# Patient Record
Sex: Female | Born: 1950
Health system: Southern US, Community
[De-identification: ages and names within clinical notes are randomized; demographics above are authoritative.]

## PROBLEM LIST (undated history)

## (undated) DIAGNOSIS — D649 Anemia, unspecified: Secondary | ICD-10-CM

## (undated) DIAGNOSIS — Z923 Personal history of irradiation: Secondary | ICD-10-CM

## (undated) DIAGNOSIS — G4730 Sleep apnea, unspecified: Secondary | ICD-10-CM

## (undated) DIAGNOSIS — R0602 Shortness of breath: Secondary | ICD-10-CM

## (undated) DIAGNOSIS — K449 Diaphragmatic hernia without obstruction or gangrene: Secondary | ICD-10-CM

## (undated) DIAGNOSIS — K219 Gastro-esophageal reflux disease without esophagitis: Secondary | ICD-10-CM

## (undated) DIAGNOSIS — J45909 Unspecified asthma, uncomplicated: Secondary | ICD-10-CM

## (undated) DIAGNOSIS — C50919 Malignant neoplasm of unspecified site of unspecified female breast: Secondary | ICD-10-CM

## (undated) DIAGNOSIS — M1990 Unspecified osteoarthritis, unspecified site: Secondary | ICD-10-CM

## (undated) DIAGNOSIS — C801 Malignant (primary) neoplasm, unspecified: Secondary | ICD-10-CM

## (undated) DIAGNOSIS — Z5189 Encounter for other specified aftercare: Secondary | ICD-10-CM

## (undated) DIAGNOSIS — E7800 Pure hypercholesterolemia, unspecified: Secondary | ICD-10-CM

## (undated) DIAGNOSIS — J3489 Other specified disorders of nose and nasal sinuses: Secondary | ICD-10-CM

## (undated) DIAGNOSIS — E559 Vitamin D deficiency, unspecified: Secondary | ICD-10-CM

## (undated) DIAGNOSIS — N189 Chronic kidney disease, unspecified: Secondary | ICD-10-CM

## (undated) DIAGNOSIS — Z79811 Long term (current) use of aromatase inhibitors: Secondary | ICD-10-CM

## (undated) DIAGNOSIS — D509 Iron deficiency anemia, unspecified: Secondary | ICD-10-CM

## (undated) DIAGNOSIS — R9431 Abnormal electrocardiogram [ECG] [EKG]: Secondary | ICD-10-CM

## (undated) DIAGNOSIS — M7989 Other specified soft tissue disorders: Secondary | ICD-10-CM

## (undated) DIAGNOSIS — R001 Bradycardia, unspecified: Secondary | ICD-10-CM

## (undated) DIAGNOSIS — M2550 Pain in unspecified joint: Secondary | ICD-10-CM

## (undated) DIAGNOSIS — I10 Essential (primary) hypertension: Secondary | ICD-10-CM

## (undated) DIAGNOSIS — M549 Dorsalgia, unspecified: Secondary | ICD-10-CM

## (undated) DIAGNOSIS — R609 Edema, unspecified: Secondary | ICD-10-CM

## (undated) DIAGNOSIS — R5383 Other fatigue: Secondary | ICD-10-CM

## (undated) DIAGNOSIS — R0600 Dyspnea, unspecified: Secondary | ICD-10-CM

## (undated) HISTORY — PX: BREAST LUMPECTOMY: SHX2

## (undated) HISTORY — DX: Morbid (severe) obesity due to excess calories: E66.01

## (undated) HISTORY — DX: Shortness of breath: R06.02

## (undated) HISTORY — DX: Dorsalgia, unspecified: M54.9

## (undated) HISTORY — DX: Iron deficiency anemia, unspecified: D50.9

## (undated) HISTORY — DX: Diaphragmatic hernia without obstruction or gangrene: K44.9

## (undated) HISTORY — DX: Sleep apnea, unspecified: G47.30

## (undated) HISTORY — DX: Malignant neoplasm of unspecified site of unspecified female breast: C50.919

## (undated) HISTORY — DX: Other specified soft tissue disorders: M79.89

## (undated) HISTORY — DX: Abnormal electrocardiogram (ECG) (EKG): R94.31

## (undated) HISTORY — DX: Other fatigue: R53.83

## (undated) HISTORY — DX: Unspecified asthma, uncomplicated: J45.909

## (undated) HISTORY — DX: Bradycardia, unspecified: R00.1

## (undated) HISTORY — DX: Pure hypercholesterolemia, unspecified: E78.00

## (undated) HISTORY — DX: Vitamin D deficiency, unspecified: E55.9

## (undated) HISTORY — DX: Anemia, unspecified: D64.9

## (undated) HISTORY — DX: Encounter for other specified aftercare: Z51.89

## (undated) HISTORY — DX: Dyspnea, unspecified: R06.00

## (undated) HISTORY — DX: Pain in unspecified joint: M25.50

## (undated) HISTORY — PX: BREAST EXCISIONAL BIOPSY: SUR124

## (undated) HISTORY — DX: Gastro-esophageal reflux disease without esophagitis: K21.9

## (undated) HISTORY — DX: Edema, unspecified: R60.9

## (undated) HISTORY — DX: Essential (primary) hypertension: I10

---

## 1997-04-28 HISTORY — PX: BREAST LUMPECTOMY: SHX2

## 2000-01-21 ENCOUNTER — Other Ambulatory Visit: Admission: RE | Admit: 2000-01-21 | Discharge: 2000-01-21 | Payer: Self-pay | Admitting: Family Medicine

## 2009-05-24 ENCOUNTER — Encounter: Admission: RE | Admit: 2009-05-24 | Discharge: 2009-05-24 | Payer: Self-pay | Admitting: Internal Medicine

## 2011-04-08 ENCOUNTER — Emergency Department (HOSPITAL_COMMUNITY): Payer: BC Managed Care – PPO

## 2011-04-08 ENCOUNTER — Inpatient Hospital Stay (HOSPITAL_COMMUNITY)
Admission: EM | Admit: 2011-04-08 | Discharge: 2011-04-11 | DRG: 247 | Disposition: A | Discharge status: Home / Self Care | Payer: BC Managed Care – PPO | Attending: Internal Medicine | Admitting: Internal Medicine

## 2011-04-08 DIAGNOSIS — I517 Cardiomegaly: Secondary | ICD-10-CM

## 2011-04-08 DIAGNOSIS — K449 Diaphragmatic hernia without obstruction or gangrene: Secondary | ICD-10-CM | POA: Diagnosis present

## 2011-04-08 DIAGNOSIS — R079 Chest pain, unspecified: Secondary | ICD-10-CM

## 2011-04-08 DIAGNOSIS — I498 Other specified cardiac arrhythmias: Secondary | ICD-10-CM | POA: Diagnosis present

## 2011-04-08 DIAGNOSIS — E119 Type 2 diabetes mellitus without complications: Secondary | ICD-10-CM | POA: Diagnosis present

## 2011-04-08 DIAGNOSIS — K219 Gastro-esophageal reflux disease without esophagitis: Secondary | ICD-10-CM | POA: Diagnosis present

## 2011-04-08 DIAGNOSIS — M25519 Pain in unspecified shoulder: Principal | ICD-10-CM | POA: Diagnosis present

## 2011-04-08 DIAGNOSIS — I10 Essential (primary) hypertension: Secondary | ICD-10-CM | POA: Diagnosis present

## 2011-04-08 DIAGNOSIS — D509 Iron deficiency anemia, unspecified: Secondary | ICD-10-CM | POA: Diagnosis present

## 2011-04-08 LAB — CBC
MCH: 17.3 pg — ABNORMAL LOW (ref 26.0–34.0)
MCHC: 28.1 g/dL — ABNORMAL LOW (ref 30.0–36.0)
Platelets: 357 10*3/uL (ref 150–400)
RDW: 20.4 % — ABNORMAL HIGH (ref 11.5–15.5)

## 2011-04-08 LAB — CARDIAC PANEL(CRET KIN+CKTOT+MB+TROPI)
CK, MB: 2.3 ng/mL (ref 0.3–4.0)
Total CK: 126 U/L (ref 7–177)
Troponin I: <0.3 ng/mL (ref <0.30)

## 2011-04-08 LAB — DIFFERENTIAL
Basophils Absolute: 0 10*3/uL (ref 0.0–0.1)
Lymphs Abs: 0.9 10*3/uL (ref 0.7–4.0)
Monocytes Absolute: 0.5 10*3/uL (ref 0.1–1.0)
Neutrophils Relative %: 85 % — ABNORMAL HIGH (ref 43–77)

## 2011-04-08 LAB — COMPREHENSIVE METABOLIC PANEL
ALT: 14 U/L (ref 0–35)
Alkaline Phosphatase: 63 U/L (ref 39–117)
BUN: 19 mg/dL (ref 6–23)
CO2: 28 mEq/L (ref 19–32)
Calcium: 9 mg/dL (ref 8.4–10.5)
GFR calc Af Amer: >60 mL/min (ref >60)
GFR calc non Af Amer: 51 mL/min — ABNORMAL LOW (ref >60)
Glucose, Bld: 142 mg/dL — ABNORMAL HIGH (ref 70–99)
Sodium: 138 mEq/L (ref 135–145)
Total Protein: 7.2 g/dL (ref 6.0–8.3)

## 2011-04-08 LAB — HEMOGLOBIN: Hemoglobin: 7.2 g/dL — ABNORMAL LOW (ref 12.0–15.0)

## 2011-04-08 LAB — PROTIME-INR: Prothrombin Time: 14.1 seconds (ref 11.6–15.2)

## 2011-04-08 LAB — CK TOTAL AND CKMB (NOT AT ARMC)
CK, MB: 2.3 ng/mL (ref 0.3–4.0)
Total CK: 132 U/L (ref 7–177)

## 2011-04-08 LAB — OCCULT BLOOD, POC DEVICE: Fecal Occult Bld: NEGATIVE

## 2011-04-09 LAB — BASIC METABOLIC PANEL
BUN: 15 mg/dL (ref 6–23)
Chloride: 102 mEq/L (ref 96–112)
GFR calc Af Amer: >60 mL/min (ref >60)
Potassium: 3.8 mEq/L (ref 3.5–5.1)

## 2011-04-09 LAB — CARDIAC PANEL(CRET KIN+CKTOT+MB+TROPI)
Relative Index: 1.6 (ref 0.0–2.5)
Troponin I: <0.3 ng/mL (ref <0.30)

## 2011-04-09 LAB — IRON AND TIBC
Saturation Ratios: 3 % — ABNORMAL LOW (ref 20–55)
TIBC: 401 ug/dL (ref 250–470)
UIBC: 390 ug/dL

## 2011-04-09 LAB — TSH: TSH: 1.06 u[IU]/mL (ref 0.350–4.500)

## 2011-04-09 LAB — LIPID PANEL
Cholesterol: 172 mg/dL (ref 0–200)
Total CHOL/HDL Ratio: 3.4 RATIO
Triglycerides: 76 mg/dL (ref <150)
VLDL: 15 mg/dL (ref 0–40)

## 2011-04-09 LAB — CBC
Hemoglobin: 9.6 g/dL — ABNORMAL LOW (ref 12.0–15.0)
MCH: 19.7 pg — ABNORMAL LOW (ref 26.0–34.0)
MCHC: 30.7 g/dL (ref 30.0–36.0)

## 2011-04-09 LAB — GLUCOSE, CAPILLARY: Glucose-Capillary: 152 mg/dL — ABNORMAL HIGH (ref 70–99)

## 2011-04-09 LAB — HEMOGLOBIN A1C: Mean Plasma Glucose: 157 mg/dL — ABNORMAL HIGH (ref <117)

## 2011-04-10 LAB — CROSSMATCH: Unit division: 0

## 2011-04-10 LAB — HEMOGLOBINOPATHY EVALUATION
Hgb A2 Quant: 2 % — ABNORMAL LOW (ref 2.2–3.2)
Hgb F Quant: 0 % (ref 0.0–2.0)
Hgb S Quant: 0 % (ref 0.0–0.0)

## 2011-04-10 LAB — CBC
Hemoglobin: 9.2 g/dL — ABNORMAL LOW (ref 12.0–15.0)
MCH: 19.6 pg — ABNORMAL LOW (ref 26.0–34.0)
Platelets: 304 10*3/uL (ref 150–400)
RBC: 4.69 MIL/uL (ref 3.87–5.11)
WBC: 6.8 10*3/uL (ref 4.0–10.5)

## 2011-04-10 LAB — GLUCOSE, CAPILLARY
Glucose-Capillary: 99 mg/dL (ref 70–99)
Glucose-Capillary: 103 mg/dL — ABNORMAL HIGH (ref 70–99)

## 2011-04-11 LAB — CBC
HCT: 31.7 % — ABNORMAL LOW (ref 36.0–46.0)
Hemoglobin: 9.4 g/dL — ABNORMAL LOW (ref 12.0–15.0)
MCV: 64.3 fL — ABNORMAL LOW (ref 78.0–100.0)
RBC: 4.93 MIL/uL (ref 3.87–5.11)
RDW: 23.8 % — ABNORMAL HIGH (ref 11.5–15.5)
WBC: 6.7 10*3/uL (ref 4.0–10.5)

## 2011-04-11 LAB — GLUCOSE, CAPILLARY: Glucose-Capillary: 95 mg/dL (ref 70–99)

## 2011-04-11 LAB — COMPREHENSIVE METABOLIC PANEL
Albumin: 2.9 g/dL — ABNORMAL LOW (ref 3.5–5.2)
Alkaline Phosphatase: 61 U/L (ref 39–117)
BUN: 12 mg/dL (ref 6–23)
CO2: 27 mEq/L (ref 19–32)
Chloride: 102 mEq/L (ref 96–112)
Creatinine, Ser: 1.09 mg/dL (ref 0.50–1.10)
GFR calc Af Amer: >60 mL/min (ref >60)
GFR calc non Af Amer: 51 mL/min — ABNORMAL LOW (ref >60)
Glucose, Bld: 95 mg/dL (ref 70–99)
Potassium: 3.6 mEq/L (ref 3.5–5.1)
Total Bilirubin: 0.3 mg/dL (ref 0.3–1.2)

## 2011-04-11 NOTE — Discharge Summary (Signed)
NAMESYDNE, KRAHL NO.:  [id number]  MEDICAL RECORD NO.:  [id number]  LOCATION:  1440                         FACILITY:  [facility]  PHYSICIAN:  Andreas Blower, MD       DATE OF BIRTH:  May 30, 1951  DATE OF ADMISSION:  04/08/2011 DATE OF DISCHARGE:                              DISCHARGE SUMMARY   PRIMARY CARE PHYSICIAN:  Robyn N. Allyne Gee, M.D.  CARDIOLOGIST:  Arturo Morton. Riley Kill, MD, [facility]  GASTROENTEROLOGIST:  Llana Aliment. Randa Evens, M.D.  DISCHARGE DIAGNOSES: 1. Left shoulder pain, likely musculoskeletal. 2. Chest pain. 3. Bradycardia. 4. Iron-deficiency anemia. 5. Large hiatal hernia. 6. Type 2 diabetes. 7. Hypertension. 8. Morbid obesity. 9. Gastroesophageal reflux disease.  DISCHARGE MEDICATIONS: 1. Aspirin 81 mg p.o. daily 2. Iron complex 150 mg p.o. twice daily. 3. Pioglitazone/metformin 15/850 mg 1 tablet p.o. twice daily. 4. Amlodipine/benazepril 10/20 p.o. daily. 5. Hydrochlorothiazide 25 mg p.o. daily. 6. Mucinex 1 tablet p.o. q.4h. as needed. 7. Lansoprazole 50 mg p.o. daily. 8. Zyrtec 10 mg p.o. daily as needed.  BRIEF ADMITTING HISTORY AND PHYSICAL:  Ms. Melamed is a 60 year old African-American female with history of diabetes, hypertension, who presented with complaints of left shoulder pain and was found to be profoundly anemic.  It was thought that the patient's left shoulder pain was probable anginal equivalent.  RADIOLOGY AND IMAGING: 1. The patient had chest x-ray two-view which showed enlarged cardiac     silhouette.  Perihilar and bibasilar heterogenous opacities,     probably atelectasis.  Possible hiatal hernia. 2. The patient had a 2-D echocardiogram which showed left ventricle     cavity size was normal, wall thickness was normal, systolic     function was normal with ejection fraction of 55% to 60%.  Wall     motion was normal.  There were no regional wall motion     abnormalities.  Doppler parameters were consistent with  abnormal     left ventricular relaxation (grade 1 diastolic dysfunction).     Aortic valve showed no stenosis.  Mitral valve showed calcified     annulus.  No significant regurgitation.  Left atrium was mildly     dilated.  LABORATORY DATA:  CBC shows a white count of white count of 6.7, hemoglobin 9.4, hematocrit 31.7, platelet count 286,000.  Electrolytes normal with a BUN of 12, creatinine 1.09.  Liver function tests normal. Albumin is 2.9.  Troponins negative x3.  Hemoglobin A1c 7.1, LDL 106. Serum iron 11, ferritin 6.  Fecal occult was negative.  HOSPITAL COURSE BY PROBLEM: 1. Left shoulder pain, likely musculoskeletal, resolved during the     course of hospital stay. 2. Chest pain, thought to be anginal equivalent from the left shoulder     pain, resolved during the course of hospital stay.  The patient was     ruled out for acute coronary syndrome.  [facility]     evaluated the patient during the course of hospital stay.     Recommended that the patient could follow up with Dr. Riley Kill with     [facility] as an outpatient for consideration of possible     stress test.  3. Bradycardia during the course of the night, it was noted that the     patient had a sinus bradycardia while sleeping, likely driven by     sleep apnea.  No further therapy or workup recommended at this     time by cardiology.  The patient may benefit from outpatient     sleep study.  Will help try to arrange for that as outpatient.     If not, will defer to her primary care physician to help arrange     for the sleep study. 4. Morbid obesity.  Diet and exercise as an outpatient.  Will get     outpatient sleep study done. 5. Iron-deficiency anemia.  Will start the patient on supplemental     iron.  The patient's iron-deficiency anemia is likely due to hiatal     hernia, which is causing poor absorption of iron.  Dr. Randa Evens from     GI recommended giving her IV iron.  The patient was given  Feraheme     150 mg IV x1 on April 10, 2011.  The patient was instructed to     follow up with Dr. Randa Evens for another round of IV iron to be done     as an outpatient.  Given the severe iron-deficiency anemia, the     patient received 2 units of PRBC.  She also had EGD and colonoscopy     which showed hiatal hernia and colonoscopy was normal. 6. Type 2 diabetes.  The patient will resume home medications at     discharge and will be on a diabetic diet. 7. GERD.  We will continue the patient on PPI schedule.  DISPOSITION AND FOLLOWUP:  The patient to follow up with Dr. Riley Kill, Cardiology, in 1 week for consideration of possible outpatient stress test.  The patient to follow with Dr. Randa Evens in 1 to 2 weeks for further management of anemia and to receive IV iron.  Time spent on discharge, talking to the patient, coordinating care was 25 minutes.   Andreas Blower, MD   SR/MEDQ  D:  04/11/2011  T:  04/11/2011  Job:  [id number]  Electronically Signed by Wardell Heath Akyla Vavrek  on 04/11/2011 03:40:26 PM

## 2011-04-15 ENCOUNTER — Telehealth: Payer: Self-pay | Admitting: Cardiology

## 2011-04-15 NOTE — Telephone Encounter (Signed)
Patient states she was seen in the ER last week, Dr. Riley Kill suggested then for pt to have a stress test. Patient would like to know when Md wants for her to have it done.

## 2011-04-15 NOTE — Telephone Encounter (Signed)
Pt seen in er last week, said dr Riley Kill suggested a stress test, wants to know when he wants it?

## 2011-04-15 NOTE — Telephone Encounter (Signed)
Dr Riley Kill and [name]--ms steveson is new pt dr Riley Kill saw in consult in hosp--d/c summary states f/u in 1 week with dr Riley Kill and probable out patient stress test --could not find spot for o.v. until 8/6--is this soon enough?--pt cannot come on tues or wed--if problem please let me know--thanks nt

## 2011-04-16 NOTE — Telephone Encounter (Signed)
Spoke with patient, she is feeling fine. She has an appointment to see Dr. Riley Kill 05/04/11. Advised her to contact our office if she has any symptoms before then.

## 2011-04-22 ENCOUNTER — Ambulatory Visit: Payer: BC Managed Care – PPO | Admitting: Cardiology

## 2011-04-25 NOTE — H&P (Signed)
NAME:  [name], LOS NO.:  [id number]  MEDICAL RECORD NO.:  [id number]  LOCATION:  WLED                         FACILITY:  [facility]  PHYSICIAN:  Houston Siren, MD           DATE OF BIRTH:  Jul 01, 1951  DATE OF ADMISSION:  04/08/2011 DATE OF DISCHARGE:                             HISTORY & PHYSICAL   PRIMARY CARE PHYSICIAN:  Merlene Laughter. Renae Gloss, MD  ADVANCE DIRECTIVES:  Full code.  REASON FOR ADMISSION:  Left shoulder pain, possibly angina equivalent, and profound anemia.  HISTORY OF PRESENT ILLNESS:  This is a 60 year old female with history of diabetes, hypertension, but otherwise healthy,  presents to the emergency room with 2 days of left shoulder pain, nausea, and diaphoresis.  She stated movement of her left shoulder did not aggravate or improve her pain.  She specifically denies any substernal chest pain, fever or chills.  There has been no reported trauma to her shoulder and this has not been a chronic problem.  Evaluation in the emergency room showed EKG without any acute ST-T changes and negative cardiac markers. She was incidentally found to have microcytic anemia with hemoglobin of 7.3 g/dL.  Her chest x-ray showed cardiomegaly.  It should be noted that she is menopausal, had no nosebleed or hemorrhoidal bleed.  She has no bright red blood per rectum.  She never had a colonoscopy.  She further has normal white count and normal platelet count.  She did admit to having some dark stool but not to the melanotic degree.  She denied being on aspirin or Pepto-Bismol.  Her creatinine is normal as well at 1.09.  She was seen in consultation with Cardiology who deferred to Medicine admission because of her anemia.  She also has spontaneous resolution of her left shoulder pain at this time.  She has no abdominal pain or epigastric pain.  PAST MEDICAL HISTORY:  Hypertension, diabetes, obesity.  ALLERGIES:  Unclear.  CURRENT MEDICATIONS:  Actos and  Norvasc.  FAMILY HISTORY:  Significant for father with MI at age 76.  Negative for any family history of colon cancer.  SOCIAL HISTORY:  She works in [employer].  She is married and has a child.  PHYSICAL EXAMINATION:  VITAL SIGNS:  Blood pressure 128/75, heart rate of 90, respiratory rate 22, temperature 98.0. GENERAL:  She is alert and oriented and is in no apparent distress. HEENT AND NECK:  Sclerae are nonicteric.  Her subconjunctiva is pale, consistent with true anemia.  Pupils equal, round, reactive.  Neck is supple.  Throat is clear.  Tongue is midline.  Uvula elevates with phonation.  She has no carotid bruit. HEART:  S1, S2 regular.  I did not hear any murmur, rub or gallop. LUNGS:  Clear.  No evidence of consolidation or heart failure. ABDOMEN:  Soft, obese, nondistended, nontender.  No right upper quadrant pain.  Bowel sounds present.  Difficult to evaluate organomegaly. EXTREMITIES:  No edema.  Good distal pulses bilaterally.  No calf tenderness.  No peripheral stigmata of liver disease.  NEUROLOGICAL and PSYCHIATRIC:  Unremarkable.  OBJECTIVE FINDINGS:  EKG showed normal sinus rhythm with left  axis deviation, no acute ST-T changes.  Chest x-ray showed enlarged cardiac silhouette but otherwise negative. Creatinine of 1.09.  Blood glucose of 142.  Troponin less than 0.3.  INR of 1.07.  White count of 9.0, hemoglobin of 7.3 g/dL, MCV of 62, and platelet count of 357,000.  Occult blood negative.  IMPRESSION AND PLAN: 1. This is a 60 year old female presenting with left shoulder pain     that could represent ischemia or anginal equivalent in the setting     of profound microcytic anemia with hemoglobin of 7.3.  She will     need blood transfusion, and I will give her 2 units of packed red     blood cells over 4 hours each.  We need to completely rule out with     serial CPKs and troponins.  Will get an echo of her heart. 2. For her anemia, she likely  will need a full GI workup.  In the     interim, will get reticulocyte count along with iron, TIBC,     ferritin, and I also would like to get a hemoglobin electrophoresis     to rule out microcytic anemia of hereditary causes. 3. She is stable and full code.  Will be admitted to [facility]     1.  Cardiology has seen her and will follow along as well. 4. For her diabetes, we will continue her medications and will check     her blood glucose a.c. and h.s.     Houston Siren, MD     PL/MEDQ  D:  04/08/2011  T:  04/08/2011  Job:  [id number]  Electronically Signed by Houston Siren  on 04/25/2011 02:58:52 AM

## 2011-04-30 NOTE — Op Note (Signed)
  [name], [name]                 ACCOUNT NO.:  [id number]  MEDICAL RECORD NO.:  [id number]  LOCATION:  1440                         FACILITY:  [facility]  PHYSICIAN:  Taira Knabe L. Malon Kindle., M.D.DATE OF BIRTH:  03-18-1951  DATE OF PROCEDURE:  04/10/2011 DATE OF DISCHARGE:                              OPERATIVE REPORT   PROCEDURE:  Colonoscopy.  MEDICATIONS:  She received total of fentanyl 125 mcg and Versed 7 mg for both procedures.  INDICATION:  Profound iron-deficiency anemia.  DESCRIPTION OF PROCEDURE:  Procedure explained to the patient, and consent obtained.  After the endoscopy was performed, the patient was turned around, and the Pentax adult colonoscope was inserted and advanced.  The prep was excellent, we were able to reach the cecum and entered the terminal ileum for distance of approximately 5 cm and this was normal.  The scope was withdrawn; and the cecum, ascending, transverse, descending, and sigmoid colon were seen well.  Minimal diverticular disease.  No polyps, cancers, or any other significant lesions.  The scope was retroflexed and was normal.  Scope was withdrawn.  The patient tolerated the procedure well.  There were no immediate problems.  ASSESSMENT:  Essentially normal colonoscopy, suspect the patient is iron deficient because of a large hiatal hernia.  PLAN:  Will recommend iron replacement therapy and routine colon screening followup.          ______________________________ Llana Aliment. Malon Kindle., M.D.     Waldron Session  D:  04/10/2011  T:  04/10/2011  Job:  [id number]  cc:   Merlene Laughter. Renae Gloss, M.D. Fax: [phone]  Electronically Signed by Carman Ching M.D. on 04/30/2011 02:25:31 PM

## 2011-04-30 NOTE — Consult Note (Signed)
[name], [name]                 ACCOUNT NO.:  [id number]  MEDICAL RECORD NO.:  [id number]  LOCATION:  1440                         FACILITY:  [facility]  PHYSICIAN:  Ayaat Jansma L. Malon Kindle., M.D.DATE OF BIRTH:  [birth year]  DATE OF CONSULTATION:  04/18/2011 DATE OF DISCHARGE:                                CONSULTATION   Gastroenterology Consultation  REASON FOR CONSULTATION:  Profound anemia.  PRIMARY CARE DOCTOR:  Merlene Laughter. Renae Gloss, M.D.  PRIMARY GASTROENTEROLOGIST:  None.  Patient is unassigned.  HISTORY OF PRESENT ILLNESS:  [age]-year-old, who came in with some vague shoulder pain that could have been angina that resolved fairly quickly.  EKG and cardiac enzymes were normal.  Cardiology did not feel this was a cardiac event.  The patient was found on her initial workup to be profoundly anemic with a hemoglobin of 7.3 and MCV of 61.  Her other labs were pretty much unremarkable and her stools were occult blood negative.  She had a low retic count and iron was profoundly low at 11, 3% saturation.  Her ferritin was low at 6.  Patient has never had either endoscopy or colonoscopy.  She denies any known rectal bleeding. She has no family history of colon cancer, but does have a brother who had ulcers.  She has had some recent heartburn for which she took Prevacid intermittently, this has been going on for 6 months or so ago and when it bothers, she takes Prevacid over-the-counter, which resolves her symptoms.  She has had 2 units of blood and her hemoglobin has come up to 9.6.  MEDICATIONS ON ADMISSION: 1. Norvasc. 2. Actos.  Patient adamantly denies any NSAIDs.  ALLERGIES:  None known.  PAST MEDICAL HISTORY: 1. Obesity. 2. Hypertension. 3. Type 2 diabetes.  PAST SURGICAL HISTORY:  None.  FAMILY HISTORY:  Negative for colon cancer.  Brother had ulcers.  Her father had an MI at age 33.  REVIEW OF SYSTEMS:  She is postmenopausal and never had heavy periods. She  has had no periods now for 2-3 years.  SOCIAL HISTORY:  She is married, works at an [employer] and lives here in the area with her husband.  They have one child.  PHYSICAL EXAMINATION:  VITAL SIGNS:  Temperature 98.1, blood pressure 162/84. GENERAL:  An obese African American female, in no acute distress. EYES:  Sclerae nonicteric. NECK:  Supple.  No lymphadenopathy. LUNGS:  Clear. HEART:  Regular rate and rhythm.  No murmurs, rubs or gallops. ABDOMEN:  Obese and nontender.  ASSESSMENT: 1. Profound iron-deficiency of unclear cause:  Her low iron level and     ferritin indicate that this is a chronic process.  She is not     currently having any active bleeding.  Her stools are negative, but     her labs suggest this has been going on for some time.  I think she     will need both an upper and lower endoscopy. 2. Elevated blood pressure. 3. Type 2 diabetes. 4. Increased cholesterol.  PLAN:  We will go ahead with EGD and colonoscopy tomorrow.  We have gone over this with the patient and her husband including  the possibility of ulcers, colon polyps, cancers, etc.,  Procedure was discussed and questions answered.          ______________________________ Llana Aliment Malon Kindle., M.D.     Waldron Session  D:  04/09/2011  T:  04/09/2011  Job:  [id number]  cc:   Merlene Laughter. Renae Gloss, M.D. Fax: [phone]  Llana Aliment. Malon Kindle., M.D. Fax: [phone]  Electronically Signed by Carman Ching M.D. on 04/30/2011 02:25:14 PM

## 2011-04-30 NOTE — Op Note (Signed)
  NAMEJAZZMAN, LOUGHMILLER                 ACCOUNT NO.:  [id number]  MEDICAL RECORD NO.:  [id number]  LOCATION:  1440                         FACILITY:  [facility]  PHYSICIAN:  Fordyce Lepak L. Malon Kindle., M.D.DATE OF BIRTH:  03-17-1951  DATE OF PROCEDURE:  04/10/2011 DATE OF DISCHARGE:                              OPERATIVE REPORT   PROCEDURE:  Esophagogastroduodenoscopy.  MEDICATIONS:  Cetacaine spray, fentanyl 62.5 mcg, and Versed 5 mg IV.  INDICATIONS:  This 60 year old who presented with profound anemia and a very low serum iron, but heme-negative stools.  Her iron saturation was 3%, and her hemoglobin on admission was 7.3.  DESCRIPTION OF PROCEDURE:  Procedure had been explained to the patient, and consent obtained.  On the left lateral decubitus position, the Pentax upper scope was inserted and advanced.  The esophagus was entered, there was no esophagitis or active GI bleeding.  Z-line was seen at approximately 33 cm, and the diaphragm was seen at 40 cm.  The patient does have a 7-cm hiatal hernia.  The scope was passed in the stomach, and the stomach carefully examined in forward and retroflexed view.  There were no polyps, ulcers, signs of ongoing GI bleeding, and this was completely normal.  Pyloric channel was seen and passed, and this was completely normal as well.  The scope was withdrawn, the patient tolerated the procedure well.  ASSESSMENT: 1. Large hiatal hernia, measuring approximately 7 cm in length with     free reflux.  This is probably the source of the patient's iron     loss, we will need to check out the colon.  PLAN:  Would go ahead and proceed with colonoscopy at this time as planned and would treat for reflux.  She will likely need IV iron replacement.          ______________________________ Llana Aliment. Malon Kindle., M.D.     Waldron Session  D:  04/10/2011  T:  04/10/2011  Job:  [id number]  cc:   Merlene Laughter. Renae Gloss, M.D. Fax: [phone]  Electronically Signed by Carman Ching M.D. on 04/30/2011 02:25:22 PM

## 2011-05-01 ENCOUNTER — Encounter: Payer: Self-pay | Admitting: Cardiology

## 2011-05-01 ENCOUNTER — Encounter (HOSPITAL_COMMUNITY): Payer: BC Managed Care – PPO | Attending: Gastroenterology

## 2011-05-01 DIAGNOSIS — D509 Iron deficiency anemia, unspecified: Secondary | ICD-10-CM | POA: Insufficient documentation

## 2011-05-04 ENCOUNTER — Ambulatory Visit: Payer: BC Managed Care – PPO | Admitting: Cardiology

## 2011-05-14 ENCOUNTER — Ambulatory Visit (INDEPENDENT_AMBULATORY_CARE_PROVIDER_SITE_OTHER): Payer: BC Managed Care – PPO | Admitting: Cardiology

## 2011-05-14 VITALS — BP 128/84 | Ht 62.0 in | Wt 237.0 lb

## 2011-05-14 DIAGNOSIS — R0789 Other chest pain: Secondary | ICD-10-CM

## 2011-05-14 DIAGNOSIS — R079 Chest pain, unspecified: Secondary | ICD-10-CM

## 2011-05-14 DIAGNOSIS — Z136 Encounter for screening for cardiovascular disorders: Secondary | ICD-10-CM

## 2011-05-14 DIAGNOSIS — I10 Essential (primary) hypertension: Secondary | ICD-10-CM

## 2011-05-14 DIAGNOSIS — D509 Iron deficiency anemia, unspecified: Secondary | ICD-10-CM

## 2011-05-14 LAB — BASIC METABOLIC PANEL
CO2: 29 mEq/L (ref 19–32)
Calcium: 9.6 mg/dL (ref 8.4–10.5)
Creatinine, Ser: 1.2 mg/dL (ref 0.4–1.2)
GFR: 56.82 mL/min — ABNORMAL LOW (ref >60.00)
Sodium: 138 mEq/L (ref 135–145)

## 2011-05-14 NOTE — Patient Instructions (Signed)
Your physician has requested that you have an exercise tolerance test. For further information please visit [email or web address] Please also follow instruction sheet, as given with Dr. Riley Kill or Tereso Newcomer.

## 2011-05-14 NOTE — Progress Notes (Signed)
HPI:  Doing well.  No more shoulder discomfort.   She had some questionable chest discomfort, non now.  When she came in, she had some steady shoulder pain, and this was relieved by SL NTG.  She had nausea.  She has had none since.  Major finding was that of iron deficiency anemia.  Now much better.   Has not seen per primary MD yet, but has seen Dr. Randa Evens, and had another iron infusion, and apparently a CBC.  She has multiple cardiac risk factors, and she and I discussed relationship of BMI to her overall outcome.    Current Outpatient Prescriptions  Medication Sig Dispense Refill  . aspirin 81 MG tablet Take 81 mg by mouth daily.        . cetirizine (ZYRTEC) 10 MG chewable tablet Chew 10 mg by mouth daily.        . iron polysaccharides (NIFEREX) 150 MG capsule Take 150 mg by mouth 2 (two) times daily.        . lansoprazole (PREVACID) 30 MG capsule Take 30 mg by mouth daily.        . pioglitazone-metformin (ACTOPLUS MET) 15-850 MG per tablet Take 1 tablet by mouth 2 (two) times daily with a meal.          No Known Allergies  Past Medical History  Diagnosis Date  . Chest pain   . Bradycardia   . Iron deficiency anemia   . Hiatal hernia     large  . Shoulder pain, left   . Hypertension   . Diabetes mellitus     type 2  . Morbid obesity   . GERD (gastroesophageal reflux disease)     No past surgical history on file.  Family History  Problem Relation Age of Onset  . Heart attack Father     History   Social History  . Marital Status: Married    Spouse Name: N/A    Number of Children: 1  . Years of Education: N/A   Occupational History  .  [employer]   Social History Main Topics  . Smoking status: Not on file  . Smokeless tobacco: Not on file  . Alcohol Use: Not on file  . Drug Use: Not on file  . Sexually Active: Not on file   Other Topics Concern  . Not on file   Social History Narrative  . No narrative on file    ROS: Please see the HPI.   All other systems reviewed and negative.  PHYSICAL EXAM:  BP 128/84  Ht 5\' 2"  (1.575 m)  Wt 237 lb (107.502 kg)  BMI 43.35 kg/m2  General: Well developed, well nourished, in no acute distress. Head:  Normocephalic and atraumatic. Neck: no JVD Lungs: Clear to auscultation and percussion. Heart: Normal S1 and S2.  No murmur, rubs or gallops.  Pulses: Pulses normal in all 4 extremities. Extremities: No clubbing or cyanosis. No edema. Neurologic: Alert and oriented x 3.  EKG:  NSRS.  Left axis deviation.  Delay in R wave progression.    (Had echo in the hospital.   ASSESSMENT AND PLAN:

## 2011-05-21 ENCOUNTER — Telehealth: Payer: Self-pay | Admitting: Cardiology

## 2011-05-21 NOTE — Telephone Encounter (Signed)
Test results

## 2011-05-21 NOTE — Telephone Encounter (Signed)
Left a message informing patient that her blood work was normal.

## 2011-05-22 DIAGNOSIS — R079 Chest pain, unspecified: Secondary | ICD-10-CM | POA: Insufficient documentation

## 2011-05-22 DIAGNOSIS — D509 Iron deficiency anemia, unspecified: Secondary | ICD-10-CM | POA: Insufficient documentation

## 2011-05-22 DIAGNOSIS — I10 Essential (primary) hypertension: Secondary | ICD-10-CM | POA: Insufficient documentation

## 2011-05-22 DIAGNOSIS — I152 Hypertension secondary to endocrine disorders: Secondary | ICD-10-CM | POA: Insufficient documentation

## 2011-05-22 NOTE — Assessment & Plan Note (Addendum)
Recently admitted to the hospital with profound iron deficiency, and hemoglobin under 8.  Underwent work up by Dr. Randa Evens, with large hiatal hernia.  Is scheduled for Dr. Randa Evens, and will need follow up with Dr.Shelton.

## 2011-05-22 NOTE — Assessment & Plan Note (Signed)
Had some atypical symptoms in the setting of profound iron deficiency anemia.  Will go ahead with treadmill as she has multiple risk factors for CAD.

## 2011-05-22 NOTE — Assessment & Plan Note (Signed)
On triple drug therapy, so need to recheck BMET.

## 2011-06-18 ENCOUNTER — Encounter: Payer: BC Managed Care – PPO | Admitting: Physician Assistant

## 2011-12-24 ENCOUNTER — Other Ambulatory Visit: Payer: Self-pay | Admitting: Internal Medicine

## 2011-12-24 DIAGNOSIS — Z1231 Encounter for screening mammogram for malignant neoplasm of breast: Secondary | ICD-10-CM

## 2012-01-14 ENCOUNTER — Ambulatory Visit: Payer: BC Managed Care – PPO

## 2012-01-21 ENCOUNTER — Ambulatory Visit
Admission: RE | Admit: 2012-01-21 | Discharge: 2012-01-21 | Disposition: A | Discharge status: Home / Self Care | Payer: BC Managed Care – PPO | Source: Ambulatory Visit | Attending: Internal Medicine | Admitting: Internal Medicine

## 2012-01-21 DIAGNOSIS — Z1231 Encounter for screening mammogram for malignant neoplasm of breast: Secondary | ICD-10-CM

## 2012-01-26 ENCOUNTER — Other Ambulatory Visit: Payer: Self-pay | Admitting: Internal Medicine

## 2012-01-26 DIAGNOSIS — R928 Other abnormal and inconclusive findings on diagnostic imaging of breast: Secondary | ICD-10-CM

## 2012-02-01 HISTORY — PX: OTHER SURGICAL HISTORY: SHX169

## 2012-02-04 ENCOUNTER — Ambulatory Visit
Admission: RE | Admit: 2012-02-04 | Discharge: 2012-02-04 | Disposition: A | Discharge status: Home / Self Care | Payer: BC Managed Care – PPO | Source: Ambulatory Visit | Attending: Internal Medicine | Admitting: Internal Medicine

## 2012-02-04 ENCOUNTER — Other Ambulatory Visit: Payer: Self-pay | Admitting: Internal Medicine

## 2012-02-04 DIAGNOSIS — R928 Other abnormal and inconclusive findings on diagnostic imaging of breast: Secondary | ICD-10-CM

## 2012-02-11 ENCOUNTER — Ambulatory Visit
Admission: RE | Admit: 2012-02-11 | Discharge: 2012-02-11 | Disposition: A | Discharge status: Home / Self Care | Payer: BC Managed Care – PPO | Source: Ambulatory Visit | Attending: Internal Medicine | Admitting: Internal Medicine

## 2012-02-11 ENCOUNTER — Other Ambulatory Visit: Payer: Self-pay | Admitting: Internal Medicine

## 2012-02-11 DIAGNOSIS — R928 Other abnormal and inconclusive findings on diagnostic imaging of breast: Secondary | ICD-10-CM

## 2012-02-12 ENCOUNTER — Other Ambulatory Visit: Payer: Self-pay | Admitting: Internal Medicine

## 2012-02-12 DIAGNOSIS — C50911 Malignant neoplasm of unspecified site of right female breast: Secondary | ICD-10-CM

## 2012-02-19 ENCOUNTER — Ambulatory Visit
Admission: RE | Admit: 2012-02-19 | Discharge: 2012-02-19 | Disposition: A | Discharge status: Home / Self Care | Payer: BC Managed Care – PPO | Source: Ambulatory Visit | Attending: Internal Medicine | Admitting: Internal Medicine

## 2012-02-19 DIAGNOSIS — C50911 Malignant neoplasm of unspecified site of right female breast: Secondary | ICD-10-CM

## 2012-02-19 MED ORDER — GADOBENATE DIMEGLUMINE 529 MG/ML IV SOLN
20.0000 mL | Freq: Once | INTRAVENOUS | Status: AC | PRN
  Administered 2012-02-19: 20 mL via INTRAVENOUS

## 2012-02-24 ENCOUNTER — Encounter (INDEPENDENT_AMBULATORY_CARE_PROVIDER_SITE_OTHER): Payer: Self-pay | Admitting: Surgery

## 2012-02-24 ENCOUNTER — Encounter (INDEPENDENT_AMBULATORY_CARE_PROVIDER_SITE_OTHER): Payer: Self-pay | Admitting: General Surgery

## 2012-02-24 ENCOUNTER — Ambulatory Visit (INDEPENDENT_AMBULATORY_CARE_PROVIDER_SITE_OTHER): Payer: BC Managed Care – PPO | Admitting: Surgery

## 2012-02-24 VITALS — BP 152/96 | HR 100 | Temp 97.6°F | Resp 14 | Ht 62.5 in | Wt 233.6 lb

## 2012-02-24 DIAGNOSIS — D0511 Intraductal carcinoma in situ of right breast: Secondary | ICD-10-CM | POA: Insufficient documentation

## 2012-02-24 DIAGNOSIS — D0590 Unspecified type of carcinoma in situ of unspecified breast: Secondary | ICD-10-CM

## 2012-02-24 NOTE — Patient Instructions (Signed)
Once we have received cardiac clearance from Dr. Rosalyn Charters office, we will call to schedule surgery.

## 2012-02-24 NOTE — Progress Notes (Signed)
Patient ID: [name], female   DOB: 11/10/1950, 61 y.o.   MRN: [id number]  Chief Complaint  Patient presents with  . New Evaluation    New Br. Ca Pt. Rt Br DCIS    HPI [name] is a 61 y.o. female.  Referred by Dr Robyn Sanders HPI 61 yo female presents after recent routine screening mammogram. She was noted to have an abnormality in the right upper outer quadrant of her breast. She underwent further workup with repeat mammogram and ultrasound. She underwent core biopsy which showed DCIS. The prognostic profile is pending. MRI showed a 1.4 cm lesion with no sign of multicentric or contralateral disease. Lymph nodes appear to be negative. She presents now for surgical evaluation. She is alone today.   She was seen by Dr. Thomas Stucky at [facility] last year for some left shoulder pain. She has not had a stress test.Past Medical History  Diagnosis Date  . Chest pain   . Bradycardia   . Iron deficiency anemia   . Hiatal hernia     large  . Shoulder pain, left   . Hypertension   . Diabetes mellitus     type 2  . Morbid obesity   . GERD (gastroesophageal reflux disease)   . Blood transfusion     Past Surgical History  Procedure Date  . Breast lumpectomy 04/1997    Family History  Problem Relation Age of Onset  . Heart attack Father   . Heart disease Father     heart attack  . Cancer Sister     breast    Social History History  Substance Use Topics  . Smoking status: Never Smoker   . Smokeless tobacco: Never Used  . Alcohol Use: No    No Known Allergies  Current Outpatient Prescriptions  Medication Sig Dispense Refill  . cetirizine (ZYRTEC) 10 MG chewable tablet Chew 10 mg by mouth daily.        . iron polysaccharides (NIFEREX) 150 MG capsule Take 150 mg by mouth 2 (two) times daily.        . lansoprazole (PREVACID) 30 MG capsule Take 30 mg by mouth daily.        . Olmesartan-Amlodipine-HCTZ (TRIBENZOR) 40-10-25 MG TABS Take 1 tablet by mouth  daily.        . pioglitazone-metformin (ACTOPLUS MET) 15-850 MG per tablet Take 1 tablet by mouth 2 (two) times daily with a meal.        . aspirin 81 MG tablet Take 81 mg by mouth daily.          Review of Systems Review of Systems  Constitutional: Negative for fever, chills and unexpected weight change.  HENT: Positive for congestion. Negative for hearing loss, sore throat, trouble swallowing and voice change.   Eyes: Negative for visual disturbance.  Respiratory: Negative for cough and wheezing.   Cardiovascular: Positive for leg swelling. Negative for chest pain and palpitations.  Gastrointestinal: Negative for nausea, vomiting, abdominal pain, diarrhea, constipation, blood in stool, abdominal distention and anal bleeding.  Genitourinary: Negative for hematuria, vaginal bleeding and difficulty urinating.  Musculoskeletal: Negative for arthralgias.  Skin: Negative for rash and wound.  Neurological: Negative for seizures, syncope and headaches.  Hematological: Negative for adenopathy. Does not bruise/bleed easily.  Psychiatric/Behavioral: Negative for confusion.    Blood pressure 152/96, pulse 100, temperature 97.6 F (36.4 C), temperature source Temporal, resp. rate 14, height 5' 2.5" (1.588 m), weight 233 lb 9.6 oz (105.96 kg).    Physical Exam Physical Exam WDWN in NAD HEENT - [place]/AT; EOMI, sclera anicteric Lungs - CTA B; normal respiratory effort CV - RRR Abd - soft, non-tender; no masses Breasts - symmetric; no nipple retraction or discharge; no axillary lymphadenopathy on either side.  RUOQ - 10:00 about 3 cm outside the nipple - 1 cm palpable mass; non-tender Data Reviewed Mammogram/ U/S/ MRI  Assessment    Right breast DCIS - upper outer quadrant Prognostic profile pending    Plan    Right lumpectomy - needle-localized; right axillary sentinel lymph node biopsy in case this shows some invasive cancer.  The surgical procedure has been discussed with the patient.   Potential risks, benefits, alternative treatments, and expected outcomes have been explained.  All of the patient's questions at this time have been answered.  The likelihood of reaching the patient's treatment goal is good.  The patient understand the proposed surgical procedure and wishes to proceed.        Valerye Kobus K. 02/24/2012, 11:54 AM    

## 2012-02-26 ENCOUNTER — Encounter: Payer: Self-pay | Admitting: Physician Assistant

## 2012-02-26 ENCOUNTER — Ambulatory Visit (INDEPENDENT_AMBULATORY_CARE_PROVIDER_SITE_OTHER): Payer: BC Managed Care – PPO | Admitting: Physician Assistant

## 2012-02-26 VITALS — BP 132/80 | HR 99 | Ht 62.0 in | Wt 234.0 lb

## 2012-02-26 DIAGNOSIS — I10 Essential (primary) hypertension: Secondary | ICD-10-CM

## 2012-02-26 DIAGNOSIS — Z01810 Encounter for preprocedural cardiovascular examination: Secondary | ICD-10-CM

## 2012-02-26 NOTE — Patient Instructions (Signed)
Your physician has requested that you have an exercise tolerance test. For further information please visit [email or web address] Please also follow instruction sheet, as given. DX SURGERY CLEARANCE THIS NEEDS TO BE DONE NEXT WEEK PER SCOTT WEAVER, PAC THIS CAN BE DONE WITH EITHER ONE OF DR. Riley Kill, SCOTT WEAVER, PAC, CHRIS BERG, NP

## 2012-02-26 NOTE — Progress Notes (Signed)
[address] [place], [place]  [address] Phone: [phone] Fax:  [phone]  Date:  02/26/2012   Name:  [name]   DOB:  [birth year]   MRN:  [id number]  PCP:  Gwynneth Aliment, MD, MD  Primary Cardiologist:  Dr.  Shawnie Pons  Primary Electrophysiologist:  None    History of Present Illness: [name] is a 61 y.o. female who returns for surgical clearance.  She has a history of hypertension, diabetes, GERD, profound iron deficiency anemia (large HH noted on EGD 7/12).  She was seen by Dr. Riley Kill in consultation 03/2011 her chest and shoulder pain.  Echocardiogram 03/2011: EF 55-60%, grade 1 diastolic dysfunction, mild MAC, mild LAE.  She saw Dr. Riley Kill in followup 04/2011.  Exercise treadmill test was arranged at that time.  This was never performed.  She has a right breast DCIS and needs lumpectomy.  The patient denies chest pain, shortness of breath, syncope, orthopnea, PND or significant pedal edema.  She can achieve 4 METs without chest pain.  No further shoulder pain.    Wt Readings from Last 3 Encounters:  02/26/12 234 lb (106.142 kg)  02/24/12 233 lb 9.6 oz (105.96 kg)  05/14/11 237 lb (107.502 kg)     Potassium  Date/Time Value Range Status  05/14/2011 11:02 AM 3.6  3.5-5.1 (mEq/L) Final     Creatinine, Ser  Date/Time Value Range Status  05/14/2011 11:02 AM 1.2  0.4-1.2 (mg/dL) Final     ALT  Date/Time Value Range Status  04/11/2011  5:50 AM 13  0-35 (U/L) Final     TSH  Date/Time Value Range Status  04/08/2011 11:06 PM 1.060  0.350-4.500 (uIU/mL) Final     Hemoglobin  Date/Time Value Range Status  04/11/2011  5:50 AM 9.4* 12.0-15.0 (g/dL) Final    Past Medical History  Diagnosis Date  . Chest pain     Echocardiogram 03/2011: EF 55-60%, grade 1 diastolic dysfunction, mild MAC, mild LAE  . Bradycardia   . Iron deficiency anemia   . Hiatal hernia     large  . Hypertension   . Diabetes mellitus     type 2  . Morbid  obesity   . GERD (gastroesophageal reflux disease)   . Blood transfusion     Current Outpatient Prescriptions  Medication Sig Dispense Refill  . aspirin 81 MG tablet Take 81 mg by mouth daily.        . cetirizine (ZYRTEC) 10 MG chewable tablet Chew 10 mg by mouth daily.        . iron polysaccharides (NIFEREX) 150 MG capsule Take 150 mg by mouth 2 (two) times daily.        . lansoprazole (PREVACID) 30 MG capsule Take 30 mg by mouth daily.        . Olmesartan-Amlodipine-HCTZ (TRIBENZOR) 40-10-25 MG TABS Take 1 tablet by mouth daily.        . pioglitazone-metformin (ACTOPLUS MET) 15-850 MG per tablet Take 1 tablet by mouth 2 (two) times daily with a meal.          Allergies: No Known Allergies  History  Substance Use Topics  . Smoking status: Never Smoker   . Smokeless tobacco: Never Used  . Alcohol Use: No     Family History  Problem Relation Age of Onset  . Coronary artery disease Father     heart attack  . Cancer Sister     breast    ROS:  Please  see the history of present illness.    All other systems reviewed and negative.   PHYSICAL EXAM: VS:  BP 132/80  Pulse 99  Ht 5\' 2"  (1.575 m)  Wt 234 lb (106.142 kg)  BMI 42.80 kg/m2 Well nourished, well developed, in no acute distress HEENT: normal Neck: no JVD Vascular: no carotid bruits Cardiac:  normal S1, S2; RRR; no murmur Lungs:  clear to auscultation bilaterally, no wheezing, rhonchi or rales Abd: soft, nontender, no hepatomegaly Ext: no edema Skin: warm and dry Neuro:  CNs 2-12 intact, no focal abnormalities noted  EKG:  Sinus rhythm, heart rate 99, left axis deviation, no ischemic changes, no change from prior tracing   ASSESSMENT AND PLAN:  1.  Right Breast DCIS She has several risk factors for CAD.  Most notably, she has diabetes and a family history of premature CAD.  She was admitted to the hospital less than 12 months ago with chest discomfort and arm discomfort.  She was to have a follow up stress  test but this was never performed.  She is not having any unstable symptoms.  However, I recommend that we proceed with a plain exercise treadmill test for risk stratification.  This will be done as soon as possible.  As long as this is low risk, she can proceed with her breast surgery at acceptable risk.  If it is abnormal, she will need further evaluation.  2.  Hypertension Controlled.  Continue current therapy.   3.  Diabetes Mellitus Managed by primary care. Consider statin Rx - will defer to PCP.   Luna Glasgow, PA-C  8:43 AM 02/26/2012

## 2012-03-01 ENCOUNTER — Ambulatory Visit (INDEPENDENT_AMBULATORY_CARE_PROVIDER_SITE_OTHER): Payer: BC Managed Care – PPO | Admitting: Physician Assistant

## 2012-03-01 DIAGNOSIS — Z01810 Encounter for preprocedural cardiovascular examination: Secondary | ICD-10-CM

## 2012-03-01 DIAGNOSIS — R0602 Shortness of breath: Secondary | ICD-10-CM

## 2012-03-01 DIAGNOSIS — I10 Essential (primary) hypertension: Secondary | ICD-10-CM

## 2012-03-01 NOTE — Progress Notes (Signed)
Exercise Treadmill Test  Pre-Exercise Testing Evaluation Rhythm: sinus tachycardia  Rate: 107   PR:  .16 QRS:  .08  QT:  .35 QTc: .46            Test  Exercise Tolerance Test Ordering MD: Shawnie Pons, MD  Interpreting MD: Tereso Newcomer, [facility]  Unique Test No: 1  Treadmill:  1  Indication for ETT: exertional dyspnea  Contraindication to ETT: No   Stress Modality: exercise - treadmill  Cardiac Imaging Performed: non   Protocol: standard Bruce - maximal  Max BP:  172/68  Max MPHR (bpm):  157 85% MPR (bpm):  133  MPHR obtained (bpm):  162 % MPHR obtained:  103%  Reached 85% MPHR (min:sec):  2:55 Total Exercise Time (min-sec):  3:05  Workload in METS:  4.6 Borg Scale: 17  Reason ETT Terminated:  patient's desire to stop    ST Segment Analysis At Rest: normal ST segments - no evidence of significant ST depression With Exercise: no evidence of significant ST depression  Other Information Arrhythmia:  No Angina during ETT:  absent (0) Quality of ETT:  diagnostic  ETT Interpretation:  normal - no evidence of ischemia by ST analysis  Comments: Poor exercise tolerance. No chest pain. Normal BP response to exercise. No ST-T changes to suggest ischemia.   Recommendations: Low risk stress test. Ok to proceed with surgery at acceptable cardiovascular risk. Tereso Newcomer, PA-C  4:17 PM 03/01/2012

## 2012-03-07 ENCOUNTER — Other Ambulatory Visit (INDEPENDENT_AMBULATORY_CARE_PROVIDER_SITE_OTHER): Payer: Self-pay | Admitting: Surgery

## 2012-03-07 ENCOUNTER — Encounter: Payer: BC Managed Care – PPO | Admitting: Nurse Practitioner

## 2012-03-07 DIAGNOSIS — D0511 Intraductal carcinoma in situ of right breast: Secondary | ICD-10-CM

## 2012-03-07 DIAGNOSIS — C50911 Malignant neoplasm of unspecified site of right female breast: Secondary | ICD-10-CM

## 2012-03-10 ENCOUNTER — Encounter (HOSPITAL_COMMUNITY): Payer: Self-pay | Admitting: Pharmacy Technician

## 2012-03-11 ENCOUNTER — Other Ambulatory Visit (INDEPENDENT_AMBULATORY_CARE_PROVIDER_SITE_OTHER): Payer: Self-pay | Admitting: Surgery

## 2012-03-11 ENCOUNTER — Telehealth (INDEPENDENT_AMBULATORY_CARE_PROVIDER_SITE_OTHER): Payer: Self-pay | Admitting: General Surgery

## 2012-03-11 ENCOUNTER — Ambulatory Visit (HOSPITAL_COMMUNITY)
Admission: RE | Admit: 2012-03-11 | Discharge: 2012-03-11 | Disposition: A | Discharge status: Home / Self Care | Payer: BC Managed Care – PPO | Source: Ambulatory Visit | Attending: Anesthesiology | Admitting: Anesthesiology

## 2012-03-11 ENCOUNTER — Encounter (HOSPITAL_COMMUNITY)
Admission: RE | Admit: 2012-03-11 | Discharge: 2012-03-11 | Disposition: A | Discharge status: Home / Self Care | Payer: BC Managed Care – PPO | Source: Ambulatory Visit | Attending: Surgery | Admitting: Surgery

## 2012-03-11 ENCOUNTER — Telehealth (INDEPENDENT_AMBULATORY_CARE_PROVIDER_SITE_OTHER): Payer: Self-pay

## 2012-03-11 ENCOUNTER — Encounter (HOSPITAL_COMMUNITY): Payer: Self-pay

## 2012-03-11 DIAGNOSIS — I10 Essential (primary) hypertension: Secondary | ICD-10-CM | POA: Insufficient documentation

## 2012-03-11 DIAGNOSIS — E119 Type 2 diabetes mellitus without complications: Secondary | ICD-10-CM | POA: Insufficient documentation

## 2012-03-11 DIAGNOSIS — Z01812 Encounter for preprocedural laboratory examination: Secondary | ICD-10-CM | POA: Insufficient documentation

## 2012-03-11 DIAGNOSIS — Z01818 Encounter for other preprocedural examination: Secondary | ICD-10-CM | POA: Insufficient documentation

## 2012-03-11 HISTORY — DX: Shortness of breath: R06.02

## 2012-03-11 HISTORY — DX: Other specified disorders of nose and nasal sinuses: J34.89

## 2012-03-11 HISTORY — DX: Malignant (primary) neoplasm, unspecified: C80.1

## 2012-03-11 LAB — BASIC METABOLIC PANEL
BUN: 25 mg/dL — ABNORMAL HIGH (ref 6–23)
CO2: 27 mEq/L (ref 19–32)
Calcium: 9.6 mg/dL (ref 8.4–10.5)
Creatinine, Ser: 1.32 mg/dL — ABNORMAL HIGH (ref 0.50–1.10)
GFR calc non Af Amer: 42 mL/min — ABNORMAL LOW (ref >90)
Glucose, Bld: 94 mg/dL (ref 70–99)
Sodium: 142 mEq/L (ref 135–145)

## 2012-03-11 LAB — CBC
MCH: 16.3 pg — ABNORMAL LOW (ref 26.0–34.0)
MCHC: 27.4 g/dL — ABNORMAL LOW (ref 30.0–36.0)
MCV: 59.6 fL — ABNORMAL LOW (ref 78.0–100.0)
Platelets: 352 10*3/uL (ref 150–400)
RBC: 4.23 MIL/uL (ref 3.87–5.11)
RDW: 20.8 % — ABNORMAL HIGH (ref 11.5–15.5)

## 2012-03-11 LAB — SURGICAL PCR SCREEN: MRSA, PCR: POSITIVE — AB

## 2012-03-11 MED ORDER — VANCOMYCIN HCL 1000 MG IV SOLR: 1000.0000 mg | Freq: Once | INTRAVENOUS | Status: DC

## 2012-03-11 NOTE — Telephone Encounter (Signed)
Called pt today and needed to let her know that she will be needed to get a transfusion prior to surgery she is aware that she will be going to [facility] on Saturday June 15 at 9 am and on Monday June 17 at 8:15 am for 8:30 appt and she will go through admitting at [facility] on both days. I spoke to [place] [facility] and got all of this set up for the pt, Geraldine Contras was very helpful and Dewayne Hatch from [employer] is aware of the pt going to [facility] on Saturday and Monday and anesthis is aware of this. Dr Corliss Skains has placed all of the order in under Direct Admit and pt will have surgery on Wednesday June 19th. Pt is also aware if she has any question that she can call back and ask for Pattricia Boss and [place] Dec

## 2012-03-11 NOTE — Telephone Encounter (Signed)
Ann called to report a critical Hemoglobin of 6.9.  Her surgery is 6/19.  I paged Dr Corliss Skains.  The pt seems to have chronic anemia.  Dr Corliss Skains is ok operating on the pt but told me to ask  the preadmitting nurses to speak to Anesthesia about if they think she needs a transfusion before surgery.  She would need to be admitted the night before.  I called back and spoke to [place].  She will speak to [facility] with Anesthesia and get back to [place].

## 2012-03-11 NOTE — Pre-Procedure Instructions (Signed)
20 [name]  03/11/2012   Your procedure is scheduled on:  03/16/12  Report to [facility] at 9:15 AM.  Call this number if you have problems the morning of surgery: [phone]   Remember: Discontinue Aspirin, Coumadin, Plavix, Effient and herbal medications.   Do not eat food or drink liquids after midnight.     Take these medicines the morning of surgery with A SIP OF WATER: Zyrtec/Cetirizine, Prevacid, Prilosec/Omeprazole   Do not wear jewelry, make-up or nail polish.  Do not wear lotions, powders, or perfumes. You may wear deodorant.  Do not shave 48 hours prior to surgery. Men may shave face and neck.  Do not bring valuables to the hospital.  Contacts, dentures or bridgework may not be worn into surgery.  Leave suitcase in the car. After surgery it may be brought to your room.  For patients admitted to the hospital, checkout time is 11:00 AM the day of discharge.   Patients discharged the day of surgery will not be allowed to drive home.    Special Instructions: CHG Shower Use Special Wash: 1/2 bottle night before surgery and 1/2 bottle morning of surgery.   Please read over the following fact sheets that you were given: Pain Booklet, Coughing and Deep Breathing, MRSA Information and Surgical Site Infection Prevention

## 2012-03-11 NOTE — Progress Notes (Addendum)
Received call from lab about panic hgb 6.9. Called Dr. Corliss Skains office and gave result to Dossie Der at triage desk.   Will put chart out for Shonna Chock PA to review.  Sarah at Dr. Fatima Sanger office called back at 10:30 and states MD okay to continue with surgery but wanted me to check with anesthesia to see if she needs preop transfusion. Called Shonna Chock PA to check with her-she is going to follow up with me. Revonda Standard spoke with Dr. Gypsy Balsam and he would like T & S and ISTAT DOS to follow up on Hgb.   Per Pattricia Boss at Dr. Corliss Skains office patient is scheduled for T & C 6/15 and transfusion at [facility] on 6/18.

## 2012-03-11 NOTE — Progress Notes (Signed)
Quick Note:  This patient may proceed with surgery. I ordered vancomycin in addition to her ancef.  ______

## 2012-03-11 NOTE — Progress Notes (Signed)
Quick Note:  This patient may proceed with surgery. I put orders in the system for her to be admitted on Tuesday, 6/18 for transfusion prior to surgery on 6/19. Please call the patient as well as admitting to make sure this happens.   ______

## 2012-03-14 ENCOUNTER — Encounter (HOSPITAL_COMMUNITY): Payer: Self-pay

## 2012-03-14 ENCOUNTER — Encounter (HOSPITAL_COMMUNITY)
Admission: RE | Admit: 2012-03-14 | Discharge: 2012-03-14 | Disposition: A | Discharge status: Home / Self Care | Payer: BC Managed Care – PPO | Source: Ambulatory Visit | Attending: Surgery | Admitting: Surgery

## 2012-03-14 DIAGNOSIS — IMO0001 Reserved for inherently not codable concepts without codable children: Secondary | ICD-10-CM

## 2012-03-14 DIAGNOSIS — Z5189 Encounter for other specified aftercare: Secondary | ICD-10-CM

## 2012-03-14 DIAGNOSIS — D649 Anemia, unspecified: Secondary | ICD-10-CM | POA: Insufficient documentation

## 2012-03-14 HISTORY — DX: Reserved for inherently not codable concepts without codable children: IMO0001

## 2012-03-14 HISTORY — DX: Encounter for other specified aftercare: Z51.89

## 2012-03-14 LAB — CBC
MCV: 62.3 fL — ABNORMAL LOW (ref 78.0–100.0)
Platelets: 359 10*3/uL (ref 150–400)
RBC: 4.59 MIL/uL (ref 3.87–5.11)
RDW: 22.5 % — ABNORMAL HIGH (ref 11.5–15.5)
WBC: 7.4 10*3/uL (ref 4.0–10.5)

## 2012-03-14 MED ORDER — SODIUM CHLORIDE 0.9 % IV SOLN
Freq: Once | INTRAVENOUS | Status: AC
  Administered 2012-03-14: 09:00:00 via INTRAVENOUS

## 2012-03-14 NOTE — Progress Notes (Signed)
Quick Note:  This patient may proceed with surgery ______ 

## 2012-03-14 NOTE — Consult Note (Signed)
Anesthesia Chart Review:  Patient is a 61 year old female scheduled for right needle localized lumpectomy, right SN biopsy on 03/16/12 for breast CA.  History includes iron deficiency anemia requiring transfusion, morbid obesity with BMI 41, DM2, GERD, chest pain with low risk exercise stress test on 03/01/12, large hiatal hernia, chronic DOE, non-smoker.  She was evaluated by Gastroenterologist Dr. Carman Ching in July 2012 for profound anemia and had an EGD and colonoscopy.  She had an essentially normal colonoscopy and finding of a large hiatal hernia (7 cm in length) on EGD on 04/10/11  Dr. Randa Evens suspected that her iron deficiency was due to her large hiatal hernia and recommended iron replacement therapy.  She remains on Niferex.  She was evaluated by Cardiologist Dr. Riley Kill in August 2012 for chest discomfort.  He recommended an exercise stress test which she never had.  She was re-evaluated by Tereso Newcomer, PA-C on 02/26/12 for pre-operative evaluation.  He rescheduled the ETT for 03/01/12 and it ultimately was a low risk stress test.  She was cleared for this procedure from a Cardiac standpoint.  Echo on 04/08/12 showed normal LV systolic function, EF 55-60%, grade 1 diastolic dysfunction, mildly calcified mitral annulus, LA mildly dilated.     She had no active cardiopulmonary disease on 03/11/12.  03/11/12 Labs noted.  H/H 6.9/25.2.  This was already called to Dr. Fatima Sanger office.  He requested she get a pre-operative transfusion.  Ms. Gillean was suppose to go to [facility] on 03/12/12 and 03/14/12 for her transfusions.  We get an ISTAT (to reassess her Hgb) and T&S on the day of surgery.   Shonna Chock, PA-C

## 2012-03-14 NOTE — Discharge Instructions (Signed)
Blood Transfusion Information  WHAT IS A BLOOD TRANSFUSION?  A transfusion is the replacement of blood or some of its parts. Blood is made up of multiple cells which provide different functions.   Red blood cells carry oxygen and are used for blood loss replacement.   White blood cells fight against infection.   Platelets control bleeding.   Plasma helps clot blood.   Other blood products are available for specialized needs, such as hemophilia or other clotting disorders.  BEFORE THE TRANSFUSION   Who gives blood for transfusions?    You may be able to donate blood to be used at a later date on yourself (autologous donation).   Relatives can be asked to donate blood. This is generally not any safer than if you have received blood from a stranger. The same precautions are taken to ensure safety when a relative's blood is donated.   Healthy volunteers who are fully evaluated to make sure their blood is safe. This is blood bank blood.  Transfusion therapy is the safest it has ever been in the practice of medicine. Before blood is taken from a donor, a complete history is taken to make sure that person has no history of diseases nor engages in risky social behavior (examples are intravenous drug use or sexual activity with multiple partners). The donor's travel history is screened to minimize risk of transmitting infections, such as malaria. The donated blood is tested for signs of infectious diseases, such as HIV and hepatitis. The blood is then tested to be sure it is compatible with you in order to minimize the chance of a transfusion reaction. If you or a relative donates blood, this is often done in anticipation of surgery and is not appropriate for emergency situations. It takes many days to process the donated blood.  RISKS AND COMPLICATIONS  Although transfusion therapy is very safe and saves many lives, the main dangers of transfusion include:    Getting an infectious disease.   Developing a  transfusion reaction. This is an allergic reaction to something in the blood you were given. Every precaution is taken to prevent this.  The decision to have a blood transfusion has been considered carefully by your caregiver before blood is given. Blood is not given unless the benefits outweigh the risks.  AFTER THE TRANSFUSION   Right after receiving a blood transfusion, you will usually feel much better and more energetic. This is especially true if your red blood cells have gotten low (anemic). The transfusion raises the level of the red blood cells which carry oxygen, and this usually causes an energy increase.   The nurse administering the transfusion will monitor you carefully for complications.  HOME CARE INSTRUCTIONS   No special instructions are needed after a transfusion. You may find your energy is better. Speak with your caregiver about any limitations on activity for underlying diseases you may have.  SEEK MEDICAL CARE IF:    Your condition is not improving after your transfusion.   You develop redness or irritation at the intravenous (IV) site.  SEEK IMMEDIATE MEDICAL CARE IF:   Any of the following symptoms occur over the next 12 hours:   Shaking chills.   You have a temperature by mouth above 102 F (38.9 C), not controlled by medicine.   Chest, back, or muscle pain.   People around you feel you are not acting correctly or are confused.   Shortness of breath or difficulty breathing.   Dizziness and fainting.     You get a rash or develop hives.   You have a decrease in urine output.   Your urine turns a dark color or changes to pink, red, or brown.  Any of the following symptoms occur over the next 10 days:   You have a temperature by mouth above 102 F (38.9 C), not controlled by medicine.   Shortness of breath.   Weakness after normal activity.   The white part of the eye turns yellow (jaundice).   You have a decrease in the amount of urine or are urinating less often.   Your  urine turns a dark color or changes to pink, red, or brown.  Document Released: 09/11/2000 Document Revised: 09/03/2011 Document Reviewed: 04/30/2008  ExitCare Patient Information 2012 [employer].

## 2012-03-15 LAB — TYPE AND SCREEN
ABO/RH(D): A POS
Antibody Screen: NEGATIVE
Unit division: 0
Unit division: 0

## 2012-03-15 MED ORDER — CEFAZOLIN SODIUM-DEXTROSE 2-3 GM-% IV SOLR
2.0000 g | INTRAVENOUS | Status: AC
  Administered 2012-03-16: 2 g via INTRAVENOUS
  Filled 2012-03-15: qty 50

## 2012-03-16 ENCOUNTER — Encounter (HOSPITAL_COMMUNITY)
Admission: RE | Disposition: A | Discharge status: Home / Self Care | Payer: Self-pay | Source: Ambulatory Visit | Attending: Surgery

## 2012-03-16 ENCOUNTER — Ambulatory Visit
Admission: RE | Admit: 2012-03-16 | Discharge: 2012-03-16 | Disposition: A | Discharge status: Home / Self Care | Payer: BC Managed Care – PPO | Source: Ambulatory Visit | Attending: Surgery | Admitting: Surgery

## 2012-03-16 ENCOUNTER — Encounter (HOSPITAL_COMMUNITY): Payer: Self-pay | Admitting: Vascular Surgery

## 2012-03-16 ENCOUNTER — Ambulatory Visit (HOSPITAL_COMMUNITY): Payer: BC Managed Care – PPO | Admitting: Vascular Surgery

## 2012-03-16 ENCOUNTER — Encounter (HOSPITAL_COMMUNITY): Payer: Self-pay | Admitting: Surgery

## 2012-03-16 ENCOUNTER — Ambulatory Visit (HOSPITAL_COMMUNITY)
Admission: RE | Admit: 2012-03-16 | Discharge: 2012-03-16 | Disposition: A | Discharge status: Home / Self Care | Payer: BC Managed Care – PPO | Source: Ambulatory Visit | Attending: Surgery | Admitting: Surgery

## 2012-03-16 DIAGNOSIS — C50919 Malignant neoplasm of unspecified site of unspecified female breast: Secondary | ICD-10-CM

## 2012-03-16 DIAGNOSIS — D0590 Unspecified type of carcinoma in situ of unspecified breast: Secondary | ICD-10-CM

## 2012-03-16 DIAGNOSIS — D0510 Intraductal carcinoma in situ of unspecified breast: Secondary | ICD-10-CM

## 2012-03-16 DIAGNOSIS — D249 Benign neoplasm of unspecified breast: Secondary | ICD-10-CM | POA: Insufficient documentation

## 2012-03-16 DIAGNOSIS — I10 Essential (primary) hypertension: Secondary | ICD-10-CM | POA: Insufficient documentation

## 2012-03-16 DIAGNOSIS — K449 Diaphragmatic hernia without obstruction or gangrene: Secondary | ICD-10-CM | POA: Insufficient documentation

## 2012-03-16 DIAGNOSIS — D0511 Intraductal carcinoma in situ of right breast: Secondary | ICD-10-CM

## 2012-03-16 DIAGNOSIS — K219 Gastro-esophageal reflux disease without esophagitis: Secondary | ICD-10-CM | POA: Insufficient documentation

## 2012-03-16 DIAGNOSIS — C50911 Malignant neoplasm of unspecified site of right female breast: Secondary | ICD-10-CM

## 2012-03-16 DIAGNOSIS — R0602 Shortness of breath: Secondary | ICD-10-CM | POA: Insufficient documentation

## 2012-03-16 DIAGNOSIS — E119 Type 2 diabetes mellitus without complications: Secondary | ICD-10-CM | POA: Insufficient documentation

## 2012-03-16 HISTORY — DX: Malignant neoplasm of unspecified site of unspecified female breast: C50.919

## 2012-03-16 HISTORY — PX: OTHER SURGICAL HISTORY: SHX169

## 2012-03-16 LAB — TYPE AND SCREEN: ABO/RH(D): A POS

## 2012-03-16 LAB — POCT I-STAT 4, (NA,K, GLUC, HGB,HCT)
HCT: 33 % — ABNORMAL LOW (ref 36.0–46.0)
Sodium: 143 mEq/L (ref 135–145)

## 2012-03-16 LAB — ABO/RH: ABO/RH(D): A POS

## 2012-03-16 SURGERY — BREAST LUMPECTOMY WITH NEEDLE LOCALIZATION AND AXILLARY SENTINEL LYMPH NODE BX
Anesthesia: General | Site: Breast | Laterality: Right | Wound class: Clean

## 2012-03-16 MED ORDER — SODIUM CHLORIDE 0.9 % IV SOLN
10.0000 mg | INTRAVENOUS | Status: DC | PRN
  Administered 2012-03-16: 50 ug/min via INTRAVENOUS

## 2012-03-16 MED ORDER — GLYCOPYRROLATE 0.2 MG/ML IJ SOLN
INTRAMUSCULAR | Status: DC | PRN
  Administered 2012-03-16: .6 mg via INTRAVENOUS

## 2012-03-16 MED ORDER — ONDANSETRON HCL 4 MG/2ML IJ SOLN
INTRAMUSCULAR | Status: DC | PRN
  Administered 2012-03-16 (×2): 4 mg via INTRAVENOUS

## 2012-03-16 MED ORDER — NEOSTIGMINE METHYLSULFATE 1 MG/ML IJ SOLN
INTRAMUSCULAR | Status: DC | PRN
  Administered 2012-03-16: 3 mg via INTRAVENOUS

## 2012-03-16 MED ORDER — HYDROMORPHONE HCL PF 1 MG/ML IJ SOLN
0.2500 mg | INTRAMUSCULAR | Status: DC | PRN
  Administered 2012-03-16 (×2): 0.5 mg via INTRAVENOUS

## 2012-03-16 MED ORDER — LIDOCAINE HCL (CARDIAC) 20 MG/ML IV SOLN
INTRAVENOUS | Status: DC | PRN
  Administered 2012-03-16: 80 mg via INTRAVENOUS

## 2012-03-16 MED ORDER — TECHNETIUM TC 99M SULFUR COLLOID FILTERED
1.0000 | Freq: Once | INTRAVENOUS | Status: AC | PRN
  Administered 2012-03-16: 1 via INTRADERMAL

## 2012-03-16 MED ORDER — ONDANSETRON HCL 4 MG/2ML IJ SOLN: 4.0000 mg | Freq: Once | INTRAMUSCULAR | Status: DC | PRN

## 2012-03-16 MED ORDER — FENTANYL CITRATE 0.05 MG/ML IJ SOLN
INTRAMUSCULAR | Status: DC | PRN
  Administered 2012-03-16: 150 ug via INTRAVENOUS

## 2012-03-16 MED ORDER — OXYCODONE-ACETAMINOPHEN 5-325 MG PO TABS: 1.0000 | ORAL_TABLET | ORAL | Status: DC | PRN

## 2012-03-16 MED ORDER — PHENYLEPHRINE HCL 10 MG/ML IJ SOLN
INTRAMUSCULAR | Status: DC | PRN
  Administered 2012-03-16 (×3): 80 ug via INTRAVENOUS
  Administered 2012-03-16 (×2): 120 ug via INTRAVENOUS

## 2012-03-16 MED ORDER — BUPIVACAINE-EPINEPHRINE PF 0.25-1:200000 % IJ SOLN
INTRAMUSCULAR | Status: AC
  Filled 2012-03-16: qty 30

## 2012-03-16 MED ORDER — CEFAZOLIN SODIUM-DEXTROSE 2-3 GM-% IV SOLR: 2.0000 g | Freq: Once | INTRAVENOUS | Status: DC

## 2012-03-16 MED ORDER — POTASSIUM CHLORIDE IN NACL 20-0.9 MEQ/L-% IV SOLN
INTRAVENOUS | Status: DC
  Filled 2012-03-16 (×2): qty 1000

## 2012-03-16 MED ORDER — PROPOFOL 10 MG/ML IV BOLUS
INTRAVENOUS | Status: DC | PRN
  Administered 2012-03-16: 200 mg via INTRAVENOUS

## 2012-03-16 MED ORDER — SODIUM CHLORIDE 0.9 % IJ SOLN
INTRAMUSCULAR | Status: AC
  Filled 2012-03-16: qty 3

## 2012-03-16 MED ORDER — HYDROMORPHONE HCL PF 1 MG/ML IJ SOLN
INTRAMUSCULAR | Status: AC
  Filled 2012-03-16: qty 1

## 2012-03-16 MED ORDER — BUPIVACAINE-EPINEPHRINE 0.25% -1:200000 IJ SOLN
INTRAMUSCULAR | Status: DC | PRN
  Administered 2012-03-16: 20 mL

## 2012-03-16 MED ORDER — FENTANYL CITRATE 0.05 MG/ML IJ SOLN
50.0000 ug | INTRAMUSCULAR | Status: DC | PRN
  Administered 2012-03-16: 100 ug via INTRAVENOUS

## 2012-03-16 MED ORDER — 0.9 % SODIUM CHLORIDE (POUR BTL) OPTIME
TOPICAL | Status: DC | PRN
  Administered 2012-03-16: 1000 mL

## 2012-03-16 MED ORDER — MORPHINE SULFATE 2 MG/ML IJ SOLN: 2.0000 mg | INTRAMUSCULAR | Status: DC | PRN

## 2012-03-16 MED ORDER — LACTATED RINGERS IV SOLN
INTRAVENOUS | Status: DC
  Administered 2012-03-16: 11:00:00 via INTRAVENOUS

## 2012-03-16 MED ORDER — OXYCODONE-ACETAMINOPHEN 5-325 MG PO TABS: 1.0000 | ORAL_TABLET | ORAL | Status: AC | PRN

## 2012-03-16 MED ORDER — SODIUM CHLORIDE 0.9 % IJ SOLN
INTRAMUSCULAR | Status: DC | PRN
  Administered 2012-03-16: 13:00:00 via INTRAMUSCULAR

## 2012-03-16 MED ORDER — ROCURONIUM BROMIDE 100 MG/10ML IV SOLN
INTRAVENOUS | Status: DC | PRN
  Administered 2012-03-16: 35 mg via INTRAVENOUS

## 2012-03-16 MED ORDER — FENTANYL CITRATE 0.05 MG/ML IJ SOLN
INTRAMUSCULAR | Status: AC
  Filled 2012-03-16: qty 2

## 2012-03-16 MED ORDER — ONDANSETRON HCL 4 MG/2ML IJ SOLN: 4.0000 mg | Freq: Four times a day (QID) | INTRAMUSCULAR | Status: DC | PRN

## 2012-03-16 MED ORDER — LACTATED RINGERS IV SOLN
INTRAVENOUS | Status: DC | PRN
  Administered 2012-03-16 (×2): via INTRAVENOUS

## 2012-03-16 MED ORDER — ONDANSETRON HCL 4 MG/2ML IJ SOLN: 4.0000 mg | INTRAMUSCULAR | Status: DC | PRN

## 2012-03-16 MED ORDER — METHYLENE BLUE 1 % INJ SOLN
INTRAMUSCULAR | Status: AC
  Filled 2012-03-16: qty 10

## 2012-03-16 SURGICAL SUPPLY — 50 items
APL SKNCLS STERI-STRIP NONHPOA (GAUZE/BANDAGES/DRESSINGS) ×2
APPLIER CLIP 9.375 MED OPEN (MISCELLANEOUS)
APR CLP MED 9.3 20 MLT OPN (MISCELLANEOUS)
BENZOIN TINCTURE PRP APPL 2/3 (GAUZE/BANDAGES/DRESSINGS) ×3 IMPLANT
BINDER BREAST LRG (GAUZE/BANDAGES/DRESSINGS) IMPLANT
BINDER BREAST XLRG (GAUZE/BANDAGES/DRESSINGS) IMPLANT
BLADE SURG ROTATE 9660 (MISCELLANEOUS) IMPLANT
CHLORAPREP W/TINT 26ML (MISCELLANEOUS) ×2 IMPLANT
CLIP APPLIE 9.375 MED OPEN (MISCELLANEOUS) IMPLANT
CLIP TI WIDE RED SMALL 24 (CLIP) IMPLANT
CLOTH BEACON ORANGE TIMEOUT ST (SAFETY) ×2 IMPLANT
CONT SPEC 4OZ CLIKSEAL STRL BL (MISCELLANEOUS) ×2 IMPLANT
COVER PROBE W GEL 5X96 (DRAPES) ×2 IMPLANT
COVER SURGICAL LIGHT HANDLE (MISCELLANEOUS) ×2 IMPLANT
DECANTER SPIKE VIAL GLASS SM (MISCELLANEOUS) IMPLANT
DEVICE DUBIN SPECIMEN MAMMOGRA (MISCELLANEOUS) ×1 IMPLANT
DRAPE LAPAROTOMY TRNSV 102X78 (DRAPE) ×2 IMPLANT
DRAPE UTILITY 15X26 W/TAPE STR (DRAPE) ×4 IMPLANT
DRSG TEGADERM 4X4.75 (GAUZE/BANDAGES/DRESSINGS) ×2 IMPLANT
ELECT REM PT RETURN 9FT ADLT (ELECTROSURGICAL) ×2
ELECTRODE REM PT RTRN 9FT ADLT (ELECTROSURGICAL) ×1 IMPLANT
GAUZE SPONGE 4X4 16PLY XRAY LF (GAUZE/BANDAGES/DRESSINGS) ×2 IMPLANT
GLOVE BIO SURGEON STRL SZ7 (GLOVE) ×2 IMPLANT
GLOVE BIOGEL PI IND STRL 7.0 (GLOVE) ×2 IMPLANT
GLOVE BIOGEL PI IND STRL 7.5 (GLOVE) ×1 IMPLANT
GLOVE BIOGEL PI INDICATOR 7.0 (GLOVE) ×2
GLOVE BIOGEL PI INDICATOR 7.5 (GLOVE) ×1
GLOVE ECLIPSE 6.5 STRL STRAW (GLOVE) ×1 IMPLANT
GLOVE EXAM NITRILE MD LF STRL (GLOVE) ×2 IMPLANT
GOWN STRL NON-REIN LRG LVL3 (GOWN DISPOSABLE) ×6 IMPLANT
KIT BASIN OR (CUSTOM PROCEDURE TRAY) ×2 IMPLANT
KIT MARKER MARGIN INK (KITS) ×1 IMPLANT
KIT ROOM TURNOVER OR (KITS) ×2 IMPLANT
NDL HYPO 25GX1X1/2 BEV (NEEDLE) ×1 IMPLANT
NEEDLE 18GX1X1/2 (RX/OR ONLY) (NEEDLE) ×2 IMPLANT
NEEDLE HYPO 25GX1X1/2 BEV (NEEDLE) ×4 IMPLANT
NS IRRIG 1000ML POUR BTL (IV SOLUTION) ×2 IMPLANT
PACK GENERAL/GYN (CUSTOM PROCEDURE TRAY) ×2 IMPLANT
PAD ARMBOARD 7.5X6 YLW CONV (MISCELLANEOUS) ×3 IMPLANT
SPECIMEN JAR MEDIUM (MISCELLANEOUS) IMPLANT
SPONGE GAUZE 4X4 12PLY (GAUZE/BANDAGES/DRESSINGS) ×2 IMPLANT
STAPLER VISISTAT 35W (STAPLE) IMPLANT
STRIP CLOSURE SKIN 1/2X4 (GAUZE/BANDAGES/DRESSINGS) ×2 IMPLANT
SUT MNCRL AB 4-0 PS2 18 (SUTURE) ×2 IMPLANT
SUT VIC AB 3-0 SH 27 (SUTURE) ×2
SUT VIC AB 3-0 SH 27X BRD (SUTURE) ×1 IMPLANT
SYR CONTROL 10ML LL (SYRINGE) ×3 IMPLANT
TOWEL OR 17X24 6PK STRL BLUE (TOWEL DISPOSABLE) ×2 IMPLANT
TOWEL OR 17X26 10 PK STRL BLUE (TOWEL DISPOSABLE) ×2 IMPLANT
WATER STERILE IRR 1000ML POUR (IV SOLUTION) IMPLANT

## 2012-03-16 NOTE — Preoperative (Signed)
Beta Blockers   Reason not to administer Beta Blockers:Not Applicable 

## 2012-03-16 NOTE — Op Note (Signed)
Preop diagnosis: Right breast DCIS Postop diagnosis: Same Procedure performed: #1 blue dye injection #2 right axillary sentinel lymph node biopsy #3 right needle localized lumpectomy Surgeon:Kierra Jezewski K.  Anesthesia: GETT Indications:This is a 61 year old female who presents after a recent routine screening mammogram. She was found to have an abnormality in the right upper outer quadrant of her breast. Further workup showed a 1.4 cm lesion which revealed DCIS on core biopsy.The tumor is ER positive and PR positive. She presents now for needle localized lumpectomy. Due to the presence of the mass in her upper-outer quadrant the decision was made to obtain a sentinel lymph node biopsy at the same time in the event that this turns out to be invasive cancer.  Description of procedure: The patient was brought to the operating room and placed in a supine position on the operating room table. A wire had previously been placed by radiology in a lateral to medial direction. She had also been injected by the radiology technologist from nuclear medicine with technetium sulfur colloid. After an adequate level of general anesthesia was obtained her right breast axilla and upper arm were prepped with ChloraPrep and draped in sterile fashion. A timeout was taken to ensure the proper patient and proper procedure. We injected intradermally around her nipple with methylene blue solution. This area was massaged for several minutes. We interrogated the right axilla with the neoprobe device. An area of activity was detected and we selected a site for our sentinel lymph node biopsy incision. We infiltrated this area with .25% Marcaine with epinephrine. A transverse incision was made. Dissection was carried down in the subcutaneous tissues with cautery. We placed retractors to provide exposure. We did not visualize any blue dye but we were able to follow radioactivity down to a fairly active sentinel lymph node. We dissected  this free. Ex vivo activity was over 1900. There was no background activity over 20. This was sent for pathologic examination. We irrigated the axillary wound and packed with a sponge. We then injected local anesthetic around the wire. We raised an elliptical incision around the wire and dissected out a cylinder of tissue surrounding the wire. The lumpectomy specimen was oriented with a paint kit. A specimen mammogram showed that the wire and clip were within the specimen. This was also sent for pathologic examination. Both wounds were again irrigated and inspected for hemostasis. Both wounds were then closed with deep layers of 3-0 Vicryl and subcuticular layers of 4-0 Monocryl. Steri-Strips and clean dressings were applied. Patient was extubated and brought to the recovery room in stable condition. All sponge, initially, and needle counts are correct.  [name]. Andrea Skains, MD, [facility]  03/16/2012 1:53 PM

## 2012-03-16 NOTE — Discharge Instructions (Signed)
[facility] °Office Phone Number [phone] ° °BREAST BIOPSY/ PARTIAL MASTECTOMY: POST OP INSTRUCTIONS ° °Always review your discharge instruction sheet given to you by the facility where your surgery was performed. ° °IF YOU HAVE DISABILITY OR FAMILY LEAVE FORMS, YOU MUST BRING THEM TO THE OFFICE FOR PROCESSING.  DO NOT GIVE THEM TO YOUR DOCTOR. ° °1. A prescription for pain medication may be given to you upon discharge.  Take your pain medication as prescribed, if needed.  If narcotic pain medicine is not needed, then you may take acetaminophen (Tylenol) or ibuprofen (Advil) as needed. °2. Take your usually prescribed medications unless otherwise directed °3. If you need a refill on your pain medication, please contact your pharmacy.  They will contact our office to request authorization.  Prescriptions will not be filled after 5pm or on week-ends. °4. You should eat very light the first 24 hours after surgery, such as soup, crackers, pudding, etc.  Resume your normal diet the day after surgery. °5. Most patients will experience some swelling and bruising in the breast.  Ice packs and a good support bra will help.  Swelling and bruising can take several days to resolve.  °6. It is common to experience some constipation if taking pain medication after surgery.  Increasing fluid intake and taking a stool softener will usually help or prevent this problem from occurring.  A mild laxative (Milk of Magnesia or Miralax) should be taken according to package directions if there are no bowel movements after 48 hours. °7. Unless discharge instructions indicate otherwise, you may remove your bandages 48 hours after surgery, and you may shower at that time.  You will have steri-strips (small skin tapes) in place directly over the incision.  These strips should be left on the skin for 7-10 days.   Any sutures or staples will be removed at the office during your follow-up visit. °8. ACTIVITIES:  You may resume  regular daily activities (gradually increasing) beginning the next day.  Wearing a good support bra or sports bra minimizes pain and swelling.  You may have sexual intercourse when it is comfortable. °a. You may drive when you no longer are taking prescription pain medication, you can comfortably wear a seatbelt, and you can safely maneuver your car and apply brakes. °b. RETURN TO WORK:  1-2 weeks °9. You should see your doctor in the office for a follow-up appointment approximately two weeks after your surgery.  Your doctor’s nurse will typically make your follow-up appointment when she calls you with your pathology report.  Expect your pathology report 2-3 business days after your surgery.  You may call to check if you do not hear from us after three days. °10. OTHER INSTRUCTIONS: _______________________________________________________________________________________________ _____________________________________________________________________________________________________________________________________ °_____________________________________________________________________________________________________________________________________ °_____________________________________________________________________________________________________________________________________ ° °WHEN TO CALL YOUR DOCTOR: °1. Fever over 101.0 °2. Nausea and/or vomiting. °3. Extreme swelling or bruising. °4. Continued bleeding from incision. °5. Increased pain, redness, or drainage from the incision. ° °The clinic staff is available to answer your questions during regular business hours.  Please don’t hesitate to call and ask to speak to one of the nurses for clinical concerns.  If you have a medical emergency, go to the nearest emergency room or call 911.  A surgeon from [facility] is always on call at the hospital. ° °For further questions, please visit [email or web address]  ° ° °

## 2012-03-16 NOTE — H&P (View-Only) (Signed)
Patient ID: [name], female   DOB: 13-Sep-1951, 61 y.o.   MRN: [id number]  Chief Complaint  Patient presents with  . New Evaluation    New Br. Ca Pt. Rt Br DCIS    HPI [name] is a 61 y.o. female.  Referred by Dr Dorothyann Peng HPI 61 yo female presents after recent routine screening mammogram. She was noted to have an abnormality in the right upper outer quadrant of her breast. She underwent further workup with repeat mammogram and ultrasound. She underwent core biopsy which showed DCIS. The prognostic profile is pending. MRI showed a 1.4 cm lesion with no sign of multicentric or contralateral disease. Lymph nodes appear to be negative. She presents now for surgical evaluation. She is alone today.   She was seen by Dr. Eustaquio Maize at [facility] cardiology last year for some left shoulder pain. She has not had a stress test.Past Medical History  Diagnosis Date  . Chest pain   . Bradycardia   . Iron deficiency anemia   . Hiatal hernia     large  . Shoulder pain, left   . Hypertension   . Diabetes mellitus     type 2  . Morbid obesity   . GERD (gastroesophageal reflux disease)   . Blood transfusion     Past Surgical History  Procedure Date  . Breast lumpectomy 04/1997    Family History  Problem Relation Age of Onset  . Heart attack Father   . Heart disease Father     heart attack  . Cancer Sister     breast    Social History History  Substance Use Topics  . Smoking status: Never Smoker   . Smokeless tobacco: Never Used  . Alcohol Use: No    No Known Allergies  Current Outpatient Prescriptions  Medication Sig Dispense Refill  . cetirizine (ZYRTEC) 10 MG chewable tablet Chew 10 mg by mouth daily.        . iron polysaccharides (NIFEREX) 150 MG capsule Take 150 mg by mouth 2 (two) times daily.        . lansoprazole (PREVACID) 30 MG capsule Take 30 mg by mouth daily.        . Olmesartan-Amlodipine-HCTZ (TRIBENZOR) 40-10-25 MG TABS Take 1 tablet by mouth  daily.        . pioglitazone-metformin (ACTOPLUS MET) 15-850 MG per tablet Take 1 tablet by mouth 2 (two) times daily with a meal.        . aspirin 81 MG tablet Take 81 mg by mouth daily.          Review of Systems Review of Systems  Constitutional: Negative for fever, chills and unexpected weight change.  HENT: Positive for congestion. Negative for hearing loss, sore throat, trouble swallowing and voice change.   Eyes: Negative for visual disturbance.  Respiratory: Negative for cough and wheezing.   Cardiovascular: Positive for leg swelling. Negative for chest pain and palpitations.  Gastrointestinal: Negative for nausea, vomiting, abdominal pain, diarrhea, constipation, blood in stool, abdominal distention and anal bleeding.  Genitourinary: Negative for hematuria, vaginal bleeding and difficulty urinating.  Musculoskeletal: Negative for arthralgias.  Skin: Negative for rash and wound.  Neurological: Negative for seizures, syncope and headaches.  Hematological: Negative for adenopathy. Does not bruise/bleed easily.  Psychiatric/Behavioral: Negative for confusion.    Blood pressure 152/96, pulse 100, temperature 97.6 F (36.4 C), temperature source Temporal, resp. rate 14, height 5' 2.5" (1.588 m), weight 233 lb 9.6 oz (105.96 kg).  Physical Exam Physical Exam WDWN in NAD HEENT - /AT; EOMI, sclera anicteric Lungs - CTA B; normal respiratory effort CV - RRR Abd - soft, non-tender; no masses Breasts - symmetric; no nipple retraction or discharge; no axillary lymphadenopathy on either side.  RUOQ - 10:00 about 3 cm outside the nipple - 1 cm palpable mass; non-tender Data Reviewed Mammogram/ U/S/ MRI  Assessment    Right breast DCIS - upper outer quadrant Prognostic profile pending    Plan    Right lumpectomy - needle-localized; right axillary sentinel lymph node biopsy in case this shows some invasive cancer.  The surgical procedure has been discussed with the patient.   Potential risks, benefits, alternative treatments, and expected outcomes have been explained.  All of the patient's questions at this time have been answered.  The likelihood of reaching the patient's treatment goal is good.  The patient understand the proposed surgical procedure and wishes to proceed.        Jalesa Thien K. 02/24/2012, 11:54 AM

## 2012-03-16 NOTE — Anesthesia Preprocedure Evaluation (Addendum)
Anesthesia Evaluation  Patient identified by MRN, date of birth, ID band Patient awake    Reviewed: Allergy & Precautions, H&P , NPO status , Patient's Chart, lab work & pertinent test results  Airway Mallampati: II TM Distance: >3 FB Neck ROM: full    Dental  (+) Dental Advisory Given   Pulmonary shortness of breath and with exertion,          Cardiovascular hypertension, Pt. on medications Rhythm:regular Rate:Normal     Neuro/Psych    GI/Hepatic hiatal hernia, GERD-  Medicated and Controlled,  Endo/Other  Diabetes mellitus-, Type 2, Oral Hypoglycemic AgentsMorbid obesity  Renal/GU      Musculoskeletal   Abdominal (+) + obese,   Peds  Hematology   Anesthesia Other Findings Chronic Anemia  Reproductive/Obstetrics                         Anesthesia Physical Anesthesia Plan  ASA: III  Anesthesia Plan: General   Post-op Pain Management:    Induction: Intravenous  Airway Management Planned: Oral ETT and LMA  Additional Equipment:   Intra-op Plan:   Post-operative Plan: Extubation in OR  Informed Consent: I have reviewed the patients History and Physical, chart, labs and discussed the procedure including the risks, benefits and alternatives for the proposed anesthesia with the patient or authorized representative who has indicated his/her understanding and acceptance.     Plan Discussed with: CRNA, Anesthesiologist and Surgeon  Anesthesia Plan Comments:         Anesthesia Quick Evaluation

## 2012-03-16 NOTE — Anesthesia Procedure Notes (Signed)
Procedure Name: Intubation Date/Time: 03/16/2012 12:10 PM Performed by: Rogelia Boga Pre-anesthesia Checklist: Patient identified, Emergency Drugs available, Suction available, Patient being monitored and Timeout performed Patient Re-evaluated:Patient Re-evaluated prior to inductionOxygen Delivery Method: Circle system utilized Preoxygenation: Pre-oxygenation with 100% oxygen Intubation Type: IV induction Ventilation: Mask ventilation without difficulty and Oral airway inserted - appropriate to patient size Laryngoscope Size: Mac and 4 Grade View: Grade II Tube type: Oral Tube size: 7.5 mm Number of attempts: 1 Airway Equipment and Method: Stylet Placement Confirmation: ETT inserted through vocal cords under direct vision,  positive ETCO2 and breath sounds checked- equal and bilateral Secured at: 22 cm Tube secured with: Tape Dental Injury: Teeth and Oropharynx as per pre-operative assessment

## 2012-03-16 NOTE — Anesthesia Postprocedure Evaluation (Signed)
  Anesthesia Post-op Note  Patient: [name]  Procedure(s) Performed: Procedure(s) (LRB): BREAST LUMPECTOMY WITH NEEDLE LOCALIZATION AND AXILLARY SENTINEL LYMPH NODE BX (Right)  Patient Location: PACU  Anesthesia Type: General  Level of Consciousness: awake, alert , oriented and patient cooperative  Airway and Oxygen Therapy: Patient Spontanous Breathing and Patient connected to nasal cannula oxygen  Post-op Pain: mild  Post-op Assessment: Post-op Vital signs reviewed, Patient's Cardiovascular Status Stable, Respiratory Function Stable, Patent Airway, No signs of Nausea or vomiting and Pain level controlled  Post-op Vital Signs: stable  Complications: No apparent anesthesia complications

## 2012-03-16 NOTE — Interval H&P Note (Signed)
History and Physical Interval Note:  03/16/2012 11:18 AM  [name]  has presented today for surgery, with the diagnosis of right breast dcis   The various methods of treatment have been discussed with the patient and family. After consideration of risks, benefits and other options for treatment, the patient has consented to  Procedure(s) (LRB): BREAST LUMPECTOMY WITH NEEDLE LOCALIZATION AND AXILLARY SENTINEL LYMPH NODE BX (Right) as a surgical intervention .  The patient's history has been reviewed, patient examined, no change in status, stable for surgery.  I have reviewed the patients' chart and labs.  Questions were answered to the patient's satisfaction.     Keyshon Stein K.

## 2012-03-16 NOTE — Transfer of Care (Signed)
Immediate Anesthesia Transfer of Care Note  Patient: [name]  Procedure(s) Performed: Procedure(s) (LRB): BREAST LUMPECTOMY WITH NEEDLE LOCALIZATION AND AXILLARY SENTINEL LYMPH NODE BX (Right)  Patient Location: PACU  Anesthesia Type: General  Level of Consciousness: awake, alert  and oriented  Airway & Oxygen Therapy: Patient Spontanous Breathing and Patient connected to nasal cannula oxygen  Post-op Assessment: Report given to PACU RN, Post -op Vital signs reviewed and stable and Patient moving all extremities X 4  Post vital signs: Reviewed and stable  Complications: No apparent anesthesia complications

## 2012-03-21 ENCOUNTER — Other Ambulatory Visit (INDEPENDENT_AMBULATORY_CARE_PROVIDER_SITE_OTHER): Payer: Self-pay | Admitting: Surgery

## 2012-03-21 DIAGNOSIS — C50911 Malignant neoplasm of unspecified site of right female breast: Secondary | ICD-10-CM

## 2012-03-21 NOTE — Progress Notes (Signed)
Inferior and posterior margins close.  Will reexcise both of these margins.  Discussed with patient.  The surgical procedure has been discussed with the patient.  Potential risks, benefits, alternative treatments, and expected outcomes have been explained.  All of the patient's questions at this time have been answered.  The likelihood of reaching the patient's treatment goal is good.  The patient understand the proposed surgical procedure and wishes to proceed. Wilmon Arms. Corliss Skains, MD, [facility]  03/21/2012 3:48 PM

## 2012-03-24 ENCOUNTER — Telehealth: Payer: Self-pay | Admitting: *Deleted

## 2012-03-24 NOTE — Telephone Encounter (Signed)
Confirmed 03/29/12 appt w/ pt.  Mailed before appt letter & packet to pt.  Took paperwork to Med Rec for chart.

## 2012-03-25 ENCOUNTER — Encounter: Payer: Self-pay | Admitting: *Deleted

## 2012-03-25 NOTE — Progress Notes (Signed)
Emailed [name] to make her aware of Med Onc appt.

## 2012-03-28 ENCOUNTER — Other Ambulatory Visit: Payer: Self-pay | Admitting: Oncology

## 2012-03-29 ENCOUNTER — Other Ambulatory Visit (HOSPITAL_BASED_OUTPATIENT_CLINIC_OR_DEPARTMENT_OTHER): Payer: BC Managed Care – PPO | Admitting: Lab

## 2012-03-29 ENCOUNTER — Other Ambulatory Visit: Payer: Self-pay | Admitting: Oncology

## 2012-03-29 ENCOUNTER — Ambulatory Visit (HOSPITAL_BASED_OUTPATIENT_CLINIC_OR_DEPARTMENT_OTHER): Payer: BC Managed Care – PPO | Admitting: Oncology

## 2012-03-29 ENCOUNTER — Ambulatory Visit: Payer: BC Managed Care – PPO

## 2012-03-29 VITALS — BP 131/87 | HR 102 | Temp 98.7°F | Ht 62.5 in | Wt 237.7 lb

## 2012-03-29 DIAGNOSIS — D0590 Unspecified type of carcinoma in situ of unspecified breast: Secondary | ICD-10-CM

## 2012-03-29 DIAGNOSIS — D0510 Intraductal carcinoma in situ of unspecified breast: Secondary | ICD-10-CM

## 2012-03-29 LAB — CBC WITH DIFFERENTIAL/PLATELET
Basophils Absolute: 0 10*3/uL (ref 0.0–0.1)
EOS%: 3.4 % (ref 0.0–7.0)
HCT: 27.4 % — ABNORMAL LOW (ref 34.8–46.6)
HGB: 8.2 g/dL — ABNORMAL LOW (ref 11.6–15.9)
LYMPH%: 26.3 % (ref 14.0–49.7)
MCH: 18.3 pg — ABNORMAL LOW (ref 25.1–34.0)
MCV: 61.2 fL — ABNORMAL LOW (ref 79.5–101.0)
MONO%: 8 % (ref 0.0–14.0)
NEUT%: 61.7 % (ref 38.4–76.8)
Platelets: 322 10*3/uL (ref 145–400)
lymph#: 1.8 10*3/uL (ref 0.9–3.3)

## 2012-03-29 LAB — COMPREHENSIVE METABOLIC PANEL
Albumin: 3.2 g/dL — ABNORMAL LOW (ref 3.5–5.2)
Alkaline Phosphatase: 69 U/L (ref 39–117)
BUN: 21 mg/dL (ref 6–23)
Creatinine, Ser: 1.2 mg/dL — ABNORMAL HIGH (ref 0.50–1.10)
Glucose, Bld: 100 mg/dL — ABNORMAL HIGH (ref 70–99)
Potassium: 3.8 mEq/L (ref 3.5–5.3)

## 2012-03-29 NOTE — Progress Notes (Signed)
ID: Paulina Fusi   DOB: [birth year]  MR#: [id number]  WUJ#:[id number]  HISTORY OF PRESENT ILLNESS: Ms. Sawchuk had routine screening mammography at the breast center 01/21/2012. This showed a possible mass in the right breast. Additional views were obtained may 06/17/2012 and these confirmed an irregular mass in the upper outer right breast, which was palpable. Ultrasound showed a solid mass measuring 1.2 cm with a dilated duct extending medially from the mass. The right axilla was unremarkable by ultrasound.  Biopsy of the right breast mass on 02/11/2012 showed ([id number]) ductal carcinoma in situ, involving a papilloma. Estrogen receptor was 100% positive. Progesterone receptor was 92% positive. Breast MRI was obtained 02/19/2012. This confirmed an oval nonenhancing mass with lobulated margins in the upper outer quadrant of the right breast measuring 1.4 cm, with no additional suspicious masses and no axillary or internal mammary adenopathy.  Accordingly on 03/16/2012 the patient underwent right lumpectomy with sentinel lymph node sampling. This showed ([id number]) ductal carcinoma in situ, high-grade, measuring 1.8 cm, with negative but close margins, the closest being 1 mm. The sentinel lymph node was negative. The patient tolerated the surgery well. Her subsequent history is as detailed below.  INTERVAL HISTORY: Since the lumpectomy, the patient has met with her surgeon, Dr.Tsuei, and further resection is planned for margin clearance.  REVIEW OF SYSTEMS: Ms. Grubb was having no symptoms specifically leading to her mammogram, which was routine. In particular, although her mass was palpable once found, she had not palpated. She does complain of mild fatigue. She has pain in the left hip, which is intermittent. She has sinus problems frequently. Occasionally her ankles swell, and she can be short of breath when walking up stairs. She is issues relating to diabetes and anemia, but otherwise a detailed  review of systems today was noncontributory.  PAST MEDICAL HISTORY: Past Medical History  Diagnosis Date  . Chest pain     Echocardiogram 03/2011: EF 55-60%, grade 1 diastolic dysfunction, mild MAC, mild LAE  . Bradycardia   . Iron deficiency anemia   . Hiatal hernia     large  . Hypertension   . Diabetes mellitus     type 2  . Morbid obesity   . GERD (gastroesophageal reflux disease)   . Blood transfusion   . Shortness of breath     with exertion  . Cancer     right breast ductal carcinoma in situ  . Sinus drainage     PAST SURGICAL HISTORY: Past Surgical History  Procedure Date  . Breast lumpectomy 04/1997   she had a left lumpectomy in 1998, benign.  FAMILY HISTORY Family History  Problem Relation Age of Onset  . Coronary artery disease Father     heart attack  . Cancer Sister     breast   the patient's father died from heart disease at the age of 41. The patient's mother is alive, as of July of 20141, being 63 years old. The patient has 2 brothers and 4 sisters. One sister was diagnosed with lymphoma at age 28, and died from that disease at age 101. Her daughter, the patient's niece, had pancreatic cancer diagnosed at age 69. Another niece was diagnosed with breast cancer at age 52, and a second sister of the patient was diagnosed with breast cancer at age 17, and survives.  GYNECOLOGIC HISTORY: Menarche age 61, last menstrual period age 71. The patient did not take hormone replacement. She is GX P1, first live birth age 61.  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 Her husband [name] is a retired used Community education officer and currently drives a school bus. Son [name] lives in [place] and is an Producer, television/film/video. The patient has 2 biological grandchildren and 6 step grandchildren. She attends [employer] locally   ADVANCED DIRECTIVES: not in place  HEALTH MAINTENANCE: History  Substance Use  Topics  . Smoking status: Never Smoker   . Smokeless tobacco: Never Used  . Alcohol Use: No     Colonoscopy: July 2012/[facility]  PAP:2012/ [facility]  Bone density:July 2012  Lipid panel:  No Known Allergies  Current Outpatient Prescriptions  Medication Sig Dispense Refill  . cetirizine (ZYRTEC) 10 MG chewable tablet Chew 10 mg by mouth daily as needed. For allergies      . iron polysaccharides (NIFEREX) 150 MG capsule Take 150 mg by mouth 2 (two) times daily.        . lansoprazole (PREVACID) 30 MG capsule Take 30 mg by mouth as needed.       . Olmesartan-Amlodipine-HCTZ (TRIBENZOR) 40-10-25 MG TABS Take 1 tablet by mouth daily.        [name] omeprazole (PRILOSEC) 20 MG capsule Take 20 mg by mouth daily as needed. For reflux      . pioglitazone-metformin (ACTOPLUS MET) 15-850 MG per tablet Take 1 tablet by mouth 2 (two) times daily with a meal.         Current Facility-Administered Medications  Medication Dose Route Frequency Provider Last Rate Last Dose  . vancomycin (VANCOCIN) 1,000 mg in sodium chloride 0.9 % 500 mL IVPB  1,000 mg Intravenous Once Wilmon Arms. Tsuei, MD        OBJECTIVE: Middle-aged African American woman in no acute distress Filed Vitals:   03/29/12 1633  BP: 131/87  Pulse: 102  Temp: 98.7 F (37.1 C)     Body mass index is 42.78 kg/(m^2).    ECOG FS:0  Sclerae unicteric Oropharynx clear No cervical or supraclavicular adenopathy Lungs no rales or rhonchi Heart regular rate and rhythm Abd benign MSK no focal spinal tenderness, no peripheral edema Neuro: nonfocal Breasts: The right breast is status post recent lumpectomy. The incision is healing well, with no dehiscence, swelling, erythema, or unusual pain. There are no skin or nipple changes of concern. The axilla is unremarkable. The left breast is status post remote lumpectomy. It is otherwise unremarkable.  LAB RESULTS: Lab Results  Component Value Date   WBC 6.8 03/29/2012   NEUTROABS 4.2 03/29/2012   HGB  8.2* 03/29/2012   HCT 27.4* 03/29/2012   MCV 61.2* 03/29/2012   PLT 322 03/29/2012      Chemistry      Component Value Date/Time   NA 143 03/16/2012 1054   K 3.7 03/16/2012 1054   CL 105 03/11/2012 0931   CO2 27 03/11/2012 0931   BUN 25* 03/11/2012 0931   CREATININE 1.32* 03/11/2012 0931      Component Value Date/Time   CALCIUM 9.6 03/11/2012 0931   ALKPHOS 61 04/11/2011 0550   AST 18 04/11/2011 0550   ALT 13 04/11/2011 0550   BILITOT 0.3 04/11/2011 0550       No results found for this basename: LABCA2    No components found with this basename: LABCA125    No results found for this basename: INR:1;PROTIME:1 in the last 168 hours  Urinalysis No results found for this basename: colorurine, appearanceur, labspec, phurine, glucoseu, hgbur, bilirubinur,  ketonesur, proteinur, urobilinogen, nitrite, leukocytesur    STUDIES: Dg Chest 2 View  03/11/2012  *RADIOLOGY REPORT*  Clinical Data: Abnormal prior chest radiograph.  Lumpectomy with right sentinel node biopsy.  Preoperative examination.  CHEST - 2 VIEW  Comparison: 04/08/2011.  Findings: Elevation of the right hemidiaphragm persists. Eventration of the left hemidiaphragm.  Cardiopericardial silhouette is within normal limits on today's exam.  There is no airspace disease.  No effusion.  High-riding right humeral head compatible with chronic rotator cuff tear.  No plain film evidence of adenopathy.  IMPRESSION: No active cardiopulmonary disease.  Original Report Authenticated By: Andreas Newport, M.D.   Nm Sentinel Node Inj-no Rpt (breast)  03/16/2012  CLINICAL DATA: Right upper outer quadrant DCIS   Sulfur colloid was injected intradermally by the nuclear medicine  technologist for breast cancer sentinel node localization.     Mm Breast Surgical Specimen  03/16/2012  *RADIOLOGY REPORT*  Clinical Data:  Right-sided breast cancer  NEEDLE LOCALIZATION WITH MAMMOGRAPHIC GUIDANCE AND SPECIMEN RADIOGRAPH  Comparison:  Previous exams  Patient presents  for needle localization prior to right lumpectomy. I met with the patient and we discussed the procedure of needle localization including benefits and alternatives. We discussed the high likelihood of a successful procedure. We discussed the risks of the procedure, including infection, bleeding, tissue injury, and further surgery. Informed, written consent was given.  Using mammographic guidance, sterile technique, 2% lidocaine and a 5 cm modified Kopans needle, the mass and clip are localized using a a lateral approach.  The films are marked for Dr. Corliss Skains.  Specimen radiograph was performed at [facility], and confirms the mass, wire and clip are present in the tissue sample.  The specimen is marked for pathology.  IMPRESSION: Needle localization right breast.  No apparent complications.  Original Report Authenticated By: Hiram Gash, M.D.   Mm Breast Wire Localization Right  03/16/2012  *RADIOLOGY REPORT*  Clinical Data:  Right-sided breast cancer  NEEDLE LOCALIZATION WITH MAMMOGRAPHIC GUIDANCE AND SPECIMEN RADIOGRAPH  Comparison:  Previous exams  Patient presents for needle localization prior to right lumpectomy. I met with the patient and we discussed the procedure of needle localization including benefits and alternatives. We discussed the high likelihood of a successful procedure. We discussed the risks of the procedure, including infection, bleeding, tissue injury, and further surgery. Informed, written consent was given.  Using mammographic guidance, sterile technique, 2% lidocaine and a 5 cm modified Kopans needle, the mass and clip are localized using a a lateral approach.  The films are marked for Dr. Corliss Skains.  Specimen radiograph was performed at [facility], and confirms the mass, wire and clip are present in the tissue sample.  The specimen is marked for pathology.  IMPRESSION: Needle localization right breast.  No apparent complications.  Original Report Authenticated By: Hiram Gash,  M.D.    ASSESSMENT: 61 y.o. [place] woman s/p Right lumpectomy and sentinel lymph node sampling 03/16/2012 for a 1.8 cm ductal carcinoma in situ, high-grade, strongly estrogen and progesterone receptor positive.  PLAN: We spent the better part of her of 90 minute visit discussing the biology of her tumor, and she understands that noninvasive breast cancer in itself is not a life-threatening. Accordingly we are very comfortable with her keeping her breast, which is indeed our recommendation. This does mean that she accepts some risk of this cancer recurring in the same breast. She can decrease that risk in 3 ways: By obtaining wider margins; by receiving post lumpectomy radiation; and  by taking antiestrogen meds.  She has a ready met with Dr. Corliss Skains and is planning to undergo of further surgery to obtain better margins. She will then meet with Dr. Basilio Cairo to discuss of radiation. The patient is a good candidate for the B-43 study and we discussed that today in a preliminary way. She will see me again in early October, by which time she should be done with radiation, and we should be able to make a definitive decision regarding tamoxifen. She knows to call for any problems that may develop before the next visit   MAGRINAT,GUSTAV C    03/29/2012

## 2012-03-30 ENCOUNTER — Telehealth: Payer: Self-pay | Admitting: *Deleted

## 2012-03-30 ENCOUNTER — Encounter: Payer: Self-pay | Admitting: *Deleted

## 2012-03-30 NOTE — Progress Notes (Signed)
Mailed after appt letter to pt. 

## 2012-03-30 NOTE — Telephone Encounter (Signed)
Gave patient appointment for 04-05-2012 at 9:30pm

## 2012-04-01 ENCOUNTER — Encounter: Payer: Self-pay | Admitting: Oncology

## 2012-04-01 NOTE — Progress Notes (Signed)
Patient came in as a new Patient on 03/30/12,and she has one insurance,I did give the patient a medicaid application she said sure she would take one and fill it out,and she also said she would return it to Department of social service here in [place] on [address].I told her once she find out who her case worker will be to call and let me know so that I can follow up with her application.

## 2012-04-04 ENCOUNTER — Telehealth: Payer: Self-pay | Admitting: Oncology

## 2012-04-04 NOTE — Telephone Encounter (Signed)
S/w the pt and he is aware of her oct appt

## 2012-04-05 ENCOUNTER — Ambulatory Visit: Payer: BC Managed Care – PPO | Admitting: Oncology

## 2012-04-06 ENCOUNTER — Encounter: Payer: Self-pay | Admitting: *Deleted

## 2012-04-06 ENCOUNTER — Telehealth (INDEPENDENT_AMBULATORY_CARE_PROVIDER_SITE_OTHER): Payer: Self-pay | Admitting: General Surgery

## 2012-04-06 NOTE — Progress Notes (Signed)
Pt had to have blood preop last breast surgery 6/13-was [facility] -then surgery  done at short stay hgb then was 6.9 preop Dr Luanna Cole did cbc 03/29/12-hgb 8.2-too low to be done here per dr Jacolyn Reedy dr tsuei's office to tell them, and what would they like to do?

## 2012-04-06 NOTE — Telephone Encounter (Signed)
error 

## 2012-04-07 ENCOUNTER — Encounter: Payer: Self-pay | Admitting: *Deleted

## 2012-04-07 ENCOUNTER — Encounter: Payer: Self-pay | Admitting: Radiation Oncology

## 2012-04-07 NOTE — Progress Notes (Signed)
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  Her husband [name] is a retired used Community education officer and currently drives a school bus. Son [name] lives in [place] and is an Producer, television/film/video. The patient has 2 biological grandchildren and 6 step grandchildren. She attends [employer] locally

## 2012-04-08 ENCOUNTER — Encounter (HOSPITAL_COMMUNITY)
Admission: RE | Admit: 2012-04-08 | Discharge: 2012-04-08 | Disposition: A | Discharge status: Home / Self Care | Payer: BC Managed Care – PPO | Source: Ambulatory Visit | Attending: Surgery | Admitting: Surgery

## 2012-04-08 ENCOUNTER — Encounter (HOSPITAL_COMMUNITY): Payer: Self-pay

## 2012-04-08 ENCOUNTER — Ambulatory Visit: Payer: BC Managed Care – PPO

## 2012-04-08 ENCOUNTER — Encounter (HOSPITAL_COMMUNITY): Payer: Self-pay | Admitting: Pharmacy Technician

## 2012-04-08 ENCOUNTER — Ambulatory Visit
Admission: RE | Admit: 2012-04-08 | Payer: BC Managed Care – PPO | Source: Ambulatory Visit | Admitting: Radiation Oncology

## 2012-04-08 DIAGNOSIS — K219 Gastro-esophageal reflux disease without esophagitis: Secondary | ICD-10-CM | POA: Insufficient documentation

## 2012-04-08 DIAGNOSIS — Z170 Estrogen receptor positive status [ER+]: Secondary | ICD-10-CM | POA: Insufficient documentation

## 2012-04-08 DIAGNOSIS — J3489 Other specified disorders of nose and nasal sinuses: Secondary | ICD-10-CM | POA: Insufficient documentation

## 2012-04-08 DIAGNOSIS — I10 Essential (primary) hypertension: Secondary | ICD-10-CM | POA: Insufficient documentation

## 2012-04-08 DIAGNOSIS — D0590 Unspecified type of carcinoma in situ of unspecified breast: Secondary | ICD-10-CM | POA: Insufficient documentation

## 2012-04-08 DIAGNOSIS — E119 Type 2 diabetes mellitus without complications: Secondary | ICD-10-CM | POA: Insufficient documentation

## 2012-04-08 DIAGNOSIS — Z79899 Other long term (current) drug therapy: Secondary | ICD-10-CM | POA: Insufficient documentation

## 2012-04-08 DIAGNOSIS — Z510 Encounter for antineoplastic radiation therapy: Secondary | ICD-10-CM | POA: Insufficient documentation

## 2012-04-08 LAB — CBC
MCV: 62.1 fL — ABNORMAL LOW (ref 78.0–100.0)
Platelets: 323 10*3/uL (ref 150–400)
RBC: 4.27 MIL/uL (ref 3.87–5.11)
WBC: 6.4 10*3/uL (ref 4.0–10.5)

## 2012-04-08 LAB — BASIC METABOLIC PANEL
CO2: 27 mEq/L (ref 19–32)
Calcium: 9.3 mg/dL (ref 8.4–10.5)
Sodium: 139 mEq/L (ref 135–145)

## 2012-04-08 LAB — SURGICAL PCR SCREEN: Staphylococcus aureus: NEGATIVE

## 2012-04-08 NOTE — Pre-Procedure Instructions (Signed)
20 [name]  04/08/2012   Your procedure is scheduled on:  July 15  Report to [facility] at 11:30 AM.  Call this number if you have problems the morning of surgery: [phone]   Remember:   Do not eat food:After Midnight.  May have clear liquids:until Midnight .  Clear liquids include soda, tea, black coffee, apple or grape juice, broth.  Take these medicines the morning of surgery with A SIP OF WATER: Prevacid,    Do not wear jewelry, make-up or nail polish.  Do not wear lotions, powders, or perfumes. You may wear deodorant.  Do not shave 48 hours prior to surgery. Men may shave face and neck.  Do not bring valuables to the hospital.  Contacts, dentures or bridgework may not be worn into surgery.  Leave suitcase in the car. After surgery it may be brought to your room.  For patients admitted to the hospital, checkout time is 11:00 AM the day of discharge.   Patients discharged the day of surgery will not be allowed to drive home.  Name and phone number of your driver: Alica Shellhammer [phone]  Special Instructions: CHG Shower Use Special Wash: 1/2 bottle night before surgery and 1/2 bottle morning of surgery.   Please read over the following fact sheets that you were given: Pain Booklet, Coughing and Deep Breathing, MRSA Information and Surgical Site Infection Prevention

## 2012-04-08 NOTE — Progress Notes (Signed)
Notified Dr. Noreene Larsson of CBC.

## 2012-04-10 MED ORDER — CEFAZOLIN SODIUM-DEXTROSE 2-3 GM-% IV SOLR
2.0000 g | INTRAVENOUS | Status: AC
  Administered 2012-04-11: 2 g via INTRAVENOUS
  Filled 2012-04-10: qty 50

## 2012-04-11 ENCOUNTER — Ambulatory Visit (HOSPITAL_COMMUNITY): Payer: BC Managed Care – PPO | Admitting: Anesthesiology

## 2012-04-11 ENCOUNTER — Ambulatory Visit (HOSPITAL_BASED_OUTPATIENT_CLINIC_OR_DEPARTMENT_OTHER): Admission: RE | Admit: 2012-04-11 | Payer: BC Managed Care – PPO | Source: Ambulatory Visit | Admitting: Surgery

## 2012-04-11 ENCOUNTER — Encounter (HOSPITAL_COMMUNITY)
Admission: RE | Disposition: A | Discharge status: Home / Self Care | Payer: Self-pay | Source: Ambulatory Visit | Attending: Surgery

## 2012-04-11 ENCOUNTER — Encounter (HOSPITAL_COMMUNITY): Payer: Self-pay | Admitting: Anesthesiology

## 2012-04-11 ENCOUNTER — Ambulatory Visit (HOSPITAL_COMMUNITY)
Admission: RE | Admit: 2012-04-11 | Discharge: 2012-04-11 | Disposition: A | Discharge status: Home / Self Care | Payer: BC Managed Care – PPO | Source: Ambulatory Visit | Attending: Surgery | Admitting: Surgery

## 2012-04-11 ENCOUNTER — Encounter (HOSPITAL_BASED_OUTPATIENT_CLINIC_OR_DEPARTMENT_OTHER): Admission: RE | Payer: Self-pay | Source: Ambulatory Visit

## 2012-04-11 ENCOUNTER — Encounter (HOSPITAL_COMMUNITY): Payer: Self-pay | Admitting: *Deleted

## 2012-04-11 DIAGNOSIS — D0510 Intraductal carcinoma in situ of unspecified breast: Secondary | ICD-10-CM

## 2012-04-11 DIAGNOSIS — Z01812 Encounter for preprocedural laboratory examination: Secondary | ICD-10-CM | POA: Insufficient documentation

## 2012-04-11 DIAGNOSIS — K219 Gastro-esophageal reflux disease without esophagitis: Secondary | ICD-10-CM | POA: Insufficient documentation

## 2012-04-11 DIAGNOSIS — R0602 Shortness of breath: Secondary | ICD-10-CM | POA: Insufficient documentation

## 2012-04-11 DIAGNOSIS — D0590 Unspecified type of carcinoma in situ of unspecified breast: Secondary | ICD-10-CM | POA: Insufficient documentation

## 2012-04-11 DIAGNOSIS — K449 Diaphragmatic hernia without obstruction or gangrene: Secondary | ICD-10-CM | POA: Insufficient documentation

## 2012-04-11 DIAGNOSIS — E119 Type 2 diabetes mellitus without complications: Secondary | ICD-10-CM | POA: Insufficient documentation

## 2012-04-11 DIAGNOSIS — C50919 Malignant neoplasm of unspecified site of unspecified female breast: Secondary | ICD-10-CM

## 2012-04-11 DIAGNOSIS — I10 Essential (primary) hypertension: Secondary | ICD-10-CM | POA: Insufficient documentation

## 2012-04-11 SURGERY — EXCISION, LESION, BREAST
Anesthesia: General | Laterality: Right

## 2012-04-11 MED ORDER — ONDANSETRON HCL 4 MG/2ML IJ SOLN
INTRAMUSCULAR | Status: DC | PRN
  Administered 2012-04-11: 4 mg via INTRAVENOUS

## 2012-04-11 MED ORDER — BUPIVACAINE-EPINEPHRINE PF 0.25-1:200000 % IJ SOLN
INTRAMUSCULAR | Status: AC
  Filled 2012-04-11: qty 30

## 2012-04-11 MED ORDER — OXYCODONE-ACETAMINOPHEN 5-325 MG PO TABS: 1.0000 | ORAL_TABLET | ORAL | Status: DC | PRN

## 2012-04-11 MED ORDER — HYDROMORPHONE HCL PF 1 MG/ML IJ SOLN: 0.2500 mg | INTRAMUSCULAR | Status: DC | PRN

## 2012-04-11 MED ORDER — LIDOCAINE HCL (CARDIAC) 20 MG/ML IV SOLN
INTRAVENOUS | Status: DC | PRN
  Administered 2012-04-11: 60 mg via INTRAVENOUS

## 2012-04-11 MED ORDER — METOCLOPRAMIDE HCL 5 MG/ML IJ SOLN
INTRAMUSCULAR | Status: DC | PRN
  Administered 2012-04-11: 10 mg via INTRAVENOUS

## 2012-04-11 MED ORDER — PROPOFOL 10 MG/ML IV EMUL
INTRAVENOUS | Status: DC | PRN
  Administered 2012-04-11: 50 mg via INTRAVENOUS
  Administered 2012-04-11: 150 mg via INTRAVENOUS

## 2012-04-11 MED ORDER — LACTATED RINGERS IV SOLN
INTRAVENOUS | Status: DC | PRN
  Administered 2012-04-11: 14:00:00 via INTRAVENOUS

## 2012-04-11 MED ORDER — CHLORHEXIDINE GLUCONATE 4 % EX LIQD: 1.0000 "application " | Freq: Once | CUTANEOUS | Status: DC

## 2012-04-11 MED ORDER — FENTANYL CITRATE 0.05 MG/ML IJ SOLN
INTRAMUSCULAR | Status: DC | PRN
  Administered 2012-04-11: 100 ug via INTRAVENOUS
  Administered 2012-04-11 (×3): 50 ug via INTRAVENOUS

## 2012-04-11 MED ORDER — DEXAMETHASONE SODIUM PHOSPHATE 4 MG/ML IJ SOLN
INTRAMUSCULAR | Status: DC | PRN
  Administered 2012-04-11: 4 mg via INTRAVENOUS

## 2012-04-11 MED ORDER — MIDAZOLAM HCL 5 MG/5ML IJ SOLN: INTRAMUSCULAR | Status: DC | PRN

## 2012-04-11 MED ORDER — DROPERIDOL 2.5 MG/ML IJ SOLN: 0.6250 mg | INTRAMUSCULAR | Status: DC | PRN

## 2012-04-11 MED ORDER — LACTATED RINGERS IV SOLN
INTRAVENOUS | Status: DC
  Administered 2012-04-11: 13:00:00 via INTRAVENOUS

## 2012-04-11 MED ORDER — MORPHINE SULFATE 2 MG/ML IJ SOLN: 2.0000 mg | INTRAMUSCULAR | Status: DC | PRN

## 2012-04-11 MED ORDER — SODIUM CHLORIDE 0.9 % IR SOLN
Status: DC | PRN
  Administered 2012-04-11: 1

## 2012-04-11 MED ORDER — BUPIVACAINE-EPINEPHRINE 0.25% -1:200000 IJ SOLN
INTRAMUSCULAR | Status: DC | PRN
  Administered 2012-04-11: 10 mL

## 2012-04-11 MED ORDER — ONDANSETRON HCL 4 MG/2ML IJ SOLN: 4.0000 mg | INTRAMUSCULAR | Status: DC | PRN

## 2012-04-11 SURGICAL SUPPLY — 51 items
APL SKNCLS STERI-STRIP NONHPOA (GAUZE/BANDAGES/DRESSINGS)
BENZOIN TINCTURE PRP APPL 2/3 (GAUZE/BANDAGES/DRESSINGS) ×1 IMPLANT
BINDER BREAST LRG (GAUZE/BANDAGES/DRESSINGS) IMPLANT
BINDER BREAST XLRG (GAUZE/BANDAGES/DRESSINGS) IMPLANT
BLADE SURG 15 STRL LF DISP TIS (BLADE) ×1 IMPLANT
BLADE SURG 15 STRL SS (BLADE) ×2
CHLORAPREP W/TINT 26ML (MISCELLANEOUS) ×2 IMPLANT
CLOTH BEACON ORANGE TIMEOUT ST (SAFETY) ×2 IMPLANT
CONT SPEC 4OZ CLIKSEAL STRL BL (MISCELLANEOUS) IMPLANT
CONT SPECI 4OZ STER CLIK (MISCELLANEOUS) ×2 IMPLANT
COVER SURGICAL LIGHT HANDLE (MISCELLANEOUS) ×2 IMPLANT
DECANTER SPIKE VIAL GLASS SM (MISCELLANEOUS) ×1 IMPLANT
DEVICE DUBIN SPECIMEN MAMMOGRA (MISCELLANEOUS) IMPLANT
DRAPE PED LAPAROTOMY (DRAPES) ×2 IMPLANT
DRAPE UTILITY 15X26 W/TAPE STR (DRAPE) ×4 IMPLANT
DRSG OPSITE 4X5.5 SM (GAUZE/BANDAGES/DRESSINGS) ×1 IMPLANT
DRSG OPSITE 6X11 MED (GAUZE/BANDAGES/DRESSINGS) ×2 IMPLANT
ELECT CAUTERY BLADE 6.4 (BLADE) ×2 IMPLANT
ELECT REM PT RETURN 9FT ADLT (ELECTROSURGICAL) ×2
ELECTRODE REM PT RTRN 9FT ADLT (ELECTROSURGICAL) ×1 IMPLANT
GAUZE SPONGE 2X2 8PLY STRL LF (GAUZE/BANDAGES/DRESSINGS) IMPLANT
GAUZE SPONGE 4X4 16PLY XRAY LF (GAUZE/BANDAGES/DRESSINGS) ×1 IMPLANT
GLOVE BIO SURGEON STRL SZ7 (GLOVE) ×2 IMPLANT
GLOVE BIOGEL PI IND STRL 7.5 (GLOVE) ×1 IMPLANT
GLOVE BIOGEL PI INDICATOR 7.5 (GLOVE) ×2
GLOVE SURG SS PI 7.0 STRL IVOR (GLOVE) ×1 IMPLANT
GOWN STRL NON-REIN LRG LVL3 (GOWN DISPOSABLE) ×4 IMPLANT
KIT BASIN OR (CUSTOM PROCEDURE TRAY) ×2 IMPLANT
KIT MARKER MARGIN INK (KITS) ×2 IMPLANT
KIT ROOM TURNOVER OR (KITS) ×2 IMPLANT
NDL HYPO 25GX1X1/2 BEV (NEEDLE) ×1 IMPLANT
NEEDLE HYPO 25GX1X1/2 BEV (NEEDLE) ×2 IMPLANT
NS IRRIG 1000ML POUR BTL (IV SOLUTION) ×2 IMPLANT
PACK SURGICAL SETUP 50X90 (CUSTOM PROCEDURE TRAY) ×2 IMPLANT
PAD ARMBOARD 7.5X6 YLW CONV (MISCELLANEOUS) ×2 IMPLANT
PENCIL BUTTON HOLSTER BLD 10FT (ELECTRODE) ×2 IMPLANT
SPONGE GAUZE 2X2 STER 10/PKG (GAUZE/BANDAGES/DRESSINGS) ×1
SPONGE GAUZE 4X4 12PLY (GAUZE/BANDAGES/DRESSINGS) ×1 IMPLANT
STRIP CLOSURE SKIN 1/2X4 (GAUZE/BANDAGES/DRESSINGS) ×1 IMPLANT
STRIP CLOSURE SKIN 1/4X4 (GAUZE/BANDAGES/DRESSINGS) ×1 IMPLANT
SUT MNCRL AB 4-0 PS2 18 (SUTURE) ×2 IMPLANT
SUT SILK 2 0 SH (SUTURE) IMPLANT
SUT VIC AB 3-0 SH 27 (SUTURE) ×2
SUT VIC AB 3-0 SH 27XBRD (SUTURE) ×1 IMPLANT
SYR BULB 3OZ (MISCELLANEOUS) ×2 IMPLANT
SYR CONTROL 10ML LL (SYRINGE) ×2 IMPLANT
TOWEL OR 17X24 6PK STRL BLUE (TOWEL DISPOSABLE) ×2 IMPLANT
TOWEL OR 17X26 10 PK STRL BLUE (TOWEL DISPOSABLE) ×2 IMPLANT
TUBE CONNECTING 12X1/4 (SUCTIONS) IMPLANT
WATER STERILE IRR 1000ML POUR (IV SOLUTION) IMPLANT
YANKAUER SUCT BULB TIP NO VENT (SUCTIONS) IMPLANT

## 2012-04-11 NOTE — Op Note (Signed)
Preop diagnosis: DCIS right breast Postop diagnosis: Same Procedure performed: Reexcision of inferior and posterior margins right breast Surgeon:Daiquan Resnik K. Indications: This is a 61 year old female who recently underwent a right needle localized lumpectomy and right sentinel lymph node biopsy for DCIS. The lymph node was negative. The posterior and inferior margins were close so the decision was made to proceed with reexcision of the margins. No invasive cancer was found.  Description of procedure: The patient was brought to the operating room and placed in a supine position on the operating room table. After an adequate level of general anesthesia was obtained the patient's right breast was prepped with chlor prep and draped in sterile fashion. A timeout was taken to ensure the proper patient and proper procedure. We infiltrated the area around the previous incision with quarter percent Marcaine with epinephrine. We open her previous incision. Dissection was carried down into the biopsy cavity. We aspirated the seroma. I then proceeded to take an additional centimeter of inferior margin. This was oriented with a paint kit. We then took another centimeter of posterior margin. This was also oriented with the paint kit. The specimens were sent for pathologic examination. The wound was irrigated and inspected for hemostasis. The wound was closed with a deep layer of 3-0 Vicryl and a subcuticular layer of 4-0 Monocryl. Steri-Strips and clean dressings were applied. The patient was then extubated and brought to the recovery room in stable condition. All sponge, initially, and needle counts are correct.  [name]. Andrea Skains, MD, [facility]  04/11/2012 4:52 PM

## 2012-04-11 NOTE — Anesthesia Postprocedure Evaluation (Signed)
Anesthesia Post Note  Patient: [name]  Procedure(s) Performed: Procedure(s) (LRB): RE-EXCISION OF BREAST LUMPECTOMY (Right)  Anesthesia type: General  Patient location: PACU  Post pain: Pain level controlled  Post assessment: Patient's Cardiovascular Status Stable  Last Vitals:  Filed Vitals:   04/11/12 1701  BP: 112/70  Pulse: 105  Temp: 36.4 C  Resp: 15    Post vital signs: Reviewed and stable  Level of consciousness: alert  Complications: No apparent anesthesia complications

## 2012-04-11 NOTE — Anesthesia Preprocedure Evaluation (Addendum)
Anesthesia Evaluation  Patient identified by MRN, date of birth, ID band Patient awake    Reviewed: Allergy & Precautions, H&P , NPO status , Patient's Chart, lab work & pertinent test results  History of Anesthesia Complications Negative for: history of anesthetic complications  Airway Mallampati: II TM Distance: >3 FB Neck ROM: Full    Dental  (+) Missing and Dental Advisory Given   Pulmonary neg pulmonary ROS, shortness of breath and with exertion,    Pulmonary exam normal       Cardiovascular hypertension, Pt. on medications Rhythm:Regular     Neuro/Psych negative neurological ROS     GI/Hepatic Neg liver ROS, hiatal hernia, GERD-  Medicated and Controlled,  Endo/Other  Type 2, Oral Hypoglycemic Agents  Renal/GU negative Renal ROS     Musculoskeletal   Abdominal   Peds  Hematology   Anesthesia Other Findings   Reproductive/Obstetrics                          Anesthesia Physical Anesthesia Plan  ASA: III  Anesthesia Plan: General   Post-op Pain Management:    Induction:   Airway Management Planned: LMA  Additional Equipment:   Intra-op Plan:   Post-operative Plan: Extubation in OR  Informed Consent: I have reviewed the patients History and Physical, chart, labs and discussed the procedure including the risks, benefits and alternatives for the proposed anesthesia with the patient or authorized representative who has indicated his/her understanding and acceptance.   Dental advisory given  Plan Discussed with: CRNA, Anesthesiologist and Surgeon  Anesthesia Plan Comments:         Anesthesia Quick Evaluation

## 2012-04-11 NOTE — Preoperative (Signed)
Beta Blockers   Reason not to administer Beta Blockers:Not Applicable 

## 2012-04-11 NOTE — Transfer of Care (Signed)
Immediate Anesthesia Transfer of Care Note  Patient: [name]  Procedure(s) Performed: Procedure(s) (LRB): RE-EXCISION OF BREAST LUMPECTOMY (Right)  Patient Location: PACU  Anesthesia Type: General  Level of Consciousness: awake  Airway & Oxygen Therapy: Patient Spontanous Breathing and Patient connected to nasal cannula oxygen  Post-op Assessment: Report given to PACU RN and Post -op Vital signs reviewed and stable  Post vital signs: stable  Complications: No apparent anesthesia complications

## 2012-04-11 NOTE — H&P (Signed)
Patient ID: [name], female   DOB: 23-Feb-1951, 61 y.o.   MRN: [id number]    Chief Complaint   Patient presents with   .  New Evaluation       New Br. Ca Pt. Rt Br DCIS        HPI [name] is a 61 y.o. female.  Referred by Dr Dorothyann Peng HPI 61 yo female presents after recent routine screening mammogram. She was noted to have an abnormality in the right upper outer quadrant of her breast. She underwent further workup with repeat mammogram and ultrasound. She underwent core biopsy which showed DCIS. The prognostic profile is pending. MRI showed a 1.4 cm lesion with no sign of multicentric or contralateral disease. Lymph nodes appear to be negative. She presents now for surgical evaluation. She is alone today.      Past Medical History   Diagnosis  Date   .  Chest pain     .  Bradycardia     .  Iron deficiency anemia     .  Hiatal hernia         large   .  Shoulder pain, left     .  Hypertension     .  Diabetes mellitus         type 2   .  Morbid obesity     .  GERD (gastroesophageal reflux disease)     .  Blood transfusion           Past Surgical History   Procedure  Date   .  Breast lumpectomy  04/1997         Family History   Problem  Relation  Age of Onset   .  Heart attack  Father     .  Heart disease  Father         heart attack   .  Cancer  Sister         breast        Social History History   Substance Use Topics   .  Smoking status:  Never Smoker    .  Smokeless tobacco:  Never Used   .  Alcohol Use:  No        No Known Allergies    Current Outpatient Prescriptions   Medication  Sig  Dispense  Refill   .  cetirizine (ZYRTEC) 10 MG chewable tablet  Chew 10 mg by mouth daily.           .  iron polysaccharides (NIFEREX) 150 MG capsule  Take 150 mg by mouth 2 (two) times daily.           .  lansoprazole (PREVACID) 30 MG capsule  Take 30 mg by mouth daily.           .  Olmesartan-Amlodipine-HCTZ (TRIBENZOR) 40-10-25 MG TABS   Take 1 tablet by mouth daily.           .  pioglitazone-metformin (ACTOPLUS MET) 15-850 MG per tablet  Take 1 tablet by mouth 2 (two) times daily with a meal.           .  aspirin 81 MG tablet  Take 81 mg by mouth daily.                Review of Systems Review of Systems  Constitutional: Negative for fever, chills and unexpected weight change.  HENT: Positive for congestion. Negative for  hearing loss, sore throat, trouble swallowing and voice change.   Eyes: Negative for visual disturbance.  Respiratory: Negative for cough and wheezing.   Cardiovascular: Positive for leg swelling. Negative for chest pain and palpitations.  Gastrointestinal: Negative for nausea, vomiting, abdominal pain, diarrhea, constipation, blood in stool, abdominal distention and anal bleeding.  Genitourinary: Negative for hematuria, vaginal bleeding and difficulty urinating.  Musculoskeletal: Negative for arthralgias.  Skin: Negative for rash and wound.  Neurological: Negative for seizures, syncope and headaches.  Hematological: Negative for adenopathy. Does not bruise/bleed easily.  Psychiatric/Behavioral: Negative for confusion.      Blood pressure 152/96, pulse 100, temperature 97.6 F (36.4 C), temperature source Temporal, resp. rate 14, height 5' 2.5" (1.588 m), weight 233 lb 9.6 oz (105.96 kg).   Physical Exam Physical Exam WDWN in NAD HEENT - [place]/AT; EOMI, sclera anicteric Lungs - CTA B; normal respiratory effort CV - RRR Abd - soft, non-tender; no masses Breasts - symmetric; no nipple retraction or discharge; no axillary lymphadenopathy on either side.  RUOQ - healing incision with no sign of infection Mammogram/ U/S/ MRI   Assessment    Right breast DCIS - upper outer quadrant      Plan  Reexcision for wider margins - right breast.  The surgical procedure has been discussed with the patient.  Potential risks, benefits, alternative treatments, and expected outcomes have been explained.   All of the patient's questions at this time have been answered.  The likelihood of reaching the patient's treatment goal is good.  The patient understand the proposed surgical procedure and wishes to proceed.        [name]. Andrea Skains, MD, [facility]  04/11/2012 12:58 PM

## 2012-04-11 NOTE — Interval H&P Note (Signed)
History and Physical Interval Note:  04/11/2012 12:59 PM  [name]  has presented today for surgery, with the diagnosis of right breast DCIS  The various methods of treatment have been discussed with the patient and family. After consideration of risks, benefits and other options for treatment, the patient has consented to  Procedure(s) (LRB): RE-EXCISION OF BREAST LUMPECTOMY (Right) as a surgical intervention .  The patient's history has been reviewed, patient examined, no change in status, stable for surgery.  I have reviewed the patients' chart and labs.  Questions were answered to the patient's satisfaction.     [name] K.

## 2012-04-13 LAB — GLUCOSE, CAPILLARY: Glucose-Capillary: 103 mg/dL — ABNORMAL HIGH (ref 70–99)

## 2012-04-14 ENCOUNTER — Telehealth: Payer: Self-pay | Admitting: *Deleted

## 2012-04-14 NOTE — Telephone Encounter (Signed)
04/14/12. Patient returned phone call today as requested. Discussed her possible participation in the B-43 clinical trial for women with DCIS. Presented information about the purpose, treatment plan and consent process.  She has agreed to read the protocol consents and think about participating. Copies of the tissue submission and treatment consent mailed to her home address today.

## 2012-05-04 ENCOUNTER — Ambulatory Visit
Admission: RE | Admit: 2012-05-04 | Discharge: 2012-05-04 | Disposition: A | Discharge status: Home / Self Care | Payer: BC Managed Care – PPO | Source: Ambulatory Visit | Attending: Radiation Oncology | Admitting: Radiation Oncology

## 2012-05-04 ENCOUNTER — Encounter: Payer: Self-pay | Admitting: Radiation Oncology

## 2012-05-04 ENCOUNTER — Ambulatory Visit: Payer: BC Managed Care – PPO

## 2012-05-04 ENCOUNTER — Ambulatory Visit: Payer: BC Managed Care – PPO | Admitting: Radiation Oncology

## 2012-05-04 VITALS — BP 113/67 | HR 105 | Temp 97.8°F | Resp 20 | Wt 238.2 lb

## 2012-05-04 DIAGNOSIS — D0510 Intraductal carcinoma in situ of unspecified breast: Secondary | ICD-10-CM

## 2012-05-04 DIAGNOSIS — C50419 Malignant neoplasm of upper-outer quadrant of unspecified female breast: Secondary | ICD-10-CM | POA: Insufficient documentation

## 2012-05-04 NOTE — Progress Notes (Signed)
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 female who is status post lumpectomy with reexcision for close margins of DCIS. The lumpectomy of her right breast DCIS was performed on 03/16/2012. The pathology is notable for that described above. The amount of DCIS measured 1.8 cm. She had close margins /reexcision was performed on 04/11/2012 with no residual malignant cells found.  The patient self palpated a lump in her right breast and was found to have an abnormality in the breast her a mammogram dated 01/21/2012.  The mammogram demonstrated possible masses in her right breast. Additional evaluation was performed with spot compression views on 02/04/2012 which demonstrated a somewhat irregular mass with indistinct medial posterior margins and a few internal faint calcifications. Ultrasound demonstrated a 1.2 cm mass at the 10:00 position. Biopsy revealed DCIS. MRI of the breast on 02/19/2012 demonstrated enhancement in the upper-outer quadrant of the right breast consistent with DCIS measuring 1.3x1.4 x  0.8 cm. It was negative for adenopathy or findings in the left breast.   Today, she is in her usual state of health. She is recovering well from surgery. She has met with medical oncology. They have discussed tamoxifen, the patient will consider more seriously when she meets with medical oncology again in October. She is not interested in any clinical trials.  PREVIOUS RADIATION THERAPY: No  PAST MEDICAL HISTORY:  has a past medical history of Bradycardia; Iron deficiency anemia; Hiatal hernia; Diabetes mellitus;  Morbid obesity; GERD (gastroesophageal reflux disease); Shortness of breath; Sinus drainage; Breast cancer (03/16/12); Cancer; Hypertension; and Blood transfusion (03/14/12).    PAST SURGICAL HISTORY: Past Surgical History  Procedure Date  . Breast lumpectomy 04/1997  . Biospy right breast 02/01/12    Right Breast Needle Core Biopsy: ductal Carcinoma In situ with Papillary Features, ER/PR Positive  . Lumpectomy right breast 03/16/12    Ductal carcinoma In situ: clear margins: 0/1 Node Negative    FAMILY HISTORY: family history includes Cancer in her sister; Coronary artery disease in her father; and Lymphoma in her sister.  SOCIAL HISTORY:  reports that she has never smoked. She has never used smokeless tobacco. She reports that she does not drink alcohol or use illicit drugs.  ALLERGIES: Review of patient's allergies indicates no known allergies.  MEDICATIONS:  Current Outpatient Prescriptions  Medication Sig Dispense Refill  . cetirizine (ZYRTEC) 10 MG tablet Take 10 mg by mouth daily as needed. For allergies.      [name] iron polysaccharides (NIFEREX) 150 MG capsule Take 150 mg by mouth 2 (two) times daily.        . lansoprazole (PREVACID) 30 MG capsule Take 30 mg by mouth as needed.       . naproxen sodium (ANAPROX) 220 MG tablet Take 220 mg by mouth as needed.      . Olmesartan-Amlodipine-HCTZ (TRIBENZOR) 40-10-25 MG TABS Take 1 tablet by mouth daily.       . pioglitazone-metformin (ACTOPLUS MET) 15-850 MG per tablet Take 1 tablet by mouth 2 (two) times daily with a meal.       . omeprazole (PRILOSEC) 20 MG capsule Take 20 mg by mouth daily  as needed. Alternates with prevacid depending upon cost For reflux        REVIEW OF SYSTEMS: review of systems is notable for that described above   PHYSICAL EXAM:  weight is 238 lb 3.2 oz (108.047 kg). Her oral temperature is 97.8 F (36.6 C). Her blood pressure is 113/67 and her pulse is 105. Her respiration is 20.   General: Alert and oriented, in  no acute distress HEENT: Head is normocephalic. Pupils are equally round and reactive to light. Extraocular movements are intact. Oropharynx is clear. Neck: Neck is supple, no palpable cervical or supraclavicular lymphadenopathy. Heart: Regular in rate and rhythm a soft murmur loudest in the aortic region but radiating through the precordium  Chest: Clear to auscultation bilaterally, with no rhonchi, wheezes, or rales. Abdomen: Soft, nontender, nondistended, with no rigidity or guarding. Extremities: She has lower extremity edema in her ankles  Lymphatics: No concerning lymphadenopathy. Skin: No concerning lesions. Musculoskeletal: symmetric strength and muscle tone throughout. Neurologic: Cranial nerves II through XII are grossly intact. No obvious focalities. Speech is fluent. Coordination is intact. Psychiatric: Judgment and insight are intact. Affect is appropriate. Breasts: The patient has a well-healed lumpectomy scar at the 9:00 position of the right breast. No palpable lesions of concern in either breast. No palpable axillary adenopathy.    LABORATORY DATA:  Lab Results  Component Value Date   WBC 6.4 04/08/2012   HGB 7.7* 04/08/2012   HCT 26.5* 04/08/2012   MCV 62.1* 04/08/2012   PLT 323 04/08/2012   Lab Results  Component Value Date   NA 139 04/08/2012   K 4.1 04/08/2012   CL 104 04/08/2012   CO2 27 04/08/2012   Lab Results  Component Value Date   ALT 16 03/29/2012   AST 17 03/29/2012   ALKPHOS 69 03/29/2012   BILITOT 0.2* 03/29/2012     RADIOGRAPHY:  as above    IMPRESSION/PLAN: Is a delightful 61 year old woman with right breast DCIS.  I think she would be an excellent candidate for adjuvant radiation to decrease her chance of a local recurrence.  It was a pleasure meeting the patient today. We discussed the risks, benefits, and side effects of radiotherapy. We discussed that radiation would take approximately 6 weeks to complete. We spoke about acute effects including skin  irritation and fatigue as well as much less common late effects including lung  irritation. We spoke about the latest technology that is used to minimize the risk of late effects for breast cancer patients undergoing radiotherapy. No guarantees of treatment were given. The patient is enthusiastic about proceeding with treatment. I look forward to participating in the patient's care.  I have contacted radiology to make sure postoperative mammogram is not necessary before treatment starts. I anticipate treatment planning on August 9 and starting treatment approximately a week thereafter.   I spent 60 minutes minutes face to face with the patient and more than 50% of that time was spent in counseling and/or coordination of care.    -----------------------------------  Lonie Peak, MD

## 2012-05-04 NOTE — Addendum Note (Signed)
Encounter addended by: Lonie Peak, MD on: 05/04/2012  8:49 PM<BR>     Documentation filed: Inpatient Notes, Notes Section

## 2012-05-04 NOTE — Progress Notes (Signed)
Please see the Nurse Progress Note in the MD Initial Consult Encounter for this patient. 

## 2012-05-04 NOTE — Progress Notes (Signed)
New consult: Right Breast Cancer   Re-excision of inferior and posterior margins right breast 04/11/12= benign no malignancy Right lumpectomy  03/16/2012 = high grade Ductal Carcinoma in Situ involving  Ductal Papilloma,ER/PR=positive    Married, 1 child, works at [employer], alert,oriented x3, steady gait, no c/o pain      Allergies:NKDA

## 2012-05-06 ENCOUNTER — Encounter (INDEPENDENT_AMBULATORY_CARE_PROVIDER_SITE_OTHER): Payer: BC Managed Care – PPO | Admitting: Surgery

## 2012-05-06 ENCOUNTER — Ambulatory Visit: Admission: RE | Admit: 2012-05-06 | Payer: BC Managed Care – PPO | Source: Ambulatory Visit

## 2012-05-06 ENCOUNTER — Ambulatory Visit
Admission: RE | Admit: 2012-05-06 | Discharge: 2012-05-06 | Disposition: A | Discharge status: Home / Self Care | Payer: BC Managed Care – PPO | Source: Ambulatory Visit | Attending: Radiation Oncology | Admitting: Radiation Oncology

## 2012-05-06 DIAGNOSIS — C50419 Malignant neoplasm of upper-outer quadrant of unspecified female breast: Secondary | ICD-10-CM

## 2012-05-06 NOTE — Progress Notes (Signed)
SIMULATION / TREATMENT PLANNING NOTE:   Diagnosis: Right breast cancer  The patient was taken to the CT simulator and laid in the supine position, arms over her head, and her head in an Accuform device. Adhesive wiring around the borders of her right breast tissue and over her lumpectomy scar. High resolution CT axial imaging was obtained of the patient's right breast and chest.  An isocenter was placed in the anterior right lung.   I plan to treat the patient's right breast with opposed tangents, using MLCs for custom blocks, to a dose of 50Gy/25 fractions. This will be followed by a boost of 10 gray in 5 fractions the lumpectomy cavity.

## 2012-05-13 ENCOUNTER — Ambulatory Visit
Admission: RE | Admit: 2012-05-13 | Discharge: 2012-05-13 | Disposition: A | Discharge status: Home / Self Care | Payer: BC Managed Care – PPO | Source: Ambulatory Visit | Attending: Radiation Oncology | Admitting: Radiation Oncology

## 2012-05-13 DIAGNOSIS — C50419 Malignant neoplasm of upper-outer quadrant of unspecified female breast: Secondary | ICD-10-CM

## 2012-05-13 NOTE — Progress Notes (Signed)
  Radiation Oncology         [phone]) [phone] ________________________________  Name: [name] MRN: [id number]  Date: 05/13/2012  DOB: [birth year]  Simulation Verification Note  Status: Outpatient  NARRATIVE: The patient was brought to the treatment unit and placed in the planned treatment position. The clinical setup was verified. Then port films were obtained and uploaded to the radiation oncology medical record software.  The treatment beams were carefully compared against the planned radiation fields. The position location and shape of the radiation fields was reviewed. They targeted volume of tissue appears to be appropriately covered by the radiation beams. Organs at risk appear to be excluded as planned.  Based on my personal review, I approved the simulation verification. The patient's treatment will proceed as planned.  -----------------------------------  Billie Lade, PhD, MD

## 2012-05-16 ENCOUNTER — Ambulatory Visit
Admission: RE | Admit: 2012-05-16 | Discharge: 2012-05-16 | Disposition: A | Discharge status: Home / Self Care | Payer: BC Managed Care – PPO | Source: Ambulatory Visit | Attending: Radiation Oncology | Admitting: Radiation Oncology

## 2012-05-16 ENCOUNTER — Ambulatory Visit
Admission: RE | Admit: 2012-05-16 | Payer: BC Managed Care – PPO | Source: Ambulatory Visit | Admitting: Radiation Oncology

## 2012-05-16 DIAGNOSIS — C50919 Malignant neoplasm of unspecified site of unspecified female breast: Secondary | ICD-10-CM

## 2012-05-16 NOTE — Progress Notes (Signed)
Education today regarding side-effect management for radiation treatment to the right breast.  Reviewed how radiation works to treat her breast cancer, potential side-effects to skin of her right breast with management strategies, management of treatment related fatigue and management and reporting of pain. Marked specific pages in the Radiation Therapy and You Booklet. Used Teach back method of education.  Given radiaplex to use as her lotion for the treatment side and given Alra, non-metallic deodorant.  Informed that Dr. Basilio Cairo assesses her patient every Monday and if this changes she will be notified in advance.

## 2012-05-17 ENCOUNTER — Ambulatory Visit
Admission: RE | Admit: 2012-05-17 | Discharge: 2012-05-17 | Disposition: A | Discharge status: Home / Self Care | Payer: BC Managed Care – PPO | Source: Ambulatory Visit | Attending: Radiation Oncology | Admitting: Radiation Oncology

## 2012-05-17 ENCOUNTER — Encounter: Payer: Self-pay | Admitting: Radiation Oncology

## 2012-05-17 VITALS — BP 134/82 | HR 93 | Temp 97.9°F | Wt 240.0 lb

## 2012-05-17 DIAGNOSIS — C50419 Malignant neoplasm of upper-outer quadrant of unspecified female breast: Secondary | ICD-10-CM

## 2012-05-17 NOTE — Progress Notes (Signed)
Weekly Management Note:  Site:R Breast Current Dose:  400  cGy Projected Dose: 6000  cGy  Narrative: The patient is seen today for routine under treatment assessment. CBCT/MVCT images/port films were reviewed. The chart was reviewed.   She is without complaints today. She has Radioplex gel to use when necessary.  Physical Examination:  Filed Vitals:   05/17/12 1743  BP: 134/82  Pulse: 93  Temp: 97.9 F (36.6 C)  .  Weight: 240 lb (108.863 kg). No significant skin changes.  Impression: Tolerating radiation therapy well.  Plan: Continue radiation therapy as planned.

## 2012-05-17 NOTE — Progress Notes (Signed)
2/25 fractions to right breast.  No voiced complaints.

## 2012-05-18 ENCOUNTER — Ambulatory Visit
Admission: RE | Admit: 2012-05-18 | Discharge: 2012-05-18 | Disposition: A | Discharge status: Home / Self Care | Payer: BC Managed Care – PPO | Source: Ambulatory Visit | Attending: Radiation Oncology | Admitting: Radiation Oncology

## 2012-05-19 ENCOUNTER — Ambulatory Visit
Admission: RE | Admit: 2012-05-19 | Discharge: 2012-05-19 | Disposition: A | Discharge status: Home / Self Care | Payer: BC Managed Care – PPO | Source: Ambulatory Visit | Attending: Radiation Oncology | Admitting: Radiation Oncology

## 2012-05-19 ENCOUNTER — Ambulatory Visit (INDEPENDENT_AMBULATORY_CARE_PROVIDER_SITE_OTHER): Payer: BC Managed Care – PPO | Admitting: Surgery

## 2012-05-19 ENCOUNTER — Encounter (INDEPENDENT_AMBULATORY_CARE_PROVIDER_SITE_OTHER): Payer: Self-pay | Admitting: Surgery

## 2012-05-19 VITALS — BP 140/70 | HR 100 | Temp 98.2°F | Resp 16 | Ht 62.5 in | Wt 242.6 lb

## 2012-05-19 DIAGNOSIS — D0590 Unspecified type of carcinoma in situ of unspecified breast: Secondary | ICD-10-CM

## 2012-05-19 DIAGNOSIS — D0510 Intraductal carcinoma in situ of unspecified breast: Secondary | ICD-10-CM

## 2012-05-19 NOTE — Progress Notes (Signed)
Status post right lumpectomy followed by reexcision of close margins. The patient has started her radiation therapy which is going well. Her incision is well healed with no sign of infection. No hematoma or seroma. She does have some early thickening of the skin from the radiation changes. She has full range of motion of her arm and shoulder. No significant tenderness around the incision.  DCIS - right breast  Plan:  Follow-up 6 months  Wilmon Arms. Corliss Skains, MD, [facility]  05/19/2012 5:13 PM

## 2012-05-20 ENCOUNTER — Ambulatory Visit
Admission: RE | Admit: 2012-05-20 | Discharge: 2012-05-20 | Disposition: A | Discharge status: Home / Self Care | Payer: BC Managed Care – PPO | Source: Ambulatory Visit | Attending: Radiation Oncology | Admitting: Radiation Oncology

## 2012-05-23 ENCOUNTER — Ambulatory Visit
Admission: RE | Admit: 2012-05-23 | Discharge: 2012-05-23 | Disposition: A | Discharge status: Home / Self Care | Payer: BC Managed Care – PPO | Source: Ambulatory Visit | Attending: Radiation Oncology | Admitting: Radiation Oncology

## 2012-05-24 ENCOUNTER — Ambulatory Visit
Admission: RE | Admit: 2012-05-24 | Discharge: 2012-05-24 | Disposition: A | Discharge status: Home / Self Care | Payer: BC Managed Care – PPO | Source: Ambulatory Visit | Attending: Radiation Oncology | Admitting: Radiation Oncology

## 2012-05-25 ENCOUNTER — Ambulatory Visit
Admission: RE | Admit: 2012-05-25 | Discharge: 2012-05-25 | Disposition: A | Discharge status: Home / Self Care | Payer: BC Managed Care – PPO | Source: Ambulatory Visit | Attending: Radiation Oncology | Admitting: Radiation Oncology

## 2012-05-25 VITALS — BP 144/74 | HR 94 | Temp 98.5°F | Wt 239.2 lb

## 2012-05-25 DIAGNOSIS — C50419 Malignant neoplasm of upper-outer quadrant of unspecified female breast: Secondary | ICD-10-CM

## 2012-05-25 NOTE — Progress Notes (Signed)
   Weekly Management Note Current Dose:   16Gy  Projected Dose: 60 Gy   Narrative:  The patient presents for routine under treatment assessment.  CBCT/MVCT images/Port film x-rays were reviewed.  The chart was checked. Doing well. Recently started vitamin D for deficiency.  Physical Findings:  weight is 239 lb 3.2 oz (108.5 kg). Her temperature is 98.5 F (36.9 C). Her blood pressure is 144/74 and her pulse is 94.  Skin appears mildly hyperpigmented over the right breast  Impression:  The patient is tolerating radiotherapy.  Plan:  Continue radiotherapy as planned.  ________________________________   Lonie Peak, M.D.

## 2012-05-25 NOTE — Progress Notes (Signed)
Patient here for weekly under treat visit for right breast radiation.Has completed 8 of 30 treatments.Has mild hyperpigmentation.No breaks in skin.Moderate fatigue.Has started vitamin d.To continue application of radiaplex.

## 2012-05-26 ENCOUNTER — Ambulatory Visit
Admission: RE | Admit: 2012-05-26 | Discharge: 2012-05-26 | Disposition: A | Discharge status: Home / Self Care | Payer: BC Managed Care – PPO | Source: Ambulatory Visit | Attending: Radiation Oncology | Admitting: Radiation Oncology

## 2012-05-27 ENCOUNTER — Ambulatory Visit
Admission: RE | Admit: 2012-05-27 | Discharge: 2012-05-27 | Disposition: A | Discharge status: Home / Self Care | Payer: BC Managed Care – PPO | Source: Ambulatory Visit | Attending: Radiation Oncology | Admitting: Radiation Oncology

## 2012-05-31 ENCOUNTER — Ambulatory Visit
Admission: RE | Admit: 2012-05-31 | Discharge: 2012-05-31 | Disposition: A | Discharge status: Home / Self Care | Payer: BC Managed Care – PPO | Source: Ambulatory Visit | Attending: Radiation Oncology | Admitting: Radiation Oncology

## 2012-06-01 ENCOUNTER — Ambulatory Visit
Admission: RE | Admit: 2012-06-01 | Discharge: 2012-06-01 | Disposition: A | Discharge status: Home / Self Care | Payer: BC Managed Care – PPO | Source: Ambulatory Visit | Attending: Radiation Oncology | Admitting: Radiation Oncology

## 2012-06-01 ENCOUNTER — Encounter: Payer: Self-pay | Admitting: Radiation Oncology

## 2012-06-01 VITALS — BP 135/74 | HR 95 | Temp 98.0°F | Wt 238.5 lb

## 2012-06-01 DIAGNOSIS — C50419 Malignant neoplasm of upper-outer quadrant of unspecified female breast: Secondary | ICD-10-CM

## 2012-06-01 NOTE — Progress Notes (Signed)
   Weekly Management Note Current Dose:  24 Gy  Projected Dose: 60 Gy   Narrative:  The patient presents for routine under treatment assessment.  CBCT/MVCT images/Port film x-rays were reviewed.  The chart was checked. She is well, a little tired. Working full time. Using radiaplex. Shooting pains occur intermittently in the breast  Physical Findings:  weight is 238 lb 8 oz (108.183 kg). Her temperature is 98 F (36.7 C). Her blood pressure is 135/74 and her pulse is 95.  right breast is hyperpigmented. Skin is intact.  Impression:  The patient is tolerating radiotherapy.  Plan:  Continue radiotherapy as planned.  ________________________________   Lonie Peak, M.D.

## 2012-06-01 NOTE — Progress Notes (Signed)
12 fractions to right breast.  Hyperpigmentation noted.  Skin intact.  C/o intermittent shooting pains around the areola region.  Notes more fatigue since she works full-time.

## 2012-06-02 ENCOUNTER — Ambulatory Visit
Admission: RE | Admit: 2012-06-02 | Discharge: 2012-06-02 | Disposition: A | Discharge status: Home / Self Care | Payer: BC Managed Care – PPO | Source: Ambulatory Visit | Attending: Radiation Oncology | Admitting: Radiation Oncology

## 2012-06-03 ENCOUNTER — Ambulatory Visit
Admission: RE | Admit: 2012-06-03 | Discharge: 2012-06-03 | Disposition: A | Discharge status: Home / Self Care | Payer: BC Managed Care – PPO | Source: Ambulatory Visit | Attending: Radiation Oncology | Admitting: Radiation Oncology

## 2012-06-06 ENCOUNTER — Ambulatory Visit: Payer: BC Managed Care – PPO

## 2012-06-07 ENCOUNTER — Ambulatory Visit
Admission: RE | Admit: 2012-06-07 | Discharge: 2012-06-07 | Disposition: A | Discharge status: Home / Self Care | Payer: BC Managed Care – PPO | Source: Ambulatory Visit | Attending: Radiation Oncology | Admitting: Radiation Oncology

## 2012-06-07 NOTE — Progress Notes (Signed)
patient here weekly rad txs right breast, 15/30 completed,alert,oriented x3, hyperpigmentation on right breast,skin intact, tingling occasionally around nipple area stated No c/o pain, using radiaplex gel bid 9:33 AM

## 2012-06-08 ENCOUNTER — Ambulatory Visit
Admission: RE | Admit: 2012-06-08 | Discharge: 2012-06-08 | Disposition: A | Discharge status: Home / Self Care | Payer: BC Managed Care – PPO | Source: Ambulatory Visit | Attending: Radiation Oncology | Admitting: Radiation Oncology

## 2012-06-09 ENCOUNTER — Ambulatory Visit
Admission: RE | Admit: 2012-06-09 | Discharge: 2012-06-09 | Disposition: A | Discharge status: Home / Self Care | Payer: BC Managed Care – PPO | Source: Ambulatory Visit | Attending: Radiation Oncology | Admitting: Radiation Oncology

## 2012-06-10 ENCOUNTER — Ambulatory Visit
Admission: RE | Admit: 2012-06-10 | Discharge: 2012-06-10 | Disposition: A | Discharge status: Home / Self Care | Payer: BC Managed Care – PPO | Source: Ambulatory Visit | Attending: Radiation Oncology | Admitting: Radiation Oncology

## 2012-06-13 ENCOUNTER — Encounter: Payer: Self-pay | Admitting: Radiation Oncology

## 2012-06-13 ENCOUNTER — Ambulatory Visit
Admission: RE | Admit: 2012-06-13 | Discharge: 2012-06-13 | Disposition: A | Discharge status: Home / Self Care | Payer: BC Managed Care – PPO | Source: Ambulatory Visit | Attending: Radiation Oncology | Admitting: Radiation Oncology

## 2012-06-13 VITALS — BP 138/82 | HR 91 | Temp 97.9°F | Resp 18 | Wt 238.9 lb

## 2012-06-13 DIAGNOSIS — C50419 Malignant neoplasm of upper-outer quadrant of unspecified female breast: Secondary | ICD-10-CM

## 2012-06-13 MED ORDER — RADIAPLEXRX EX GEL
Freq: Once | CUTANEOUS | Status: AC
  Administered 2012-06-13: 09:00:00 via TOPICAL

## 2012-06-13 NOTE — Progress Notes (Signed)
   Weekly Management Note - R breast Current Dose:  38 Gy  Projected Dose: 60 Gy   Narrative:  The patient presents for routine under treatment assessment.  CBCT/MVCT images/Port film x-rays were reviewed.  The chart was checked. She is doing well. She reports some soreness over her right nipple.  Physical Findings:  weight is 238 lb 14.4 oz (108.364 kg). Her oral temperature is 97.9 F (36.6 C). Her blood pressure is 138/82 and her pulse is 91. Her respiration is 18.  right breast shows early hyperpigmentation. No desquamation.  Impression:  The patient is tolerating radiotherapy.  Plan:  Continue radiotherapy as planned.  ________________________________   Lonie Peak, M.D.

## 2012-06-13 NOTE — Progress Notes (Signed)
Patient presents to the clinic today unaccompanied for PUT with Dr. Basilio Cairo. Patient alert and oriented to person, place, and time. No distress noted. Steady gait noted. Pleasant affect noted. Patient denies pain at this time. However, patient reports swelling and tingling of the right nipple. Hyperpigmentation of the right/treated breast noted without desquamation. Patient reports using Radiaplex as directed. Patient denies headache, dizziness, nausea, or vomiting. Reported all findings to Dr. Basilio Cairo.

## 2012-06-14 ENCOUNTER — Ambulatory Visit: Payer: BC Managed Care – PPO

## 2012-06-15 ENCOUNTER — Encounter: Payer: Self-pay | Admitting: Radiation Oncology

## 2012-06-15 ENCOUNTER — Ambulatory Visit
Admission: RE | Admit: 2012-06-15 | Discharge: 2012-06-15 | Disposition: A | Discharge status: Home / Self Care | Payer: BC Managed Care – PPO | Source: Ambulatory Visit | Attending: Radiation Oncology | Admitting: Radiation Oncology

## 2012-06-16 ENCOUNTER — Ambulatory Visit
Admission: RE | Admit: 2012-06-16 | Discharge: 2012-06-16 | Disposition: A | Discharge status: Home / Self Care | Payer: BC Managed Care – PPO | Source: Ambulatory Visit | Attending: Radiation Oncology | Admitting: Radiation Oncology

## 2012-06-17 ENCOUNTER — Ambulatory Visit
Admission: RE | Admit: 2012-06-17 | Discharge: 2012-06-17 | Disposition: A | Discharge status: Home / Self Care | Payer: BC Managed Care – PPO | Source: Ambulatory Visit | Attending: Radiation Oncology | Admitting: Radiation Oncology

## 2012-06-20 ENCOUNTER — Ambulatory Visit
Admission: RE | Admit: 2012-06-20 | Discharge: 2012-06-20 | Disposition: A | Discharge status: Home / Self Care | Payer: BC Managed Care – PPO | Source: Ambulatory Visit | Attending: Radiation Oncology | Admitting: Radiation Oncology

## 2012-06-21 ENCOUNTER — Ambulatory Visit
Admission: RE | Admit: 2012-06-21 | Discharge: 2012-06-21 | Disposition: A | Discharge status: Home / Self Care | Payer: BC Managed Care – PPO | Source: Ambulatory Visit | Attending: Radiation Oncology | Admitting: Radiation Oncology

## 2012-06-22 ENCOUNTER — Ambulatory Visit
Admission: RE | Admit: 2012-06-22 | Discharge: 2012-06-22 | Disposition: A | Discharge status: Home / Self Care | Payer: BC Managed Care – PPO | Source: Ambulatory Visit | Attending: Radiation Oncology | Admitting: Radiation Oncology

## 2012-06-22 ENCOUNTER — Encounter: Payer: Self-pay | Admitting: Radiation Oncology

## 2012-06-22 VITALS — BP 131/68 | HR 101 | Resp 18 | Wt 237.6 lb

## 2012-06-22 DIAGNOSIS — C50419 Malignant neoplasm of upper-outer quadrant of unspecified female breast: Secondary | ICD-10-CM

## 2012-06-22 NOTE — Progress Notes (Signed)
   Weekly Management Note Current Dose:  50Gy  Projected Dose:  60 Gy   Narrative:  The patient presents for routine under treatment assessment.  CBCT/MVCT images/Port film x-rays were reviewed.  The chart was checked. She is doing relatively well. Has some congestion which seems consistent with a virus or cold. She reports hyperpigmentation over her breast. Using radiaplex.  Physical Findings:  weight is 237 lb 9.6 oz (107.775 kg). Her blood pressure is 131/68 and her pulse is 101. Her respiration is 18.  the patient's right breast is diffusely hyperpigmented. skin is intact.  Impression:  The patient is tolerating radiotherapy.  Plan:  Continue radiotherapy as planned. Boost starts tomorrow.  ________________________________   Lonie Peak, M.D.

## 2012-06-22 NOTE — Progress Notes (Signed)
Patient presents to the clinic today unaccompanied for PUT with Dr. Basilio Cairo. Patient is alert and oriented to person, place, and time. No distress noted. Steady gait noted. Pleasant affect noted. Patient denies pain at this time. Patient reports that her incision under her right axilla related to breast surgery is sore. Patient reports hyperpigmentation without desquamation of right/treated breast. Patient reports that she continues to use Radiaplex gel as directed. Patient reports that her sinuses are burning and drainage. Patient has no other complaints. Reported all findings to Dr. Basilio Cairo.

## 2012-06-23 ENCOUNTER — Ambulatory Visit
Admission: RE | Admit: 2012-06-23 | Discharge: 2012-06-23 | Disposition: A | Discharge status: Home / Self Care | Payer: BC Managed Care – PPO | Source: Ambulatory Visit | Attending: Radiation Oncology | Admitting: Radiation Oncology

## 2012-06-23 NOTE — Progress Notes (Signed)
VERIFICATION SIMULATION NOTE  The patient was laid in the correct position on the treatment table for simulation verification. Portal imaging was obtained and I verified the fields and MLCs for her breast boost treatment to be accurate. The patient tolerated the procedure well.  -----------------------------------------------------  Lonie Peak, MD

## 2012-06-24 ENCOUNTER — Ambulatory Visit
Admission: RE | Admit: 2012-06-24 | Discharge: 2012-06-24 | Disposition: A | Discharge status: Home / Self Care | Payer: BC Managed Care – PPO | Source: Ambulatory Visit | Attending: Radiation Oncology | Admitting: Radiation Oncology

## 2012-06-27 ENCOUNTER — Ambulatory Visit
Admission: RE | Admit: 2012-06-27 | Discharge: 2012-06-27 | Disposition: A | Discharge status: Home / Self Care | Payer: BC Managed Care – PPO | Source: Ambulatory Visit | Attending: Radiation Oncology | Admitting: Radiation Oncology

## 2012-06-27 ENCOUNTER — Encounter: Payer: Self-pay | Admitting: Radiation Oncology

## 2012-06-27 VITALS — BP 120/76 | HR 109 | Temp 98.5°F | Wt 230.0 lb

## 2012-06-27 DIAGNOSIS — C50419 Malignant neoplasm of upper-outer quadrant of unspecified female breast: Secondary | ICD-10-CM

## 2012-06-27 NOTE — Progress Notes (Signed)
Desquamation in the upper lateral side of her right breast and there is a small area in the right inframmary fold.  [name] denies any pain at the moment.   She will complete in 2 days.  Seen by Dr. Basilio Cairo.

## 2012-06-27 NOTE — Progress Notes (Signed)
   Weekly Management Note Current Dose:  56 Gy  Projected Dose:   60 Gy   Narrative:  The patient presents for routine under treatment assessment.  CBCT/MVCT images/Port film x-rays were reviewed.  The chart was checked. Doing well, does have diffuse hyperpigmentation and some small areas of moist desquamation in the right axilla and right inframammary fold   Physical Findings:  weight is 230 lb (104.327 kg). Her temperature is 98.5 F (36.9 C). Her blood pressure is 120/76 and her pulse is 109.  diffuse hyperpigmentation and some small areas of moist desquamation in the right axilla and right inframammary fold   Impression:  The patient is tolerating radiotherapy.  Plan:  Continue radiotherapy as planned. Instructed to use nonadherent pads at the areas of moist desquamation with Neosporin. Will use radiaplex elsewhere. 1 month f/u card given.  ________________________________   Lonie Peak, M.D.

## 2012-06-28 ENCOUNTER — Ambulatory Visit
Admission: RE | Admit: 2012-06-28 | Discharge: 2012-06-28 | Disposition: A | Discharge status: Home / Self Care | Payer: BC Managed Care – PPO | Source: Ambulatory Visit | Attending: Radiation Oncology | Admitting: Radiation Oncology

## 2012-06-28 ENCOUNTER — Ambulatory Visit: Payer: BC Managed Care – PPO

## 2012-06-29 ENCOUNTER — Encounter: Payer: Self-pay | Admitting: Radiation Oncology

## 2012-06-29 ENCOUNTER — Ambulatory Visit: Payer: BC Managed Care – PPO

## 2012-06-29 ENCOUNTER — Ambulatory Visit
Admission: RE | Admit: 2012-06-29 | Discharge: 2012-06-29 | Disposition: A | Discharge status: Home / Self Care | Payer: BC Managed Care – PPO | Source: Ambulatory Visit | Attending: Radiation Oncology | Admitting: Radiation Oncology

## 2012-06-30 ENCOUNTER — Ambulatory Visit: Payer: BC Managed Care – PPO

## 2012-07-01 NOTE — Progress Notes (Deleted)
°  Radiation Oncology         [phone]) [phone] ________________________________  Name: [name] MRN: [id number]  Date: 06/29/2012  DOB: 06-Mar-1951  End of Treatment Note  Diagnosis:   Right upper outer quadrant, DCIS High-grade, ER/PR positive, no necrosis   Indication for treatment: Curative     Radiation treatment dates:  05/16/2012-06/29/2012  Site/dose:   #1 right breast/ 50 gray in 25 fractions #2 right breast boost /10 gray in 5 fractions  Beams/energy:   #1 opposed tangents/6 MV photons #2 3 field / 6 MV photons  Narrative: The patient tolerated radiation treatment relatively well.  She developed diffuse hyperpigmentation with small areas of moist desquamation. She is instructed to use nonadherent at the areas of moist desquamation with Neosporin. She was instructed to continue radiaplex elsewhere.  Plan: The patient has completed radiation treatment. The patient will return to radiation oncology clinic for routine followup in one month. I advised them to call or return sooner if they have any questions or concerns related to their recovery or treatment.  -----------------------------------  Lonie Peak, MD

## 2012-07-05 ENCOUNTER — Ambulatory Visit: Payer: BC Managed Care – PPO | Admitting: Oncology

## 2012-07-07 ENCOUNTER — Ambulatory Visit (HOSPITAL_BASED_OUTPATIENT_CLINIC_OR_DEPARTMENT_OTHER): Payer: BC Managed Care – PPO | Admitting: Oncology

## 2012-07-07 ENCOUNTER — Telehealth: Payer: Self-pay | Admitting: *Deleted

## 2012-07-07 VITALS — BP 125/72 | HR 118 | Temp 98.6°F | Resp 20 | Ht 62.5 in | Wt 237.3 lb

## 2012-07-07 DIAGNOSIS — D0590 Unspecified type of carcinoma in situ of unspecified breast: Secondary | ICD-10-CM

## 2012-07-07 DIAGNOSIS — E119 Type 2 diabetes mellitus without complications: Secondary | ICD-10-CM

## 2012-07-07 DIAGNOSIS — D0510 Intraductal carcinoma in situ of unspecified breast: Secondary | ICD-10-CM

## 2012-07-07 DIAGNOSIS — Z170 Estrogen receptor positive status [ER+]: Secondary | ICD-10-CM

## 2012-07-07 MED ORDER — ANASTROZOLE 1 MG PO TABS: 1.0000 mg | ORAL_TABLET | Freq: Every day | ORAL | Status: DC

## 2012-07-07 NOTE — Progress Notes (Signed)
ID: [name]   DOB: 03/20/1951  MR#: [id number]  CSN#:[id number]  PCP: Gwynneth Aliment, MD GYN: SUManus Rudd MD OTHER MD: Lonie Peak  HISTORY OF PRESENT ILLNESS: [name] had routine screening mammography at the breast center 01/21/2012. This showed a possible mass in the right breast. Additional views were obtained may 06/17/2012 and these confirmed an irregular mass in the upper outer right breast, which was palpable. Ultrasound showed a solid mass measuring 1.2 cm with a dilated duct extending medially from the mass. The right axilla was unremarkable by ultrasound.  Biopsy of the right breast mass on 02/11/2012 showed ([id number]) ductal carcinoma in situ, involving a papilloma. Estrogen receptor was 100% positive. Progesterone receptor was 92% positive. Breast MRI was obtained 02/19/2012. This confirmed an oval nonenhancing mass with lobulated margins in the upper outer quadrant of the right breast measuring 1.4 cm, with no additional suspicious masses and no axillary or internal mammary adenopathy.  Accordingly on 03/16/2012 the patient underwent right lumpectomy with sentinel lymph node sampling. This showed ([id number]) ductal carcinoma in situ, high-grade, measuring 1.8 cm, with negative but close margins, the closest being 1 mm. The sentinel lymph node was negative. The patient tolerated the surgery well. Her subsequent history is as detailed below.  INTERVAL HISTORY: [name] returns today for followup of her breast cancer. The interval history is generally unremarkable. She completed radiation treatments earlier this month.  REVIEW OF SYSTEMS: She did well with radiation, with some dry desquamation only. She continued to work right through the treatments. She complains of some fatigue, some knee pain, sinus problems, shortness of breath when walking up stairs, and of course concerns regarding her diabetes. Otherwise a detailed review of systems is noncontributory.  PAST  MEDICAL HISTORY: Past Medical History  Diagnosis Date  . Bradycardia   . Iron deficiency anemia   . Hiatal hernia     large  . Diabetes mellitus     type 2  . Morbid obesity   . GERD (gastroesophageal reflux disease)   . Shortness of breath     with exertion  . Sinus drainage   . Breast cancer 03/16/12    right lumpectomy=high grade ductal ca in situ,ER/PR=positive  . Cancer     right breast ductal carcinoma in situ  . Hypertension     Echocardiogram 03/2011: EF 55-60%, grade 1 diastolic dysfunction, mild MAC, mild LAE  . Blood transfusion 03/14/12    PAST SURGICAL HISTORY: Past Surgical History  Procedure Date  . Breast lumpectomy 04/1997  . Biospy right breast 02/01/12    Right Breast Needle Core Biopsy: ductal Carcinoma In situ with Papillary Features, ER/PR Positive  . Lumpectomy right breast 03/16/12    Ductal carcinoma In situ: clear margins: 0/1 Node Negative   she had a left lumpectomy in 1998, benign.  FAMILY HISTORY Family History  Problem Relation Age of Onset  . Coronary artery disease Father     heart attack  . Cancer Sister     breast  . Lymphoma Sister     Deceased at age 90   the patient's father died from heart disease at the age of 83. The patient's mother is alive, as of July of 20135, being 63 years old. The patient has 2 brothers and 4 sisters. One sister was diagnosed with lymphoma at age 81, and died from that disease at age 48. Her daughter, the patient's niece, had pancreatic cancer diagnosed at age 43. Another niece was diagnosed with  breast cancer at age 39, and a second sister of the patient was diagnosed with breast cancer at age 60, and survives. Another sister has been diagnosed with renal cell carcinoma, terminal stage.  GYNECOLOGIC HISTORY: Menarche age 81, last menstrual period age 32. The patient did not take hormone replacement. She is GX P1, first live birth age 36.  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 Her husband [name] is a retired used Community education officer and currently drives a school bus. Son [name] lives in [place] and is an Producer, television/film/video. The patient has 2 biological grandchildren and 6 step grandchildren. She attends [employer] locally   ADVANCED DIRECTIVES: not in place  HEALTH MAINTENANCE: History  Substance Use Topics  . Smoking status: Never Smoker   . Smokeless tobacco: Never Used  . Alcohol Use: No     Colonoscopy: July 2012/[facility]  PAP:2012/ [facility]  Bone density:July 2012  Lipid panel:  No Known Allergies  Current Outpatient Prescriptions  Medication Sig Dispense Refill  . cetirizine (ZYRTEC) 10 MG tablet Take 10 mg by mouth daily as needed. For allergies.      . hyaluronate sodium (RADIAPLEXRX) GEL Apply 1 application topically 2 (two) times daily. Apply to right breast twice daily after treatment and at bedtime.      . iron polysaccharides (NIFEREX) 150 MG capsule Take 150 mg by mouth 2 (two) times daily.        . naproxen sodium (ANAPROX) 220 MG tablet Take 220 mg by mouth as needed.      . non-metallic deodorant Thornton Papas) MISC Apply 1 application topically daily.      . Olmesartan-Amlodipine-HCTZ (TRIBENZOR) 40-10-25 MG TABS Take 1 tablet by mouth daily.       [name] omeprazole (PRILOSEC) 20 MG capsule Take 20 mg by mouth daily as needed. Alternates with prevacid depending upon cost For reflux      . pioglitazone-metformin (ACTOPLUS MET) 15-850 MG per tablet Take 1 tablet by mouth 2 (two) times daily with a meal.         OBJECTIVE: Middle-aged [place] American woman who appears well Filed Vitals:   07/07/12 0945  BP: 125/72  Pulse: 118  Temp: 98.6 F (37 C)  Resp: 20     Body mass index is 42.71 kg/(m^2).    ECOG FS:0  Sclerae unicteric Oropharynx clear No cervical or supraclavicular adenopathy Lungs no rales or rhonchi Heart regular rate and rhythm Abd benign MSK no focal spinal tenderness, no peripheral  edema Neuro: nonfocal Breasts: The right breast is status post lumpectomy and a radiation. There is significant hyper pigmentation. There is some dry desquamation in the axilla and inframammary fold. The right axilla is otherwise clear. The left breast is unremarkable.  LAB RESULTS: Lab Results  Component Value Date   WBC 6.4 04/08/2012   NEUTROABS 4.2 03/29/2012   HGB 7.7* 04/08/2012   HCT 26.5* 04/08/2012   MCV 62.1* 04/08/2012   PLT 323 04/08/2012      Chemistry      Component Value Date/Time   NA 139 04/08/2012 1529   K 4.1 04/08/2012 1529   CL 104 04/08/2012 1529   CO2 27 04/08/2012 1529   BUN 19 04/08/2012 1529   CREATININE 1.02 04/08/2012 1529      Component Value Date/Time   CALCIUM 9.3 04/08/2012 1529   ALKPHOS 69 03/29/2012 1618   AST 17 03/29/2012 1618   ALT 16 03/29/2012 1618  BILITOT 0.2* 03/29/2012 1618       No results found for this basename: LABCA2    No components found with this basename: [id number]    No results found for this basename: INR:1;PROTIME:1 in the last 168 hours  Urinalysis No results found for this basename: colorurine,  appearanceur,  labspec,  phurine,  glucoseu,  hgbur,  bilirubinur,  ketonesur,  proteinur,  urobilinogen,  nitrite,  leukocytesur    STUDIES: Dg Chest 2 View  03/11/2012  *RADIOLOGY REPORT*  Clinical Data: Abnormal prior chest radiograph.  Lumpectomy with right sentinel node biopsy.  Preoperative examination.  CHEST - 2 VIEW  Comparison: 04/08/2011.  Findings: Elevation of the right hemidiaphragm persists. Eventration of the left hemidiaphragm.  Cardiopericardial silhouette is within normal limits on today's exam.  There is no airspace disease.  No effusion.  High-riding right humeral head compatible with chronic rotator cuff tear.  No plain film evidence of adenopathy.  IMPRESSION: No active cardiopulmonary disease.  Original Report Authenticated By: Andreas Newport, M.D.   Nm Sentinel Node Inj-no Rpt (breast)  03/16/2012  CLINICAL DATA:  Right upper outer quadrant DCIS   Sulfur colloid was injected intradermally by the nuclear medicine  technologist for breast cancer sentinel node localization.     Mm Breast Surgical Specimen  03/16/2012  *RADIOLOGY REPORT*  Clinical Data:  Right-sided breast cancer  NEEDLE LOCALIZATION WITH MAMMOGRAPHIC GUIDANCE AND SPECIMEN RADIOGRAPH  Comparison:  Previous exams  Patient presents for needle localization prior to right lumpectomy. I met with the patient and we discussed the procedure of needle localization including benefits and alternatives. We discussed the high likelihood of a successful procedure. We discussed the risks of the procedure, including infection, bleeding, tissue injury, and further surgery. Informed, written consent was given.  Using mammographic guidance, sterile technique, 2% lidocaine and a 5 cm modified Kopans needle, the mass and clip are localized using a a lateral approach.  The films are marked for Dr. Corliss Skains.  Specimen radiograph was performed [facility], and confirms the mass, wire and clip are present in the tissue sample.  The specimen is marked for pathology.  IMPRESSION: Needle localization right breast.  No apparent complications.  Original Report Authenticated By: Hiram Gash, M.D.   Mm Breast Wire Localization Right  03/16/2012  *RADIOLOGY REPORT*  Clinical Data:  Right-sided breast cancer  NEEDLE LOCALIZATION WITH MAMMOGRAPHIC GUIDANCE AND SPECIMEN RADIOGRAPH  Comparison:  Previous exams  Patient presents for needle localization prior to right lumpectomy. I met with the patient and we discussed the procedure of needle localization including benefits and alternatives. We discussed the high likelihood of a successful procedure. We discussed the risks of the procedure, including infection, bleeding, tissue injury, and further surgery. Informed, written consent was given.  Using mammographic guidance, sterile technique, 2% lidocaine and a 5 cm modified Kopans needle,  the mass and clip are localized using a a lateral approach.  The films are marked for Dr. Corliss Skains.  Specimen radiograph was performed at [facility], and confirms the mass, wire and clip are present in the tissue sample.  The specimen is marked for pathology.  IMPRESSION: Needle localization right breast.  No apparent complications.  Original Report Authenticated By: Hiram Gash, M.D.    ASSESSMENT: 61 y.o. [place] woman s/p Right lumpectomy and sentinel lymph node sampling 03/16/2012 for a 1.8 cm ductal carcinoma in situ, high-grade, strongly estrogen and progesterone receptor positive.  (1) additional resection for improved margins July 2013  (2) completed adjuvant  irradiation 06/29/2012  (3) started anastrozole October 2013  PLAN: Today we discussed the possible side effects, complications, toxicities as well as the benefits of antiestrogen. She understands her risk of local recurrence is very low to begin with, so they will be only a marginal benefit in further lowering that risk. On the other hand she has approximately a 1% per year risk of developing another breast cancer, and that risk would be cut in half by taking antiestrogen for 5 years. She is interested in this strategy, and after much discussion we decided we would try anastrozole. I went ahead and wrote her the prescription and she will start that medication now. She will see [place] again in 3 months. If she is tolerating that well, we will see her on a once a year basis for the next 5 years. She knows to call for any problems that may develop before the next visit.  MAGRINAT,GUSTAV C    07/07/2012

## 2012-07-07 NOTE — Telephone Encounter (Signed)
GAVE PATIENT APPOINTMENT FOR 10-06-2012 STARTING AT 9:15AM

## 2012-07-11 NOTE — Progress Notes (Signed)
Radiation Oncology [phone]) [phone]  ________________________________  Name: [name] MRN: [id number]  Date: 06/29/2012 DOB: [birth year]   End of Treatment Note   Diagnosis: Right upper outer quadrant, DCIS High-grade, ER/PR positive, no necrosis   Indication for treatment: Curative   Radiation treatment dates: 05/16/2012-06/29/2012   Site/dose:  #1 right breast/ 50 gray in 25 fractions  #2 right breast boost /10 gray in 5 fractions   Beams/energy:  #1 opposed tangents/6 MV photons  #2 3 field / 6 MV photons   Narrative: The patient tolerated radiation treatment relatively well. She developed diffuse hyperpigmentation with small areas of moist desquamation. She is instructed to use nonadherent at the areas of moist desquamation with Neosporin. She was instructed to continue radiaplex elsewhere.   Plan: The patient has completed radiation treatment. The patient will return to radiation oncology clinic for routine followup in one month. I advised them to call or return sooner if they have any questions or concerns related to their recovery or treatment.   -----------------------------------  Lonie Peak, MD

## 2012-07-11 NOTE — Progress Notes (Signed)
Photon Boost Complex Nutritional therapist Note  Diagnosis: Breast DCIS  The patient's CT images from her CT simulation were reviewed to plan her boost treatment to her right breast  lumpectomy cavity.  The boost to the lumpectomy cavity will be delivered with 3 photon fields using MLCs for custom blocks with 6 MV photon energy. 10 Gy in 5 fractions will be delivered.

## 2012-07-21 ENCOUNTER — Ambulatory Visit: Payer: BC Managed Care – PPO | Admitting: Oncology

## 2012-07-29 ENCOUNTER — Ambulatory Visit: Payer: BC Managed Care – PPO | Admitting: Radiation Oncology

## 2012-08-05 ENCOUNTER — Ambulatory Visit
Admission: RE | Admit: 2012-08-05 | Discharge: 2012-08-05 | Disposition: A | Discharge status: Home / Self Care | Payer: BC Managed Care – PPO | Source: Ambulatory Visit | Attending: Radiation Oncology | Admitting: Radiation Oncology

## 2012-08-05 ENCOUNTER — Encounter: Payer: Self-pay | Admitting: Radiation Oncology

## 2012-08-05 VITALS — BP 143/60 | HR 110 | Temp 98.3°F | Wt 244.9 lb

## 2012-08-05 DIAGNOSIS — C50419 Malignant neoplasm of upper-outer quadrant of unspecified female breast: Secondary | ICD-10-CM

## 2012-08-05 HISTORY — DX: Long term (current) use of aromatase inhibitors: Z79.811

## 2012-08-05 HISTORY — DX: Personal history of irradiation: Z92.3

## 2012-08-05 NOTE — Progress Notes (Signed)
Radiation Oncology         [phone] ________________________________  Name: [name] MRN: [id number]  Date: 08/05/2012  DOB: [birth year]  Follow-Up Visit Note  CC: Gwynneth Aliment, MD  Dorothyann Peng, MD  Diagnosis:  Right upper outer quadrant DCIS, high-grade, ER/PR positive  Interval Since Last Radiation:  She completed 60 gray in 30 fractions on 06-29-12  Narrative:  The patient returns today for routine follow-up.      She is doing relatively well overall but she does have fatigue and shortness of breath. Oxygen saturation is 100% after ambulating today in the clinic. Looking back, her hemoglobin was 6.9 in June. She reports that she received blood transfusions during the summer for this. Most recent hemoglobin in July was 7.7.  A year earlier, she also had anemia and this was worked up with a colonoscopy which was unremarkable. Dr. Randa Evens of gastroenterology felt that it may be caused by a hiatal hernia. She is on oral iron supplements.        No further followup has been conducted for her anemia.  She is taking Arimidex. Her breast is still healing. She is still applying radiaplex. The pigmentation is different than before radiotherapy.        ALLERGIES:   has no known allergies.  Meds: Current Outpatient Prescriptions  Medication Sig Dispense Refill  . anastrozole (ARIMIDEX) 1 MG tablet Take 1 tablet (1 mg total) by mouth daily.  90 tablet  12  . benzonatate (TESSALON) 100 MG capsule       . calcium-vitamin D (OSCAL WITH D) 500-200 MG-UNIT per tablet Take 1 tablet by mouth 2 (two) times daily.      . cetirizine (ZYRTEC) 10 MG tablet Take 10 mg by mouth daily as needed. For allergies.      . hyaluronate sodium (RADIAPLEXRX) GEL Apply 1 application topically 2 (two) times daily. Apply to right breast twice daily after treatment and at bedtime.      . iron polysaccharides (NIFEREX) 150 MG capsule Take 150 mg by mouth 2 (two) times daily.        [name] JANUMET XR 50-500 MG  TB24       . Olmesartan-Amlodipine-HCTZ (TRIBENZOR) 40-10-25 MG TABS Take 1 tablet by mouth daily.       [name] omeprazole (PRILOSEC) 20 MG capsule Take 20 mg by mouth daily as needed. Alternates with prevacid depending upon cost For reflux        Physical Findings: The patient is in no acute distress. Patient is alert and oriented.  weight is 244 lb 14.4 oz (111.086 kg). Her temperature is 98.3 F (36.8 C). Her blood pressure is 143/60 and her pulse is 110. [name]  No significant changes. Right breast demonstrates residual patchy hyperpigmentation. There is a little bit of dryness in the axilla.  Lab Findings: Lab Results  Component Value Date   WBC 6.4 04/08/2012   HGB 7.7* 04/08/2012   HCT 26.5* 04/08/2012   MCV 62.1* 04/08/2012   PLT 323 04/08/2012      Radiographic Findings: No results found.  Impression:  The patient is recovering from the effects of radiation.   Plan:  I will contact Dr. Darnelle Catalan to see if he can follow-up on the patients' time if anemia - it appears this may need further workup. She has not had lab work since July.  Of note, per documentation, colonoscopy by Dr. Randa Evens in July 2012 was negative. He felt her anemia may be due to  a hiatal hernia. However, I'm curious if further workup is warranted. I will see if Dr. Darnelle Catalan or one of his colleagues in.in medical oncology/hematology  would find it helpful for the patient to be evaluated.  Otherwise, she will continue Arimidex. I recommended vitamin E capsules to be punctured and rubbed over her breast at least once a day for further healing.  I encouraged her to continue with yearly mammography and followup with medical oncology. I will see her back on an as-needed basis. I have encouraged her to call if she has any issues or concerns in the future. I wished her the very best. _____________________________________   Lonie Peak, MD

## 2012-08-05 NOTE — Progress Notes (Signed)
FU with Dr. Basilio Cairo for assessment status post radiation therapy of the right breast.  Denies any pain but c/o fatigue and SOB.  Revealed that she was received blood for anemia, Hgb 6.9 on 03/10/12 and last Hgb. 7.7 in July.  Taking Neferex presently.  O2 sat 100% after ambulating to the exam room.   Does not like the way her nipple area looks since it is dark and her hair follicles are lighter in this area whic gives it a spotty apperrance

## 2012-08-08 ENCOUNTER — Other Ambulatory Visit: Payer: Self-pay | Admitting: Physician Assistant

## 2012-08-08 ENCOUNTER — Telehealth: Payer: Self-pay | Admitting: *Deleted

## 2012-08-08 DIAGNOSIS — D509 Iron deficiency anemia, unspecified: Secondary | ICD-10-CM

## 2012-08-08 DIAGNOSIS — C50419 Malignant neoplasm of upper-outer quadrant of unspecified female breast: Secondary | ICD-10-CM

## 2012-08-08 NOTE — Telephone Encounter (Signed)
Left voice message to inform the patient of the new date and time of the nov.2013 appointments

## 2012-08-11 ENCOUNTER — Other Ambulatory Visit: Payer: Self-pay | Admitting: *Deleted

## 2012-08-11 ENCOUNTER — Other Ambulatory Visit (HOSPITAL_BASED_OUTPATIENT_CLINIC_OR_DEPARTMENT_OTHER): Payer: BC Managed Care – PPO | Admitting: Lab

## 2012-08-11 DIAGNOSIS — D509 Iron deficiency anemia, unspecified: Secondary | ICD-10-CM

## 2012-08-11 DIAGNOSIS — C50419 Malignant neoplasm of upper-outer quadrant of unspecified female breast: Secondary | ICD-10-CM

## 2012-08-11 LAB — CBC & DIFF AND RETIC
Basophils Absolute: 0 10*3/uL (ref 0.0–0.1)
EOS%: 4.4 % (ref 0.0–7.0)
Eosinophils Absolute: 0.2 10*3/uL (ref 0.0–0.5)
HGB: 5.4 g/dL — CL (ref 11.6–15.9)
LYMPH%: 20.2 % (ref 14.0–49.7)
MCH: 15.1 pg — ABNORMAL LOW (ref 25.1–34.0)
MCV: 57.3 fL — ABNORMAL LOW (ref 79.5–101.0)
MONO%: 8.1 % (ref 0.0–14.0)
NEUT#: 3.6 10*3/uL (ref 1.5–6.5)
NEUT%: 66.7 % (ref 38.4–76.8)
Platelets: 327 10*3/uL (ref 145–400)
RDW: 22.1 % — ABNORMAL HIGH (ref 11.2–14.5)

## 2012-08-11 LAB — COMPREHENSIVE METABOLIC PANEL (CC13)
AST: 12 U/L (ref 5–34)
Albumin: 3.3 g/dL — ABNORMAL LOW (ref 3.5–5.0)
BUN: 31 mg/dL — ABNORMAL HIGH (ref 7.0–26.0)
CO2: 28 mEq/L (ref 22–29)
Calcium: 9.7 mg/dL (ref 8.4–10.4)
Chloride: 107 mEq/L (ref 98–107)
Creatinine: 1.6 mg/dL — ABNORMAL HIGH (ref 0.6–1.1)
Glucose: 112 mg/dl — ABNORMAL HIGH (ref 70–99)
Potassium: 4.1 mEq/L (ref 3.5–5.1)

## 2012-08-12 ENCOUNTER — Other Ambulatory Visit: Payer: Self-pay | Admitting: Oncology

## 2012-08-12 ENCOUNTER — Telehealth: Payer: Self-pay | Admitting: *Deleted

## 2012-08-12 ENCOUNTER — Ambulatory Visit (HOSPITAL_BASED_OUTPATIENT_CLINIC_OR_DEPARTMENT_OTHER): Payer: BC Managed Care – PPO

## 2012-08-12 VITALS — BP 110/60 | HR 91 | Temp 98.2°F

## 2012-08-12 DIAGNOSIS — D509 Iron deficiency anemia, unspecified: Secondary | ICD-10-CM

## 2012-08-12 DIAGNOSIS — D649 Anemia, unspecified: Secondary | ICD-10-CM

## 2012-08-12 DIAGNOSIS — C50919 Malignant neoplasm of unspecified site of unspecified female breast: Secondary | ICD-10-CM

## 2012-08-12 DIAGNOSIS — C50419 Malignant neoplasm of upper-outer quadrant of unspecified female breast: Secondary | ICD-10-CM

## 2012-08-12 DIAGNOSIS — D0510 Intraductal carcinoma in situ of unspecified breast: Secondary | ICD-10-CM

## 2012-08-12 LAB — FOLATE RBC: RBC Folate: 1147 ng/mL (ref >=366)

## 2012-08-12 MED ORDER — SODIUM CHLORIDE 0.9 % IV SOLN
1020.0000 mg | Freq: Once | INTRAVENOUS | Status: AC
  Administered 2012-08-12: 1020 mg via INTRAVENOUS
  Filled 2012-08-12: qty 34

## 2012-08-12 MED ORDER — SODIUM CHLORIDE 0.9 % IV SOLN
Freq: Once | INTRAVENOUS | Status: AC
  Administered 2012-08-12: 17:00:00 via INTRAVENOUS

## 2012-08-12 NOTE — Patient Instructions (Signed)
Ferumoxytol injection What is this medicine? FERUMOXYTOL is an iron complex. Iron is used to make healthy red blood cells, which carry oxygen and nutrients throughout the body. This medicine is used to treat iron deficiency anemia in people with chronic kidney disease. This medicine may be used for other purposes; ask your health care provider or pharmacist if you have questions. What should I tell my health care provider before I take this medicine? They need to know if you have any of these conditions: -anemia not caused by low iron levels -high levels of iron in the blood -magnetic resonance imaging (MRI) test scheduled -an unusual or allergic reaction to iron, other medicines, foods, dyes, or preservatives -pregnant or trying to get pregnant -breast-feeding How should I use this medicine? This medicine is for infusion into a vein. It is given by a health care professional in a hospital or clinic setting. Talk to your pediatrician regarding the use of this medicine in children. Special care may be needed. Overdosage: If you think you've taken too much of this medicine contact a poison control center or emergency room at once. Overdosage: If you think you have taken too much of this medicine contact a poison control center or emergency room at once. NOTE: This medicine is only for you. Do not share this medicine with others. What if I miss a dose? It is important not to miss your dose. Call your doctor or health care professional if you are unable to keep an appointment. What may interact with this medicine? This medicine may interact with the following medications: -other iron products This list may not describe all possible interactions. Give your health care provider a list of all the medicines, herbs, non-prescription drugs, or dietary supplements you use. Also tell them if you smoke, drink alcohol, or use illegal drugs. Some items may interact with your medicine. What should I watch  for while using this medicine? Visit your doctor or healthcare professional regularly. Tell your doctor or healthcare professional if your symptoms do not start to get better or if they get worse. You may need blood work done while you are taking this medicine. You may need to follow a special diet. Talk to your doctor. Foods that contain iron include: whole grains/cereals, dried fruits, beans, or peas, leafy green vegetables, and organ meats (liver, kidney). What side effects may I notice from receiving this medicine? Side effects that you should report to your doctor or health care professional as soon as possible: -allergic reactions like skin rash, itching or hives, swelling of the face, lips, or tongue -breathing problems -changes in blood pressure -feeling faint or lightheaded, falls -fever or chills -flushing, sweating, or hot feelings -swelling of the ankles or feet Side effects that usually do not require medical attention (Report these to your doctor or health care professional if they continue or are bothersome.): -diarrhea -headache -nausea, vomiting -stomach pain This list may not describe all possible side effects. Call your doctor for medical advice about side effects. You may report side effects to FDA at 1-800-FDA-1088. Where should I keep my medicine? This drug is given in a hospital or clinic and will not be stored at home. NOTE: This sheet is a summary. It may not cover all possible information. If you have questions about this medicine, talk to your doctor, pharmacist, or health care provider.  2012, [employer]vier/[employer] Standard. (06/06/2008 9:48:25 PM) 

## 2012-08-12 NOTE — Telephone Encounter (Signed)
Add on lab only on 08-29-2012 at 10:00am

## 2012-08-18 ENCOUNTER — Other Ambulatory Visit (HOSPITAL_BASED_OUTPATIENT_CLINIC_OR_DEPARTMENT_OTHER): Payer: BC Managed Care – PPO

## 2012-08-18 ENCOUNTER — Ambulatory Visit (HOSPITAL_BASED_OUTPATIENT_CLINIC_OR_DEPARTMENT_OTHER): Payer: BC Managed Care – PPO | Admitting: Physician Assistant

## 2012-08-18 ENCOUNTER — Telehealth: Payer: Self-pay | Admitting: *Deleted

## 2012-08-18 ENCOUNTER — Encounter: Payer: Self-pay | Admitting: Physician Assistant

## 2012-08-18 VITALS — BP 124/66 | HR 104 | Temp 97.9°F | Resp 20 | Ht 62.5 in | Wt 239.0 lb

## 2012-08-18 DIAGNOSIS — D509 Iron deficiency anemia, unspecified: Secondary | ICD-10-CM

## 2012-08-18 DIAGNOSIS — C50419 Malignant neoplasm of upper-outer quadrant of unspecified female breast: Secondary | ICD-10-CM

## 2012-08-18 DIAGNOSIS — Z170 Estrogen receptor positive status [ER+]: Secondary | ICD-10-CM

## 2012-08-18 DIAGNOSIS — C50919 Malignant neoplasm of unspecified site of unspecified female breast: Secondary | ICD-10-CM

## 2012-08-18 DIAGNOSIS — D0590 Unspecified type of carcinoma in situ of unspecified breast: Secondary | ICD-10-CM

## 2012-08-18 LAB — CBC WITH DIFFERENTIAL/PLATELET
Basophils Absolute: 0 10*3/uL (ref 0.0–0.1)
EOS%: 3 % (ref 0.0–7.0)
HCT: 21.9 % — ABNORMAL LOW (ref 34.8–46.6)
HGB: 6.6 g/dL — CL (ref 11.6–15.9)
MCH: 18.6 pg — ABNORMAL LOW (ref 25.1–34.0)
MONO#: 0.5 10*3/uL (ref 0.1–0.9)
NEUT#: 4.6 10*3/uL (ref 1.5–6.5)
RDW: 23.4 % — ABNORMAL HIGH (ref 11.2–14.5)
WBC: 6.5 10*3/uL (ref 3.9–10.3)
lymph#: 1.1 10*3/uL (ref 0.9–3.3)

## 2012-08-18 NOTE — Patient Instructions (Signed)
Continue anastrazole, 1 mg daily  Return 12/2 for repeat labs.  Return in January for labs, one week before seeing Zollie Scale for follow up.

## 2012-08-18 NOTE — Telephone Encounter (Signed)
Return to lab today; labs only on 09/29/12

## 2012-08-18 NOTE — Progress Notes (Signed)
ID: [name]   DOB: 07-14-1951  MR#: [id number]  WUJ#:[id number]  PCP: Gwynneth Aliment, MD GYN: SUManus Rudd MD OTHER MD: Lonie Peak  HISTORY OF PRESENT ILLNESS: [name] had routine screening mammography at the breast center 01/21/2012. This showed a possible mass in the right breast. Additional views were obtained may 06/17/2012 and these confirmed an irregular mass in the upper outer right breast, which was palpable. Ultrasound showed a solid mass measuring 1.2 cm with a dilated duct extending medially from the mass. The right axilla was unremarkable by ultrasound.  Biopsy of the right breast mass on 02/11/2012 showed ([id number]) ductal carcinoma in situ, involving a papilloma. Estrogen receptor was 100% positive. Progesterone receptor was 92% positive. Breast MRI was obtained 02/19/2012. This confirmed an oval nonenhancing mass with lobulated margins in the upper outer quadrant of the right breast measuring 1.4 cm, with no additional suspicious masses and no axillary or internal mammary adenopathy.  Accordingly on 03/16/2012 the patient underwent right lumpectomy with sentinel lymph node sampling. This showed ([id number]) ductal carcinoma in situ, high-grade, measuring 1.8 cm, with negative but close margins, the closest being 1 mm. The sentinel lymph node was negative. The patient tolerated the surgery well. Her subsequent history is as detailed below.  INTERVAL HISTORY: [name] returns today for followup of her breast cancer.  She completed her radiation therapy in October, after which she started on anastrozole at 1 mg daily. She's tolerating the anastrozole well, with no hot flashes, no increased joint pain, and no vaginal dryness.  Interval history is remarkable for [name] having had labs last week revealing a hemoglobin of 5.4. She was found to be extremely iron deficient with a ferritin of 2, and subsequently underwent IV iron infusion on 08/12/2012. Today's labs are pending. Of  note she denies any abnormal bleeding, and is status post screening colonoscopy and endoscopy one year ago per her report.  REVIEW OF SYSTEMS: [name] has had no recent illnesses and denies fevers, chills, or night sweats. She's had no rashes or skin changes. Her energy level is actually pretty good, although she has some mild shortness of breath with exertion. She continues to work full-time at the [employer]. She's had no cough, phlegm production, chest pain, or palpitations. She denies any abnormal headaches. She has some chronic knee pain and some pain in the left hip she attributes to arthritis. She's had no peripheral swelling.  A detailed review of systems is otherwise noncontributory.  PAST MEDICAL HISTORY: Past Medical History  Diagnosis Date  . Bradycardia   . Iron deficiency anemia   . Hiatal hernia     large  . Diabetes mellitus     type 2  . Morbid obesity   . GERD (gastroesophageal reflux disease)   . Shortness of breath     with exertion  . Sinus drainage   . Breast cancer 03/16/12    right lumpectomy=high grade ductal ca in situ,ER/PR=positive  . Cancer     right breast ductal carcinoma in situ  . Hypertension     Echocardiogram 03/2011: EF 55-60%, grade 1 diastolic dysfunction, mild MAC, mild LAE  . Blood transfusion 03/14/12  . S/P radiation therapy 05/16/12 - 06/29/12    Right Breast: 50 Gray/ 25 Fractions with Boost: 10 Gray/ 5 Fractions  . Use of anastrozole (Arimidex) Started 10/13    PAST SURGICAL HISTORY: Past Surgical History  Procedure Date  . Breast lumpectomy 04/1997  . Biospy right breast 02/01/12  Right Breast Needle Core Biopsy: ductal Carcinoma In situ with Papillary Features, ER/PR Positive  . Lumpectomy right breast 03/16/12    Ductal carcinoma In situ: clear margins: 0/1 Node Negative   she had a left lumpectomy in 1998, benign.  FAMILY HISTORY Family History  Problem Relation Age of Onset  . Coronary artery disease Father     heart attack  .  Cancer Sister     breast  . Lymphoma Sister     Deceased at age 75   the patient's father died from heart disease at the age of 26. The patient's mother is alive, as of July of 20138, being 63 years old. The patient has 2 brothers and 4 sisters. One sister was diagnosed with lymphoma at age 11, and died from that disease at age 11. Her daughter, the patient's niece, had pancreatic cancer diagnosed at age 63. Another niece was diagnosed with breast cancer at age 61, and a second sister of the patient was diagnosed with breast cancer at age 40, and survives. Another sister has been diagnosed with renal cell carcinoma, terminal stage.  GYNECOLOGIC HISTORY: Menarche age 1, last menstrual period age 2. The patient did not take hormone replacement. She is GX P1, first live birth age 28.  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 Her husband [name] is a retired used Community education officer and currently drives a school bus. Son [name] lives in [place] and is an Producer, television/film/video. The patient has 2 biological grandchildren and 6 step grandchildren. She attends [employer] locally   ADVANCED DIRECTIVES: not in place  HEALTH MAINTENANCE: History  Substance Use Topics  . Smoking status: Never Smoker   . Smokeless tobacco: Never Used  . Alcohol Use: No     Colonoscopy: July 2012/[facility]  PAP:2012/ [facility]  Bone density:July 2012  Lipid panel:  No Known Allergies  Current Outpatient Prescriptions  Medication Sig Dispense Refill  . anastrozole (ARIMIDEX) 1 MG tablet Take 1 tablet (1 mg total) by mouth daily.  90 tablet  12  . benzonatate (TESSALON) 100 MG capsule       . calcium-vitamin D (OSCAL WITH D) 500-200 MG-UNIT per tablet Take 1 tablet by mouth 2 (two) times daily.      . cetirizine (ZYRTEC) 10 MG tablet Take 10 mg by mouth daily as needed. For allergies.      . hyaluronate sodium (RADIAPLEXRX) GEL Apply 1 application  topically 2 (two) times daily. Apply to right breast twice daily after treatment and at bedtime.      . iron polysaccharides (NIFEREX) 150 MG capsule Take 150 mg by mouth 2 (two) times daily.        [name] JANUMET XR 50-500 MG TB24       . Olmesartan-Amlodipine-HCTZ (TRIBENZOR) 40-10-25 MG TABS Take 1 tablet by mouth daily.       [name] omeprazole (PRILOSEC) 20 MG capsule Take 20 mg by mouth daily as needed. Alternates with prevacid depending upon cost For reflux        OBJECTIVE: Middle-aged [place] American woman who appears well Filed Vitals:   08/18/12 1322  BP: 124/66  Pulse: 104  Temp: 97.9 F (36.6 C)  Resp: 20     Body mass index is 43.02 kg/(m^2).    ECOG FS:1 Filed Weights   08/18/12 1322  Weight: 239 lb (108.41 kg)   Sclerae unicteric Oropharynx clear No cervical or supraclavicular adenopathy Lungs no rales  or rhonchi Heart regular rate and rhythm Abdomen soft, nontender, obese. Positive bowel sounds. MSK no focal spinal tenderness, no peripheral edema Neuro: nonfocal, alert and oriented x3 Breasts: The right breast is status post lumpectomy and a radiation. There is significant hyperpigmentation, but no desquamation. No evidence of local recurrence. Left breast is unremarkable. Axillae are benign bilaterally with no palpable adenopathy.   LAB RESULTS: Lab Results  Component Value Date   WBC 5.4 08/11/2012   NEUTROABS 3.6 08/11/2012   HGB 5.4* 08/11/2012   HCT 20.5* 08/11/2012   MCV 57.3* 08/11/2012   PLT 327 08/11/2012      Chemistry      Component Value Date/Time   NA 142 08/11/2012 1153   NA 139 04/08/2012 1529   K 4.1 08/11/2012 1153   K 4.1 04/08/2012 1529   CL 107 08/11/2012 1153   CL 104 04/08/2012 1529   CO2 28 08/11/2012 1153   CO2 27 04/08/2012 1529   BUN 31.0* 08/11/2012 1153   BUN 19 04/08/2012 1529   CREATININE 1.6* 08/11/2012 1153   CREATININE 1.02 04/08/2012 1529      Component Value Date/Time   CALCIUM 9.7 08/11/2012 1153   CALCIUM 9.3 04/08/2012  1529   ALKPHOS 73 08/11/2012 1153   ALKPHOS 69 03/29/2012 1618   AST 12 08/11/2012 1153   AST 17 03/29/2012 1618   ALT 13 08/11/2012 1153   ALT 16 03/29/2012 1618   BILITOT 0.29 08/11/2012 1153   BILITOT 0.2* 03/29/2012 1618     On 09/10/2012, ferritin was low at 2, vitamin B12 normal at 440, and folate was normal at 1147.   STUDIES:  No results found.   ASSESSMENT: 61 y.o. [name] woman s/p Right lumpectomy and sentinel lymph node sampling 03/16/2012 for a 1.8 cm ductal carcinoma in situ, high-grade, strongly estrogen and progesterone receptor positive.  (1) additional resection for improved margins July 2013  (2) completed adjuvant irradiation 06/29/2012  (3) started anastrozole October 2013  (4)  Iron deficiency anemia, s/p IV iron 08/12/12  PLAN:  With regards to her breast cancer, [name] is doing extremely well and will continue on the anastrozole as planned. She's Re: scheduled for a bone density in mid December and I will see her again in January to review those results.  In the meanwhile, we will continue to follow her very closely for her iron deficiency anemia. I am repeating a CBC today, then again along with a ferritin level on December 2. All of those labs will be repeated once again in January. She understands to contact us should she become more symptomatic, and we certainly can consider a blood transfusion at any time.  She knows to call for any problems that may develop before the next visit.  Eaven Schwager    08/18/2012

## 2012-08-20 ENCOUNTER — Other Ambulatory Visit: Payer: Self-pay | Admitting: Oncology

## 2012-08-29 ENCOUNTER — Other Ambulatory Visit (HOSPITAL_BASED_OUTPATIENT_CLINIC_OR_DEPARTMENT_OTHER): Payer: BC Managed Care – PPO

## 2012-08-29 DIAGNOSIS — C50419 Malignant neoplasm of upper-outer quadrant of unspecified female breast: Secondary | ICD-10-CM

## 2012-08-29 DIAGNOSIS — D509 Iron deficiency anemia, unspecified: Secondary | ICD-10-CM

## 2012-08-29 DIAGNOSIS — D649 Anemia, unspecified: Secondary | ICD-10-CM

## 2012-08-29 LAB — CBC & DIFF AND RETIC
Basophils Absolute: 0 10*3/uL (ref 0.0–0.1)
Eosinophils Absolute: 0.2 10*3/uL (ref 0.0–0.5)
HGB: 8.6 g/dL — ABNORMAL LOW (ref 11.6–15.9)
Immature Retic Fract: 23.7 % — ABNORMAL HIGH (ref 1.60–10.00)
MONO#: 0.3 10*3/uL (ref 0.1–0.9)
NEUT#: 3.6 10*3/uL (ref 1.5–6.5)
NEUT%: 73.3 % (ref 38.4–76.8)
RBC: 4.3 10*6/uL (ref 3.70–5.45)
Retic Ct Abs: 60.63 10*3/uL (ref 33.70–90.70)
WBC: 4.9 10*3/uL (ref 3.9–10.3)
lymph#: 0.9 10*3/uL (ref 0.9–3.3)

## 2012-08-29 LAB — COMPREHENSIVE METABOLIC PANEL (CC13)
ALT: 16 U/L (ref 0–55)
AST: 13 U/L (ref 5–34)
Albumin: 3.5 g/dL (ref 3.5–5.0)
BUN: 22 mg/dL (ref 7.0–26.0)
CO2: 29 mEq/L (ref 22–29)
Calcium: 10 mg/dL (ref 8.4–10.4)
Chloride: 105 mEq/L (ref 98–107)
Potassium: 4.4 mEq/L (ref 3.5–5.1)

## 2012-09-05 ENCOUNTER — Other Ambulatory Visit: Payer: Self-pay | Admitting: Oncology

## 2012-09-08 ENCOUNTER — Ambulatory Visit
Admission: RE | Admit: 2012-09-08 | Discharge: 2012-09-08 | Disposition: A | Discharge status: Home / Self Care | Payer: Self-pay | Source: Ambulatory Visit | Attending: Oncology | Admitting: Oncology

## 2012-09-08 DIAGNOSIS — D0510 Intraductal carcinoma in situ of unspecified breast: Secondary | ICD-10-CM

## 2012-09-29 ENCOUNTER — Other Ambulatory Visit: Payer: BC Managed Care – PPO | Admitting: Lab

## 2012-10-06 ENCOUNTER — Encounter: Payer: Self-pay | Admitting: Physician Assistant

## 2012-10-06 ENCOUNTER — Other Ambulatory Visit (HOSPITAL_BASED_OUTPATIENT_CLINIC_OR_DEPARTMENT_OTHER): Payer: BC Managed Care – PPO | Admitting: Lab

## 2012-10-06 ENCOUNTER — Other Ambulatory Visit: Payer: Self-pay | Admitting: *Deleted

## 2012-10-06 ENCOUNTER — Telehealth: Payer: Self-pay | Admitting: Oncology

## 2012-10-06 ENCOUNTER — Ambulatory Visit (HOSPITAL_BASED_OUTPATIENT_CLINIC_OR_DEPARTMENT_OTHER): Payer: BC Managed Care – PPO | Admitting: Physician Assistant

## 2012-10-06 VITALS — BP 146/78 | HR 90 | Temp 98.4°F | Resp 20 | Ht 62.5 in | Wt 237.0 lb

## 2012-10-06 DIAGNOSIS — C50419 Malignant neoplasm of upper-outer quadrant of unspecified female breast: Secondary | ICD-10-CM

## 2012-10-06 DIAGNOSIS — Z170 Estrogen receptor positive status [ER+]: Secondary | ICD-10-CM

## 2012-10-06 DIAGNOSIS — D0590 Unspecified type of carcinoma in situ of unspecified breast: Secondary | ICD-10-CM

## 2012-10-06 DIAGNOSIS — D509 Iron deficiency anemia, unspecified: Secondary | ICD-10-CM

## 2012-10-06 DIAGNOSIS — Z853 Personal history of malignant neoplasm of breast: Secondary | ICD-10-CM

## 2012-10-06 DIAGNOSIS — D0510 Intraductal carcinoma in situ of unspecified breast: Secondary | ICD-10-CM

## 2012-10-06 LAB — CBC & DIFF AND RETIC
Eosinophils Absolute: 0.3 10*3/uL (ref 0.0–0.5)
HGB: 9.9 g/dL — ABNORMAL LOW (ref 11.6–15.9)
Immature Retic Fract: 19.7 % — ABNORMAL HIGH (ref 1.60–10.00)
MCV: 71.6 fL — ABNORMAL LOW (ref 79.5–101.0)
MONO%: 12.8 % (ref 0.0–14.0)
NEUT#: 3.4 10*3/uL (ref 1.5–6.5)
RBC: 4.64 10*6/uL (ref 3.70–5.45)
RDW: 26.8 % — ABNORMAL HIGH (ref 11.2–14.5)
Retic %: 0.84 % (ref 0.70–2.10)
Retic Ct Abs: 38.98 10*3/uL (ref 33.70–90.70)
WBC: 4.8 10*3/uL (ref 3.9–10.3)
lymph#: 0.5 10*3/uL — ABNORMAL LOW (ref 0.9–3.3)

## 2012-10-06 LAB — COMPREHENSIVE METABOLIC PANEL (CC13)
ALT: 7 U/L (ref 0–55)
AST: 15 U/L (ref 5–34)
Albumin: 3.2 g/dL — ABNORMAL LOW (ref 3.5–5.0)
BUN: 18 mg/dL (ref 7.0–26.0)
Calcium: 9.8 mg/dL (ref 8.4–10.4)
Chloride: 102 mEq/L (ref 98–107)
Potassium: 4 mEq/L (ref 3.5–5.1)

## 2012-10-06 LAB — FERRITIN: Ferritin: 11 ng/mL (ref 10–291)

## 2012-10-06 MED ORDER — POLYSACCHARIDE IRON COMPLEX 150 MG PO CAPS: 150.0000 mg | ORAL_CAPSULE | Freq: Two times a day (BID) | ORAL | Status: DC

## 2012-10-06 NOTE — Progress Notes (Signed)
ID: [name]   DOB: 1950/11/10  MR#: [id number]  WUJ#:[id number]  PCP: Gwynneth Aliment, MD GYN: SUManus Rudd MD OTHER MD: Lonie Peak  HISTORY OF PRESENT ILLNESS: Ms. [name] had routine screening mammography at the breast center 01/21/2012. This showed a possible mass in the right breast. Additional views were obtained may 06/17/2012 and these confirmed an irregular mass in the upper outer right breast, which was palpable. Ultrasound showed a solid mass measuring 1.2 cm with a dilated duct extending medially from the mass. The right axilla was unremarkable by ultrasound.  Biopsy of the right breast mass on 02/11/2012 showed ([id number]) ductal carcinoma in situ, involving a papilloma. Estrogen receptor was 100% positive. Progesterone receptor was 92% positive. Breast MRI was obtained 02/19/2012. This confirmed an oval nonenhancing mass with lobulated margins in the upper outer quadrant of the right breast measuring 1.4 cm, with no additional suspicious masses and no axillary or internal mammary adenopathy.  Accordingly on 03/16/2012 the patient underwent right lumpectomy with sentinel lymph node sampling. This showed ([id number] [id number]) ductal carcinoma in situ, high-grade, measuring 1.8 cm, with negative but close margins, the closest being 1 mm. The sentinel lymph node was negative. The patient tolerated the surgery well. Her subsequent history is as detailed below.  INTERVAL HISTORY: [name] returns today for followup of her right breast cancer, as well as her iron deficiency anemia. She continues on anastrozole which she is tolerating well with no significant hot flashes, increased joint pain, or vaginal dryness. Her bone density in December was entirely normal.  She continues to have some fatigue, but definitely feels better than she felt 2 months ago. She received IV iron in mid November, and her ferritin level has improved from 2 in November to 110 in December. Today's results are pending.  Her hemoglobin has continued to improve as well. She denies any signs of abnormal bleeding. She continues on oral iron supplementation daily.   REVIEW OF SYSTEMS: [name] has had no recent illnesses other than a "cold"  and denies fevers, chills, or night sweats.  She's had some sinus congestion and a dry cough.  She continues to have some shortness of breath with exertion which has not worsened. She denies any chest pain or palpitations. She's had no abnormal headaches. She's had no increased joint pain, but continues to have some chronic knee pain she attributes to arthritis. She denies peripheral swelling. She's had no abnormal headaches or dizziness.  A detailed review of systems is otherwise noncontributory.   PAST MEDICAL HISTORY: Past Medical History  Diagnosis Date  . Bradycardia   . Iron deficiency anemia   . Hiatal hernia     large  . Diabetes mellitus     type 2  . Morbid obesity   . GERD (gastroesophageal reflux disease)   . Shortness of breath     with exertion  . Sinus drainage   . Breast cancer 03/16/12    right lumpectomy=high grade ductal ca in situ,ER/PR=positive  . Cancer     right breast ductal carcinoma in situ  . Hypertension     Echocardiogram 03/2011: EF 55-60%, grade 1 diastolic dysfunction, mild MAC, mild LAE  . Blood transfusion 03/14/12  . S/P radiation therapy 05/16/12 - 06/29/12    Right Breast: 50 Gray/ 25 Fractions with Boost: 10 Gray/ 5 Fractions  . Use of anastrozole (Arimidex) Started 10/13    PAST SURGICAL HISTORY: Past Surgical History  Procedure Date  . Breast lumpectomy 04/1997  .  Biospy right breast 02/01/12    Right Breast Needle Core Biopsy: ductal Carcinoma In situ with Papillary Features, ER/PR Positive  . Lumpectomy right breast 03/16/12    Ductal carcinoma In situ: clear margins: 0/1 Node Negative   she had a left lumpectomy in 1998, benign.  FAMILY HISTORY Family History  Problem Relation Age of Onset  . Coronary artery disease Father      heart attack  . Cancer Sister     breast  . Lymphoma Sister     Deceased at age 70   the patient's father died from heart disease at the age of 97. The patient's mother is alive, as of July of 20167, being 64 years old. The patient has 2 brothers and 4 sisters. One sister was diagnosed with lymphoma at age 64, and died from that disease at age 62. Her daughter, the patient's niece, had pancreatic cancer diagnosed at age 5. Another niece was diagnosed with breast cancer at age 55, and a second sister of the patient was diagnosed with breast cancer at age 58, and survives. Another sister has been diagnosed with renal cell carcinoma, terminal stage.  GYNECOLOGIC HISTORY: Menarche age 80, last menstrual period age 80. The patient did not take hormone replacement. She is GX P1, first live birth age 58.  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 Her husband [name] is a retired used Community education officer and currently drives a school bus. Son [name] lives in [place] and is an Producer, television/film/video. The patient has 2 biological grandchildren and 6 step grandchildren. She attends [employer] locally   ADVANCED DIRECTIVES: not in place  HEALTH MAINTENANCE: History  Substance Use Topics  . Smoking status: Never Smoker   . Smokeless tobacco: Never Used  . Alcohol Use: No     Colonoscopy: July 2012/[facility]  PAP:2012/ [facility]  Bone density:July 2012  Lipid panel:  No Known Allergies  Current Outpatient Prescriptions  Medication Sig Dispense Refill  . anastrozole (ARIMIDEX) 1 MG tablet Take 1 tablet (1 mg total) by mouth daily.  90 tablet  12  . benzonatate (TESSALON) 100 MG capsule       . calcium-vitamin D (OSCAL WITH D) 500-200 MG-UNIT per tablet Take 1 tablet by mouth 2 (two) times daily.      . cetirizine (ZYRTEC) 10 MG tablet Take 10 mg by mouth daily as needed. For allergies.      . hyaluronate sodium (RADIAPLEXRX)  GEL Apply 1 application topically 2 (two) times daily. Apply to right breast twice daily after treatment and at bedtime.      . iron polysaccharides (NIFEREX) 150 MG capsule Take 150 mg by mouth 2 (two) times daily.        [name] JANUMET XR 50-500 MG TB24       . Olmesartan-Amlodipine-HCTZ (TRIBENZOR) 40-10-25 MG TABS Take 1 tablet by mouth daily.       [name] omeprazole (PRILOSEC) 20 MG capsule Take 20 mg by mouth daily as needed. Alternates with prevacid depending upon cost For reflux        OBJECTIVE: Middle-aged [place] American woman who appears well Filed Vitals:   10/06/12 0936  BP: 146/78  Pulse: 90  Temp: 98.4 F (36.9 C)  Resp: 20     Body mass index is 42.66 kg/(m^2).    ECOG FS:1 Filed Weights   10/06/12 0936  Weight: 237 lb (107.502 kg)   Sclerae unicteric Oropharynx clear No  cervical or supraclavicular adenopathy Lungs no rales or rhonchi Heart regular rate and rhythm Abdomen soft, nontender, obese. Positive bowel sounds. MSK no focal spinal tenderness, no peripheral edema Neuro: nonfocal, alert and oriented x3 Breasts: The right breast is status post lumpectomy and radiation. There is significant hyperpigmentation, but no desquamation and no evidence of local recurrence. Left breast is unremarkable. Axillae are benign bilaterally with no palpable adenopathy.   LAB RESULTS: Lab Results  Component Value Date   WBC 4.9 08/29/2012   NEUTROABS 3.6 08/29/2012   HGB 8.6* 08/29/2012   HCT 30.2* 08/29/2012   MCV 70.2* 08/29/2012   PLT 327 08/29/2012      Chemistry      Component Value Date/Time   NA 140 10/06/2012 0919   NA 139 04/08/2012 1529   K 4.0 10/06/2012 0919   K 4.1 04/08/2012 1529   CL 102 10/06/2012 0919   CL 104 04/08/2012 1529   CO2 30* 10/06/2012 0919   CO2 27 04/08/2012 1529   BUN 18.0 10/06/2012 0919   BUN 19 04/08/2012 1529   CREATININE 1.3* 10/06/2012 0919   CREATININE 1.02 04/08/2012 1529      Component Value Date/Time   CALCIUM 9.8 10/06/2012 0919   CALCIUM 9.3  04/08/2012 1529   ALKPHOS 66 10/06/2012 0919   ALKPHOS 69 03/29/2012 1618   AST 15 10/06/2012 0919   AST 17 03/29/2012 1618   ALT 7 10/06/2012 0919   ALT 16 03/29/2012 1618   BILITOT 0.28 10/06/2012 0919   BILITOT 0.2* 03/29/2012 1618     On 08/29/2012, ferritin had improved to 110, as compared to 2 on 08/11/2012. Ferritin level is pending today, 10/06/2012.   STUDIES:  Bone density in 09/08/2012 was normal.   ASSESSMENT: 62 y.o. [place] woman s/p Right lumpectomy and sentinel lymph node sampling 03/16/2012 for a 1.8 cm ductal carcinoma in situ, high-grade, strongly estrogen and progesterone receptor positive.  (1) additional resection for improved margins July 2013  (2) completed adjuvant irradiation 06/29/2012  (3) started anastrozole October 2013  (4)  Iron deficiency anemia, s/p IV iron 08/12/12  PLAN:  With regards to her breast cancer, Pelagia continues to do well, and will continue on the anastrozole. She will see [place] for routine followup in early May, and prior to that appointment is scheduled for her repeat mammogram.  We'll continue to follow her iron deficiency anemia as well. We will repeat labs including a CBC, reticulocyte count, and ferritin level, in approximately 8 weeks. We will then repeat labs again when she returns in late April/early May. She'll continue with oral iron supplementation in the meanwhile, and of course we can consider IV iron and/or blood transfusion if and when that becomes necessary. is doing extremely well and will continue on the anastrozole as planned.   She knows to call for any problems that may develop before the next visit.  Elle Vezina    10/06/2012

## 2012-10-06 NOTE — Telephone Encounter (Signed)
gv pt appt schedule for April and May and Mammo for 5/1.

## 2012-10-07 ENCOUNTER — Other Ambulatory Visit: Payer: Self-pay | Admitting: Physician Assistant

## 2012-10-07 DIAGNOSIS — D509 Iron deficiency anemia, unspecified: Secondary | ICD-10-CM

## 2012-10-10 ENCOUNTER — Telehealth: Payer: Self-pay | Admitting: *Deleted

## 2012-10-10 NOTE — Telephone Encounter (Signed)
I have tried to call patient to rescheduled tomorrows appt. Needs to move from injection room to chemo room.  JMW

## 2012-10-10 NOTE — Telephone Encounter (Signed)
Feraheme injections, 2 weekly appts, approx 1/14 and 1/21; Labs only approx 2/15  Patient is aware

## 2012-10-11 ENCOUNTER — Ambulatory Visit: Payer: BC Managed Care – PPO

## 2012-10-13 ENCOUNTER — Ambulatory Visit (HOSPITAL_BASED_OUTPATIENT_CLINIC_OR_DEPARTMENT_OTHER): Payer: BC Managed Care – PPO

## 2012-10-13 VITALS — BP 137/71 | HR 78 | Temp 98.4°F

## 2012-10-13 DIAGNOSIS — D509 Iron deficiency anemia, unspecified: Secondary | ICD-10-CM

## 2012-10-13 MED ORDER — SODIUM CHLORIDE 0.9 % IV SOLN
Freq: Once | INTRAVENOUS | Status: AC
  Administered 2012-10-13: 14:00:00 via INTRAVENOUS

## 2012-10-13 MED ORDER — FERUMOXYTOL INJECTION 510 MG/17 ML
510.0000 mg | Freq: Once | INTRAVENOUS | Status: AC
  Administered 2012-10-13: 510 mg via INTRAVENOUS
  Filled 2012-10-13: qty 17

## 2012-10-13 NOTE — Patient Instructions (Signed)
Ferumoxytol injection What is this medicine? FERUMOXYTOL is an iron complex. Iron is used to make healthy red blood cells, which carry oxygen and nutrients throughout the body. This medicine is used to treat iron deficiency anemia in people with chronic kidney disease. This medicine may be used for other purposes; ask your health care provider or pharmacist if you have questions. What should I tell my health care provider before I take this medicine? They need to know if you have any of these conditions: -anemia not caused by low iron levels -high levels of iron in the blood -magnetic resonance imaging (MRI) test scheduled -an unusual or allergic reaction to iron, other medicines, foods, dyes, or preservatives -pregnant or trying to get pregnant -breast-feeding How should I use this medicine? This medicine is for infusion into a vein. It is given by a health care professional in a hospital or clinic setting. Talk to your pediatrician regarding the use of this medicine in children. Special care may be needed. Overdosage: If you think you've taken too much of this medicine contact a poison control center or emergency room at once. Overdosage: If you think you have taken too much of this medicine contact a poison control center or emergency room at once. NOTE: This medicine is only for you. Do not share this medicine with others. What if I miss a dose? It is important not to miss your dose. Call your doctor or health care professional if you are unable to keep an appointment. What may interact with this medicine? This medicine may interact with the following medications: -other iron products This list may not describe all possible interactions. Give your health care provider a list of all the medicines, herbs, non-prescription drugs, or dietary supplements you use. Also tell them if you smoke, drink alcohol, or use illegal drugs. Some items may interact with your medicine. What should I watch  for while using this medicine? Visit your doctor or healthcare professional regularly. Tell your doctor or healthcare professional if your symptoms do not start to get better or if they get worse. You may need blood work done while you are taking this medicine. You may need to follow a special diet. Talk to your doctor. Foods that contain iron include: whole grains/cereals, dried fruits, beans, or peas, leafy green vegetables, and organ meats (liver, kidney). What side effects may I notice from receiving this medicine? Side effects that you should report to your doctor or health care professional as soon as possible: -allergic reactions like skin rash, itching or hives, swelling of the face, lips, or tongue -breathing problems -changes in blood pressure -feeling faint or lightheaded, falls -fever or chills -flushing, sweating, or hot feelings -swelling of the ankles or feet Side effects that usually do not require medical attention (Report these to your doctor or health care professional if they continue or are bothersome.): -diarrhea -headache -nausea, vomiting -stomach pain This list may not describe all possible side effects. Call your doctor for medical advice about side effects. You may report side effects to FDA at 1-800-FDA-1088. Where should I keep my medicine? This drug is given in a hospital or clinic and will not be stored at home. NOTE: This sheet is a summary. It may not cover all possible information. If you have questions about this medicine, talk to your doctor, pharmacist, or health care provider.  2013, Elsevier/Gold Standard. (06/06/2008 9:48:25 PM)  

## 2012-10-18 ENCOUNTER — Ambulatory Visit: Payer: BC Managed Care – PPO

## 2012-11-11 ENCOUNTER — Other Ambulatory Visit: Payer: BC Managed Care – PPO | Admitting: Lab

## 2012-11-25 ENCOUNTER — Ambulatory Visit (INDEPENDENT_AMBULATORY_CARE_PROVIDER_SITE_OTHER): Payer: BC Managed Care – PPO | Admitting: Surgery

## 2012-11-27 IMAGING — CR DG CHEST 2V
2 series · 2 of 2 positions shown · non-contrast
Comparison: 04/08/2011.

CLINICAL DATA: Abnormal prior chest radiograph.  Lumpectomy with
right sentinel node biopsy.  Preoperative examination.

CHEST - 2 VIEW

[view not recorded (1 of 2)]
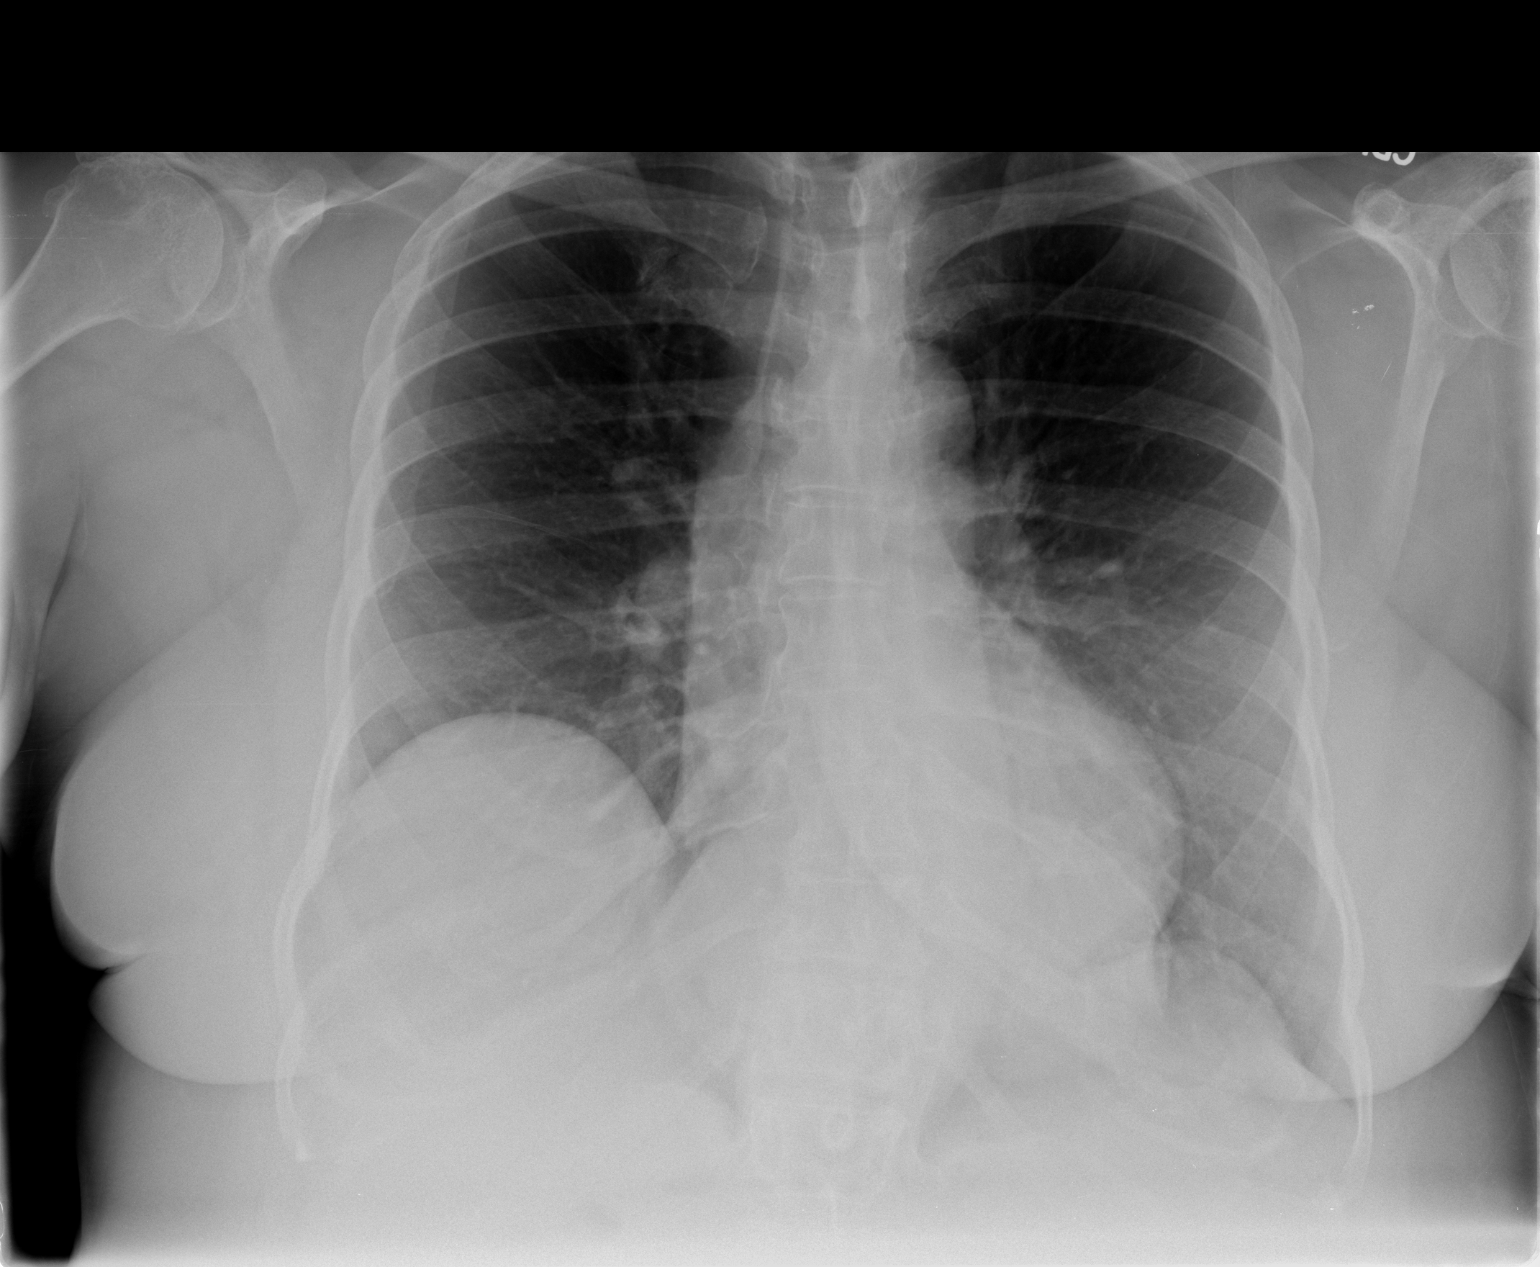

[view not recorded (2 of 2)]
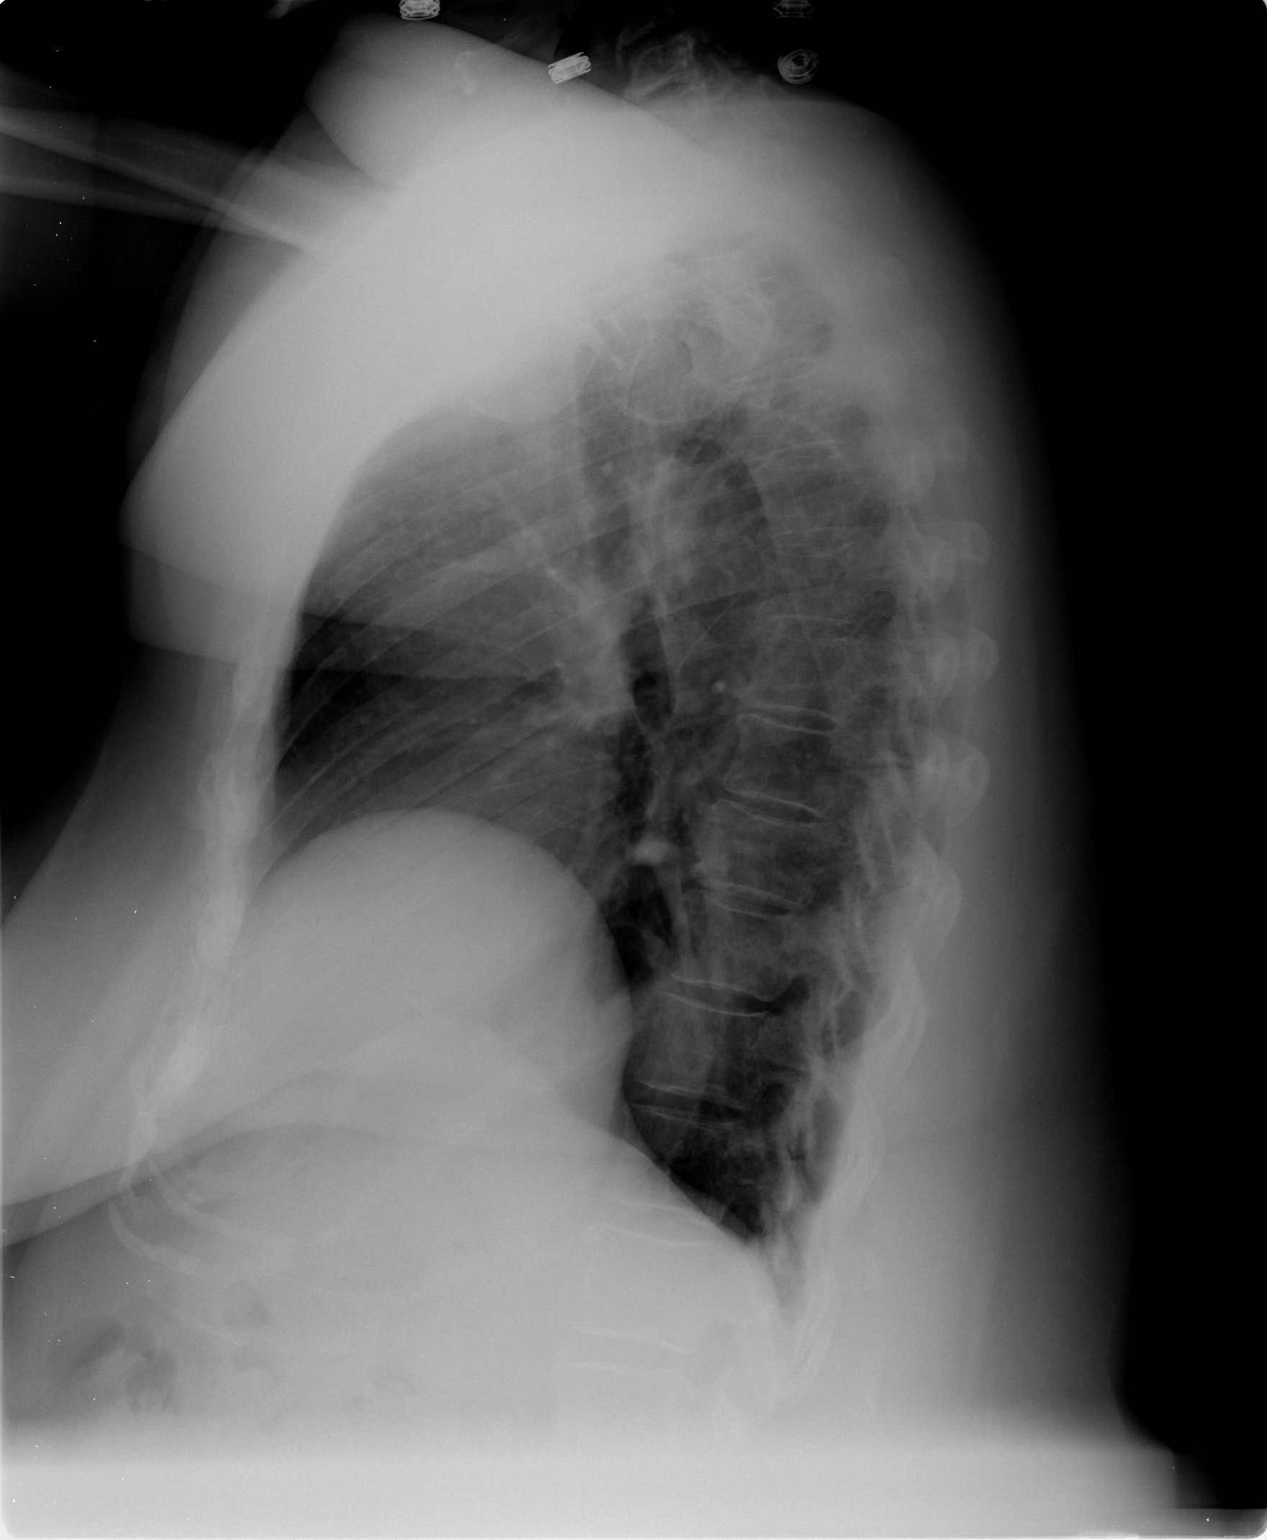

[2 of 2 positions shown; findings below may reference images not displayed]

FINDINGS: Elevation of the right hemidiaphragm persists.
Eventration of the left hemidiaphragm.  Cardiopericardial
silhouette is within normal limits on today's exam.  There is no
airspace disease.  No effusion.  High-riding right humeral head
compatible with chronic rotator cuff tear.  No plain film evidence
of adenopathy.
IMPRESSION: No active cardiopulmonary disease.

## 2012-12-05 ENCOUNTER — Other Ambulatory Visit: Payer: BC Managed Care – PPO | Admitting: Lab

## 2013-01-23 ENCOUNTER — Other Ambulatory Visit: Payer: BC Managed Care – PPO | Admitting: Lab

## 2013-01-26 ENCOUNTER — Other Ambulatory Visit: Payer: BC Managed Care – PPO

## 2013-01-26 ENCOUNTER — Ambulatory Visit
Admission: RE | Admit: 2013-01-26 | Discharge: 2013-01-26 | Disposition: A | Discharge status: Home / Self Care | Payer: BC Managed Care – PPO | Source: Ambulatory Visit | Attending: Physician Assistant | Admitting: Physician Assistant

## 2013-01-26 DIAGNOSIS — Z853 Personal history of malignant neoplasm of breast: Secondary | ICD-10-CM

## 2013-01-30 ENCOUNTER — Telehealth: Payer: Self-pay | Admitting: *Deleted

## 2013-01-30 NOTE — Telephone Encounter (Signed)
Tried to return pt call at this number as she requested, but no one answered....td

## 2013-02-02 ENCOUNTER — Ambulatory Visit (HOSPITAL_BASED_OUTPATIENT_CLINIC_OR_DEPARTMENT_OTHER): Payer: BC Managed Care – PPO | Admitting: Oncology

## 2013-02-02 ENCOUNTER — Telehealth: Payer: Self-pay | Admitting: Oncology

## 2013-02-02 ENCOUNTER — Ambulatory Visit (HOSPITAL_BASED_OUTPATIENT_CLINIC_OR_DEPARTMENT_OTHER): Payer: BC Managed Care – PPO | Admitting: Lab

## 2013-02-02 VITALS — BP 124/79 | HR 98 | Temp 98.5°F | Resp 20 | Ht 62.0 in | Wt 238.9 lb

## 2013-02-02 DIAGNOSIS — C50911 Malignant neoplasm of unspecified site of right female breast: Secondary | ICD-10-CM

## 2013-02-02 DIAGNOSIS — D0510 Intraductal carcinoma in situ of unspecified breast: Secondary | ICD-10-CM

## 2013-02-02 DIAGNOSIS — D509 Iron deficiency anemia, unspecified: Secondary | ICD-10-CM

## 2013-02-02 DIAGNOSIS — D0590 Unspecified type of carcinoma in situ of unspecified breast: Secondary | ICD-10-CM

## 2013-02-02 DIAGNOSIS — D0511 Intraductal carcinoma in situ of right breast: Secondary | ICD-10-CM

## 2013-02-02 DIAGNOSIS — Z170 Estrogen receptor positive status [ER+]: Secondary | ICD-10-CM

## 2013-02-02 LAB — CBC & DIFF AND RETIC
Basophils Absolute: 0 10*3/uL (ref 0.0–0.1)
EOS%: 4 % (ref 0.0–7.0)
HCT: 34.8 % (ref 34.8–46.6)
HGB: 10.7 g/dL — ABNORMAL LOW (ref 11.6–15.9)
MCH: 22.4 pg — ABNORMAL LOW (ref 25.1–34.0)
MCV: 72.8 fL — ABNORMAL LOW (ref 79.5–101.0)
MONO%: 7.8 % (ref 0.0–14.0)
NEUT%: 68.2 % (ref 38.4–76.8)

## 2013-02-02 LAB — COMPREHENSIVE METABOLIC PANEL (CC13)
Albumin: 3.2 g/dL — ABNORMAL LOW (ref 3.5–5.0)
CO2: 30 mEq/L — ABNORMAL HIGH (ref 22–29)
Glucose: 95 mg/dl (ref 70–99)
Potassium: 4 mEq/L (ref 3.5–5.1)
Sodium: 143 mEq/L (ref 136–145)
Total Protein: 7.2 g/dL (ref 6.4–8.3)

## 2013-02-02 MED ORDER — POLYSACCHARIDE IRON COMPLEX 150 MG PO CAPS: 150.0000 mg | ORAL_CAPSULE | Freq: Every morning | ORAL | Status: DC

## 2013-02-02 NOTE — Progress Notes (Signed)
ID: [name]   DOB: 1951/07/12  MR#: [id number]  CSN#:[id number]  PCP: Gwynneth Aliment, MD GYN: SUManus Rudd MD OTHER MD: Lonie Peak  HISTORY OF PRESENT ILLNESS: [name] had routine screening mammography at the breast center 01/21/2012. This showed a possible mass in the right breast. Additional views were obtained may 06/17/2012 and these confirmed an irregular mass in the upper outer right breast, which was palpable. Ultrasound showed a solid mass measuring 1.2 cm with a dilated duct extending medially from the mass. The right axilla was unremarkable by ultrasound.  Biopsy of the right breast mass on 02/11/2012 showed ([id number]) ductal carcinoma in situ, involving a papilloma. Estrogen receptor was 100% positive. Progesterone receptor was 92% positive. Breast MRI was obtained 02/19/2012. This confirmed an oval nonenhancing mass with lobulated margins in the upper outer quadrant of the right breast measuring 1.4 cm, with no additional suspicious masses and no axillary or internal mammary adenopathy.  Accordingly on 03/16/2012 the patient underwent right lumpectomy with sentinel lymph node sampling. This showed ([id number]) ductal carcinoma in situ, high-grade, measuring 1.8 cm, with negative but close margins, the closest being 1 mm. The sentinel lymph node was negative. The patient tolerated the surgery well. Her subsequent history is as detailed below.  INTERVAL HISTORY: [name] returns today for followup of her right breast cancer, as well as her iron deficiency anemia. The interval history is unremarkable. She is tolerating both the anastrozole and the Niferex without any side effects that she is aware of  REVIEW OF SYSTEMS: [name] really does not like to take calcium. We discussed the recent data today suggesting that perhaps calcium may not really contributed significantly to the treatment of osteopenia, but it is important for her to continue vitamin D. Otherwise a detailed  review of systems today was entirely negative.  PAST MEDICAL HISTORY: Past Medical History  Diagnosis Date  . Bradycardia   . Iron deficiency anemia   . Hiatal hernia     large  . Diabetes mellitus     type 2  . Morbid obesity   . GERD (gastroesophageal reflux disease)   . Shortness of breath     with exertion  . Sinus drainage   . Breast cancer 03/16/12    right lumpectomy=high grade ductal ca in situ,ER/PR=positive  . Cancer     right breast ductal carcinoma in situ  . Hypertension     Echocardiogram 03/2011: EF 55-60%, grade 1 diastolic dysfunction, mild MAC, mild LAE  . Blood transfusion 03/14/12  . S/P radiation therapy 05/16/12 - 06/29/12    Right Breast: 50 Gray/ 25 Fractions with Boost: 10 Gray/ 5 Fractions  . Use of anastrozole (Arimidex) Started 10/13    PAST SURGICAL HISTORY: Past Surgical History  Procedure Laterality Date  . Breast lumpectomy  04/1997  . Biospy right breast  02/01/12    Right Breast Needle Core Biopsy: ductal Carcinoma In situ with Papillary Features, ER/PR Positive  . Lumpectomy right breast  03/16/12    Ductal carcinoma In situ: clear margins: 0/1 Node Negative   she had a left lumpectomy in 1998, benign.  FAMILY HISTORY Family History  Problem Relation Age of Onset  . Coronary artery disease Father     heart attack  . Cancer Sister     breast  . Lymphoma Sister     Deceased at age 97   the patient's father died from heart disease at the age of 47. The patient's mother is alive,  as of July of 20171, being 64 years old. The patient has 2 brothers and 4 sisters. One sister was diagnosed with lymphoma at age 66, and died from that disease at age 64. Her daughter, the patient's niece, had pancreatic cancer diagnosed at age 34. Another niece was diagnosed with breast cancer at age 74, and a second sister of the patient was diagnosed with breast cancer at age 41, and survives. Another sister has been diagnosed with renal cell carcinoma, terminal  stage.  GYNECOLOGIC HISTORY: Menarche age 18, last menstrual period age 41. The patient did not take hormone replacement. She is GX P1, first live birth age 76.  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 Her husband [name] is a retired used Community education officer and currently drives a school bus. Son [name] lives in [place] and is an Producer, television/film/video. The patient has 2 biological grandchildren and 6 step grandchildren. She attends [employer] locally   ADVANCED DIRECTIVES: not in place  HEALTH MAINTENANCE: History  Substance Use Topics  . Smoking status: Never Smoker   . Smokeless tobacco: Never Used  . Alcohol Use: No     Colonoscopy: July 2012/[facility]  PAP:2012/ [facility]  Bone density:July 2012  Lipid panel:  No Known Allergies  Current Outpatient Prescriptions  Medication Sig Dispense Refill  . anastrozole (ARIMIDEX) 1 MG tablet Take 1 tablet (1 mg total) by mouth daily.  90 tablet  12  . benzonatate (TESSALON) 100 MG capsule       . calcium-vitamin D (OSCAL WITH D) 500-200 MG-UNIT per tablet Take 1 tablet by mouth 2 (two) times daily.      . cetirizine (ZYRTEC) 10 MG tablet Take 10 mg by mouth daily as needed. For allergies.      . hyaluronate sodium (RADIAPLEXRX) GEL Apply 1 application topically 2 (two) times daily. Apply to right breast twice daily after treatment and at bedtime.      . iron polysaccharides (NIFEREX) 150 MG capsule Take 1 capsule (150 mg total) by mouth 2 (two) times daily.  60 capsule  3  . JANUMET XR 50-500 MG TB24       . Olmesartan-Amlodipine-HCTZ (TRIBENZOR) 40-10-25 MG TABS Take 1 tablet by mouth daily.       [name] omeprazole (PRILOSEC) 20 MG capsule Take 20 mg by mouth daily as needed. Alternates with prevacid depending upon cost For reflux       No current facility-administered medications for this visit.    OBJECTIVE: Middle-aged [place] American woman in no acute distress  Filed Vitals:   02/02/13 1019  BP: 124/79  Pulse: 98  Temp: 98.5 F (36.9 C)  Resp: 20     Body mass index is 43.68 kg/(m^2).    ECOG FS:1 Filed Weights   02/02/13 1019  Weight: 238 lb 14.4 oz (108.364 kg)   Sclerae unicteric Oropharynx clear No cervical or supraclavicular adenopathy Lungs no rales or rhonchi Heart regular rate and rhythm Abdomen soft, nontender, obese. Positive bowel sounds. MSK no focal spinal tenderness, no peripheral edema Neuro: nonfocal, well oriented, up for. Affect Breasts: The right breast is status post lumpectomy and radiation. There is still some hyperpigmentation. The right axilla is benign. The left breast is unremarkable.  LAB RESULTS: Lab Results  Component Value Date   WBC 4.8 10/06/2012   NEUTROABS 3.4 10/06/2012   HGB 9.9* 10/06/2012   HCT 33.2* 10/06/2012   MCV 71.6* 10/06/2012   PLT  258 10/06/2012      Chemistry      Component Value Date/Time   NA 140 10/06/2012 0919   NA 139 04/08/2012 1529   K 4.0 10/06/2012 0919   K 4.1 04/08/2012 1529   CL 102 10/06/2012 0919   CL 104 04/08/2012 1529   CO2 30* 10/06/2012 0919   CO2 27 04/08/2012 1529   BUN 18.0 10/06/2012 0919   BUN 19 04/08/2012 1529   CREATININE 1.3* 10/06/2012 0919   CREATININE 1.02 04/08/2012 1529      Component Value Date/Time   CALCIUM 9.8 10/06/2012 0919   CALCIUM 9.3 04/08/2012 1529   ALKPHOS 66 10/06/2012 0919   ALKPHOS 69 03/29/2012 1618   AST 15 10/06/2012 0919   AST 17 03/29/2012 1618   ALT 7 10/06/2012 0919   ALT 16 03/29/2012 1618   BILITOT 0.28 10/06/2012 0919   BILITOT 0.2* 03/29/2012 1618     On 08/29/2012, ferritin had improved to 110, as compared to 2 on 08/11/2012. Ferritin level is pending today, 10/06/2012.   STUDIES: Mm Digital Diagnostic Bilat  01/26/2013  *RADIOLOGY REPORT*  Clinical Data:  Patient presents for a bilateral diagnostic mammogram due to a history of a prior right malignant lumpectomy June 2013 and radiation.  DIGITAL DIAGNOSTIC BILATERAL MAMMOGRAM WITH CAD  Comparison:  03/16/2012, 01/21/2012, 05/24/2009  Findings:  ACR Breast Density Category 2: There is a scattered fibroglandular pattern.  Exam demonstrates post lumpectomy changes of the upper outer right breast as well as post radiation changes.  Remainder of the exam is unchanged.  Mammographic images were processed with CAD.  IMPRESSION: Post lumpectomy changes and postradiation changes of the right breast.  RECOMMENDATION: Recommend continued annual bilateral diagnostic mammographic evaluation.  I have discussed the findings and recommendations with the patient. Results were also provided in writing at the conclusion of the visit.  If applicable, a reminder letter will be sent to the patient regarding her next appointment.  BI-RADS CATEGORY 2:  Benign finding(s).   Original Report Authenticated By: Elberta Fortis, M.D.    Bone density in 09/08/2012 was normal.   ASSESSMENT: 62 y.o. [place] woman s/p Right lumpectomy and sentinel lymph node sampling 03/16/2012 for a 1.8 cm ductal carcinoma in situ, high-grade, strongly estrogen and progesterone receptor positive.  (1) additional resection for improved margins July 2013  (2) completed adjuvant irradiation 06/29/2012  (3) started anastrozole October 2013  (4)  Iron deficiency anemia, s/p IV iron 08/12/12 and 10/13/2012  PLAN:  Lyndal is she was all in the plan is to continue that for a total of 5 years. As far as her breast cancer is concerned were going to start seeing her on a once a year basis. We are going to check her iron and hemoglobin today, and again in 6 and 12 months. She continues on the Niferex, which she seems to be tolerating well.  She knows to call for any problems that may develop before the next visit.  Jerrine Urschel C    02/02/2013

## 2013-05-11 ENCOUNTER — Other Ambulatory Visit: Payer: Self-pay | Admitting: Oncology

## 2013-05-11 DIAGNOSIS — D0591 Unspecified type of carcinoma in situ of right breast: Secondary | ICD-10-CM

## 2013-06-28 ENCOUNTER — Encounter: Payer: Self-pay | Admitting: Family Medicine

## 2013-06-28 LAB — HM DIABETES EYE EXAM

## 2013-07-13 ENCOUNTER — Other Ambulatory Visit: Payer: Self-pay | Admitting: Oncology

## 2013-07-13 DIAGNOSIS — C50911 Malignant neoplasm of unspecified site of right female breast: Secondary | ICD-10-CM

## 2013-08-10 ENCOUNTER — Other Ambulatory Visit: Payer: BC Managed Care – PPO

## 2013-12-14 ENCOUNTER — Telehealth: Payer: Self-pay | Admitting: *Deleted

## 2013-12-14 NOTE — Telephone Encounter (Signed)
This RN received call from Dr Oletta Lamas office stating pt's iron is low at 22 with heme of 8.5. Dr Oletta Lamas is requesting for pt to obtain IV iron per Dr Gerarda Fraction office. Pt has received IV iron thru this office previously.  This RN informed Estill Bamberg at Dr Oletta Lamas that appointment will be made and pt contacted.  POF made and sent to scheduling.  This note will be given to MD for review and order entry.

## 2013-12-18 ENCOUNTER — Telehealth: Payer: Self-pay | Admitting: Oncology

## 2013-12-18 NOTE — Telephone Encounter (Signed)
s/w pt re appt for 3/26

## 2013-12-21 ENCOUNTER — Ambulatory Visit (HOSPITAL_BASED_OUTPATIENT_CLINIC_OR_DEPARTMENT_OTHER): Payer: BC Managed Care – PPO

## 2013-12-21 ENCOUNTER — Other Ambulatory Visit: Payer: Self-pay | Admitting: Oncology

## 2013-12-21 VITALS — BP 151/82 | HR 82 | Temp 98.3°F | Resp 19

## 2013-12-21 DIAGNOSIS — D509 Iron deficiency anemia, unspecified: Secondary | ICD-10-CM

## 2013-12-21 DIAGNOSIS — Z853 Personal history of malignant neoplasm of breast: Secondary | ICD-10-CM

## 2013-12-21 MED ORDER — FERUMOXYTOL INJECTION 510 MG/17 ML
510.0000 mg | Freq: Once | INTRAVENOUS | Status: AC
  Administered 2013-12-21: 510 mg via INTRAVENOUS
  Filled 2013-12-21: qty 17

## 2013-12-21 MED ORDER — SODIUM CHLORIDE 0.9 % IV SOLN
Freq: Once | INTRAVENOUS | Status: AC
  Administered 2013-12-21: 16:00:00 via INTRAVENOUS

## 2013-12-21 NOTE — Progress Notes (Signed)
30 min observation completed, last feraheme was Jan 2014. Patient tolerated feraheme push well without complications. Val RN made aware that patient needs refill for iron.

## 2013-12-21 NOTE — Patient Instructions (Signed)

## 2013-12-27 ENCOUNTER — Telehealth: Payer: Self-pay | Admitting: Physician Assistant

## 2013-12-27 NOTE — Telephone Encounter (Signed)
cld pt per AB to chge appt for lab to 8:45.Adv per message if appt does not work to call us back

## 2014-01-05 ENCOUNTER — Other Ambulatory Visit: Payer: Self-pay | Admitting: Oncology

## 2014-01-05 DIAGNOSIS — D0590 Unspecified type of carcinoma in situ of unspecified breast: Secondary | ICD-10-CM

## 2014-02-01 ENCOUNTER — Ambulatory Visit
Admission: RE | Admit: 2014-02-01 | Discharge: 2014-02-01 | Disposition: A | Discharge status: Home / Self Care | Payer: BC Managed Care – PPO | Source: Ambulatory Visit | Attending: Oncology | Admitting: Oncology

## 2014-02-01 ENCOUNTER — Encounter (INDEPENDENT_AMBULATORY_CARE_PROVIDER_SITE_OTHER): Payer: Self-pay

## 2014-02-01 DIAGNOSIS — Z853 Personal history of malignant neoplasm of breast: Secondary | ICD-10-CM

## 2014-02-08 ENCOUNTER — Other Ambulatory Visit: Payer: Self-pay | Admitting: *Deleted

## 2014-02-08 ENCOUNTER — Other Ambulatory Visit (HOSPITAL_BASED_OUTPATIENT_CLINIC_OR_DEPARTMENT_OTHER): Payer: BC Managed Care – PPO

## 2014-02-08 DIAGNOSIS — C50919 Malignant neoplasm of unspecified site of unspecified female breast: Secondary | ICD-10-CM

## 2014-02-08 DIAGNOSIS — D509 Iron deficiency anemia, unspecified: Secondary | ICD-10-CM

## 2014-02-08 LAB — COMPREHENSIVE METABOLIC PANEL (CC13)
ALK PHOS: 84 U/L (ref 40–150)
ALT: 24 U/L (ref 0–55)
AST: 16 U/L (ref 5–34)
Albumin: 3.3 g/dL — ABNORMAL LOW (ref 3.5–5.0)
Anion Gap: 10 mEq/L (ref 3–11)
BUN: 17.4 mg/dL (ref 7.0–26.0)
CALCIUM: 9.8 mg/dL (ref 8.4–10.4)
CHLORIDE: 107 meq/L (ref 98–109)
CO2: 28 mEq/L (ref 22–29)
Creatinine: 1.1 mg/dL (ref 0.6–1.1)
Glucose: 114 mg/dl (ref 70–140)
Potassium: 4.1 mEq/L (ref 3.5–5.1)
Sodium: 145 mEq/L (ref 136–145)
Total Bilirubin: 0.24 mg/dL (ref 0.20–1.20)
Total Protein: 7.1 g/dL (ref 6.4–8.3)

## 2014-02-08 LAB — CBC WITH DIFFERENTIAL/PLATELET
BASO%: 0.5 % (ref 0.0–2.0)
Basophils Absolute: 0 10*3/uL (ref 0.0–0.1)
EOS%: 4.4 % (ref 0.0–7.0)
Eosinophils Absolute: 0.2 10*3/uL (ref 0.0–0.5)
HEMATOCRIT: 32 % — AB (ref 34.8–46.6)
HEMOGLOBIN: 9.5 g/dL — AB (ref 11.6–15.9)
LYMPH%: 22 % (ref 14.0–49.7)
MCH: 20.1 pg — AB (ref 25.1–34.0)
MCHC: 29.8 g/dL — ABNORMAL LOW (ref 31.5–36.0)
MCV: 67.4 fL — AB (ref 79.5–101.0)
MONO#: 0.4 10*3/uL (ref 0.1–0.9)
MONO%: 7.2 % (ref 0.0–14.0)
NEUT#: 3.7 10*3/uL (ref 1.5–6.5)
NEUT%: 65.9 % (ref 38.4–76.8)
Platelets: 288 10*3/uL (ref 145–400)
RBC: 4.75 10*6/uL (ref 3.70–5.45)
RDW: 26.6 % — ABNORMAL HIGH (ref 11.2–14.5)
WBC: 5.7 10*3/uL (ref 3.9–10.3)
lymph#: 1.2 10*3/uL (ref 0.9–3.3)

## 2014-02-08 LAB — FERRITIN CHCC: Ferritin: 10 ng/ml (ref 9–269)

## 2014-02-15 ENCOUNTER — Ambulatory Visit (HOSPITAL_BASED_OUTPATIENT_CLINIC_OR_DEPARTMENT_OTHER): Payer: BC Managed Care – PPO | Admitting: Physician Assistant

## 2014-02-15 ENCOUNTER — Encounter: Payer: Self-pay | Admitting: Physician Assistant

## 2014-02-15 ENCOUNTER — Telehealth: Payer: Self-pay | Admitting: Oncology

## 2014-02-15 ENCOUNTER — Ambulatory Visit (HOSPITAL_BASED_OUTPATIENT_CLINIC_OR_DEPARTMENT_OTHER): Payer: BC Managed Care – PPO

## 2014-02-15 ENCOUNTER — Telehealth: Payer: Self-pay | Admitting: *Deleted

## 2014-02-15 VITALS — BP 143/83 | HR 98 | Temp 98.2°F | Resp 18 | Ht 62.0 in | Wt 234.8 lb

## 2014-02-15 DIAGNOSIS — C50911 Malignant neoplasm of unspecified site of right female breast: Secondary | ICD-10-CM

## 2014-02-15 DIAGNOSIS — Z853 Personal history of malignant neoplasm of breast: Secondary | ICD-10-CM

## 2014-02-15 DIAGNOSIS — D509 Iron deficiency anemia, unspecified: Secondary | ICD-10-CM

## 2014-02-15 DIAGNOSIS — D0590 Unspecified type of carcinoma in situ of unspecified breast: Secondary | ICD-10-CM

## 2014-02-15 DIAGNOSIS — C50919 Malignant neoplasm of unspecified site of unspecified female breast: Secondary | ICD-10-CM

## 2014-02-15 DIAGNOSIS — Z780 Asymptomatic menopausal state: Secondary | ICD-10-CM

## 2014-02-15 LAB — IRON AND TIBC CHCC
%SAT: 8 % — ABNORMAL LOW (ref 21–57)
IRON: 30 ug/dL — AB (ref 41–142)
TIBC: 398 ug/dL (ref 236–444)
UIBC: 368 ug/dL (ref 120–384)

## 2014-02-15 LAB — CBC WITH DIFFERENTIAL/PLATELET
BASO%: 1 % (ref 0.0–2.0)
Basophils Absolute: 0.1 10*3/uL (ref 0.0–0.1)
EOS%: 4.5 % (ref 0.0–7.0)
Eosinophils Absolute: 0.3 10*3/uL (ref 0.0–0.5)
HCT: 33.7 % — ABNORMAL LOW (ref 34.8–46.6)
HGB: 10.1 g/dL — ABNORMAL LOW (ref 11.6–15.9)
LYMPH%: 22.6 % (ref 14.0–49.7)
MCH: 20 pg — AB (ref 25.1–34.0)
MCV: 67 fL — ABNORMAL LOW (ref 79.5–101.0)
MONO#: 0.5 10*3/uL (ref 0.1–0.9)
MONO%: 9.2 % (ref 0.0–14.0)
NEUT#: 3.5 10*3/uL (ref 1.5–6.5)
NEUT%: 62.7 % (ref 38.4–76.8)
Platelets: 314 10*3/uL (ref 145–400)
RBC: 5.03 10*6/uL (ref 3.70–5.45)
RDW: 25.2 % — ABNORMAL HIGH (ref 11.2–14.5)
WBC: 5.6 10*3/uL (ref 3.9–10.3)
lymph#: 1.3 10*3/uL (ref 0.9–3.3)

## 2014-02-15 MED ORDER — POLYSACCHARIDE IRON COMPLEX 150 MG PO CAPS: 150.0000 mg | ORAL_CAPSULE | Freq: Two times a day (BID) | ORAL | Status: DC

## 2014-02-15 MED ORDER — ANASTROZOLE 1 MG PO TABS: 1.0000 mg | ORAL_TABLET | Freq: Every day | ORAL | Status: DC

## 2014-02-15 NOTE — Telephone Encounter (Signed)
, °

## 2014-02-15 NOTE — Progress Notes (Signed)
ID: [name]   DOB: Nov 30, 1950  MR#: [id number]  [id number]  PCP: Maximino Greenland, MD GYN: SUDonnie Mesa MD OTHER MD: Eppie Gibson, MD;  Laurence Spates, MD  HISTORY OF PRESENT ILLNESS: [name] had routine screening mammography at the breast center 01/21/2012. This showed a possible mass in the right breast. Additional views were obtained may 06/17/2012 and these confirmed an irregular mass in the upper outer right breast, which was palpable. Ultrasound showed a solid mass measuring 1.2 cm with a dilated duct extending medially from the mass. The right axilla was unremarkable by ultrasound.  Biopsy of the right breast mass on 02/11/2012 showed ([id number]) ductal carcinoma in situ, involving a papilloma. Estrogen receptor was 100% positive. Progesterone receptor was 92% positive. Breast MRI was obtained 02/19/2012. This confirmed an oval nonenhancing mass with lobulated margins in the upper outer quadrant of the right breast measuring 1.4 cm, with no additional suspicious masses and no axillary or internal mammary adenopathy.  Accordingly on 03/16/2012 the patient underwent right lumpectomy with sentinel lymph node sampling. This showed (SZA [id number]) ductal carcinoma in situ, high-grade, measuring 1.8 cm, with negative but close margins, the closest being 1 mm. The sentinel lymph node was negative. The patient tolerated the surgery well.   Her subsequent history is as detailed below.  INTERVAL HISTORY: [name] returns alone today for followup of her right breast cancer and her iron deficiency anemia. With regards to the rest cancer, she continues on anastrozole with good tolerance. She's had no significant hot flashes and denies any vaginal changes, specifically no dryness or bleeding. She does have some joint pain associated with arthritis, especially in the knees, but this is stable and has not worsened with the anastrozole.  With regards to her iron deficiency anemia, she has been  followed closely by her gastroenterologist, Dr. Laurence Spates, and in fact was seen by him earlier this year. Interval history is notable for the fact that her iron dropped to 22 with a hemoglobin of 8.5 in March, according to labs drawn at Dr. Oletta Lamas' office. (Note today checked the iron and TIBC, but not ferritin).  [name] herself tells me that day also obtained a Hemoccult which was negative, and we are trying to get those results from Dr. Oletta Lamas' office. She denies seeing any blood in the stool and has had no dark tarry stools. She denies abnormal bleeding elsewhere.  [name] received IV fereheme infusion through our office on 12/21/2013. Her labs were repeated, and although there has been some improvement since March, they are still low. Her hemoglobin is still low at 10.1, with an MCV of 67.0; a ferritin normal but low at 10; and iron if 30; and a percent saturation of 8.  She is somewhat symptomatic of the anemia, and complains of fatigue in addition to shortness of breath with exertion. She continues on oral iron supplementation, but has been taking only one tablet daily. She's had no significant side effects or GI complaints.   REVIEW OF SYSTEMS: [name] has had no recent fevers, chills, or night sweats. She denies any rashes or skin changes. Her appetite is good, and she denies any nausea, emesis, or change in bowel or bladder habits. As noted above, she has shortness of breath with exertion. She also has a cough productive of clear or white phlegm.  She denies any chest pain or palpitations. She's had no abnormal headaches, dizziness, or change in vision. Other than her joint pain noted above, she's had no  additional myalgias or bony pain. She denies any peripheral swelling.  A detailed review of systems is otherwise stable and noncontributory.   PAST MEDICAL HISTORY: Past Medical History  Diagnosis Date  . Bradycardia   . Iron deficiency anemia   . Hiatal hernia     large  . Diabetes mellitus      type 2  . Morbid obesity   . GERD (gastroesophageal reflux disease)   . Shortness of breath     with exertion  . Sinus drainage   . Breast cancer 03/16/12    right lumpectomy=high grade ductal ca in situ,ER/PR=positive  . Cancer     right breast ductal carcinoma in situ  . Hypertension     Echocardiogram 03/2011: EF 73-71%, grade 1 diastolic dysfunction, mild MAC, mild LAE  . Blood transfusion 03/14/12  . S/P radiation therapy 05/16/12 - 06/29/12    Right Breast: 50 Gray/ 25 Fractions with Boost: 10 Gray/ 5 Fractions  . Use of anastrozole (Arimidex) Started 10/13    PAST SURGICAL HISTORY: Past Surgical History  Procedure Laterality Date  . Breast lumpectomy  04/1997  . Biospy right breast  02/01/12    Right Breast Needle Core Biopsy: ductal Carcinoma In situ with Papillary Features, ER/PR Positive  . Lumpectomy right breast  03/16/12    Ductal carcinoma In situ: clear margins: 0/1 Node Negative   she had a left lumpectomy in 1998, benign.  FAMILY HISTORY Family History  Problem Relation Age of Onset  . Coronary artery disease Father     heart attack  . Cancer Sister     breast  . Lymphoma Sister     Deceased at age 46   the patient's father died from heart disease at the age of 74. The patient's mother is alive, as of July of 20181, being 65 years old. The patient has 2 brothers and 4 sisters. One sister was diagnosed with lymphoma at age 40, and died from that disease at age 29. Her daughter, the patient's niece, had pancreatic cancer diagnosed at age 33. Another niece was diagnosed with breast cancer at age 70, and a second sister of the patient was diagnosed with breast cancer at age 68, and survives. Another sister has been diagnosed with renal cell carcinoma, terminal stage.  GYNECOLOGIC HISTORY:  (Reviewed 02/15/2014)  Menarche age 74, last menstrual period age 22. The patient did not take hormone replacement. She is GX P1, first live birth age 6.  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 Her husband [name] is a retired used Barrister's clerk and currently drives a school bus. Son [name] lives in [place] and is an Aeronautical engineer. The patient has 2 biological grandchildren and 6 step grandchildren. She attends [employer] locally   ADVANCED DIRECTIVES: not in place  HEALTH MAINTENANCE:  (updated 02/15/2014)  History  Substance Use Topics  . Smoking status: Never Smoker   . Smokeless tobacco: Never Used  . Alcohol Use: No     Colonoscopy: July 2012/[facility]  PAP:2012/ [facility]  Bone density: 09/08/2012 at [place], normal   Lipid panel:  Not on file    No Known Allergies  Current Outpatient Prescriptions  Medication Sig Dispense Refill  . anastrozole (ARIMIDEX) 1 MG tablet Take 1 tablet (1 mg total) by mouth daily.  90 tablet  3  . cetirizine (ZYRTEC) 10 MG tablet Take 10 mg by mouth daily as needed.  For allergies.      . cholecalciferol (VITAMIN D) 1000 UNITS tablet Take 1,000 Units by mouth daily.      . iron polysaccharides (POLY-IRON 150) 150 MG capsule Take 1 capsule (150 mg total) by mouth 2 (two) times daily.  60 capsule  3  . JANUMET XR 50-500 MG TB24       . Olmesartan-Amlodipine-HCTZ (TRIBENZOR) 40-10-25 MG TABS Take 1 tablet by mouth daily.       [name] omeprazole (PRILOSEC) 20 MG capsule Take 20 mg by mouth daily as needed. Alternates with prevacid depending upon cost For reflux      . calcium-vitamin D (OSCAL WITH D) 500-200 MG-UNIT per tablet Take 1 tablet by mouth 2 (two) times daily.      . hyaluronate sodium (RADIAPLEXRX) GEL Apply 1 application topically 2 (two) times daily. Apply to right breast twice daily after treatment and at bedtime.       No current facility-administered medications for this visit.    OBJECTIVE: Middle-aged [place] American woman in no acute distress Filed Vitals:   02/15/14 0905  BP: 143/83  Pulse:  98  Temp: 98.2 F (36.8 C)  Resp: 18     Body mass index is 42.93 kg/(m^2).    ECOG FS:1 Filed Weights   02/15/14 0905  Weight: 234 lb 12.8 oz (106.505 kg)   Physical Exam: HEENT:  Sclerae anicteric. Conjunctiva pink. Oropharynx clear and moist. Neck supple, trachea midline. NODES:  No cervical or supraclavicular lymphadenopathy palpated.  BREAST EXAM:  Right breast is status post lumpectomy. No suspicious nodularities or skin changes are noted on exam. No evidence of local recurrence. Left breast is unremarkable. Axillae are benign bilaterally, with no palpable lymphadenopathy. LUNGS:  Clear to auscultation bilaterally with good excursion.  No wheezes or rhonchi HEART:  Regular rate and rhythm. No murmur  ABDOMEN:  Soft, nontender. No organomegaly or masses palpated. Positive bowel sounds.  MSK:  No focal spinal tenderness to palpation. Good range of motion bilaterally in the upper extremities. EXTREMITIES:  No peripheral edema.  No lymphedema noted in the right upper extremity. SKIN:  No visible rashes. No excessive ecchymoses. No petechiae. No pallor. Good skin turgor. NEURO:  Nonfocal. Well oriented.   Appropriate affect.    LAB RESULTS:  Iron studies today show an iron level of 30; TIBC of 398; UIBC of 368; and percent saturation of 8. A ferritin drawn on 02/08/2014 was at the low end of normal at 10.   Lab Results  Component Value Date   WBC 5.6 02/15/2014   NEUTROABS 3.5 02/15/2014   HGB 10.1* 02/15/2014   HCT 33.7* 02/15/2014   MCV 67.0* 02/15/2014   PLT 314 02/15/2014      Chemistry      Component Value Date/Time   NA 145 02/08/2014 0947   NA 139 04/08/2012 1529   K 4.1 02/08/2014 0947   K 4.1 04/08/2012 1529   CL 104 02/02/2013 1127   CL 104 04/08/2012 1529   CO2 28 02/08/2014 0947   CO2 27 04/08/2012 1529   BUN 17.4 02/08/2014 0947   BUN 19 04/08/2012 1529   CREATININE 1.1 02/08/2014 0947   CREATININE 1.02 04/08/2012 1529      Component Value Date/Time   CALCIUM 9.8  02/08/2014 0947   CALCIUM 9.3 04/08/2012 1529   ALKPHOS 84 02/08/2014 0947   ALKPHOS 69 03/29/2012 1618   AST 16 02/08/2014 0947   AST 17 03/29/2012 1618   ALT 24 02/08/2014  0947   ALT 16 03/29/2012 1618   BILITOT 0.24 02/08/2014 0947   BILITOT 0.2* 03/29/2012 1618        STUDIES:  Mm Digital Diagnostic Bilat 02/01/2014   CLINICAL DATA:  Status post right lumpectomy and radiation therapy for breast cancer in 2014.  EXAM: DIGITAL DIAGNOSTIC  BILATERAL MAMMOGRAM WITH CAD  COMPARISON:  None.  ACR Breast Density Category b: There are scattered areas of fibroglandular density.  FINDINGS: Postlumpectomy changes on the right with improved postradiation changes. No new findings suspicious for malignancy in either breast.  Mammographic images were processed with CAD.  IMPRESSION: No evidence of malignancy.  RECOMMENDATION: Bilateral diagnostic mammogram in 1 year.  I have discussed the findings and recommendations with the patient. Results were also provided in writing at the conclusion of the visit. If applicable, a reminder letter will be sent to the patient regarding the next appointment.  BI-RADS CATEGORY  2: Benign.   Electronically Signed   By: Enrique Sack M.D.   On: 02/01/2014 08:50   Most recent bone density at [facility] on 09/08/2012 was normal.    ASSESSMENT: 63 y.o. [place] woman   (1)  s/p Right lumpectomy and sentinel lymph node sampling 03/16/2012 for a 1.8 cm ductal carcinoma in situ, high-grade, strongly estrogen and progesterone receptor positive.  (2) additional resection for improved margins July 2013  (3) completed adjuvant irradiation 06/29/2012  (4) started anastrozole October 2013, the goal being to continue for total of 5 years (until October 2018).  (5)  Iron deficiency anemia, s/p IV iron 08/12/12, 10/13/2012, and 12/21/2013.  Also on oral iron supplementation.   PLAN:  Dr. Jana Hakim has also reviewed Tiki's labs today. We are little concerned about her iron levels  have not improved more than they have since her IV infusion 2 months ago. We will proceed with one additional infusion of  fereheme and we'll reassess her labs approximately 4-6 weeks later. In the meanwhile, she'll also continue on her oral iron supplement which I have refilled today per her request.   Dr. Jana Hakim suggests additional GI followup, and in fact, Murray is scheduled to see Dr. Oletta Lamas again in August. In the meanwhile, she will return additional Hemoccult cards either to our office or his for further evaluation.    With regards to her breast cancer, she seems to be doing quite well, and we are making no changes to her regimen. She will continue on anastrozole daily. Her next bone density is due in December of this year to assess for osteoporosis, and her next mammogram will be due in May of 2016.  Pending no additional changes, we will plan on seeing Elnita on an annual basis, her next appointment here being in May of 2016 for labs and physical exam.  The above plan was reviewed in detail with [name] today. She voices both her understanding and agreement with our plan, and will call with any changes or problems.  Hera Celaya Milda Smart  PA-C   02/15/2014

## 2014-02-15 NOTE — Telephone Encounter (Signed)
Pt returned my phone call concerning lab results. Explained to pt that iron levels are still low(30) and she has been scheduled for IV iron per Micah Flesher, PA-C. Told pt scheduling will call her with appt and we will draw add'l labs as well at that time. I also told pt she will need to follow - up with Dr. Oletta Lamas in the weeks to come after the iron infusion. Pt verbalized understanding. Message to be forwarded to [employer], PA-C.

## 2014-02-16 ENCOUNTER — Telehealth: Payer: Self-pay | Admitting: *Deleted

## 2014-02-16 ENCOUNTER — Telehealth: Payer: Self-pay | Admitting: Oncology

## 2014-02-16 NOTE — Telephone Encounter (Signed)
Per staff message and POF I have scheduled appts.  JMW  

## 2014-02-16 NOTE — Telephone Encounter (Signed)
cld pt & left message of appt date & times. Adv I would mail sch out to address-print & mail copy

## 2014-02-23 ENCOUNTER — Ambulatory Visit (HOSPITAL_BASED_OUTPATIENT_CLINIC_OR_DEPARTMENT_OTHER): Payer: BC Managed Care – PPO

## 2014-02-23 VITALS — BP 171/81 | HR 99 | Temp 98.4°F | Resp 20

## 2014-02-23 DIAGNOSIS — D509 Iron deficiency anemia, unspecified: Secondary | ICD-10-CM

## 2014-02-23 MED ORDER — SODIUM CHLORIDE 0.9 % IV SOLN
Freq: Once | INTRAVENOUS | Status: AC
  Administered 2014-02-23: 14:00:00 via INTRAVENOUS

## 2014-02-23 MED ORDER — SODIUM CHLORIDE 0.9 % IV SOLN
510.0000 mg | Freq: Once | INTRAVENOUS | Status: AC
  Administered 2014-02-23: 510 mg via INTRAVENOUS
  Filled 2014-02-23: qty 17

## 2014-02-23 MED ORDER — FERUMOXYTOL INJECTION 510 MG/17 ML: 510.0000 mg | Freq: Once | INTRAVENOUS | Status: DC

## 2014-04-01 ENCOUNTER — Other Ambulatory Visit: Payer: Self-pay | Admitting: Oncology

## 2014-04-06 ENCOUNTER — Telehealth: Payer: Self-pay | Admitting: Oncology

## 2014-04-06 NOTE — Telephone Encounter (Signed)
r/s per pt rqst

## 2014-04-10 ENCOUNTER — Other Ambulatory Visit: Payer: Self-pay | Admitting: Surgical

## 2014-04-11 ENCOUNTER — Other Ambulatory Visit: Payer: Self-pay | Admitting: *Deleted

## 2014-04-11 ENCOUNTER — Other Ambulatory Visit: Payer: BC Managed Care – PPO

## 2014-04-11 DIAGNOSIS — C50919 Malignant neoplasm of unspecified site of unspecified female breast: Secondary | ICD-10-CM

## 2014-04-12 ENCOUNTER — Other Ambulatory Visit (HOSPITAL_BASED_OUTPATIENT_CLINIC_OR_DEPARTMENT_OTHER): Payer: BC Managed Care – PPO

## 2014-04-12 DIAGNOSIS — C50919 Malignant neoplasm of unspecified site of unspecified female breast: Secondary | ICD-10-CM

## 2014-04-12 DIAGNOSIS — D509 Iron deficiency anemia, unspecified: Secondary | ICD-10-CM

## 2014-04-12 DIAGNOSIS — D0590 Unspecified type of carcinoma in situ of unspecified breast: Secondary | ICD-10-CM

## 2014-04-12 LAB — COMPREHENSIVE METABOLIC PANEL (CC13)
ALT: 12 U/L (ref 0–55)
ANION GAP: 10 meq/L (ref 3–11)
AST: 14 U/L (ref 5–34)
Albumin: 3.5 g/dL (ref 3.5–5.0)
Alkaline Phosphatase: 77 U/L (ref 40–150)
BILIRUBIN TOTAL: 0.39 mg/dL (ref 0.20–1.20)
BUN: 19.7 mg/dL (ref 7.0–26.0)
CALCIUM: 9.8 mg/dL (ref 8.4–10.4)
CHLORIDE: 106 meq/L (ref 98–109)
CO2: 26 mEq/L (ref 22–29)
Creatinine: 1.1 mg/dL (ref 0.6–1.1)
Glucose: 99 mg/dl (ref 70–140)
Potassium: 4 mEq/L (ref 3.5–5.1)
Sodium: 142 mEq/L (ref 136–145)
Total Protein: 7.3 g/dL (ref 6.4–8.3)

## 2014-04-12 LAB — CBC WITH DIFFERENTIAL/PLATELET
BASO%: 0.9 % (ref 0.0–2.0)
BASOS ABS: 0.1 10*3/uL (ref 0.0–0.1)
EOS%: 3.2 % (ref 0.0–7.0)
Eosinophils Absolute: 0.2 10*3/uL (ref 0.0–0.5)
HEMATOCRIT: 38.4 % (ref 34.8–46.6)
HEMOGLOBIN: 11.7 g/dL (ref 11.6–15.9)
LYMPH#: 1 10*3/uL (ref 0.9–3.3)
LYMPH%: 18.1 % (ref 14.0–49.7)
MCH: 21.7 pg — ABNORMAL LOW (ref 25.1–34.0)
MCHC: 30.5 g/dL — AB (ref 31.5–36.0)
MCV: 71 fL — AB (ref 79.5–101.0)
MONO#: 0.4 10*3/uL (ref 0.1–0.9)
MONO%: 8 % (ref 0.0–14.0)
NEUT#: 3.8 10*3/uL (ref 1.5–6.5)
NEUT%: 69.8 % (ref 38.4–76.8)
Platelets: 281 10*3/uL (ref 145–400)
RBC: 5.42 10*6/uL (ref 3.70–5.45)
RDW: 23.1 % — ABNORMAL HIGH (ref 11.2–14.5)
WBC: 5.5 10*3/uL (ref 3.9–10.3)

## 2014-05-03 ENCOUNTER — Encounter (HOSPITAL_COMMUNITY): Payer: Self-pay | Admitting: Pharmacy Technician

## 2014-05-08 ENCOUNTER — Other Ambulatory Visit: Payer: Self-pay | Admitting: Surgical

## 2014-05-08 MED ORDER — DEXAMETHASONE SODIUM PHOSPHATE 10 MG/ML IJ SOLN
10.0000 mg | Freq: Once | INTRAMUSCULAR | Status: AC
  Administered 2014-05-15: 10 mg via INTRAVENOUS

## 2014-05-08 NOTE — H&P (Signed)
TOTAL KNEE ADMISSION H&P  Patient is being admitted for right total knee arthroplasty.  Subjective:  Chief Complaint:right knee pain.  HPI: [name], 63 y.o. female, has a history of pain and functional disability in the right knee due to arthritis and has failed non-surgical conservative treatments for greater than 12 weeks to includeNSAID's and/or analgesics, corticosteriod injections, flexibility and strengthening excercises and activity modification.  Onset of symptoms was gradual, starting 5 years ago with gradually worsening course since that time. The patient noted no past surgery on the right knee(s).  Patient currently rates pain in the right knee(s) at 7 out of 10 with activity. Patient has night pain, worsening of pain with activity and weight bearing, pain that interferes with activities of daily living, pain with passive range of motion, crepitus and joint swelling.  Patient has evidence of periarticular osteophytes and joint space narrowing by imaging studies. There is no active infection.  Patient Active Problem List   Diagnosis Date Noted  . Postmenopausal estrogen deficiency 02/15/2014  . Malignant neoplasm of upper-outer quadrant of female breast 05/04/2012  . Ductal carcinoma in situ of breast 03/29/2012  . Breast cancer 03/16/2012  . DCIS (ductal carcinoma in situ) of breast, right 02/24/2012  . Iron deficiency anemia 05/22/2011  . Hypertension 05/22/2011  . Chest pain 05/22/2011   Past Medical History  Diagnosis Date  . Bradycardia   . Iron deficiency anemia   . Hiatal hernia     large  . Diabetes mellitus     type 2  . Morbid obesity   . GERD (gastroesophageal reflux disease)   . Shortness of breath     with exertion  . Sinus drainage   . Breast cancer 03/16/12    right lumpectomy=high grade ductal ca in situ,ER/PR=positive  . Cancer     right breast ductal carcinoma in situ  . Hypertension     Echocardiogram 03/2011: EF 16-10%, grade 1 diastolic  dysfunction, mild MAC, mild LAE  . Blood transfusion 03/14/12  . S/P radiation therapy 05/16/12 - 06/29/12    Right Breast: 50 Gray/ 25 Fractions with Boost: 10 Gray/ 5 Fractions  . Use of anastrozole (Arimidex) Started 10/13    Past Surgical History  Procedure Laterality Date  . Breast lumpectomy  04/1997  . Biospy right breast  02/01/12    Right Breast Needle Core Biopsy: ductal Carcinoma In situ with Papillary Features, ER/PR Positive  . Lumpectomy right breast  03/16/12    Ductal carcinoma In situ: clear margins: 0/1 Node Negative     Current outpatient prescriptions: anastrozole (ARIMIDEX) 1 MG tablet, Take 1 tablet (1 mg total) by mouth daily., Disp: 90 tablet, Rfl: 3;   calcium-vitamin D (OSCAL WITH D) 500-200 MG-UNIT per tablet, Take 1 tablet by mouth 2 (two) times daily., Disp: , Rfl: ;   cetirizine (ZYRTEC) 10 MG tablet, Take 10 mg by mouth daily as needed. For allergies., Disp: , Rfl:  cholecalciferol (VITAMIN D) 1000 UNITS tablet, Take 1,000 Units by mouth daily., Disp: , Rfl: ;   ibuprofen (ADVIL,MOTRIN) 200 MG tablet, Take 400-600 mg by mouth every 6 (six) hours as needed (Pain)., Disp: , Rfl: ;   iron polysaccharides (POLY-IRON 150) 150 MG capsule, Take 1 capsule (150 mg total) by mouth 2 (two) times daily., Disp: 60 capsule, Rfl: 3;   JANUMET XR 50-500 MG TB24, Take 1 tablet by mouth daily. , Disp: , Rfl:  olmesartan-hydrochlorothiazide (BENICAR HCT) 40-25 MG per tablet, Take 1 tablet by  mouth every morning., Disp: , Rfl: ;   omeprazole (PRILOSEC) 20 MG capsule, Take 20 mg by mouth daily as needed (Acid reflux). Alternates with prevacid depending upon cost For reflux, Disp: , Rfl:   No Known Allergies  History  Substance Use Topics  . Smoking status: Never Smoker   . Smokeless tobacco: Never Used  . Alcohol Use: No    Family History  Problem Relation Age of Onset  . Coronary artery disease Father     heart attack  . Cancer Sister     breast  . Lymphoma Sister      Deceased at age 35     Review of Systems  Constitutional: Negative.   HENT: Negative.   Eyes: Negative.   Respiratory: Negative.   Cardiovascular: Negative.   Gastrointestinal: Positive for heartburn and vomiting. Negative for nausea, abdominal pain, diarrhea, constipation, blood in stool and melena.  Genitourinary: Positive for frequency.  Musculoskeletal: Positive for back pain and joint pain. Negative for falls, myalgias and neck pain.       Right knee pain  Skin: Negative.   Neurological: Negative.   Endo/Heme/Allergies: Negative.   Psychiatric/Behavioral: Negative.     Objective:  Physical Exam  Constitutional: She is oriented to person, place, and time. She appears well-developed and well-nourished. No distress.  HENT:  Head: Normocephalic and atraumatic.  Right Ear: External ear normal.  Left Ear: External ear normal.  Nose: Nose normal.  Mouth/Throat: Oropharynx is clear and moist.  Eyes: Conjunctivae and EOM are normal.  Neck: Normal range of motion. Neck supple.  Cardiovascular: Normal rate, regular rhythm, normal heart sounds and intact distal pulses.   No murmur heard. Respiratory: Effort normal and breath sounds normal. No respiratory distress. She has no wheezes.  GI: Soft. Bowel sounds are normal. She exhibits no distension. There is no tenderness.  Musculoskeletal:       Right hip: Normal.       Left hip: Normal.       Right knee: She exhibits decreased range of motion and swelling. She exhibits no effusion and no erythema. Tenderness found. Medial joint line and lateral joint line tenderness noted.       Left knee: Normal.       Right lower leg: She exhibits no tenderness and no swelling.       Left lower leg: She exhibits no tenderness and no swelling.  Neurological: She is alert and oriented to person, place, and time. She has normal strength and normal reflexes. No sensory deficit.  Skin: No rash noted. She is not diaphoretic. No erythema.  Psychiatric:  She has a normal mood and affect. Her behavior is normal.    Vitals  Weight: 239 lb Height: 62.5in Body Surface Area: 2.19 m Body Mass Index: 43.02 kg/m Pulse: 88 (Regular)  BP: 138/82 (Sitting, Left Arm, Standard)  Imaging Review Plain radiographs demonstrate severe degenerative joint disease of the right knee(s). The overall alignment ismild varus. The bone quality appears to be good for age and reported activity level.  Assessment/Plan:  End stage arthritis, right knee   The patient history, physical examination, clinical judgment of the provider and imaging studies are consistent with end stage degenerative joint disease of the right knee(s) and total knee arthroplasty is deemed medically necessary. The treatment options including medical management, injection therapy arthroscopy and arthroplasty were discussed at length. The risks and benefits of total knee arthroplasty were presented and reviewed. The risks due to aseptic loosening, infection, stiffness, patella  tracking problems, thromboembolic complications and other imponderables were discussed. The patient acknowledged the explanation, agreed to proceed with the plan and consent was signed. Patient is being admitted for inpatient treatment for surgery, pain control, PT, OT, prophylactic antibiotics, VTE prophylaxis, progressive ambulation and ADL's and discharge planning. The patient is planning to be discharged home with home health services  PCP: Dr. Glendale Chard Oncology: Dr. Lane Hacker, PA-C

## 2014-05-08 NOTE — Patient Instructions (Signed)
[name]  05/08/2014   Your procedure is scheduled on:  05/15/2014    Report to [facility].  Follow the Signs to [place] at  1030      am  Call this number if you have problems the morning of surgery: [phone]   Remember:   Do not eat food or drink liquids after midnight.   Take these medicines the morning of surgery with A SIP OF WATER:    Do not wear jewelry, make-up or nail polish.  Do not wear lotions, powders, or perfumes., deodorant    Do not shave 48 hours prior to surgery.   Do not bring valuables to the hospital.  Contacts, dentures or bridgework may not be worn into surgery.  Leave suitcase in the car. After surgery it may be brought to your room.  For patients admitted to the hospital, checkout time is 11:00 AM the day of  discharge.     [name] - Preparing for Surgery Before surgery, you can play an important role.  Because skin is not sterile, your skin needs to be as free of germs as possible.  You can reduce the number of germs on your skin by washing with CHG (chlorahexidine gluconate) soap before surgery.  CHG is an antiseptic cleaner which kills germs and bonds with the skin to continue killing germs even after washing. Please DO NOT use if you have an allergy to CHG or antibacterial soaps.  If your skin becomes reddened/irritated stop using the CHG and inform your nurse when you arrive at Short Stay. Do not shave (including legs and underarms) for at least 48 hours prior to the first CHG shower.  You may shave your face/neck. Please follow these instructions carefully:  1.  Shower with CHG Soap the night before surgery and the  morning of Surgery.  2.  If you choose to wash your hair, wash your hair first as usual with your  normal  shampoo.  3.  After you shampoo, rinse your hair and body thoroughly to remove the  shampoo.                           4.  Use CHG as you would any other liquid soap.  You can apply chg directly  to the skin  and wash                       Gently with a scrungie or clean washcloth.  5.  Apply the CHG Soap to your body ONLY FROM THE NECK DOWN.   Do not use on face/ open                           Wound or open sores. Avoid contact with eyes, ears mouth and genitals (private parts).                       Wash face,  Genitals (private parts) with your normal soap.             6.  Wash thoroughly, paying special attention to the area where your surgery  will be performed.  7.  Thoroughly rinse your body with warm water from the neck down.  8.  DO NOT shower/wash with your normal soap after using and rinsing off  the CHG Soap.  9.  Pat yourself dry with a clean towel.            10.  Wear clean pajamas.            11.  Place clean sheets on your bed the night of your first shower and do not  sleep with pets. Day of Surgery : Do not apply any lotions/deodorants the morning of surgery.  Please wear clean clothes to the hospital/surgery center.  FAILURE TO FOLLOW THESE INSTRUCTIONS MAY RESULT IN THE CANCELLATION OF YOUR SURGERY PATIENT SIGNATURE_________________________________  NURSE SIGNATURE__________________________________  ________________________________________________________________________  WHAT IS A BLOOD TRANSFUSION? Blood Transfusion Information  A transfusion is the replacement of blood or some of its parts. Blood is made up of multiple cells which provide different functions.  Red blood cells carry oxygen and are used for blood loss replacement.  White blood cells fight against infection.  Platelets control bleeding.  Plasma helps clot blood.  Other blood products are available for specialized needs, such as hemophilia or other clotting disorders. BEFORE THE TRANSFUSION  Who gives blood for transfusions?   Healthy volunteers who are fully evaluated to make sure their blood is safe. This is blood bank blood. Transfusion therapy is the safest it has ever been in  the practice of medicine. Before blood is taken from a donor, a complete history is taken to make sure that person has no history of diseases nor engages in risky social behavior (examples are intravenous drug use or sexual activity with multiple partners). The donor's travel history is screened to minimize risk of transmitting infections, such as malaria. The donated blood is tested for signs of infectious diseases, such as HIV and hepatitis. The blood is then tested to be sure it is compatible with you in order to minimize the chance of a transfusion reaction. If you or a relative donates blood, this is often done in anticipation of surgery and is not appropriate for emergency situations. It takes many days to process the donated blood. RISKS AND COMPLICATIONS Although transfusion therapy is very safe and saves many lives, the main dangers of transfusion include:   Getting an infectious disease.  Developing a transfusion reaction. This is an allergic reaction to something in the blood you were given. Every precaution is taken to prevent this. The decision to have a blood transfusion has been considered carefully by your caregiver before blood is given. Blood is not given unless the benefits outweigh the risks. AFTER THE TRANSFUSION  Right after receiving a blood transfusion, you will usually feel much better and more energetic. This is especially true if your red blood cells have gotten low (anemic). The transfusion raises the level of the red blood cells which carry oxygen, and this usually causes an energy increase.  The nurse administering the transfusion will monitor you carefully for complications. HOME CARE INSTRUCTIONS  No special instructions are needed after a transfusion. You may find your energy is better. Speak with your caregiver about any limitations on activity for underlying diseases you may have. SEEK MEDICAL CARE IF:   Your condition is not improving after your  transfusion.  You develop redness or irritation at the intravenous (IV) site. SEEK IMMEDIATE MEDICAL CARE IF:  Any of the following symptoms occur over the next 12 hours:  Shaking chills.  You have a temperature by mouth above 102 F (38.9 C), not controlled by medicine.  Chest, back, or muscle pain.  People around you feel you are not acting correctly or  are confused.  Shortness of breath or difficulty breathing.  Dizziness and fainting.  You get a rash or develop hives.  You have a decrease in urine output.  Your urine turns a dark color or changes to pink, red, or brown. Any of the following symptoms occur over the next 10 days:  You have a temperature by mouth above 102 F (38.9 C), not controlled by medicine.  Shortness of breath.  Weakness after normal activity.  The white part of the eye turns yellow (jaundice).  You have a decrease in the amount of urine or are urinating less often.  Your urine turns a dark color or changes to pink, red, or brown. Document Released: 09/11/2000 Document Revised: 12/07/2011 Document Reviewed: 04/30/2008 ExitCare Patient Information 2014 [place].  _______________________________________________________________________  Incentive Spirometer  An incentive spirometer is a tool that can help keep your lungs clear and active. This tool measures how well you are filling your lungs with each breath. Taking long deep breaths may help reverse or decrease the chance of developing breathing (pulmonary) problems (especially infection) following:  A long period of time when you are unable to move or be active. BEFORE THE PROCEDURE   If the spirometer includes an indicator to show your best effort, your nurse or respiratory therapist will set it to a desired goal.  If possible, sit up straight or lean slightly forward. Try not to slouch.  Hold the incentive spirometer in an upright position. INSTRUCTIONS FOR USE  1. Sit on the  edge of your bed if possible, or sit up as far as you can in bed or on a chair. 2. Hold the incentive spirometer in an upright position. 3. Breathe out normally. 4. Place the mouthpiece in your mouth and seal your lips tightly around it. 5. Breathe in slowly and as deeply as possible, raising the piston or the ball toward the top of the column. 6. Hold your breath for 3-5 seconds or for as long as possible. Allow the piston or ball to fall to the bottom of the column. 7. Remove the mouthpiece from your mouth and breathe out normally. 8. Rest for a few seconds and repeat Steps 1 through 7 at least 10 times every 1-2 hours when you are awake. Take your time and take a few normal breaths between deep breaths. 9. The spirometer may include an indicator to show your best effort. Use the indicator as a goal to work toward during each repetition. 10. After each set of 10 deep breaths, practice coughing to be sure your lungs are clear. If you have an incision (the cut made at the time of surgery), support your incision when coughing by placing a pillow or rolled up towels firmly against it. Once you are able to get out of bed, walk around indoors and cough well. You may stop using the incentive spirometer when instructed by your caregiver.  RISKS AND COMPLICATIONS  Take your time so you do not get dizzy or light-headed.  If you are in pain, you may need to take or ask for pain medication before doing incentive spirometry. It is harder to take a deep breath if you are having pain. AFTER USE  Rest and breathe slowly and easily.  It can be helpful to keep track of a log of your progress. Your caregiver can provide you with a simple table to help with this. If you are using the spirometer at home, follow these instructions: [place] IF:  You are having difficultly using the spirometer.  You have trouble using the spirometer as often as instructed.  Your pain medication is not giving enough  relief while using the spirometer.  You develop fever of 100.5 F (38.1 C) or higher. SEEK IMMEDIATE MEDICAL CARE IF:   You cough up bloody sputum that had not been present before.  You develop fever of 102 F (38.9 C) or greater.  You develop worsening pain at or near the incision site. MAKE SURE YOU:   Understand these instructions.  Will watch your condition.  Will get help right away if you are not doing well or get worse. Document Released: 01/25/2007 Document Revised: 12/07/2011 Document Reviewed: 03/28/2007 ExitCare Patient Information 2014 ExitCare, [place].   ________________________________________________________________________     Please read over the following fact sheets that you were given: MRSA Information, coughing and deep breathing exercises, leg exercises

## 2014-05-10 ENCOUNTER — Ambulatory Visit (HOSPITAL_COMMUNITY)
Admission: RE | Admit: 2014-05-10 | Discharge: 2014-05-10 | Disposition: A | Discharge status: Home / Self Care | Payer: BC Managed Care – PPO | Source: Ambulatory Visit | Attending: Surgical | Admitting: Surgical

## 2014-05-10 ENCOUNTER — Encounter (HOSPITAL_COMMUNITY): Payer: Self-pay

## 2014-05-10 ENCOUNTER — Encounter (HOSPITAL_COMMUNITY)
Admission: RE | Admit: 2014-05-10 | Discharge: 2014-05-10 | Disposition: A | Discharge status: Home / Self Care | Payer: BC Managed Care – PPO | Source: Ambulatory Visit | Attending: Orthopedic Surgery | Admitting: Orthopedic Surgery

## 2014-05-10 DIAGNOSIS — I10 Essential (primary) hypertension: Secondary | ICD-10-CM | POA: Insufficient documentation

## 2014-05-10 DIAGNOSIS — M1710 Unilateral primary osteoarthritis, unspecified knee: Secondary | ICD-10-CM | POA: Diagnosis not present

## 2014-05-10 DIAGNOSIS — Z6841 Body Mass Index (BMI) 40.0 and over, adult: Secondary | ICD-10-CM | POA: Diagnosis not present

## 2014-05-10 DIAGNOSIS — N183 Chronic kidney disease, stage 3 unspecified: Secondary | ICD-10-CM | POA: Diagnosis not present

## 2014-05-10 HISTORY — DX: Chronic kidney disease, unspecified: N18.9

## 2014-05-10 HISTORY — DX: Unspecified osteoarthritis, unspecified site: M19.90

## 2014-05-10 LAB — COMPREHENSIVE METABOLIC PANEL
ALT: 10 U/L (ref 0–35)
AST: 11 U/L (ref 0–37)
Albumin: 3.6 g/dL (ref 3.5–5.2)
Alkaline Phosphatase: 84 U/L (ref 39–117)
Anion gap: 12 (ref 5–15)
BUN: 20 mg/dL (ref 6–23)
CO2: 30 mEq/L (ref 19–32)
Calcium: 9.8 mg/dL (ref 8.4–10.5)
Chloride: 99 mEq/L (ref 96–112)
Creatinine, Ser: 1.12 mg/dL — ABNORMAL HIGH (ref 0.50–1.10)
GFR calc Af Amer: 58 mL/min — ABNORMAL LOW (ref >90)
GFR calc non Af Amer: 50 mL/min — ABNORMAL LOW (ref >90)
Glucose, Bld: 109 mg/dL — ABNORMAL HIGH (ref 70–99)
Potassium: 3.8 mEq/L (ref 3.7–5.3)
Sodium: 141 mEq/L (ref 137–147)
Total Bilirubin: 0.4 mg/dL (ref 0.3–1.2)
Total Protein: 7.6 g/dL (ref 6.0–8.3)

## 2014-05-10 LAB — URINALYSIS, ROUTINE W REFLEX MICROSCOPIC
Bilirubin Urine: NEGATIVE
Glucose, UA: NEGATIVE mg/dL
Hgb urine dipstick: NEGATIVE
Ketones, ur: NEGATIVE mg/dL
Leukocytes, UA: NEGATIVE
Nitrite: NEGATIVE
Protein, ur: NEGATIVE mg/dL
Specific Gravity, Urine: 1.023 (ref 1.005–1.030)
Urobilinogen, UA: 1 mg/dL (ref 0.0–1.0)
pH: 6 (ref 5.0–8.0)

## 2014-05-10 LAB — PROTIME-INR
INR: 1.04 (ref 0.00–1.49)
Prothrombin Time: 13.6 seconds (ref 11.6–15.2)

## 2014-05-10 LAB — CBC
HEMATOCRIT: 39.4 % (ref 36.0–46.0)
Hemoglobin: 11.9 g/dL — ABNORMAL LOW (ref 12.0–15.0)
MCH: 21.6 pg — ABNORMAL LOW (ref 26.0–34.0)
MCHC: 30.2 g/dL (ref 30.0–36.0)
MCV: 71.4 fL — AB (ref 78.0–100.0)
Platelets: 274 10*3/uL (ref 150–400)
RBC: 5.52 MIL/uL — ABNORMAL HIGH (ref 3.87–5.11)
RDW: 19.3 % — AB (ref 11.5–15.5)
WBC: 5.5 10*3/uL (ref 4.0–10.5)

## 2014-05-10 LAB — SURGICAL PCR SCREEN
MRSA, PCR: NEGATIVE
Staphylococcus aureus: NEGATIVE

## 2014-05-10 LAB — APTT: aPTT: 29 seconds (ref 24–37)

## 2014-05-10 NOTE — Progress Notes (Signed)
CMP results faxed via EPIC to Dr Gladstone Lighter.

## 2014-05-10 NOTE — Progress Notes (Signed)
Clearance note on chart from Dr Jana Hakim.  - dated 02/15/2014.

## 2014-05-15 ENCOUNTER — Inpatient Hospital Stay (HOSPITAL_COMMUNITY)
Admission: RE | Admit: 2014-05-15 | Discharge: 2014-05-17 | DRG: 470 | Disposition: A | Discharge status: Skilled Nursing Facility | Payer: BC Managed Care – PPO | Source: Ambulatory Visit | Attending: Orthopedic Surgery | Admitting: Orthopedic Surgery

## 2014-05-15 ENCOUNTER — Encounter (HOSPITAL_COMMUNITY): Payer: Self-pay | Admitting: *Deleted

## 2014-05-15 ENCOUNTER — Inpatient Hospital Stay (HOSPITAL_COMMUNITY): Payer: BC Managed Care – PPO | Admitting: Anesthesiology

## 2014-05-15 ENCOUNTER — Encounter (HOSPITAL_COMMUNITY): Payer: BC Managed Care – PPO | Admitting: Anesthesiology

## 2014-05-15 ENCOUNTER — Encounter (HOSPITAL_COMMUNITY)
Admission: RE | Disposition: A | Discharge status: Skilled Nursing Facility | Payer: Self-pay | Source: Ambulatory Visit | Attending: Orthopedic Surgery

## 2014-05-15 DIAGNOSIS — M1710 Unilateral primary osteoarthritis, unspecified knee: Principal | ICD-10-CM | POA: Diagnosis present

## 2014-05-15 DIAGNOSIS — Z96659 Presence of unspecified artificial knee joint: Secondary | ICD-10-CM

## 2014-05-15 DIAGNOSIS — N183 Chronic kidney disease, stage 3 unspecified: Secondary | ICD-10-CM | POA: Diagnosis present

## 2014-05-15 DIAGNOSIS — K449 Diaphragmatic hernia without obstruction or gangrene: Secondary | ICD-10-CM | POA: Diagnosis present

## 2014-05-15 DIAGNOSIS — M25469 Effusion, unspecified knee: Secondary | ICD-10-CM | POA: Diagnosis present

## 2014-05-15 DIAGNOSIS — I129 Hypertensive chronic kidney disease with stage 1 through stage 4 chronic kidney disease, or unspecified chronic kidney disease: Secondary | ICD-10-CM | POA: Diagnosis present

## 2014-05-15 DIAGNOSIS — Z79899 Other long term (current) drug therapy: Secondary | ICD-10-CM

## 2014-05-15 DIAGNOSIS — Z6841 Body Mass Index (BMI) 40.0 and over, adult: Secondary | ICD-10-CM | POA: Diagnosis present

## 2014-05-15 DIAGNOSIS — E119 Type 2 diabetes mellitus without complications: Secondary | ICD-10-CM | POA: Diagnosis present

## 2014-05-15 DIAGNOSIS — Z96651 Presence of right artificial knee joint: Secondary | ICD-10-CM

## 2014-05-15 DIAGNOSIS — Z923 Personal history of irradiation: Secondary | ICD-10-CM

## 2014-05-15 DIAGNOSIS — M898X9 Other specified disorders of bone, unspecified site: Secondary | ICD-10-CM | POA: Diagnosis present

## 2014-05-15 DIAGNOSIS — K219 Gastro-esophageal reflux disease without esophagitis: Secondary | ICD-10-CM | POA: Diagnosis present

## 2014-05-15 DIAGNOSIS — Z853 Personal history of malignant neoplasm of breast: Secondary | ICD-10-CM

## 2014-05-15 DIAGNOSIS — M1711 Unilateral primary osteoarthritis, right knee: Secondary | ICD-10-CM

## 2014-05-15 DIAGNOSIS — Z791 Long term (current) use of non-steroidal anti-inflammatories (NSAID): Secondary | ICD-10-CM

## 2014-05-15 DIAGNOSIS — Z8249 Family history of ischemic heart disease and other diseases of the circulatory system: Secondary | ICD-10-CM

## 2014-05-15 DIAGNOSIS — M24569 Contracture, unspecified knee: Secondary | ICD-10-CM | POA: Diagnosis present

## 2014-05-15 DIAGNOSIS — Z807 Family history of other malignant neoplasms of lymphoid, hematopoietic and related tissues: Secondary | ICD-10-CM

## 2014-05-15 HISTORY — DX: Presence of unspecified artificial knee joint: Z96.659

## 2014-05-15 HISTORY — PX: TOTAL KNEE ARTHROPLASTY: SHX125

## 2014-05-15 LAB — TYPE AND SCREEN
ABO/RH(D): A POS
Antibody Screen: NEGATIVE

## 2014-05-15 LAB — GLUCOSE, CAPILLARY
GLUCOSE-CAPILLARY: 150 mg/dL — AB (ref 70–99)
Glucose-Capillary: 131 mg/dL — ABNORMAL HIGH (ref 70–99)
Glucose-Capillary: 162 mg/dL — ABNORMAL HIGH (ref 70–99)

## 2014-05-15 SURGERY — ARTHROPLASTY, KNEE, TOTAL
Anesthesia: General | Site: Knee | Laterality: Right

## 2014-05-15 MED ORDER — LIDOCAINE HCL (CARDIAC) 20 MG/ML IV SOLN
INTRAVENOUS | Status: AC
  Filled 2014-05-15: qty 5

## 2014-05-15 MED ORDER — TRANEXAMIC ACID 100 MG/ML IV SOLN
1000.0000 mg | INTRAVENOUS | Status: AC
  Administered 2014-05-15: 1000 mg via INTRAVENOUS
  Filled 2014-05-15: qty 10

## 2014-05-15 MED ORDER — GLYCOPYRROLATE 0.2 MG/ML IJ SOLN
INTRAMUSCULAR | Status: AC
  Filled 2014-05-15: qty 2

## 2014-05-15 MED ORDER — GLYCOPYRROLATE 0.2 MG/ML IJ SOLN
INTRAMUSCULAR | Status: DC | PRN
  Administered 2014-05-15: 0.4 mg via INTRAVENOUS

## 2014-05-15 MED ORDER — SODIUM CHLORIDE 0.9 % IR SOLN
Status: DC | PRN
  Administered 2014-05-15: 1000 mL

## 2014-05-15 MED ORDER — NEOSTIGMINE METHYLSULFATE 10 MG/10ML IV SOLN
INTRAVENOUS | Status: AC
  Filled 2014-05-15: qty 1

## 2014-05-15 MED ORDER — ONDANSETRON HCL 4 MG/2ML IJ SOLN: 4.0000 mg | Freq: Four times a day (QID) | INTRAMUSCULAR | Status: DC | PRN

## 2014-05-15 MED ORDER — METFORMIN HCL ER 500 MG PO TB24
500.0000 mg | ORAL_TABLET | Freq: Every day | ORAL | Status: DC
  Administered 2014-05-16: 500 mg via ORAL
  Filled 2014-05-15 (×2): qty 1

## 2014-05-15 MED ORDER — CELECOXIB 200 MG PO CAPS
200.0000 mg | ORAL_CAPSULE | Freq: Two times a day (BID) | ORAL | Status: DC
  Administered 2014-05-15 – 2014-05-17 (×4): 200 mg via ORAL
  Filled 2014-05-15 (×5): qty 1

## 2014-05-15 MED ORDER — BISACODYL 5 MG PO TBEC: 5.0000 mg | DELAYED_RELEASE_TABLET | Freq: Every day | ORAL | Status: DC | PRN

## 2014-05-15 MED ORDER — CEFAZOLIN SODIUM-DEXTROSE 2-3 GM-% IV SOLR
INTRAVENOUS | Status: AC
  Filled 2014-05-15: qty 50

## 2014-05-15 MED ORDER — CEFAZOLIN SODIUM-DEXTROSE 2-3 GM-% IV SOLR
2.0000 g | INTRAVENOUS | Status: AC
  Administered 2014-05-15: 2 g via INTRAVENOUS

## 2014-05-15 MED ORDER — ACETAMINOPHEN 650 MG RE SUPP: 650.0000 mg | Freq: Four times a day (QID) | RECTAL | Status: DC | PRN

## 2014-05-15 MED ORDER — ROCURONIUM BROMIDE 100 MG/10ML IV SOLN
INTRAVENOUS | Status: AC
  Filled 2014-05-15: qty 1

## 2014-05-15 MED ORDER — NALOXONE HCL 0.4 MG/ML IJ SOLN
INTRAMUSCULAR | Status: DC | PRN
  Administered 2014-05-15: .16 mg via INTRAVENOUS
  Administered 2014-05-15: .04 mg via INTRAVENOUS

## 2014-05-15 MED ORDER — HYDROMORPHONE HCL PF 2 MG/ML IJ SOLN
INTRAMUSCULAR | Status: AC
  Filled 2014-05-15: qty 1

## 2014-05-15 MED ORDER — OXYCODONE-ACETAMINOPHEN 5-325 MG PO TABS
2.0000 | ORAL_TABLET | ORAL | Status: DC | PRN
  Administered 2014-05-16 (×2): 1 via ORAL
  Filled 2014-05-15 (×2): qty 2

## 2014-05-15 MED ORDER — ANASTROZOLE 1 MG PO TABS
1.0000 mg | ORAL_TABLET | Freq: Every day | ORAL | Status: DC
  Administered 2014-05-16 – 2014-05-17 (×2): 1 mg via ORAL
  Filled 2014-05-15 (×3): qty 1

## 2014-05-15 MED ORDER — PHENOL 1.4 % MT LIQD
1.0000 | OROMUCOSAL | Status: DC | PRN
  Filled 2014-05-15: qty 177

## 2014-05-15 MED ORDER — FLEET ENEMA 7-19 GM/118ML RE ENEM: 1.0000 | ENEMA | Freq: Once | RECTAL | Status: AC | PRN

## 2014-05-15 MED ORDER — FENTANYL CITRATE 0.05 MG/ML IJ SOLN
INTRAMUSCULAR | Status: AC
  Filled 2014-05-15: qty 5

## 2014-05-15 MED ORDER — HYDROCODONE-ACETAMINOPHEN 5-325 MG PO TABS
1.0000 | ORAL_TABLET | ORAL | Status: DC | PRN
  Administered 2014-05-15 – 2014-05-17 (×8): 1 via ORAL
  Filled 2014-05-15 (×9): qty 1

## 2014-05-15 MED ORDER — SODIUM CHLORIDE 0.9 % IJ SOLN
INTRAMUSCULAR | Status: DC | PRN
  Administered 2014-05-15: 20 mL via INTRAVENOUS

## 2014-05-15 MED ORDER — CEFAZOLIN SODIUM 1-5 GM-% IV SOLN
1.0000 g | Freq: Four times a day (QID) | INTRAVENOUS | Status: AC
  Administered 2014-05-15 – 2014-05-16 (×2): 1 g via INTRAVENOUS
  Filled 2014-05-15 (×2): qty 50

## 2014-05-15 MED ORDER — HYDROMORPHONE HCL PF 1 MG/ML IJ SOLN
0.2500 mg | INTRAMUSCULAR | Status: DC | PRN
  Administered 2014-05-15 (×3): 0.25 mg via INTRAVENOUS

## 2014-05-15 MED ORDER — MIDAZOLAM HCL 5 MG/5ML IJ SOLN
INTRAMUSCULAR | Status: DC | PRN
Start: 2014-05-15 — End: 2014-05-15
  Administered 2014-05-15: 2 mg via INTRAVENOUS

## 2014-05-15 MED ORDER — PROPOFOL 10 MG/ML IV BOLUS
INTRAVENOUS | Status: DC | PRN
  Administered 2014-05-15: 160 mg via INTRAVENOUS

## 2014-05-15 MED ORDER — RIVAROXABAN 10 MG PO TABS
10.0000 mg | ORAL_TABLET | Freq: Every day | ORAL | Status: DC
  Administered 2014-05-16 – 2014-05-17 (×2): 10 mg via ORAL
  Filled 2014-05-15 (×3): qty 1

## 2014-05-15 MED ORDER — ACETAMINOPHEN 325 MG PO TABS: 650.0000 mg | ORAL_TABLET | Freq: Four times a day (QID) | ORAL | Status: DC | PRN

## 2014-05-15 MED ORDER — SODIUM CHLORIDE 0.9 % IR SOLN
Status: DC | PRN
  Administered 2014-05-15: 13:00:00

## 2014-05-15 MED ORDER — FERROUS SULFATE 325 (65 FE) MG PO TABS
325.0000 mg | ORAL_TABLET | Freq: Three times a day (TID) | ORAL | Status: DC
  Administered 2014-05-16 – 2014-05-17 (×3): 325 mg via ORAL
  Filled 2014-05-15 (×7): qty 1

## 2014-05-15 MED ORDER — PROPOFOL 10 MG/ML IV BOLUS
INTRAVENOUS | Status: AC
  Filled 2014-05-15: qty 20

## 2014-05-15 MED ORDER — HYDROMORPHONE HCL PF 1 MG/ML IJ SOLN
INTRAMUSCULAR | Status: AC
  Filled 2014-05-15: qty 1

## 2014-05-15 MED ORDER — KETOROLAC TROMETHAMINE 30 MG/ML IJ SOLN
INTRAMUSCULAR | Status: AC
  Filled 2014-05-15: qty 1

## 2014-05-15 MED ORDER — CHLORHEXIDINE GLUCONATE 4 % EX LIQD: 60.0000 mL | Freq: Once | CUTANEOUS | Status: DC

## 2014-05-15 MED ORDER — BUPIVACAINE LIPOSOME 1.3 % IJ SUSP
20.0000 mL | Freq: Once | INTRAMUSCULAR | Status: AC
  Administered 2014-05-15: 20 mL
  Filled 2014-05-15: qty 20

## 2014-05-15 MED ORDER — METHOCARBAMOL 500 MG PO TABS
500.0000 mg | ORAL_TABLET | Freq: Four times a day (QID) | ORAL | Status: DC | PRN
  Administered 2014-05-15 – 2014-05-16 (×4): 500 mg via ORAL
  Filled 2014-05-15 (×4): qty 1

## 2014-05-15 MED ORDER — ACETAMINOPHEN 10 MG/ML IV SOLN
1000.0000 mg | Freq: Once | INTRAVENOUS | Status: AC
  Administered 2014-05-15: 1000 mg via INTRAVENOUS
  Filled 2014-05-15: qty 100

## 2014-05-15 MED ORDER — SUCCINYLCHOLINE CHLORIDE 20 MG/ML IJ SOLN
INTRAMUSCULAR | Status: DC | PRN
  Administered 2014-05-15: 100 mg via INTRAVENOUS

## 2014-05-15 MED ORDER — HYDROMORPHONE HCL PF 1 MG/ML IJ SOLN: 1.0000 mg | INTRAMUSCULAR | Status: DC | PRN

## 2014-05-15 MED ORDER — LIDOCAINE HCL (CARDIAC) 20 MG/ML IV SOLN
INTRAVENOUS | Status: DC | PRN
  Administered 2014-05-15: 100 mg via INTRAVENOUS
  Administered 2014-05-15: 50 mg via INTRAVENOUS

## 2014-05-15 MED ORDER — KETOROLAC TROMETHAMINE 30 MG/ML IJ SOLN
30.0000 mg | Freq: Once | INTRAMUSCULAR | Status: AC
  Administered 2014-05-15: 30 mg via INTRAVENOUS

## 2014-05-15 MED ORDER — FENTANYL CITRATE 0.05 MG/ML IJ SOLN
INTRAMUSCULAR | Status: DC | PRN
  Administered 2014-05-15 (×3): 50 ug via INTRAVENOUS
  Administered 2014-05-15: 100 ug via INTRAVENOUS

## 2014-05-15 MED ORDER — MIDAZOLAM HCL 2 MG/2ML IJ SOLN
INTRAMUSCULAR | Status: AC
  Filled 2014-05-15: qty 2

## 2014-05-15 MED ORDER — MENTHOL 3 MG MT LOZG
1.0000 | LOZENGE | OROMUCOSAL | Status: DC | PRN
  Filled 2014-05-15: qty 9

## 2014-05-15 MED ORDER — ALUM & MAG HYDROXIDE-SIMETH 200-200-20 MG/5ML PO SUSP: 30.0000 mL | ORAL | Status: DC | PRN

## 2014-05-15 MED ORDER — ONDANSETRON HCL 4 MG PO TABS
4.0000 mg | ORAL_TABLET | Freq: Four times a day (QID) | ORAL | Status: DC | PRN
Start: 2014-05-15 — End: 2014-05-17

## 2014-05-15 MED ORDER — THROMBIN 5000 UNITS EX SOLR
CUTANEOUS | Status: AC
  Filled 2014-05-15: qty 5000

## 2014-05-15 MED ORDER — THROMBIN 5000 UNITS EX SOLR
CUTANEOUS | Status: DC | PRN
  Administered 2014-05-15: 5000 [IU] via TOPICAL

## 2014-05-15 MED ORDER — SITAGLIP PHOS-METFORMIN HCL ER 50-500 MG PO TB24: 1.0000 | ORAL_TABLET | Freq: Every day | ORAL | Status: DC

## 2014-05-15 MED ORDER — LACTATED RINGERS IV SOLN
INTRAVENOUS | Status: DC
  Administered 2014-05-16: 02:00:00 via INTRAVENOUS

## 2014-05-15 MED ORDER — LACTATED RINGERS IV SOLN
INTRAVENOUS | Status: DC
  Administered 2014-05-15 (×2): via INTRAVENOUS
  Administered 2014-05-15 (×2): 1000 mL via INTRAVENOUS

## 2014-05-15 MED ORDER — OLMESARTAN MEDOXOMIL-HCTZ 40-25 MG PO TABS: 1.0000 | ORAL_TABLET | Freq: Every morning | ORAL | Status: DC

## 2014-05-15 MED ORDER — LINAGLIPTIN 5 MG PO TABS
5.0000 mg | ORAL_TABLET | Freq: Every day | ORAL | Status: DC
  Administered 2014-05-16 – 2014-05-17 (×2): 5 mg via ORAL
  Filled 2014-05-15 (×3): qty 1

## 2014-05-15 MED ORDER — HYDROCHLOROTHIAZIDE 25 MG PO TABS
25.0000 mg | ORAL_TABLET | Freq: Every day | ORAL | Status: DC
  Administered 2014-05-16 – 2014-05-17 (×2): 25 mg via ORAL
  Filled 2014-05-15 (×2): qty 1

## 2014-05-15 MED ORDER — ROCURONIUM BROMIDE 100 MG/10ML IV SOLN
INTRAVENOUS | Status: DC | PRN
  Administered 2014-05-15: 5 mg via INTRAVENOUS
  Administered 2014-05-15: 25 mg via INTRAVENOUS

## 2014-05-15 MED ORDER — IRBESARTAN 300 MG PO TABS
300.0000 mg | ORAL_TABLET | Freq: Every day | ORAL | Status: DC
  Administered 2014-05-16 – 2014-05-17 (×2): 300 mg via ORAL
  Filled 2014-05-15 (×2): qty 1

## 2014-05-15 MED ORDER — METHOCARBAMOL 1000 MG/10ML IJ SOLN
500.0000 mg | Freq: Four times a day (QID) | INTRAMUSCULAR | Status: DC | PRN
  Administered 2014-05-15: 500 mg via INTRAVENOUS
  Filled 2014-05-15: qty 5

## 2014-05-15 MED ORDER — NEOSTIGMINE METHYLSULFATE 10 MG/10ML IV SOLN
INTRAVENOUS | Status: DC | PRN
  Administered 2014-05-15: 2 mg via INTRAVENOUS

## 2014-05-15 MED ORDER — POLYETHYLENE GLYCOL 3350 17 G PO PACK: 17.0000 g | PACK | Freq: Every day | ORAL | Status: DC | PRN

## 2014-05-15 MED ORDER — HYDROMORPHONE HCL PF 1 MG/ML IJ SOLN
INTRAMUSCULAR | Status: DC | PRN
  Administered 2014-05-15 (×2): 0.5 mg via INTRAVENOUS
  Administered 2014-05-15: 1 mg via INTRAVENOUS

## 2014-05-15 MED ORDER — SODIUM CHLORIDE 0.9 % IJ SOLN
INTRAMUSCULAR | Status: AC
  Filled 2014-05-15: qty 20

## 2014-05-15 MED ORDER — PANTOPRAZOLE SODIUM 40 MG PO TBEC
40.0000 mg | DELAYED_RELEASE_TABLET | Freq: Every day | ORAL | Status: DC
  Administered 2014-05-16 – 2014-05-17 (×2): 40 mg via ORAL
  Filled 2014-05-15 (×2): qty 1

## 2014-05-15 MED ORDER — INSULIN ASPART 100 UNIT/ML ~~LOC~~ SOLN
0.0000 [IU] | Freq: Three times a day (TID) | SUBCUTANEOUS | Status: DC
  Administered 2014-05-16 – 2014-05-17 (×2): 2 [IU] via SUBCUTANEOUS

## 2014-05-15 MED ORDER — ONDANSETRON HCL 4 MG/2ML IJ SOLN
INTRAMUSCULAR | Status: DC | PRN
  Administered 2014-05-15: 4 mg via INTRAVENOUS

## 2014-05-15 MED ORDER — PROMETHAZINE HCL 25 MG/ML IJ SOLN: 6.2500 mg | INTRAMUSCULAR | Status: DC | PRN

## 2014-05-15 SURGICAL SUPPLY — 78 items
ADH SKN CLS APL DERMABOND .7 (GAUZE/BANDAGES/DRESSINGS) ×1
BAG SPEC THK2 15X12 ZIP CLS (MISCELLANEOUS) ×1
BAG ZIPLOCK 12X15 (MISCELLANEOUS) ×1 IMPLANT
BANDAGE ELASTIC 4 VELCRO ST LF (GAUZE/BANDAGES/DRESSINGS) ×2 IMPLANT
BANDAGE ELASTIC 6 VELCRO ST LF (GAUZE/BANDAGES/DRESSINGS) IMPLANT
BANDAGE ESMARK 6X9 LF (GAUZE/BANDAGES/DRESSINGS) ×1 IMPLANT
BLADE SAG 18X100X1.27 (BLADE) ×2 IMPLANT
BLADE SAW SGTL 11.0X1.19X90.0M (BLADE) ×2 IMPLANT
BNDG CMPR 9X6 STRL LF SNTH (GAUZE/BANDAGES/DRESSINGS) ×1
BNDG CMPR MED 15X6 ELC VLCR LF (GAUZE/BANDAGES/DRESSINGS) ×1
BNDG ELASTIC 6X15 VLCR STRL LF (GAUZE/BANDAGES/DRESSINGS) ×1 IMPLANT
BNDG ESMARK 6X9 LF (GAUZE/BANDAGES/DRESSINGS) ×2
BONE CEMENT GENTAMICIN (Cement) ×4 IMPLANT
CAP UPCHARGE REVISION TRAY ×1 IMPLANT
CAPT RP KNEE ×1 IMPLANT
CEMENT BONE GENTAMICIN 40 (Cement) ×2 IMPLANT
CUFF TOURN SGL QUICK 34 (TOURNIQUET CUFF) ×2
CUFF TRNQT CYL 34X4X40X1 (TOURNIQUET CUFF) ×1 IMPLANT
DERMABOND ADVANCED (GAUZE/BANDAGES/DRESSINGS) ×1
DERMABOND ADVANCED .7 DNX12 (GAUZE/BANDAGES/DRESSINGS) ×1 IMPLANT
DRAPE EXTREMITY T 121X128X90 (DRAPE) ×2 IMPLANT
DRAPE INCISE IOBAN 66X45 STRL (DRAPES) ×2 IMPLANT
DRAPE LG THREE QUARTER DISP (DRAPES) ×1 IMPLANT
DRAPE POUCH INSTRU U-SHP 10X18 (DRAPES) ×2 IMPLANT
DRAPE U-SHAPE 47X51 STRL (DRAPES) ×2 IMPLANT
DRSG AQUACEL AG ADV 3.5X10 (GAUZE/BANDAGES/DRESSINGS) ×2 IMPLANT
DRSG AQUACEL AG ADV 3.5X14 (GAUZE/BANDAGES/DRESSINGS) ×2 IMPLANT
DRSG PAD ABDOMINAL 8X10 ST (GAUZE/BANDAGES/DRESSINGS) IMPLANT
DRSG TEGADERM 4X4.75 (GAUZE/BANDAGES/DRESSINGS) ×1 IMPLANT
DURAPREP 26ML APPLICATOR (WOUND CARE) ×2 IMPLANT
ELECT REM PT RETURN 15FT ADLT (MISCELLANEOUS) ×2 IMPLANT
ELECT REM PT RETURN 9FT ADLT (ELECTROSURGICAL)
ELECTRODE REM PT RTRN 9FT ADLT (ELECTROSURGICAL) IMPLANT
EVACUATOR 1/8 PVC DRAIN (DRAIN) ×2 IMPLANT
FACESHIELD WRAPAROUND (MASK) ×10 IMPLANT
FACESHIELD WRAPAROUND OR TEAM (MASK) ×5 IMPLANT
GAUZE SPONGE 2X2 8PLY STRL LF (GAUZE/BANDAGES/DRESSINGS) IMPLANT
GLOVE BIOGEL PI IND STRL 6.5 (GLOVE) ×1 IMPLANT
GLOVE BIOGEL PI IND STRL 7.0 (GLOVE) IMPLANT
GLOVE BIOGEL PI IND STRL 8 (GLOVE) ×1 IMPLANT
GLOVE BIOGEL PI INDICATOR 6.5 (GLOVE) ×2
GLOVE BIOGEL PI INDICATOR 7.0 (GLOVE) ×2
GLOVE BIOGEL PI INDICATOR 8 (GLOVE) ×1
GLOVE ECLIPSE 8.0 STRL XLNG CF (GLOVE) ×4 IMPLANT
GLOVE SURG SS PI 6.5 STRL IVOR (GLOVE) ×3 IMPLANT
GOWN STRL REUS W/TWL LRG LVL3 (GOWN DISPOSABLE) ×2 IMPLANT
GOWN STRL REUS W/TWL XL LVL3 (GOWN DISPOSABLE) ×4 IMPLANT
HANDPIECE INTERPULSE COAX TIP (DISPOSABLE) ×2
IMMOBILIZER KNEE 20 (SOFTGOODS) ×4 IMPLANT
IMMOBILIZER KNEE 20 THIGH 36 (SOFTGOODS) ×1 IMPLANT
KIT BASIN OR (CUSTOM PROCEDURE TRAY) ×2 IMPLANT
MANIFOLD NEPTUNE II (INSTRUMENTS) ×2 IMPLANT
NEEDLE HYPO 22GX1.5 SAFETY (NEEDLE) ×2 IMPLANT
NS IRRIG 1000ML POUR BTL (IV SOLUTION) IMPLANT
PACK TOTAL JOINT (CUSTOM PROCEDURE TRAY) ×2 IMPLANT
PADDING CAST COTTON 6X4 STRL (CAST SUPPLIES) IMPLANT
POSITIONER SURGICAL ARM (MISCELLANEOUS) ×2 IMPLANT
SET HNDPC FAN SPRY TIP SCT (DISPOSABLE) ×1 IMPLANT
SET PAD KNEE POSITIONER (MISCELLANEOUS) ×2 IMPLANT
SPONGE GAUZE 2X2 STER 10/PKG (GAUZE/BANDAGES/DRESSINGS) ×1
SPONGE LAP 18X18 X RAY DECT (DISPOSABLE) IMPLANT
SPONGE SURGIFOAM ABS GEL 100 (HEMOSTASIS) ×2 IMPLANT
STAPLER VISISTAT 35W (STAPLE) IMPLANT
SUCTION FRAZIER 12FR DISP (SUCTIONS) ×2 IMPLANT
SUT BONE WAX W31G (SUTURE) ×1 IMPLANT
SUT MNCRL AB 4-0 PS2 18 (SUTURE) ×2 IMPLANT
SUT VIC AB 1 CT1 27 (SUTURE) ×4
SUT VIC AB 1 CT1 27XBRD ANTBC (SUTURE) ×2 IMPLANT
SUT VIC AB 2-0 CT1 27 (SUTURE) ×6
SUT VIC AB 2-0 CT1 TAPERPNT 27 (SUTURE) ×3 IMPLANT
SUT VLOC 180 0 24IN GS25 (SUTURE) ×2 IMPLANT
SYRINGE 20CC LL (MISCELLANEOUS) ×2 IMPLANT
TOWEL OR 17X26 10 PK STRL BLUE (TOWEL DISPOSABLE) ×3 IMPLANT
TOWEL OR NON WOVEN STRL DISP B (DISPOSABLE) ×1 IMPLANT
TOWER CARTRIDGE SMART MIX (DISPOSABLE) ×2 IMPLANT
TRAY FOLEY CATH 14FRSI W/METER (CATHETERS) ×2 IMPLANT
WATER STERILE IRR 1500ML POUR (IV SOLUTION) ×2 IMPLANT
WRAP KNEE MAXI GEL POST OP (GAUZE/BANDAGES/DRESSINGS) ×2 IMPLANT

## 2014-05-15 NOTE — Brief Op Note (Signed)
05/15/2014  3:10 PM  PATIENT:  [name]  63 y.o. female  PRE-OPERATIVE DIAGNOSIS:  OA RIGHT KNEE and Morbid Obesity  POST-OPERATIVE DIAGNOSIS:  OA RIGHT KNEE and Morbid Obesity  PROCEDURE:  Procedure(s): RIGHT TOTAL KNEE ARTHROPLASTY (Right)  SURGEON:  Surgeon(s) and Role:    * Tobi Bastos, MD - Primary  PHYSICIAN ASSISTANT:Amber Attleboro PA   ASSISTANTS:Amber Ruth PA   ANESTHESIA:   general  EBL:  Total I/O In: 2000 [I.V.:2000] Out: 100 [Blood:100]  BLOOD ADMINISTERED:none  DRAINS: (One) Hemovact drain(s) in the Right Knee with  Suction Open   LOCAL MEDICATIONS USED:  BUPIVICAINE 20cc mixed with 20cc of Normal Saline   SPECIMEN:  No Specimen  DISPOSITION OF SPECIMEN:  N/A  COUNTS:  YES  TOURNIQUET:  * Missing tourniquet times found for documented tourniquets in log:  [id number] *  DICTATION: .Other Dictation: Dictation Number [id number]  PLAN OF CARE: Admit to inpatient   PATIENT DISPOSITION:  Stable in OR   Delay start of Pharmacological VTE agent (>24hrs) due to surgical blood loss or risk of bleeding: yes

## 2014-05-15 NOTE — Op Note (Signed)
NAMEMONETTA, LICK NO.:  [id number]  MEDICAL RECORD NO.:  [id number]  LOCATION:  7622                         FACILITY:  [facility]  PHYSICIAN:  Kipp Brood. Kendre Jacinto, M.D.DATE OF BIRTH:  1951/02/02  DATE OF PROCEDURE:  05/15/2014 DATE OF DISCHARGE:                              OPERATIVE REPORT   SURGEON:  Kipp Brood. Gladstone Lighter, MD  ASSISTANT:  Ardeen Jourdain, PA  PREOPERATIVE DIAGNOSES: 1. Morbid obesity. 2. Severe bone-on-bone degenerative arthritis with the flexion     contracture on the right knee.  POSTOPERATIVE DIAGNOSES: 1. Morbid obesity. 2. Severe bone-on-bone degenerative arthritis with the flexion     contracture on the right knee.  OPERATION: 1. Release of contractures, right knee. 2. Right total knee arthroplasty utilizing DePuy system.  I cemented     all 3 components.  The sizes used was size 3 right femoral     component posterior cruciate sacrificing-type, tibial tray is a     size 2.5, REVISION TRAY, size 35 patella with 3 pegs. 3. A size 3, 17.5 mm thickness rotating platform. 4. I utilized the bone graft in the proximal tibia cyst.  DESCRIPTION OF PROCEDURE:  Under general anesthesia, routine orthopedic prep and draping of the right lower extremity was carried out. Appropriate time-out was first carried out.  I also marked the appropriate right leg in the holding area.  At this time, the leg was exsanguinated and Esmarch tourniquet was elevated at 350 mmHg.  The knee was placed in a [facility] knee holder, was flexed.  Anterior approach to the knee was carried out in usual fashion.  Two flaps were created.  I reflected the patella laterally and then excised the anterior and posterior cruciate ligaments.  I also did medial and lateral meniscectomies and removed  several bone spurs.  At this time, initial drill holes were made in the intercondylar notch of the femur.  I then utilized the guide rod up the femoral canal.  I then removed the  guide rod, irrigated the canal.  We measured the distal femur to be a size 3 right.  We then cut this distal femur for a size 3 right utilizing anterior-posterior and chamfering cuts.  After that, we prepared the tibia in usual fashion and knee was quite tight.  She was extremely obese and the contracture was severe.  At this time, we then measured the tibia to be a size 2.5.  The tibia then was cut in usual fashion. The 6 mm thickness was removed from the medial plateau.  We noted then, there was a large cyst down into the tibial plateau medially, we first curetted that out and removed a large membrane.  The walls of the cyst was extremely hard.  We then decided at this point to use a reconstruction-type tibial component.  The tibia was prepared.  We then had to remove several more mm of bone from the tibial plateau.  We now had a good stable fit with the spacer blocks.  We had good stability. We utilized a 17.5 block.  Following that, after preparing our cuts in the proximal tibia, we then inserted our trial components after we  finished preparing the femur by making our notch cut.  We inserted our trial components,  put the knee in extension and did a resurfacing procedure on the patella for a size 5 patella with 3 pegs.  We then removed all trial components, water picked out the knee, cleaned the knee out that had the bone graft, the tibial plateau utilizing the bone from the chamfering cut.  Once this was done, we then cemented all 3 components in simultaneously.  We held the knee in extension.  We utilized a 17.5 mm spacer block.  Following that, we removed the trial component, searched for loose pieces of cement, removed those, thoroughly water picked out the knee.  Then, I inserted some thrombin- soaked Gelfoam posteriorly.  I then inserted my permanent 17.5, size 3 rotating platform and then reduced the knee and had good stability, good motion.  After this was completed, I then  went on and inserted a Hemovac drain and closed the knee in layers in the usual fashion.  The patient did have 2 g of IV Ancef, preop.          ______________________________ Kipp Brood. Gladstone Lighter, M.D.     RAG/MEDQ  D:  05/15/2014  T:  05/15/2014  Job:  [id number]

## 2014-05-15 NOTE — Plan of Care (Signed)
Problem: Consults Goal: Diagnosis- Total Joint Replacement Right total knee     

## 2014-05-15 NOTE — Transfer of Care (Signed)
Immediate Anesthesia Transfer of Care Note  Patient: [name]  Procedure(s) Performed: Procedure(s): RIGHT TOTAL KNEE ARTHROPLASTY (Right)  Patient Location: PACU  Anesthesia Type:General  Level of Consciousness: sedated  Airway & Oxygen Therapy: Patient Spontanous Breathing and Patient connected to face mask oxygen  Post-op Assessment: Report given to PACU RN and Post -op Vital signs reviewed and stable  Post vital signs: Reviewed and stable  Complications: No apparent anesthesia complications

## 2014-05-15 NOTE — Anesthesia Preprocedure Evaluation (Signed)
Anesthesia Evaluation  Patient identified by MRN, date of birth, ID band Patient awake    Reviewed: Allergy & Precautions, H&P , NPO status , Patient's Chart, lab work & pertinent test results  Airway Mallampati: III TM Distance: <3 FB Neck ROM: Full    Dental no notable dental hx.    Pulmonary neg pulmonary ROS,  breath sounds clear to auscultation  Pulmonary exam normal       Cardiovascular hypertension, Pt. on medications Rhythm:Regular Rate:Normal     Neuro/Psych negative neurological ROS  negative psych ROS   GI/Hepatic negative GI ROS, Neg liver ROS,   Endo/Other  negative endocrine ROSdiabetesMorbid obesity  Renal/GU negative Renal ROS  negative genitourinary   Musculoskeletal negative musculoskeletal ROS (+)   Abdominal   Peds negative pediatric ROS (+)  Hematology  (+) anemia ,   Anesthesia Other Findings   Reproductive/Obstetrics negative OB ROS                           Anesthesia Physical Anesthesia Plan  ASA: III  Anesthesia Plan: General   Post-op Pain Management:    Induction: Intravenous  Airway Management Planned: Oral ETT  Additional Equipment:   Intra-op Plan:   Post-operative Plan: Extubation in OR  Informed Consent: I have reviewed the patients History and Physical, chart, labs and discussed the procedure including the risks, benefits and alternatives for the proposed anesthesia with the patient or authorized representative who has indicated his/her understanding and acceptance.   Dental advisory given  Plan Discussed with: CRNA and Surgeon  Anesthesia Plan Comments:         Anesthesia Quick Evaluation

## 2014-05-15 NOTE — Anesthesia Postprocedure Evaluation (Signed)
Anesthesia Post Note  Patient: [name]  Procedure(s) Performed: Procedure(s) (LRB): RIGHT TOTAL KNEE ARTHROPLASTY (Right)  Anesthesia type: General  Patient location: PACU  Post pain: Pain level controlled  Post assessment: Post-op Vital signs reviewed  Last Vitals: BP 136/68  Pulse 98  Temp(Src) 36.4 C (Oral)  Resp 14  Ht 5\' 3"  (1.6 m)  Wt 234 lb (106.142 kg)  BMI 41.46 kg/m2  SpO2 94%  Post vital signs: Reviewed  Level of consciousness: sedated  Complications: No apparent anesthesia complications

## 2014-05-15 NOTE — Interval H&P Note (Signed)
History and Physical Interval Note:  05/15/2014 12:35 PM  [name]  has presented today for surgery, with the diagnosis of OA RIGHT KNEE  The various methods of treatment have been discussed with the patient and family. After consideration of risks, benefits and other options for treatment, the patient has consented to  Procedure(s): RIGHT TOTAL KNEE ARTHROPLASTY (Right) as a surgical intervention .  The patient's history has been reviewed, patient examined, no change in status, stable for surgery.  I have reviewed the patient's chart and labs.  Questions were answered to the patient's satisfaction.     Jovahn Breit A

## 2014-05-16 LAB — CBC
HEMATOCRIT: 31.2 % — AB (ref 36.0–46.0)
Hemoglobin: 9.7 g/dL — ABNORMAL LOW (ref 12.0–15.0)
MCH: 22.1 pg — ABNORMAL LOW (ref 26.0–34.0)
MCHC: 31.1 g/dL (ref 30.0–36.0)
MCV: 71.2 fL — ABNORMAL LOW (ref 78.0–100.0)
Platelets: 229 10*3/uL (ref 150–400)
RBC: 4.38 MIL/uL (ref 3.87–5.11)
RDW: 18.9 % — ABNORMAL HIGH (ref 11.5–15.5)
WBC: 8.4 10*3/uL (ref 4.0–10.5)

## 2014-05-16 LAB — BASIC METABOLIC PANEL
Anion gap: 11 (ref 5–15)
BUN: 29 mg/dL — ABNORMAL HIGH (ref 6–23)
CHLORIDE: 99 meq/L (ref 96–112)
CO2: 26 mEq/L (ref 19–32)
CREATININE: 1.47 mg/dL — AB (ref 0.50–1.10)
Calcium: 9 mg/dL (ref 8.4–10.5)
GFR calc Af Amer: 42 mL/min — ABNORMAL LOW (ref >90)
GFR calc non Af Amer: 36 mL/min — ABNORMAL LOW (ref >90)
Glucose, Bld: 155 mg/dL — ABNORMAL HIGH (ref 70–99)
Potassium: 4.4 mEq/L (ref 3.7–5.3)
Sodium: 136 mEq/L — ABNORMAL LOW (ref 137–147)

## 2014-05-16 LAB — GLUCOSE, CAPILLARY
Glucose-Capillary: 215 mg/dL — ABNORMAL HIGH (ref 70–99)
Glucose-Capillary: 139 mg/dL — ABNORMAL HIGH (ref 70–99)
Glucose-Capillary: 144 mg/dL — ABNORMAL HIGH (ref 70–99)
Glucose-Capillary: 116 mg/dL — ABNORMAL HIGH (ref 70–99)

## 2014-05-16 MED ORDER — LIP MEDEX EX OINT
TOPICAL_OINTMENT | CUTANEOUS | Status: AC
  Filled 2014-05-16: qty 7

## 2014-05-16 NOTE — Progress Notes (Signed)
Physical Therapy Treatment Patient Details Name: [name] MRN: [id number] DOB: Jul 10, 1951 Today's Date: 05/16/2014    History of Present Illness s/p R TKA    PT Comments    Pt s/p R TKR presents with decreased R LE strength/ROM, post op pain and obesity limiting functional mobility.  Pt would benefit from follow up rehab at SNF level to maximize IND and safety prior to return home with limited assist.  Follow Up Recommendations  SNF     Equipment Recommendations       Recommendations for Other Services OT consult     Precautions / Restrictions Precautions Precautions: Knee Required Braces or Orthoses: Knee Immobilizer - Right Knee Immobilizer - Right: Discontinue once straight leg raise with < 10 degree lag Restrictions Weight Bearing Restrictions: No Other Position/Activity Restrictions: WBAT    Mobility  Bed Mobility Overal bed mobility: Needs Assistance Bed Mobility: Supine to Sit     Supine to sit: Min assist     General bed mobility comments: cues for sequence and use of L LE to self assist  Transfers Overall transfer level: Needs assistance Equipment used: Rolling walker (2 wheeled) Transfers: Sit to/from Stand Sit to Stand: Min assist         General transfer comment: assist to rise and steady  Ambulation/Gait Ambulation/Gait assistance: Min assist Ambulation Distance (Feet): 27 Feet Assistive device: Rolling walker (2 wheeled) Gait Pattern/deviations: Step-to pattern;Decreased step length - right;Decreased step length - left;Shuffle;Trunk flexed Gait velocity: decre   General Gait Details: cues for sequence, posture and position from [name]            Wheelchair Mobility    Modified Rankin (Stroke Patients Only)       Balance                                    Cognition Arousal/Alertness: Awake/alert Behavior During Therapy: WFL for tasks assessed/performed Overall Cognitive Status: Within Functional  Limits for tasks assessed                      Exercises Total Joint Exercises Ankle Circles/Pumps: AROM;Both;10 reps;Supine Quad Sets: AROM;Both;10 reps;Supine Heel Slides: AAROM;Right;10 reps;Supine Straight Leg Raises: AAROM;Right;10 reps;Supine    General Comments        Pertinent Vitals/Pain Pain Assessment: 0-10 Pain Score: 6  Pain Location: R knee Pain Descriptors / Indicators: Sore Pain Intervention(s): Premedicated before session;Ice applied;Limited activity within patient's tolerance    Home Living Family/patient expects to be discharged to:: Unsure Living Arrangements: Spouse/significant other             Additional Comments: wants to go to rehab.  Has a comfort height commode and tub/shower    Prior Function Level of Independence: Independent          PT Goals (current goals can now be found in the care plan section) Acute Rehab PT Goals Patient Stated Goal: go to rehab then home PT Goal Formulation: With patient Time For Goal Achievement: 05/23/14 Potential to Achieve Goals: Good    Frequency  7X/week    PT Plan      Co-evaluation             End of Session Equipment Utilized During Treatment: Gait belt;Right knee immobilizer Activity Tolerance: Patient tolerated treatment well Patient left: in chair;with call bell/phone within reach     Time: 0912-0940 PT Time  Calculation (min): 28 min  Charges:  $Gait Training: 8-22 mins $Therapeutic Exercise: 8-22 mins                    G Codes:      [name] 06-12-2014, 12:08 PM

## 2014-05-16 NOTE — Evaluation (Signed)
Occupational Therapy Evaluation Patient Details Name: [name] MRN: [id number] DOB: [birth year] Today's Date: 05/16/2014    History of Present Illness s/p R TKA   Clinical Impression   This 63 year old female was admitted for the above surgery.  She will benefit from skilled OT to increase safety and independence with adls.  Goals are for overall min guard to min A.    Follow Up Recommendations  SNF    Equipment Recommendations  3 in 1 bedside comode    Recommendations for Other Services       Precautions / Restrictions Precautions Precautions: Knee Required Braces or Orthoses: Knee Immobilizer - Right Restrictions Weight Bearing Restrictions: No      Mobility Bed Mobility                  Transfers Overall transfer level: Needs assistance Equipment used: Rolling walker (2 wheeled) Transfers: Sit to/from Stand Sit to Stand: Min assist         General transfer comment: assist to rise and steady    Balance                                            ADL Overall ADL's : Needs assistance/impaired     Grooming: Set up;Sitting   Upper Body Bathing: Set up;Sitting   Lower Body Bathing: Moderate assistance;Sit to/from stand   Upper Body Dressing : Sitting;Minimal assistance (iv)   Lower Body Dressing: Moderate assistance;Sit to/from stand                 General ADL Comments: Completed ADL from recliner.   Pt had one LOB when standing for ADLs--assist to lower to chair.  Educated on Secondary school teacher and sock aide--she has borrowed the sock aide  Educated on tub bench and tub readiness.  Pt feels she will sponge bathe until she can step in to tub.  Dyspnea 2/4 after ADL.  She did not feel she had to urinate yet.  She has tried to sit on commode earlier this am.       Vision                     Perception     Praxis      Pertinent Vitals/Pain Pain Assessment: 0-10 Pain Score: 6  Pain Location: R knee Pain Descriptors  / Indicators: Sore Pain Intervention(s): Premedicated before session;Ice applied;Limited activity within patient's tolerance     Hand Dominance     Extremity/Trunk Assessment Upper Extremity Assessment Upper Extremity Assessment: Overall WFL for tasks assessed           Communication Communication Communication: No difficulties   Cognition Arousal/Alertness: Awake/alert Behavior During Therapy: WFL for tasks assessed/performed Overall Cognitive Status: Within Functional Limits for tasks assessed                     General Comments       Exercises       Shoulder Instructions      Home Living Family/patient expects to be discharged to:: Unsure                                 Additional Comments: wants to go to rehab.  Has a comfort height commode and tub/shower  Prior Functioning/Environment Level of Independence: Independent             OT Diagnosis: Generalized weakness   OT Problem List: Decreased strength;Decreased activity tolerance;Impaired balance (sitting and/or standing);Pain;Decreased knowledge of use of DME or AE;Decreased knowledge of precautions   OT Treatment/Interventions: Self-care/ADL training;DME and/or AE instruction;Patient/family education;Balance training    OT Goals(Current goals can be found in the care plan section) Acute Rehab OT Goals Patient Stated Goal: go to rehab then home OT Goal Formulation: With patient Time For Goal Achievement: 05/23/14 Potential to Achieve Goals: Good ADL Goals Pt Will Perform Grooming: with min guard assist;standing Pt Will Perform Lower Body Bathing: with min guard assist;sit to/from stand;with adaptive equipment Pt Will Perform Lower Body Dressing: with min assist;with adaptive equipment;sit to/from stand Pt Will Transfer to Toilet: with min guard assist;bedside commode;ambulating Pt Will Perform Toileting - Clothing Manipulation and hygiene: with min guard assist;sit to/from  stand  OT Frequency: Min 2X/week   Barriers to D/C:            Co-evaluation              End of Session    Activity Tolerance: Patient tolerated treatment well Patient left: in chair;with call bell/phone within reach   Time: 0947-1018 OT Time Calculation (min): 31 min Charges:  OT General Charges $OT Visit: 1 Procedure OT Evaluation $Initial OT Evaluation Tier I: 1 Procedure OT Treatments $Self Care/Home Management : 23-37 mins G-Codes:    Marquavis Hannen 02-Jun-2014, 10:27 AM  Lesle Chris, OTR/L [phone] Jun 02, 2014

## 2014-05-16 NOTE — Discharge Instructions (Addendum)
Information on my medicine - XARELTO (Rivaroxaban)  This medication education was reviewed with me or my healthcare representative as part of my discharge preparation.  The pharmacist that spoke with me during my hospital stay was:  Kara Mead, [facility]  Why was Xarelto prescribed for you? Xarelto was prescribed for you to reduce the risk of blood clots forming after orthopedic surgery. The medical term for these abnormal blood clots is venous thromboembolism (VTE).  What do you need to know about xarelto ? Take your Xarelto ONCE DAILY at the same time every day. You may take it either with or without food.  If you have difficulty swallowing the tablet whole, you may crush it and mix in applesauce just prior to taking your dose.  Take Xarelto exactly as prescribed by your doctor and DO NOT stop taking Xarelto without talking to the doctor who prescribed the medication.  Stopping without other VTE prevention medication to take the place of Xarelto may increase your risk of developing a clot.  After discharge, you should have regular check-up appointments with your healthcare provider that is prescribing your Xarelto.    What do you do if you miss a dose? If you miss a dose, take it as soon as you remember on the same day then continue your regularly scheduled once daily regimen the next day. Do not take two doses of Xarelto on the same day.   Important Safety Information A possible side effect of Xarelto is bleeding. You should call your healthcare provider right away if you experience any of the following:   Bleeding from an injury or your nose that does not stop.   Unusual colored urine (red or dark brown) or unusual colored stools (red or black).   Unusual bruising for unknown reasons.   A serious fall or if you hit your head (even if there is no bleeding).  Some medicines may interact with Xarelto and might increase your risk of bleeding while on Xarelto. To help  avoid this, consult your healthcare provider or pharmacist prior to using any new prescription or non-prescription medications, including herbals, vitamins, non-steroidal anti-inflammatory drugs (NSAIDs) and supplements.  This website has more information on Xarelto: [email or web address]    Walk with your walker. Weight bearing as tolerated. Do not change the dressing over the incision unless there is excess drainage Shower only, no tub bath. Discontinue use of vitamins and supplements (except iron) until completion of three week course of Xarelto Call if any temperatures greater than 101 or any wound complications: [phone] during the day and ask for Dr. Charlestine Night nurse, Brunilda Payor.

## 2014-05-16 NOTE — Progress Notes (Signed)
   Subjective: 1 Day Post-Op Procedure(s) (LRB): RIGHT TOTAL KNEE ARTHROPLASTY (Right) Patient reports pain as mild.   Patient seen in rounds without Dr. Gladstone Lighter. Patient is well, and has had no acute complaints or problems. She reports that she was able to get a little sleep last night. She reports the urge to void. Sitting on bedside commode during rounds. Reports motivation to work with therapy. No issues overnight. No SOB or chest pain. Plan is to go Home after hospital stay.  Objective: Vital signs in last 24 hours: Temp:  [96.6 F (35.9 C)-98.4 F (36.9 C)] 97.2 F (36.2 C) (08/19 0503) Pulse Rate:  [88-130] 90 (08/19 0503) Resp:  [12-20] 18 (08/19 0503) BP: (120-164)/(67-88) 151/86 mmHg (08/19 0503) SpO2:  [18 %-98 %] 90 % (08/19 0503) Weight:  [106.142 kg (234 lb)] 106.142 kg (234 lb) (08/18 1755)  Intake/Output from previous day:  Intake/Output Summary (Last 24 hours) at 05/16/14 0712 Last data filed at 05/16/14 0646  Gross per 24 hour  Intake   4440 ml  Output    630 ml  Net   3810 ml     Labs:  Recent Labs  05/16/14 0416  HGB 9.7*    Recent Labs  05/16/14 0416  WBC 8.4  RBC 4.38  HCT 31.2*  PLT 229    Recent Labs  05/16/14 0416  NA 136*  K 4.4  CL 99  CO2 26  BUN 29*  CREATININE 1.47*  GLUCOSE 155*  CALCIUM 9.0    EXAM General - Patient is Alert and Oriented Extremity - Neurologically intact Intact pulses distally Dorsiflexion/Plantar flexion intact Compartment soft Dressing - dressing C/D/I Motor Function - intact, moving foot and toes well on exam.  Hemovac pulled without difficulty.  Past Medical History  Diagnosis Date  . Bradycardia   . Iron deficiency anemia   . Hiatal hernia     large  . Diabetes mellitus     type 2  . Morbid obesity   . GERD (gastroesophageal reflux disease)   . Shortness of breath     with exertion  . Sinus drainage   . Breast cancer 03/16/12    right lumpectomy=high grade ductal ca in  situ,ER/PR=positive  . Cancer     right breast ductal carcinoma in situ  . Hypertension     Echocardiogram 03/2011: EF 02-63%, grade 1 diastolic dysfunction, mild MAC, mild LAE  . Blood transfusion 03/14/12  . S/P radiation therapy 05/16/12 - 06/29/12    Right Breast: 50 Gray/ 25 Fractions with Boost: 10 Gray/ 5 Fractions  . Use of anastrozole (Arimidex) Started 10/13  . Chronic kidney disease     stage III   . Arthritis     Assessment/Plan: 1 Day Post-Op Procedure(s) (LRB): RIGHT TOTAL KNEE ARTHROPLASTY (Right) Active Problems:   Morbid obesity   Osteoarthritis of right knee   Total knee replacement status  Estimated body mass index is 41.46 kg/(m^2) as calculated from the following:   Height as of this encounter: 5\' 3"  (1.6 m).   Weight as of this encounter: 106.142 kg (234 lb). Advance diet Up with therapy D/C IV fluids when tolerating POs well  DVT Prophylaxis - Xarelto Weight-Bearing as tolerated    She is doing well this morning. Catheter DC'd. PT today. Possible DC home tomorrow.   Ardeen Jourdain, PA-C Orthopaedic Surgery 05/16/2014, 7:12 AM

## 2014-05-16 NOTE — Progress Notes (Signed)
Clinical Social Work Department CLINICAL SOCIAL WORK PLACEMENT NOTE 05/16/2014  Patient:  [name], [name]  Account Number:  [id number] Admit date:  05/15/2014  Clinical Social Worker:  Werner Lean, LCSW  Date/time:  05/16/2014 12:03 PM  Clinical Social Work is seeking post-discharge placement for this patient at the following level of care:   SKILLED NURSING   (*CSW will update this form in Epic as items are completed)     Patient/family provided with [facility] Department of Clinical Social Work's list of facilities offering this level of care within the geographic area requested by the patient (or if unable, by the patient's family).  05/16/2014  Patient/family informed of their freedom to choose among providers that offer the needed level of care, that participate in Medicare, Medicaid or managed care program needed by the patient, have an available bed and are willing to accept the patient.    Patient/family informed of [facility]' ownership interest in [facility], as well as of the fact that they are under no obligation to receive care at this facility.  PASARR submitted to EDS on 05/16/2014 PASARR number received on 05/16/2014  FL2 transmitted to all facilities in geographic area requested by pt/family on  05/16/2014 FL2 transmitted to all facilities within larger geographic area on   Patient informed that his/her managed care company has contracts with or will negotiate with  certain facilities, including the following:     Patient/family informed of bed offers received:   Patient chooses bed at  Physician recommends and patient chooses bed at    Patient to be transferred to  on   Patient to be transferred to facility by  Patient and family notified of transfer on  Name of family member notified:    The following physician request were entered in Epic:   Additional Comments:  Werner Lean LCSW [phone]

## 2014-05-16 NOTE — Progress Notes (Signed)
Pt has accepted SNF bed offer from [facility]. SNF is working with [employer] for prior authorization. CSW will assist as needed with authorization process.  Werner Lean LCSW [phone]

## 2014-05-16 NOTE — Progress Notes (Signed)
Physical Therapy Treatment Patient Details Name: [name] MRN: [id number] DOB: [birth year] Today's Date: 06/04/14    History of Present Illness s/p R TKA    PT Comments      Follow Up Recommendations  SNF     Equipment Recommendations  Rolling walker with 5" wheels    Recommendations for Other Services OT consult     Precautions / Restrictions Precautions Precautions: Knee Required Braces or Orthoses: Knee Immobilizer - Right Knee Immobilizer - Right: Discontinue once straight leg raise with < 10 degree lag Restrictions Weight Bearing Restrictions: No Other Position/Activity Restrictions: WBAT    Mobility  Bed Mobility Overal bed mobility: Needs Assistance Bed Mobility: Supine to Sit;Sit to Supine     Supine to sit: Min assist Sit to supine: Min assist   General bed mobility comments: cues for sequence and use of L LE to self assist  Transfers Overall transfer level: Needs assistance Equipment used: Rolling walker (2 wheeled) Transfers: Sit to/from Stand Sit to Stand: Min assist         General transfer comment: cues for LE management and use of UEs to self assist  Ambulation/Gait Ambulation/Gait assistance: Min assist Ambulation Distance (Feet): 58 Feet (and 19' twice to/from bathroom) Assistive device: Rolling walker (2 wheeled) Gait Pattern/deviations: Step-to pattern;Decreased step length - right;Decreased step length - left;Shuffle;Trunk flexed Gait velocity: decre   General Gait Details: cues for sequence, posture and position from [name]            Wheelchair Mobility    Modified Rankin (Stroke Patients Only)       Balance                                    Cognition Arousal/Alertness: Awake/alert Behavior During Therapy: WFL for tasks assessed/performed Overall Cognitive Status: Within Functional Limits for tasks assessed                      Exercises      General Comments         Pertinent Vitals/Pain Pain Assessment: 0-10 Pain Score: 6  Pain Location: R knee Pain Descriptors / Indicators: Aching;Sore Pain Intervention(s): Limited activity within patient's tolerance;Monitored during session;Premedicated before session;Ice applied    Home Living                      Prior Function            PT Goals (current goals can now be found in the care plan section) Acute Rehab PT Goals Patient Stated Goal: go to rehab then home PT Goal Formulation: With patient Time For Goal Achievement: 05/23/14 Potential to Achieve Goals: Good Progress towards PT goals: Progressing toward goals    Frequency  7X/week    PT Plan Current plan remains appropriate    Co-evaluation             End of Session Equipment Utilized During Treatment: Gait belt;Right knee immobilizer Activity Tolerance: Patient tolerated treatment well Patient left: in bed;with call bell/phone within reach     Time: 4098-1191 PT Time Calculation (min): 40 min  Charges:  $Gait Training: 23-37 mins $Therapeutic Activity: 8-22 mins                    G Codes:      [name] 2014-06-04, 4:52 PM

## 2014-05-16 NOTE — Progress Notes (Signed)
PHARMACIST - PHYSICIAN COMMUNICATION DR:  Gladstone Lighter CONCERNING:  METFORMIN SAFE ADMINISTRATION POLICY  RECOMMENDATION: Metformin has been placed on DISCONTINUE (rejected order) STATUS and should be reordered only after any of the conditions below are ruled out.  Current safety recommendations include avoiding metformin for a minimum of 48 hours after the patient's exposure to intravenous contrast media.  DESCRIPTION:  The Pharmacy Committee has adopted a policy that restricts the use of metformin in hospitalized patients until all the contraindications to administration have been ruled out. Specific contraindications are: []  Serum creatinine ? 1.5 for males [x]  Serum creatinine ? 1.4 for females []  Shock, acute MI, sepsis, hypoxemia, dehydration []  Planned administration of intravenous iodinated contrast media []  Heart Failure patients with low EF []  Acute or chronic metabolic acidosis (including DKA)     Adrian Saran, PharmD, BCPS Pager [phone] 05/16/2014 1:21 PM

## 2014-05-16 NOTE — Progress Notes (Signed)
Clinical Social Work Department BRIEF PSYCHOSOCIAL ASSESSMENT 05/16/2014  Patient:  [name], [name]     Account Number:  [id number]     Admit date:  05/15/2014  Clinical Social Worker:  Lacie Scotts  Date/Time:  05/16/2014 11:51 AM  Referred by:  Physician  Date Referred:  05/16/2014 Referred for  SNF Placement   Other Referral:   Interview type:  Patient Other interview type:    PSYCHOSOCIAL DATA Living Status:  HUSBAND Admitted from facility:   Level of care:   Primary support name:  [name] Primary support relationship to patient:  SPOUSE Degree of support available:   unclear    CURRENT CONCERNS Current Concerns  Post-Acute Placement   Other Concerns:    SOCIAL WORK ASSESSMENT / PLAN Pt is a 63 yr old female living at home prior to hospitalization. CSW met with pt to assist with d/c planning. Pt feels ST rehab is needed following hospital d/c. PT recommendations are pending. OT has recommended SNF placement. Pt would like to have [facility] Re[facility] at [facility]. SNF has been contacted and a decision is pending. Pt has BCBS [employer] which requires prior authorization. CSW will assist with this process.   Assessment/plan status:  Psychosocial Support/Ongoing Assessment of Needs Other assessment/ plan:   Information/referral to community resources:   Insurance coverage for SNF and ambulance reviewed. SNF list with offers will be provided if [facility] is unable to assist.    PATIENT'S/FAMILY'S RESPONSE TO PLAN OF CARE: Pt's mood is bright. She had a fair night. Now that surgery is completed she realizes she will need ST Rehab before going home. Pt is motivated to begin therapy.   Werner Lean LCSW [phone]

## 2014-05-17 LAB — BASIC METABOLIC PANEL
Anion gap: 12 (ref 5–15)
BUN: 35 mg/dL — ABNORMAL HIGH (ref 6–23)
CALCIUM: 9 mg/dL (ref 8.4–10.5)
CHLORIDE: 98 meq/L (ref 96–112)
CO2: 26 meq/L (ref 19–32)
Creatinine, Ser: 1.68 mg/dL — ABNORMAL HIGH (ref 0.50–1.10)
GFR calc Af Amer: 36 mL/min — ABNORMAL LOW (ref >90)
GFR calc non Af Amer: 31 mL/min — ABNORMAL LOW (ref >90)
Glucose, Bld: 140 mg/dL — ABNORMAL HIGH (ref 70–99)
Potassium: 4.1 mEq/L (ref 3.7–5.3)
Sodium: 136 mEq/L — ABNORMAL LOW (ref 137–147)

## 2014-05-17 LAB — CBC
HCT: 33.6 % — ABNORMAL LOW (ref 36.0–46.0)
HEMOGLOBIN: 10.1 g/dL — AB (ref 12.0–15.0)
MCH: 21.5 pg — ABNORMAL LOW (ref 26.0–34.0)
MCHC: 30.1 g/dL (ref 30.0–36.0)
MCV: 71.6 fL — AB (ref 78.0–100.0)
Platelets: 233 10*3/uL (ref 150–400)
RBC: 4.69 MIL/uL (ref 3.87–5.11)
RDW: 19.1 % — ABNORMAL HIGH (ref 11.5–15.5)
WBC: 9.2 10*3/uL (ref 4.0–10.5)

## 2014-05-17 LAB — GLUCOSE, CAPILLARY
GLUCOSE-CAPILLARY: 121 mg/dL — AB (ref 70–99)
Glucose-Capillary: 158 mg/dL — ABNORMAL HIGH (ref 70–99)
Glucose-Capillary: 122 mg/dL — ABNORMAL HIGH (ref 70–99)

## 2014-05-17 MED ORDER — HYDROCODONE-ACETAMINOPHEN 5-325 MG PO TABS: 1.0000 | ORAL_TABLET | ORAL | Status: DC | PRN

## 2014-05-17 MED ORDER — RIVAROXABAN 10 MG PO TABS: 10.0000 mg | ORAL_TABLET | Freq: Every day | ORAL | Status: DC

## 2014-05-17 MED ORDER — OXYCODONE-ACETAMINOPHEN 5-325 MG PO TABS: 1.0000 | ORAL_TABLET | ORAL | Status: DC | PRN

## 2014-05-17 MED ORDER — METHOCARBAMOL 500 MG PO TABS: 500.0000 mg | ORAL_TABLET | Freq: Four times a day (QID) | ORAL | Status: DC | PRN

## 2014-05-17 MED ORDER — DOCUSATE SODIUM 100 MG PO CAPS: 100.0000 mg | ORAL_CAPSULE | Freq: Two times a day (BID) | ORAL | Status: DC

## 2014-05-17 NOTE — Progress Notes (Signed)
Subjective: 2 Days Post-Op Procedure(s) (LRB): RIGHT TOTAL KNEE ARTHROPLASTY (Right) Patient reports pain as 1 on 0-10 scale. Doing very well. Ready for SNF.   Objective: Vital signs in last 24 hours: Temp:  [97.9 F (36.6 C)-99.1 F (37.3 C)] 98.7 F (37.1 C) (08/20 0652) Pulse Rate:  [92-111] 98 (08/20 0652) Resp:  [15-18] 18 (08/20 0652) BP: (98-143)/(55-82) 98/75 mmHg (08/20 0652) SpO2:  [83 %-96 %] 96 % (08/20 0652)  Intake/Output from previous day: 08/19 0701 - 08/20 0700 In: 1566.7 [P.O.:720; I.V.:846.7] Out: 1100 [Urine:1100] Intake/Output this shift:     Recent Labs  05/16/14 0416 05/17/14 0425  HGB 9.7* 10.1*    Recent Labs  05/16/14 0416 05/17/14 0425  WBC 8.4 9.2  RBC 4.38 4.69  HCT 31.2* 33.6*  PLT 229 233    Recent Labs  05/16/14 0416 05/17/14 0425  NA 136* 136*  K 4.4 4.1  CL 99 98  CO2 26 26  BUN 29* 35*  CREATININE 1.47* 1.68*  GLUCOSE 155* 140*  CALCIUM 9.0 9.0   No results found for this basename: LABPT, INR,  in the last 72 hours  Neurologically intact  Assessment/Plan: 2 Days Post-Op Procedure(s) (LRB): RIGHT TOTAL KNEE ARTHROPLASTY (Right) Discharge to SNF  Sonda Coppens A 05/17/2014, 7:21 AM

## 2014-05-17 NOTE — Care Management Note (Signed)
    Page 1 of 1   05/17/2014     2:06:34 PM CARE MANAGEMENT NOTE 05/17/2014  Patient:  KENDYL, FESTA   Account Number:  [id number]  Date Initiated:  05/16/2014  Documentation initiated by:  Gabriel Earing  Subjective/Objective Assessment:   pt admitted for knee surgery     Action/Plan:   from home   Anticipated DC Date:  05/19/2014   Anticipated DC Plan:  [place]  CM consult      Choice offered to / List presented to:             Status of service:  Completed, signed off Medicare Important Message given?  NA - LOS <3 / Initial given by admissions (If response is "NO", the following Medicare IM given date fields will be blank) Date Medicare IM given:   Medicare IM given by:   Date Additional Medicare IM given:   Additional Medicare IM given by:    Discharge Disposition:  [place]  Per UR Regulation:  Reviewed for med. necessity/level of care/duration of stay  If discussed at Long Length of Stay Meetings, dates discussed:    Comments:  8/20/15CSW made this CM aware pt dc to SNF.  No other CM needs were communicated.  Mariane Masters, BSN, [place]. 05/16/14 MMcGibboney, RN, BSN Chart reviewed.

## 2014-05-17 NOTE — Progress Notes (Signed)
Clinical Social Work Department CLINICAL SOCIAL WORK PLACEMENT NOTE 05/17/2014  Patient:  [name], [name]  Account Number:  [id number] Admit date:  05/15/2014  Clinical Social Worker:  Werner Lean, LCSW  Date/time:  05/16/2014 12:03 PM  Clinical Social Work is seeking post-discharge placement for this patient at the following level of care:   SKILLED NURSING   (*CSW will update this form in Epic as items are completed)     Patient/family provided with [place] Department of Clinical Social Work's list of facilities offering this level of care within the geographic area requested by the patient (or if unable, by the patient's family).  05/16/2014  Patient/family informed of their freedom to choose among providers that offer the needed level of care, that participate in Medicare, Medicaid or managed care program needed by the patient, have an available bed and are willing to accept the patient.    Patient/family informed of [facility]' ownership interest in [facility], as well as of the fact that they are under no obligation to receive care at this facility.  PASARR submitted to EDS on 05/16/2014 PASARR number received on 05/16/2014  FL2 transmitted to all facilities in geographic area requested by pt/family on  05/16/2014 FL2 transmitted to all facilities within larger geographic area on   Patient informed that his/her managed care company has contracts with or will negotiate with  certain facilities, including the following:     Patient/family informed of bed offers received:  05/16/2014 Patient chooses bed at [facility] Physician recommends and patient chooses bed at    Patient to be transferred to [facility] on  05/17/2014 Patient to be transferred to facility by Spouse Patient and family notified of transfer on 05/17/2014 Name of family member notified:  Pt declined CSW assistance.  The following physician request were entered in Epic:   Additional  Comments: Pt is in agreement with d/c to SNF today family transport. BCBS provided authorization for SNF. NSG reviewed d/c summary, scripts, avs. Scripts are included in d/c packet.  Werner Lean LCSW [phone]

## 2014-05-17 NOTE — Progress Notes (Signed)
Physical Therapy Treatment Patient Details Name: [name] MRN: [id number] DOB: 03-07-1951 Today's Date: 05/26/2014    History of Present Illness s/p R TKA    PT Comments    Steady progress but with continued assist required for safe performance of all mobility tasks.  Follow Up Recommendations  SNF     Equipment Recommendations  Rolling walker with 5" wheels    Recommendations for Other Services OT consult     Precautions / Restrictions Precautions Precautions: Knee Required Braces or Orthoses: Knee Immobilizer - Right Knee Immobilizer - Right: Discontinue once straight leg raise with < 10 degree lag Restrictions Weight Bearing Restrictions: No Other Position/Activity Restrictions: WBAT    Mobility  Bed Mobility               General bed mobility comments: OOB with RN  Transfers Overall transfer level: Needs assistance Equipment used: Rolling walker (2 wheeled) Transfers: Sit to/from Stand Sit to Stand: Min assist         General transfer comment: cues for LE management and use of UEs to self assist  Ambulation/Gait Ambulation/Gait assistance: Min assist Ambulation Distance (Feet): 71 Feet Assistive device: Rolling walker (2 wheeled) Gait Pattern/deviations: Step-to pattern;Shuffle;Trunk flexed;Decreased step length - right;Decreased step length - left     General Gait Details: cues for posture and position from [name]            Wheelchair Mobility    Modified Rankin (Stroke Patients Only)       Balance                                    Cognition Arousal/Alertness: Awake/alert Behavior During Therapy: WFL for tasks assessed/performed Overall Cognitive Status: Within Functional Limits for tasks assessed                      Exercises Total Joint Exercises Ankle Circles/Pumps: AROM;Both;Supine;20 reps Quad Sets: AROM;Both;Supine;20 reps Heel Slides: AAROM;Right;Supine;20 reps Straight Leg  Raises: AAROM;Right;Supine;20 reps    General Comments        Pertinent Vitals/Pain Pain Assessment: 0-10 Pain Score: 7  Pain Location: R knee Pain Descriptors / Indicators: Aching;Tingling Pain Intervention(s): Limited activity within patient's tolerance;Monitored during session;Premedicated before session;Ice applied    Home Living                      Prior Function            PT Goals (current goals can now be found in the care plan section) Acute Rehab PT Goals Patient Stated Goal: go to rehab then home PT Goal Formulation: With patient Time For Goal Achievement: 05/23/14 Potential to Achieve Goals: Good Progress towards PT goals: Progressing toward goals    Frequency  7X/week    PT Plan Current plan remains appropriate    Co-evaluation             End of Session Equipment Utilized During Treatment: Gait belt;Right knee immobilizer Activity Tolerance: Patient tolerated treatment well Patient left: in chair;with call bell/phone within reach     Time: 0808-0840 PT Time Calculation (min): 32 min  Charges:  $Gait Training: 8-22 mins $Therapeutic Exercise: 8-22 mins                    G Codes:      [name] 2014/05/26, 8:40 AM

## 2014-05-17 NOTE — Discharge Summary (Signed)
Physician Discharge Summary   Patient ID: [name] MRN: [id number] DOB/AGE: Feb 24, 1951 63 y.o.  Admit date: 05/15/2014 Discharge date: 05/17/2014  Primary Diagnosis: Osteoarthritis, right knee   Admission Diagnoses:  Past Medical History  Diagnosis Date  . Bradycardia   . Iron deficiency anemia   . Hiatal hernia     large  . Diabetes mellitus     type 2  . Morbid obesity   . GERD (gastroesophageal reflux disease)   . Shortness of breath     with exertion  . Sinus drainage   . Breast cancer 03/16/12    right lumpectomy=high grade ductal ca in situ,ER/PR=positive  . Cancer     right breast ductal carcinoma in situ  . Hypertension     Echocardiogram 03/2011: EF 61-95%, grade 1 diastolic dysfunction, mild MAC, mild LAE  . Blood transfusion 03/14/12  . S/P radiation therapy 05/16/12 - 06/29/12    Right Breast: 50 Gray/ 25 Fractions with Boost: 10 Gray/ 5 Fractions  . Use of anastrozole (Arimidex) Started 10/13  . Chronic kidney disease     stage III   . Arthritis    Discharge Diagnoses:   Active Problems:   Morbid obesity   Osteoarthritis of right knee   Total knee replacement status  Estimated body mass index is 41.46 kg/(m^2) as calculated from the following:   Height as of this encounter: 5\' 3"  (1.6 m).   Weight as of this encounter: 106.142 kg (234 lb).  Procedure:  Procedure(s) (LRB): RIGHT TOTAL KNEE ARTHROPLASTY (Right)   Consults: None  HPI: [name], 63 y.o. female, has a history of pain and functional disability in the right knee due to arthritis and has failed non-surgical conservative treatments for greater than 12 weeks to includeNSAID's and/or analgesics, corticosteriod injections, flexibility and strengthening excercises and activity modification. Onset of symptoms was gradual, starting 5 years ago with gradually worsening course since that time. The patient noted no past surgery on the right knee(s). Patient currently rates pain in the right knee(s)  at 7 out of 10 with activity. Patient has night pain, worsening of pain with activity and weight bearing, pain that interferes with activities of daily living, pain with passive range of motion, crepitus and joint swelling. Patient has evidence of periarticular osteophytes and joint space narrowing by imaging studies. There is no active infection.   Laboratory Data: Admission on 05/15/2014  Component Date Value Ref Range Status  . Glucose-Capillary 05/15/2014 131* 70 - 99 mg/dL Final  . Comment 1 05/15/2014 Notify RN   Final  . Glucose-Capillary 05/15/2014 162* 70 - 99 mg/dL Final  . Comment 1 05/15/2014 Documented in Chart   Final  . Comment 2 05/15/2014 Notify RN   Final  . WBC 05/16/2014 8.4  4.0 - 10.5 K/uL Final  . RBC 05/16/2014 4.38  3.87 - 5.11 MIL/uL Final  . Hemoglobin 05/16/2014 9.7* 12.0 - 15.0 g/dL Final  . HCT 05/16/2014 31.2* 36.0 - 46.0 % Final  . MCV 05/16/2014 71.2* 78.0 - 100.0 fL Final  . MCH 05/16/2014 22.1* 26.0 - 34.0 pg Final  . MCHC 05/16/2014 31.1  30.0 - 36.0 g/dL Final  . RDW 05/16/2014 18.9* 11.5 - 15.5 % Final  . Platelets 05/16/2014 229  150 - 400 K/uL Final  . Sodium 05/16/2014 136* 137 - 147 mEq/L Final  . Potassium 05/16/2014 4.4  3.7 - 5.3 mEq/L Final  . Chloride 05/16/2014 99  96 - 112 mEq/L Final  . CO2  05/16/2014 26  19 - 32 mEq/L Final  . Glucose, Bld 05/16/2014 155* 70 - 99 mg/dL Final  . BUN 05/16/2014 29* 6 - 23 mg/dL Final  . Creatinine, Ser 05/16/2014 1.47* 0.50 - 1.10 mg/dL Final  . Calcium 05/16/2014 9.0  8.4 - 10.5 mg/dL Final  . GFR calc non Af Amer 05/16/2014 36* >90 mL/min Final  . GFR calc Af Amer 05/16/2014 42* >90 mL/min Final   Comment: (NOTE)                          The eGFR has been calculated using the CKD EPI equation.                          This calculation has not been validated in all clinical situations.                          eGFR's persistently <90 mL/min signify possible Chronic Kidney                           Disease.  . Anion gap 05/16/2014 11  5 - 15 Final  . Glucose-Capillary 05/15/2014 150* 70 - 99 mg/dL Final  . Comment 1 05/15/2014 Notify RN   Final  . Comment 2 05/15/2014 Documented in Chart   Final  . Glucose-Capillary 05/15/2014 215* 70 - 99 mg/dL Final  . Glucose-Capillary 05/16/2014 139* 70 - 99 mg/dL Final  . Glucose-Capillary 05/16/2014 144* 70 - 99 mg/dL Final  . Glucose-Capillary 05/16/2014 116* 70 - 99 mg/dL Final  . WBC 05/17/2014 9.2  4.0 - 10.5 K/uL Final  . RBC 05/17/2014 4.69  3.87 - 5.11 MIL/uL Final  . Hemoglobin 05/17/2014 10.1* 12.0 - 15.0 g/dL Final  . HCT 05/17/2014 33.6* 36.0 - 46.0 % Final  . MCV 05/17/2014 71.6* 78.0 - 100.0 fL Final  . MCH 05/17/2014 21.5* 26.0 - 34.0 pg Final  . MCHC 05/17/2014 30.1  30.0 - 36.0 g/dL Final  . RDW 05/17/2014 19.1* 11.5 - 15.5 % Final  . Platelets 05/17/2014 233  150 - 400 K/uL Final  . Sodium 05/17/2014 136* 137 - 147 mEq/L Final  . Potassium 05/17/2014 4.1  3.7 - 5.3 mEq/L Final  . Chloride 05/17/2014 98  96 - 112 mEq/L Final  . CO2 05/17/2014 26  19 - 32 mEq/L Final  . Glucose, Bld 05/17/2014 140* 70 - 99 mg/dL Final  . BUN 05/17/2014 35* 6 - 23 mg/dL Final  . Creatinine, Ser 05/17/2014 1.68* 0.50 - 1.10 mg/dL Final  . Calcium 05/17/2014 9.0  8.4 - 10.5 mg/dL Final  . GFR calc non Af Amer 05/17/2014 31* >90 mL/min Final  . GFR calc Af Amer 05/17/2014 36* >90 mL/min Final   Comment: (NOTE)                          The eGFR has been calculated using the CKD EPI equation.                          This calculation has not been validated in all clinical situations.                          eGFR's persistently <90 mL/min signify possible Chronic Kidney  Disease.  . Anion gap 05/17/2014 12  5 - 15 Final  . Glucose-Capillary 05/16/2014 158* 70 - 99 mg/dL Final  Hospital Outpatient Visit on 05/10/2014  Component Date Value Ref Range Status  . aPTT 05/10/2014 29  24 - 37 seconds Final  . Sodium  05/10/2014 141  137 - 147 mEq/L Final  . Potassium 05/10/2014 3.8  3.7 - 5.3 mEq/L Final  . Chloride 05/10/2014 99  96 - 112 mEq/L Final  . CO2 05/10/2014 30  19 - 32 mEq/L Final  . Glucose, Bld 05/10/2014 109* 70 - 99 mg/dL Final  . BUN 05/10/2014 20  6 - 23 mg/dL Final  . Creatinine, Ser 05/10/2014 1.12* 0.50 - 1.10 mg/dL Final  . Calcium 05/10/2014 9.8  8.4 - 10.5 mg/dL Final  . Total Protein 05/10/2014 7.6  6.0 - 8.3 g/dL Final  . Albumin 05/10/2014 3.6  3.5 - 5.2 g/dL Final  . AST 05/10/2014 11  0 - 37 U/L Final  . ALT 05/10/2014 10  0 - 35 U/L Final  . Alkaline Phosphatase 05/10/2014 84  39 - 117 U/L Final  . Total Bilirubin 05/10/2014 0.4  0.3 - 1.2 mg/dL Final  . GFR calc non Af Amer 05/10/2014 50* >90 mL/min Final  . GFR calc Af Amer 05/10/2014 58* >90 mL/min Final   Comment: (NOTE)                          The eGFR has been calculated using the CKD EPI equation.                          This calculation has not been validated in all clinical situations.                          eGFR's persistently <90 mL/min signify possible Chronic Kidney                          Disease.  . Anion gap 05/10/2014 12  5 - 15 Final  . Prothrombin Time 05/10/2014 13.6  11.6 - 15.2 seconds Final  . INR 05/10/2014 1.04  0.00 - 1.49 Final  . ABO/RH(D) 05/10/2014 A POS   Final  . Antibody Screen 05/10/2014 NEG   Final  . Sample Expiration 05/10/2014 05/18/2014   Final  . Color, Urine 05/10/2014 [name]* YELLOW Final   BIOCHEMICALS MAY BE AFFECTED BY COLOR  . APPearance 05/10/2014 CLOUDY* CLEAR Final  . Specific Gravity, Urine 05/10/2014 1.023  1.005 - 1.030 Final  . pH 05/10/2014 6.0  5.0 - 8.0 Final  . Glucose, UA 05/10/2014 NEGATIVE  NEGATIVE mg/dL Final  . Hgb urine dipstick 05/10/2014 NEGATIVE  NEGATIVE Final  . Bilirubin Urine 05/10/2014 NEGATIVE  NEGATIVE Final  . Ketones, ur 05/10/2014 NEGATIVE  NEGATIVE mg/dL Final  . Protein, ur 05/10/2014 NEGATIVE  NEGATIVE mg/dL Final  . Urobilinogen,  UA 05/10/2014 1.0  0.0 - 1.0 mg/dL Final  . Nitrite 05/10/2014 NEGATIVE  NEGATIVE Final  . Leukocytes, UA 05/10/2014 NEGATIVE  NEGATIVE Final   MICROSCOPIC NOT DONE ON URINES WITH NEGATIVE PROTEIN, BLOOD, LEUKOCYTES, NITRITE, OR GLUCOSE <1000 mg/dL.  [name] MRSA, PCR 05/10/2014 NEGATIVE  NEGATIVE Final  . Staphylococcus aureus 05/10/2014 NEGATIVE  NEGATIVE Final   Comment:  The Xpert SA Assay (FDA                          approved for NASAL specimens                          in patients over 109 years of age),                          is one component of                          a comprehensive surveillance                          program.  Test performance has                          been validated by [employer] for patients greater                          than or equal to 64 year old.                          It is not intended                          to diagnose infection nor to                          guide or monitor treatment.  . WBC 05/10/2014 5.5  4.0 - 10.5 K/uL Final  . RBC 05/10/2014 5.52* 3.87 - 5.11 MIL/uL Final  . Hemoglobin 05/10/2014 11.9* 12.0 - 15.0 g/dL Final  . HCT 05/10/2014 39.4  36.0 - 46.0 % Final  . MCV 05/10/2014 71.4* 78.0 - 100.0 fL Final  . MCH 05/10/2014 21.6* 26.0 - 34.0 pg Final  . MCHC 05/10/2014 30.2  30.0 - 36.0 g/dL Final  . RDW 05/10/2014 19.3* 11.5 - 15.5 % Final  . Platelets 05/10/2014 274  150 - 400 K/uL Final  Appointment on 04/12/2014  Component Date Value Ref Range Status  . WBC 04/12/2014 5.5  3.9 - 10.3 10e3/uL Final  . NEUT# 04/12/2014 3.8  1.5 - 6.5 10e3/uL Final  . HGB 04/12/2014 11.7  11.6 - 15.9 g/dL Final  . HCT 04/12/2014 38.4  34.8 - 46.6 % Final  . Platelets 04/12/2014 281  145 - 400 10e3/uL Final  . MCV 04/12/2014 71.0* 79.5 - 101.0 fL Final  . MCH 04/12/2014 21.7* 25.1 - 34.0 pg Final  . MCHC 04/12/2014 30.5* 31.5 - 36.0 g/dL Final  . RBC 04/12/2014 5.42  3.70 - 5.45  10e6/uL Final  . RDW 04/12/2014 23.1* 11.2 - 14.5 % Final  . lymph# 04/12/2014 1.0  0.9 - 3.3 10e3/uL Final  . MONO# 04/12/2014 0.4  0.1 - 0.9 10e3/uL Final  . Eosinophils Absolute 04/12/2014 0.2  0.0 - 0.5 10e3/uL Final  . Basophils Absolute 04/12/2014 0.1  0.0 - 0.1 10e3/uL Final  . NEUT% 04/12/2014 69.8  38.4 - 76.8 % Final  .  LYMPH% 04/12/2014 18.1  14.0 - 49.7 % Final  . MONO% 04/12/2014 8.0  0.0 - 14.0 % Final  . EOS% 04/12/2014 3.2  0.0 - 7.0 % Final  . BASO% 04/12/2014 0.9  0.0 - 2.0 % Final  . Sodium 04/12/2014 142  136 - 145 mEq/L Final  . Potassium 04/12/2014 4.0  3.5 - 5.1 mEq/L Final  . Chloride 04/12/2014 106  98 - 109 mEq/L Final  . CO2 04/12/2014 26  22 - 29 mEq/L Final  . Glucose 04/12/2014 99  70 - 140 mg/dl Final  . BUN 04/12/2014 19.7  7.0 - 26.0 mg/dL Final  . Creatinine 04/12/2014 1.1  0.6 - 1.1 mg/dL Final  . Total Bilirubin 04/12/2014 0.39  0.20 - 1.20 mg/dL Final  . Alkaline Phosphatase 04/12/2014 77  40 - 150 U/L Final  . AST 04/12/2014 14  5 - 34 U/L Final  . ALT 04/12/2014 12  0 - 55 U/L Final  . Total Protein 04/12/2014 7.3  6.4 - 8.3 g/dL Final  . Albumin 04/12/2014 3.5  3.5 - 5.0 g/dL Final  . Calcium 04/12/2014 9.8  8.4 - 10.4 mg/dL Final  . Anion Gap 04/12/2014 10  3 - 11 mEq/L Final     X-Rays:Dg Chest 2 View  05/10/2014   CLINICAL DATA:  Hypertension  EXAM: CHEST  2 VIEW  COMPARISON:  03/11/2012  FINDINGS: Cardiac silhouette is normal in size. Moderate hiatal hernia. Right hemidiaphragm is eventrated. No mediastinal or hilar masses or evidence of adenopathy.  Clear lungs.  No pleural effusion or pneumothorax.  Bony thorax is demineralized but intact.  IMPRESSION: No acute cardiopulmonary disease.   Electronically Signed   By: Lajean Manes M.D.   On: 05/10/2014 11:49    EKG: Orders placed during the hospital encounter of 05/10/14  . EKG 12-LEAD  . EKG 12-LEAD     Hospital Course: [name] is a 63 y.o. who was admitted to [facility]. They were brought to the operating room on 05/15/2014 and underwent Procedure(s): RIGHT TOTAL KNEE ARTHROPLASTY.  Patient tolerated the procedure well and was later transferred to the recovery room and then to the orthopaedic floor for postoperative care.  They were given PO and IV analgesics for pain control following their surgery.  They were given 24 hours of postoperative antibiotics of  Anti-infectives   Start     Dose/Rate Route Frequency Ordered Stop   05/15/14 2000  ceFAZolin (ANCEF) IVPB 1 g/50 mL premix     1 g 100 mL/hr over 30 Minutes Intravenous Every 6 hours 05/15/14 1756 05/16/14 0213   05/15/14 1318  polymyxin B 500,000 Units, bacitracin 50,000 Units in sodium chloride irrigation 0.9 % 500 mL irrigation  Status:  Discontinued       As needed 05/15/14 1318 05/15/14 1548   05/15/14 1004  ceFAZolin (ANCEF) IVPB 2 g/50 mL premix     2 g 100 mL/hr over 30 Minutes Intravenous On call to O.R. 05/15/14 1004 05/15/14 1253     and started on DVT prophylaxis in the form of Xarelto.   PT and OT were ordered for total joint protocol.  Discharge planning consulted to help with postop disposition and equipment needs.  Patient had a fair night on the evening of surgery.  They started to get up OOB with therapy on day one. Hemovac drain was pulled without difficulty.  Continued to work with therapy into day two.  Dressing was changed on day two and  the incision was clean and dry.  Patient was seen in rounds and was ready to go to SNF for continued therapy.   Diet: Diabetic diet Activity:WBAT Follow-up:in 2 weeks Disposition - Skilled nursing facility Discharged Condition: stable   Discharge Instructions   Call MD / Call 911    Complete by:  As directed   If you experience chest pain or shortness of breath, CALL 911 and be transported to the hospital emergency room.  If you develope a fever above 101 F, pus (white drainage) or increased drainage or redness at the wound, or calf pain,  call your surgeon's office.     Constipation Prevention    Complete by:  As directed   Drink plenty of fluids.  Prune juice may be helpful.  You may use a stool softener, such as Colace (over the counter) 100 mg twice a day.  Use MiraLax (over the counter) for constipation as needed.     Diet Carb Modified    Complete by:  As directed      Discharge instructions    Complete by:  As directed   Walk with your walker. Weight bearing as tolerated. Do not change the dressing over the incision unless there is excess drainage Shower only, no tub bath. Discontinue use of vitamins and supplements (except iron) until completion of three week course of Xarelto Call if any temperatures greater than 101 or any wound complications: [phone] during the day and ask for Dr. Charlestine Night nurse, Brunilda Payor.     Do not put a pillow under the knee. Place it under the heel.    Complete by:  As directed      Driving restrictions    Complete by:  As directed   No driving     Increase activity slowly as tolerated    Complete by:  As directed             Medication List    STOP taking these medications       calcium-vitamin D 500-200 MG-UNIT per tablet  Commonly known as:  OSCAL WITH D     cholecalciferol 1000 UNITS tablet  Commonly known as:  VITAMIN D     ibuprofen 200 MG tablet  Commonly known as:  ADVIL,MOTRIN     OVER THE COUNTER MEDICATION      TAKE these medications       anastrozole 1 MG tablet  Commonly known as:  ARIMIDEX  Take 1 tablet (1 mg total) by mouth daily.     cetirizine 10 MG tablet  Commonly known as:  ZYRTEC  Take 10 mg by mouth daily as needed. For allergies.     docusate sodium 100 MG capsule  Commonly known as:  COLACE  Take 1 capsule (100 mg total) by mouth 2 (two) times daily.     iron polysaccharides 150 MG capsule  Commonly known as:  POLY-IRON 150  Take 1 capsule (150 mg total) by mouth 2 (two) times daily.     JANUMET XR 50-500 MG Tb24  Generic drug:   SitaGLIPtin-MetFORMIN HCl  Take 1 tablet by mouth daily.     methocarbamol 500 MG tablet  Commonly known as:  ROBAXIN  Take 1 tablet (500 mg total) by mouth every 6 (six) hours as needed for muscle spasms.     olmesartan-hydrochlorothiazide 40-25 MG per tablet  Commonly known as:  BENICAR HCT  Take 1 tablet by mouth every morning.     omeprazole 20 MG capsule  Commonly known as:  PRILOSEC  Take 20 mg by mouth daily as needed (Acid reflux). Alternates with prevacid depending upon cost For reflux     oxyCODONE-acetaminophen 5-325 MG per tablet  Commonly known as:  PERCOCET/ROXICET  Take 1-2 tablets by mouth every 4 (four) hours as needed for moderate pain.     rivaroxaban 10 MG Tabs tablet  Commonly known as:  XARELTO  Take 1 tablet (10 mg total) by mouth daily with breakfast.           Follow-up Information   Follow up with GIOFFRE,RONALD A, MD. Schedule an appointment as soon as possible for a visit in 2 weeks.   Specialty:  Orthopedic Surgery   Contact information:   [address] [place] [address] [phone]       Signed: Ardeen Jourdain, PA-C Orthopaedic Surgery 05/17/2014, 7:03 AM

## 2014-05-18 ENCOUNTER — Encounter: Payer: Self-pay | Admitting: Adult Health

## 2014-05-18 ENCOUNTER — Non-Acute Institutional Stay (SKILLED_NURSING_FACILITY): Payer: BC Managed Care – PPO | Admitting: Adult Health

## 2014-05-18 DIAGNOSIS — Z96651 Presence of right artificial knee joint: Secondary | ICD-10-CM

## 2014-05-18 DIAGNOSIS — M1711 Unilateral primary osteoarthritis, right knee: Secondary | ICD-10-CM

## 2014-05-18 DIAGNOSIS — E119 Type 2 diabetes mellitus without complications: Secondary | ICD-10-CM | POA: Insufficient documentation

## 2014-05-18 DIAGNOSIS — K5900 Constipation, unspecified: Secondary | ICD-10-CM

## 2014-05-18 DIAGNOSIS — Z96659 Presence of unspecified artificial knee joint: Secondary | ICD-10-CM

## 2014-05-18 DIAGNOSIS — I10 Essential (primary) hypertension: Secondary | ICD-10-CM

## 2014-05-18 DIAGNOSIS — Z853 Personal history of malignant neoplasm of breast: Secondary | ICD-10-CM

## 2014-05-18 DIAGNOSIS — J309 Allergic rhinitis, unspecified: Secondary | ICD-10-CM | POA: Insufficient documentation

## 2014-05-18 DIAGNOSIS — M1710 Unilateral primary osteoarthritis, unspecified knee: Secondary | ICD-10-CM

## 2014-05-18 DIAGNOSIS — E1165 Type 2 diabetes mellitus with hyperglycemia: Secondary | ICD-10-CM

## 2014-05-18 DIAGNOSIS — IMO0001 Reserved for inherently not codable concepts without codable children: Secondary | ICD-10-CM

## 2014-05-18 DIAGNOSIS — D62 Acute posthemorrhagic anemia: Secondary | ICD-10-CM

## 2014-05-18 HISTORY — DX: Personal history of malignant neoplasm of breast: Z85.3

## 2014-05-18 NOTE — Progress Notes (Signed)
Patient ID: [name], female   DOB: Jun 20, 1951, 63 y.o.   MRN: [id number]               PROGRESS NOTE  DATE: 05/18/2014  FACILITY: Nursing Home Location: [facility]  LEVEL OF CARE: SNF (31)  Acute Visit  CHIEF COMPLAINT:  Follow-up Hospitalization   HISTORY OF PRESENT ILLNESS: This is a 63 year old female who has been admitted to [facility] on 05/17/14 from [facility] with Osteoarthritis S/P right total knee replacement. She has been admitted for short-term rehabilitation.  REASSESSMENT OF ONGOING PROBLEM(S):  HTN: Pt 's HTN is unstable.  Denies CP, sob, DOE, pedal edema, headaches, dizziness or visual disturbances.  No complications from the medications currently being used.  Last BP : 155/79  ALLERGIC RHINITIS: Allergic rhinitis remains stable.  Patient denies ongoing symptoms such as runny nose sneezing or tearing. No complications reported from the current medication(s) being used.  ANEMIA: The anemia has been stable. The patient denies fatigue, melena or hematochezia. No complications from the medications currently being used. 8/15 hgb 10.1  PAST MEDICAL HISTORY : Reviewed.  No changes/see problem list  CURRENT MEDICATIONS: Reviewed per MAR/see medication list  REVIEW OF SYSTEMS:  GENERAL: no change in appetite, no fatigue, no weight changes, no fever, chills or weakness RESPIRATORY: no cough, SOB, DOE, wheezing, hemoptysis CARDIAC: no chest pain, or palpitations, + edema GI: no abdominal pain, diarrhea, heart burn, nausea or vomiting, +constipation  PHYSICAL EXAMINATION  GENERAL: no acute distress, obese EYES: conjunctivae normal, sclerae normal, normal eye lids NECK: supple, trachea midline, no neck masses, no thyroid tenderness, no thyromegaly LYMPHATICS: no LAN in the neck, no supraclavicular LAN RESPIRATORY: breathing is even & unlabored, BS CTAB CARDIAC: RRR, no murmur,no extra heart sounds, RLE edema 2+ GI: abdomen soft, normal  BS, no masses, no tenderness, no hepatomegaly, no splenomegaly EXTREMITIES:  Able to move all 4 extremities PSYCHIATRIC: the patient is alert & oriented to person, affect & behavior appropriate  LABS/RADIOLOGY: Labs reviewed: Basic Metabolic Panel:  Recent Labs  05/10/14 1000 05/16/14 0416 05/17/14 0425  NA 141 136* 136*  K 3.8 4.4 4.1  CL 99 99 98  CO2 30 26 26   GLUCOSE 109* 155* 140*  BUN 20 29* 35*  CREATININE 1.12* 1.47* 1.68*  CALCIUM 9.8 9.0 9.0   Liver Function Tests:  Recent Labs  02/08/14 0947 04/12/14 0844 05/10/14 1000  AST 16 14 11   ALT 24 12 10   ALKPHOS 84 77 84  BILITOT 0.24 0.39 0.4  PROT 7.1 7.3 7.6  ALBUMIN 3.3* 3.5 3.6   CBC:  Recent Labs  02/08/14 0947 02/15/14 0957 04/12/14 0844 05/10/14 1000 05/16/14 0416 05/17/14 0425  WBC 5.7 5.6 5.5 5.5 8.4 9.2  NEUTROABS 3.7 3.5 3.8  --   --   --   HGB 9.5* 10.1* 11.7 11.9* 9.7* 10.1*  HCT 32.0* 33.7* 38.4 39.4 31.2* 33.6*  MCV 67.4* 67.0* 71.0* 71.4* 71.2* 71.6*  PLT 288 314 281 274 229 233   CBG:  Recent Labs  05/16/14 2210 05/17/14 0724 05/17/14 1126  GLUCAP 158* 122* 121*   EXAM: DIGITAL DIAGNOSTIC  BILATERAL MAMMOGRAM WITH CAD   COMPARISON:  None.   ACR Breast Density Category b: There are scattered areas of fibroglandular density.   FINDINGS: Postlumpectomy changes on the right with improved postradiation changes. No new findings suspicious for malignancy in either breast.   Mammographic images were processed with CAD.   IMPRESSION: No  evidence of malignancy.   RECOMMENDATION: Bilateral diagnostic mammogram in 1 year.   I have discussed the findings and recommendations with the patient. Results were also provided in writing at the conclusion of the visit. If applicable, a reminder letter will be sent to the patient regarding the next appointment.   BI-RADS CATEGORY  2: Benign. EXAM: CHEST  2 VIEW   COMPARISON:  03/11/2012   FINDINGS: Cardiac silhouette is  normal in size. Moderate hiatal hernia. Right hemidiaphragm is eventrated. No mediastinal or hilar masses or evidence of adenopathy.   Clear lungs.  No pleural effusion or pneumothorax.   Bony thorax is demineralized but intact.   IMPRESSION: No acute cardiopulmonary disease.    ASSESSMENT/PLAN:  Osteoarthritis status post right total knee replacement - for rehabilitation History of breast cancer, right - continue Arimidex Allergic rhinitis - stable; continue Zyrtec Constipation - continue Colace and MiraLax 17 g last 6-8 ounces of liquid by mouth twice a day x2 days then daily Anemia - continue iron supplementation Diabetes mellitus, type II - continue Janumet XR Hypertension - we'll monitor her BP/heart rate every shift x1 week; continue losartan/HCT   CPT CODE: 40352  Joelie Schou Vargas - NP [facility] [phone]

## 2014-05-22 ENCOUNTER — Non-Acute Institutional Stay (SKILLED_NURSING_FACILITY): Payer: BC Managed Care – PPO | Admitting: Internal Medicine

## 2014-05-22 DIAGNOSIS — N183 Chronic kidney disease, stage 3 unspecified: Secondary | ICD-10-CM

## 2014-05-22 DIAGNOSIS — M1711 Unilateral primary osteoarthritis, right knee: Secondary | ICD-10-CM

## 2014-05-22 DIAGNOSIS — D62 Acute posthemorrhagic anemia: Secondary | ICD-10-CM

## 2014-05-22 DIAGNOSIS — E119 Type 2 diabetes mellitus without complications: Secondary | ICD-10-CM

## 2014-05-22 DIAGNOSIS — M1710 Unilateral primary osteoarthritis, unspecified knee: Secondary | ICD-10-CM

## 2014-05-24 ENCOUNTER — Non-Acute Institutional Stay: Payer: BC Managed Care – PPO | Admitting: Adult Health

## 2014-05-24 ENCOUNTER — Encounter: Payer: Self-pay | Admitting: Adult Health

## 2014-05-24 DIAGNOSIS — N183 Chronic kidney disease, stage 3 unspecified: Secondary | ICD-10-CM | POA: Insufficient documentation

## 2014-05-24 DIAGNOSIS — E119 Type 2 diabetes mellitus without complications: Secondary | ICD-10-CM

## 2014-05-24 DIAGNOSIS — Z96651 Presence of right artificial knee joint: Secondary | ICD-10-CM

## 2014-05-24 DIAGNOSIS — D62 Acute posthemorrhagic anemia: Secondary | ICD-10-CM

## 2014-05-24 DIAGNOSIS — Z853 Personal history of malignant neoplasm of breast: Secondary | ICD-10-CM

## 2014-05-24 DIAGNOSIS — K5900 Constipation, unspecified: Secondary | ICD-10-CM

## 2014-05-24 DIAGNOSIS — Z96659 Presence of unspecified artificial knee joint: Secondary | ICD-10-CM

## 2014-05-24 DIAGNOSIS — I10 Essential (primary) hypertension: Secondary | ICD-10-CM

## 2014-05-24 DIAGNOSIS — M1710 Unilateral primary osteoarthritis, unspecified knee: Secondary | ICD-10-CM

## 2014-05-24 DIAGNOSIS — J309 Allergic rhinitis, unspecified: Secondary | ICD-10-CM

## 2014-05-24 DIAGNOSIS — M1711 Unilateral primary osteoarthritis, right knee: Secondary | ICD-10-CM

## 2014-05-24 DIAGNOSIS — N1831 Chronic kidney disease, stage 3a: Secondary | ICD-10-CM | POA: Insufficient documentation

## 2014-05-24 NOTE — Progress Notes (Signed)
HISTORY & PHYSICAL  DATE: 05/22/2014   FACILITY: [facility]  LEVEL OF CARE: SNF (31)  ALLERGIES:  No Known Allergies  CHIEF COMPLAINT:  Manage right knee osteoarthritis, acute blood loss anemia and diabetes mellitus  HISTORY OF PRESENT ILLNESS: Patient is a 63 year old African American female.  KNEE OSTEOARTHRITIS: Patient had a history of pain and functional disability in the knee due to end-stage osteoarthritis and has failed nonsurgical conservative treatments. Patient had worsening of pain with activity and weight bearing, pain that interfered with activities of daily living & pain with passive range of motion. Therefore patient underwent total knee arthroplasty and tolerated the procedure well. Patient is admitted to this facility for sort short-term rehabilitation. Patient denies knee pain.  ANEMIA: The anemia has been stable. The patient denies fatigue, melena or hematochezia. No complications from the medications currently being used. Postoperative the patient suffered an acute blood loss. She did not require a transfusion.  DM:pt's DM remains stable.  Pt denies polyuria, polydipsia, polyphagia, changes in vision or hypoglycemic episodes.  No complications noted from the medication presently being used.  Last hemoglobin A1c is: Not available.   PAST MEDICAL HISTORY :  Past Medical History  Diagnosis Date  . Bradycardia   . Iron deficiency anemia   . Hiatal hernia     large  . Diabetes mellitus     type 2  . Morbid obesity   . GERD (gastroesophageal reflux disease)   . Shortness of breath     with exertion  . Sinus drainage   . Breast cancer 03/16/12    right lumpectomy=high grade ductal ca in situ,ER/PR=positive  . Cancer     right breast ductal carcinoma in situ  . Hypertension     Echocardiogram 03/2011: EF 83-15%, grade 1 diastolic dysfunction, mild MAC, mild LAE  . Blood transfusion 03/14/12  . S/P radiation therapy 05/16/12 - 06/29/12     Right Breast: 50 Gray/ 25 Fractions with Boost: 10 Gray/ 5 Fractions  . Use of anastrozole (Arimidex) Started 10/13  . Chronic kidney disease     stage III   . Arthritis     PAST SURGICAL HISTORY: Past Surgical History  Procedure Laterality Date  . Breast lumpectomy  04/1997  . Biospy right breast  02/01/12    Right Breast Needle Core Biopsy: ductal Carcinoma In situ with Papillary Features, ER/PR Positive  . Lumpectomy right breast  03/16/12    Ductal carcinoma In situ: clear margins: 0/1 Node Negative  . Total knee arthroplasty Right 05/15/2014    Procedure: RIGHT TOTAL KNEE ARTHROPLASTY;  Surgeon: Tobi Bastos, MD;  Location: [facility];  Service: Orthopedics;  Laterality: Right;    SOCIAL HISTORY:  reports that she has never smoked. She has never used smokeless tobacco. She reports that she does not drink alcohol or use illicit drugs.  FAMILY HISTORY:  Family History  Problem Relation Age of Onset  . Coronary artery disease Father     heart attack  . Cancer Sister     breast  . Lymphoma Sister     Deceased at age 68    CURRENT MEDICATIONS: Reviewed per MAR/see medication list  REVIEW OF SYSTEMS: Cardiac-complains of lower extremity swelling,  See HPI otherwise 14 point ROS is negative.  PHYSICAL EXAMINATION  VS:  See VS section  GENERAL: no acute distress, morbidly obese body habitus EYES: conjunctivae normal, sclerae normal, normal eye lids MOUTH/THROAT: lips without lesions,no  lesions in the mouth,tongue is without lesions,uvula elevates in midline NECK: supple, trachea midline, no neck masses, no thyroid tenderness, no thyromegaly LYMPHATICS: no LAN in the neck, no supraclavicular LAN RESPIRATORY: breathing is even & unlabored, BS CTAB CARDIAC: RRR, no murmur,no extra heart sounds, +4 bilateral lower extremity edema GI:  ABDOMEN: abdomen soft, normal BS, no masses, no tenderness  LIVER/SPLEEN: no hepatomegaly, no splenomegaly MUSCULOSKELETAL: HEAD: normal to  inspection  EXTREMITIES: LEFT UPPER EXTREMITY: full range of motion, normal strength & tone RIGHT UPPER EXTREMITY:  full range of motion, normal strength & tone LEFT LOWER EXTREMITY:  Moderate range of motion, normal strength & tone RIGHT LOWER EXTREMITY: range of motion not tested due to surgery , normal strength & tone PSYCHIATRIC: the patient is alert & oriented to person, affect & behavior appropriate  LABS/RADIOLOGY:  Labs reviewed: Basic Metabolic Panel:  Recent Labs  05/10/14 1000 05/16/14 0416 05/17/14 0425  NA 141 136* 136*  K 3.8 4.4 4.1  CL 99 99 98  CO2 30 26 26   GLUCOSE 109* 155* 140*  BUN 20 29* 35*  CREATININE 1.12* 1.47* 1.68*  CALCIUM 9.8 9.0 9.0   Liver Function Tests:  Recent Labs  02/08/14 0947 04/12/14 0844 05/10/14 1000  AST 16 14 11   ALT 24 12 10   ALKPHOS 84 77 84  BILITOT 0.24 0.39 0.4  PROT 7.1 7.3 7.6  ALBUMIN 3.3* 3.5 3.6   CBC:  Recent Labs  02/08/14 0947 02/15/14 0957 04/12/14 0844 05/10/14 1000 05/16/14 0416 05/17/14 0425  WBC 5.7 5.6 5.5 5.5 8.4 9.2  NEUTROABS 3.7 3.5 3.8  --   --   --   HGB 9.5* 10.1* 11.7 11.9* 9.7* 10.1*  HCT 32.0* 33.7* 38.4 39.4 31.2* 33.6*  MCV 67.4* 67.0* 71.0* 71.4* 71.2* 71.6*  PLT 288 314 281 274 229 233   CBG:  Recent Labs  05/16/14 2210 05/17/14 0724 05/17/14 1126  GLUCAP 158* 122* 121*    CHEST  2 VIEW   COMPARISON:  03/11/2012   FINDINGS: Cardiac silhouette is normal in size. Moderate hiatal hernia. Right hemidiaphragm is eventrated. No mediastinal or hilar masses or evidence of adenopathy.   Clear lungs.  No pleural effusion or pneumothorax.   Bony thorax is demineralized but intact.   IMPRESSION: No acute cardiopulmonary disease.    ASSESSMENT/PLAN:  Right knee osteoarthritis-status post total knee arthroplasty. Continue rehabilitation. Acute blood loss anemia-check hemoglobin and iron studies. Diabetes mellitus-continue janumet. Chronic kidney disease stage  III-recheck renal functions Constipation-MiraLax was started Check CBC and BMP  I have reviewed patient's medical records received at admission/from hospitalization.  CPT CODE: 81856  Gayani Y Dasanayaka, [place] [phone]

## 2014-05-25 NOTE — Progress Notes (Signed)
Patient ID: [name], female   DOB: 02/02/1951, 63 y.o.   MRN: [id number]             PROGRESS NOTE  DATE:   05/24/14  FACILITY: Nursing Home Location: [facility]  LEVEL OF CARE: SNF (31)  Acute Visit  CHIEF COMPLAINT:  Discharge Notes  HISTORY OF PRESENT ILLNESS: This is a 63 year old female who is for discharge home and will have outpatient care services. DME Bedside commode. She has been admitted to [facility] on 05/17/14 from [facility] with Osteoarthritis S/P right total knee replacement. Patient was admitted to this facility for short-term rehabilitation after the patient's recent hospitalization.  Patient has completed SNF rehabilitation and therapy has cleared the patient for discharge.  REASSESSMENT OF ONGOING PROBLEM(S):  HTN: Pt 's HTN is unstable.  Denies CP, sob, DOE, headaches, dizziness or visual disturbances.  No complications from the medications currently being used.  Last BP : 142/86  DM:pt's DM remains stable.  Pt denies polyuria, polydipsia, polyphagia, changes in vision or hypoglycemic episodes.  No complications noted from the medication presently being used.  Last hemoglobin A1c is: not available  CONSTIPATION: The constipation remains stable. No complications from the medications presently being used. Patient denies ongoing constipation, abdominal pain, nausea or vomiting.  PAST MEDICAL HISTORY : Reviewed.  No changes/see problem list  CURRENT MEDICATIONS: Reviewed per MAR/see medication list  REVIEW OF SYSTEMS:  GENERAL: no change in appetite, no fatigue, no weight changes, no fever, chills or weakness RESPIRATORY: no cough, SOB, DOE, wheezing, hemoptysis CARDIAC: no chest pain, or palpitations, + edema GI: no abdominal pain, diarrhea, heart burn, nausea or vomiting, +constipation  PHYSICAL EXAMINATION  GENERAL: no acute distress, obese NECK: supple, trachea midline, no neck masses, no thyroid tenderness, no  thyromegaly LYMPHATICS: no LAN in the neck, no supraclavicular LAN RESPIRATORY: breathing is even & unlabored, BS CTAB CARDIAC: RRR, no murmur,no extra heart sounds, RLE edema 2+ GI: abdomen soft, normal BS, no masses, no tenderness, no hepatomegaly, no splenomegaly EXTREMITIES:  Able to move all 4 extremities PSYCHIATRIC: the patient is alert & oriented to person, affect & behavior appropriate  LABS/RADIOLOGY: Labs reviewed: Basic Metabolic Panel:  Recent Labs  05/10/14 1000 05/16/14 0416 05/17/14 0425  NA 141 136* 136*  K 3.8 4.4 4.1  CL 99 99 98  CO2 30 26 26   GLUCOSE 109* 155* 140*  BUN 20 29* 35*  CREATININE 1.12* 1.47* 1.68*  CALCIUM 9.8 9.0 9.0   Liver Function Tests:  Recent Labs  02/08/14 0947 04/12/14 0844 05/10/14 1000  AST 16 14 11   ALT 24 12 10   ALKPHOS 84 77 84  BILITOT 0.24 0.39 0.4  PROT 7.1 7.3 7.6  ALBUMIN 3.3* 3.5 3.6   CBC:  Recent Labs  02/08/14 0947 02/15/14 0957 04/12/14 0844 05/10/14 1000 05/16/14 0416 05/17/14 0425  WBC 5.7 5.6 5.5 5.5 8.4 9.2  NEUTROABS 3.7 3.5 3.8  --   --   --   HGB 9.5* 10.1* 11.7 11.9* 9.7* 10.1*  HCT 32.0* 33.7* 38.4 39.4 31.2* 33.6*  MCV 67.4* 67.0* 71.0* 71.4* 71.2* 71.6*  PLT 288 314 281 274 229 233   CBG:  Recent Labs  05/16/14 2210 05/17/14 0724 05/17/14 1126  GLUCAP 158* 122* 121*   EXAM: DIGITAL DIAGNOSTIC  BILATERAL MAMMOGRAM WITH CAD   COMPARISON:  None.   ACR Breast Density Category b: There are scattered areas of fibroglandular density.   FINDINGS: Postlumpectomy changes  on the right with improved postradiation changes. No new findings suspicious for malignancy in either breast.   Mammographic images were processed with CAD.   IMPRESSION: No evidence of malignancy.   RECOMMENDATION: Bilateral diagnostic mammogram in 1 year.   I have discussed the findings and recommendations with the patient. Results were also provided in writing at the conclusion of the visit. If  applicable, a reminder letter will be sent to the patient regarding the next appointment.   BI-RADS CATEGORY  2: Benign. EXAM: CHEST  2 VIEW   COMPARISON:  03/11/2012   FINDINGS: Cardiac silhouette is normal in size. Moderate hiatal hernia. Right hemidiaphragm is eventrated. No mediastinal or hilar masses or evidence of adenopathy.   Clear lungs.  No pleural effusion or pneumothorax.   Bony thorax is demineralized but intact.   IMPRESSION: No acute cardiopulmonary disease.    ASSESSMENT/PLAN:  Osteoarthritis status post right total knee replacement - for outpatient care History of breast cancer, right - continue Arimidex Allergic rhinitis - stable; continue Zyrtec Constipation - continue Colace and MiraLax Anemia - continue iron supplementation Diabetes mellitus, type II - continue Janumet XR Hypertension - continue losartan/HCT   I have filled out patient's discharge paperwork and written prescriptions.  Patient will have outpatient services.  DME provided: Bedside commode  Total discharge time: Greater than 30 minutes  Discharge time involved coordination of the discharge process with social worker, nursing staff and therapy department.    CPT CODE: 25498  Seth Bake - NP [facility] [phone]

## 2014-07-30 ENCOUNTER — Encounter: Payer: Self-pay | Admitting: Adult Health

## 2014-09-10 ENCOUNTER — Other Ambulatory Visit: Payer: BC Managed Care – PPO

## 2014-09-10 ENCOUNTER — Other Ambulatory Visit: Payer: Self-pay | Admitting: *Deleted

## 2014-11-07 DIAGNOSIS — H532 Diplopia: Secondary | ICD-10-CM | POA: Insufficient documentation

## 2014-11-07 HISTORY — DX: Diplopia: H53.2

## 2015-02-06 ENCOUNTER — Other Ambulatory Visit: Payer: Self-pay | Admitting: Oncology

## 2015-02-06 DIAGNOSIS — Z853 Personal history of malignant neoplasm of breast: Secondary | ICD-10-CM

## 2015-02-07 ENCOUNTER — Ambulatory Visit
Admission: RE | Admit: 2015-02-07 | Discharge: 2015-02-07 | Disposition: A | Discharge status: Home / Self Care | Payer: BLUE CROSS/BLUE SHIELD | Source: Ambulatory Visit | Attending: Physician Assistant | Admitting: Physician Assistant

## 2015-02-07 DIAGNOSIS — Z853 Personal history of malignant neoplasm of breast: Secondary | ICD-10-CM

## 2015-02-07 DIAGNOSIS — C50919 Malignant neoplasm of unspecified site of unspecified female breast: Secondary | ICD-10-CM

## 2015-02-07 DIAGNOSIS — Z780 Asymptomatic menopausal state: Secondary | ICD-10-CM

## 2015-02-13 ENCOUNTER — Other Ambulatory Visit: Payer: Self-pay | Admitting: *Deleted

## 2015-02-13 DIAGNOSIS — C50919 Malignant neoplasm of unspecified site of unspecified female breast: Secondary | ICD-10-CM

## 2015-02-14 ENCOUNTER — Other Ambulatory Visit (HOSPITAL_BASED_OUTPATIENT_CLINIC_OR_DEPARTMENT_OTHER): Payer: BLUE CROSS/BLUE SHIELD

## 2015-02-14 DIAGNOSIS — D0511 Intraductal carcinoma in situ of right breast: Secondary | ICD-10-CM

## 2015-02-14 DIAGNOSIS — C50919 Malignant neoplasm of unspecified site of unspecified female breast: Secondary | ICD-10-CM

## 2015-02-14 LAB — CBC WITH DIFFERENTIAL/PLATELET
BASO%: 0.9 % (ref 0.0–2.0)
BASOS ABS: 0 10*3/uL (ref 0.0–0.1)
EOS%: 3.1 % (ref 0.0–7.0)
Eosinophils Absolute: 0.2 10*3/uL (ref 0.0–0.5)
HCT: 37.7 % (ref 34.8–46.6)
HGB: 11.9 g/dL (ref 11.6–15.9)
LYMPH%: 19.3 % (ref 14.0–49.7)
MCH: 22.2 pg — ABNORMAL LOW (ref 25.1–34.0)
MCHC: 31.5 g/dL (ref 31.5–36.0)
MCV: 70.4 fL — ABNORMAL LOW (ref 79.5–101.0)
MONO#: 0.4 10*3/uL (ref 0.1–0.9)
MONO%: 7.9 % (ref 0.0–14.0)
NEUT#: 3.6 10*3/uL (ref 1.5–6.5)
NEUT%: 68.8 % (ref 38.4–76.8)
Platelets: 272 10*3/uL (ref 145–400)
RBC: 5.36 10*6/uL (ref 3.70–5.45)
RDW: 17.6 % — ABNORMAL HIGH (ref 11.2–14.5)
WBC: 5.2 10*3/uL (ref 3.9–10.3)
lymph#: 1 10*3/uL (ref 0.9–3.3)

## 2015-02-14 LAB — COMPREHENSIVE METABOLIC PANEL (CC13)
ALT: 10 U/L (ref 0–55)
AST: 13 U/L (ref 5–34)
Albumin: 3.4 g/dL — ABNORMAL LOW (ref 3.5–5.0)
Alkaline Phosphatase: 80 U/L (ref 40–150)
Anion Gap: 11 mEq/L (ref 3–11)
BUN: 22.1 mg/dL (ref 7.0–26.0)
CALCIUM: 9.7 mg/dL (ref 8.4–10.4)
CHLORIDE: 105 meq/L (ref 98–109)
CO2: 25 meq/L (ref 22–29)
CREATININE: 1.4 mg/dL — AB (ref 0.6–1.1)
EGFR: 45 mL/min/{1.73_m2} — ABNORMAL LOW (ref >90)
Glucose: 108 mg/dl (ref 70–140)
POTASSIUM: 3.9 meq/L (ref 3.5–5.1)
SODIUM: 141 meq/L (ref 136–145)
TOTAL PROTEIN: 7.2 g/dL (ref 6.4–8.3)
Total Bilirubin: 0.34 mg/dL (ref 0.20–1.20)

## 2015-02-21 ENCOUNTER — Ambulatory Visit (HOSPITAL_BASED_OUTPATIENT_CLINIC_OR_DEPARTMENT_OTHER): Payer: BLUE CROSS/BLUE SHIELD | Admitting: Oncology

## 2015-02-21 VITALS — BP 151/93 | HR 96 | Temp 99.0°F | Resp 18 | Ht 62.0 in | Wt 228.7 lb

## 2015-02-21 DIAGNOSIS — D569 Thalassemia, unspecified: Secondary | ICD-10-CM | POA: Insufficient documentation

## 2015-02-21 DIAGNOSIS — D0511 Intraductal carcinoma in situ of right breast: Secondary | ICD-10-CM

## 2015-02-21 DIAGNOSIS — D509 Iron deficiency anemia, unspecified: Secondary | ICD-10-CM | POA: Diagnosis not present

## 2015-02-21 DIAGNOSIS — M8580 Other specified disorders of bone density and structure, unspecified site: Secondary | ICD-10-CM | POA: Diagnosis not present

## 2015-02-21 DIAGNOSIS — C50911 Malignant neoplasm of unspecified site of right female breast: Secondary | ICD-10-CM

## 2015-02-21 NOTE — Progress Notes (Signed)
ID: [name]   DOB: October 09, 1950  MR#: [id number]  WUJ#:[id number]  PCP: Maximino Greenland, MD GYN: SUDonnie Mesa MD OTHER MD: Eppie Gibson, MD;  Laurence Spates, MD  CHIEF COMPLAINT: Estrogen receptor positive breast cancer  CURRENT TREATMENT: Anastrozole  HISTORY OF PRESENT ILLNESS: From the original intake note:  Ms. [name] had routine screening mammography at the breast center 01/21/2012. This showed a possible mass in the right breast. Additional views were obtained may 06/17/2012 and these confirmed an irregular mass in the upper outer right breast, which was palpable. Ultrasound showed a solid mass measuring 1.2 cm with a dilated duct extending medially from the mass. The right axilla was unremarkable by ultrasound.  Biopsy of the right breast mass on 02/11/2012 showed ([id number]) ductal carcinoma in situ, involving a papilloma. Estrogen receptor was 100% positive. Progesterone receptor was 92% positive. Breast MRI was obtained 02/19/2012. This confirmed an oval nonenhancing mass with lobulated margins in the upper outer quadrant of the right breast measuring 1.4 cm, with no additional suspicious masses and no axillary or internal mammary adenopathy.  Accordingly on 03/16/2012 the patient underwent right lumpectomy with sentinel lymph node sampling. This showed (SZA [phone]) ductal carcinoma in situ, high-grade, measuring 1.8 cm, with negative but close margins, the closest being 1 mm. The sentinel lymph node was negative. The patient tolerated the surgery well.   Her subsequent history is as detailed below.  INTERVAL HISTORY: [name] returnstoday for follow-up of her estrogen receptor positive breast cancer. She continues on anastrozole with excellent tolerance. She does not have hot flashes, vaginal dryness, or the arthralgias or myalgias that many patients can experience on this drug. She obtains it at approximately $3 a month.  REVIEW OF SYSTEMS: Lamis had her right knee done and  that is working well. She now needs to have her left knee done. This is keeping her from exercising. She describes herself is easily fatigued, and easily short of breath. She has a mild dry cough. She has back and joint pain which is not more intense or persistent than before. Her diabetes is well controlled. A detailed review of systems today was otherwise noncontributory  PAST MEDICAL HISTORY: Past Medical History  Diagnosis Date  . Bradycardia   . Iron deficiency anemia   . Hiatal hernia     large  . Diabetes mellitus     type 2  . Morbid obesity   . GERD (gastroesophageal reflux disease)   . Shortness of breath     with exertion  . Sinus drainage   . Breast cancer 03/16/12    right lumpectomy=high grade ductal ca in situ,ER/PR=positive  . Cancer     right breast ductal carcinoma in situ  . Hypertension     Echocardiogram 03/2011: EF 46-96%, grade 1 diastolic dysfunction, mild MAC, mild LAE  . Blood transfusion 03/14/12  . S/P radiation therapy 05/16/12 - 06/29/12    Right Breast: 50 Gray/ 25 Fractions with Boost: 10 Gray/ 5 Fractions  . Use of anastrozole (Arimidex) Started 10/13  . Chronic kidney disease     stage III   . Arthritis     PAST SURGICAL HISTORY: Past Surgical History  Procedure Laterality Date  . Breast lumpectomy  04/1997  . Biospy right breast  02/01/12    Right Breast Needle Core Biopsy: ductal Carcinoma In situ with Papillary Features, ER/PR Positive  . Lumpectomy right breast  03/16/12    Ductal carcinoma In situ: clear margins: 0/1 Node Negative  .  Total knee arthroplasty Right 05/15/2014    Procedure: RIGHT TOTAL KNEE ARTHROPLASTY;  Surgeon: Tobi Bastos, MD;  Location: [facility];  Service: Orthopedics;  Laterality: Right;   she had a left lumpectomy in 1998, benign.  FAMILY HISTORY Family History  Problem Relation Age of Onset  . Coronary artery disease Father     heart attack  . Cancer Sister     breast  . Lymphoma Sister     Deceased at age 78    the patient's father died from heart disease at the age of 71. The patient's mother is alive, as of July of 20164, being 66 years old. The patient has 2 brothers and 4 sisters. One sister was diagnosed with lymphoma at age 20, and died from that disease at age 46. Her daughter, the patient's niece, had pancreatic cancer diagnosed at age 58. Another niece was diagnosed with breast cancer at age 90, and a second sister of the patient was diagnosed with breast cancer at age 56, and survives. Another sister has been diagnosed with renal cell carcinoma, terminal stage.  GYNECOLOGIC HISTORY:  (Reviewed 02/15/2014)  Menarche age 68, last menstrual period age 62. The patient did not take hormone replacement. She is GX P1, first live birth age 3.  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 Her husband [name] is a retired used Barrister's clerk and currently drives a school bus. Son [name] lives in [place] and is an Aeronautical engineer. The patient has 2 biological grandchildren and 6 step grandchildren. She attends [employer] locally   ADVANCED DIRECTIVES: not in place  HEALTH MAINTENANCE:  (updated 02/15/2014)  History  Substance Use Topics  . Smoking status: Never Smoker   . Smokeless tobacco: Never Used  . Alcohol Use: No     Colonoscopy: July 2012/[facility]  PAP:2012/ [facility]  Bone density: 09/08/2012 at [facility], normal   Lipid panel:  Not on file    No Known Allergies  Current Outpatient Prescriptions  Medication Sig Dispense Refill  . anastrozole (ARIMIDEX) 1 MG tablet Take 1 tablet (1 mg total) by mouth daily. 90 tablet 3  . cetirizine (ZYRTEC) 10 MG tablet Take 10 mg by mouth daily as needed. For allergies.    [name] docusate sodium (COLACE) 100 MG capsule Take 1 capsule (100 mg total) by mouth 2 (two) times daily. 20 capsule 0  . HYDROcodone-acetaminophen (NORCO/VICODIN) 5-325 MG per tablet  Take 1-2 tablets by mouth every 4 (four) hours as needed (breakthrough pain). 60 tablet 0  . iron polysaccharides (POLY-IRON 150) 150 MG capsule Take 1 capsule (150 mg total) by mouth 2 (two) times daily. 60 capsule 3  . JANUMET XR 50-500 MG TB24 Take 1 tablet by mouth daily.     . methocarbamol (ROBAXIN) 500 MG tablet Take 1 tablet (500 mg total) by mouth every 6 (six) hours as needed for muscle spasms. 40 tablet 1  . olmesartan-hydrochlorothiazide (BENICAR HCT) 40-25 MG per tablet Take 1 tablet by mouth every morning.    [name] omeprazole (PRILOSEC) 20 MG capsule Take 20 mg by mouth daily as needed (Acid reflux). Alternates with prevacid depending upon cost For reflux    . rivaroxaban (XARELTO) 10 MG TABS tablet Take 1 tablet (10 mg total) by mouth daily with breakfast. 19 tablet 0   No current facility-administered medications for this visit.    OBJECTIVE: Middle-aged [place] American woman iwho appears stated age  Filed Vitals:   02/21/15 0905  BP: 151/93  Pulse: 96  Temp: 99 F (37.2 C)  Resp: 18     Body mass index is 41.82 kg/(m^2).    ECOG FS:1 Filed Weights   02/21/15 0905  Weight: 228 lb 11.2 oz (103.738 kg)   Sclerae unicteric, pupils round and equal Oropharynx clear and moist-- no thrush or other lesions No cervical or supraclavicular adenopathy Lungs no rales or rhonchi Heart regular rate and rhythm Abd soft, nontender, positive bowel sounds MSK no focal spinal tenderness, no upper extremity lymphedema Neuro: nonfocal, well oriented, appropriate affect Breasts: The right breast is status post lumpectomy and radiation. There is no evidence of local recurrence. The right axilla is benign.  LAB RESULTS: Results for KATLYN, MULDREW (MRN [id number]) as of 02/21/2015 09:14  Ref. Range 04/08/2011 13:10 04/09/2011 11:00 04/10/2011 04:40 04/11/2011 05:50 03/11/2012 09:31 03/14/2012 16:30 03/29/2012 16:18 04/08/2012 15:29 08/11/2012 11:53 08/18/2012 14:01 08/29/2012 10:11 10/06/2012 09:19 02/02/2013  11:27 02/08/2014 09:47 02/15/2014 09:57 04/12/2014 08:44 05/10/2014 10:00 05/16/2014 04:16 05/17/2014 04:25 02/14/2015 08:12  MCV Latest Ref Range: 79.5-101.0 fL 61.6 (L) 64.3 (L) 64.6 (L) 64.3 (L) 59.6 (L) 62.3 (L) 61.2 (L) 62.1 (L) 57.3 (L) 61.9 (L) 70.2 (L) 71.6 (L) 72.8 (L) 67.4 (L) 67.0 (L) 71.0 (L) 71.4 (L) 71.2 (L) 71.6 (L) 70.4 (L)    Lab Results  Component Value Date   WBC 5.2 02/14/2015   NEUTROABS 3.6 02/14/2015   HGB 11.9 02/14/2015   HCT 37.7 02/14/2015   MCV 70.4* 02/14/2015   PLT 272 02/14/2015      Chemistry      Component Value Date/Time   NA 141 02/14/2015 0811   NA 136* 05/17/2014 0425   K 3.9 02/14/2015 0811   K 4.1 05/17/2014 0425   CL 98 05/17/2014 0425   CL 104 02/02/2013 1127   CO2 25 02/14/2015 0811   CO2 26 05/17/2014 0425   BUN 22.1 02/14/2015 0811   BUN 35* 05/17/2014 0425   CREATININE 1.4* 02/14/2015 0811   CREATININE 1.68* 05/17/2014 0425      Component Value Date/Time   CALCIUM 9.7 02/14/2015 0811   CALCIUM 9.0 05/17/2014 0425   ALKPHOS 80 02/14/2015 0811   ALKPHOS 84 05/10/2014 1000   AST 13 02/14/2015 0811   AST 11 05/10/2014 1000   ALT 10 02/14/2015 0811   ALT 10 05/10/2014 1000   BILITOT 0.34 02/14/2015 0811   BILITOT 0.4 05/10/2014 1000        STUDIES:  Dg Bone Density  02/07/2015   CLINICAL DATA:  64 year old postmenopausal Caucasian female with history of breast cancer.  EXAM: DUAL X-RAY ABSORPTIOMETRY (DXA) FOR BONE MINERAL DENSITY  FINDINGS: AP LUMBAR SPINE L1-L4  Bone Mineral Density (BMD):  1.050 g/cm2  Young Adult T-Score:  0.0  Z-Score:  1.1  LEFT FEMUR NECK  Bone Mineral Density (BMD):  0.669 g/cm2  Young Adult T-Score: -1.6  Z-Score:  -0.7  ASSESSMENT: Patient's diagnostic category is LOW BONE MASS by WHO Criteria.  FRACTURE RISK: INCREASED  FRAX: Based on the [place] model, the 10 year probability of a major osteoporotic fracture is 3.7%. The 10 year probability of a hip fracture is 0.4%.  COMPARISON:  09/08/2012. Since the prior study, there has been a significant DECREASE in bone mineral density of the lumbar spine (-9.2% with a BMD change of -0.106) and of the left hip (-8% with a BMD change of - 0.077).  Effective therapies are available  in the form of bisphosphonates, selective estrogen receptor modulators, biologic agents, and hormone replacement therapy (for women). All patients should ensure an adequate intake of dietary calcium (1200 mg daily) and vitamin D (800 IU daily) unless contraindicated.  All treatment decisions require clinical judgment and consideration of individual patient factors, including patient preferences, co-morbidities, previous drug use, risk factors not captured in the FRAX model (e.g., frailty, falls, vitamin D deficiency, increased bone turnover, interval significant decline in bone density) and possible under- or over-estimation of fracture risk by FRAX.  The National Osteoporosis Foundation recommends that FDA-approved medical therapies be considered in postmenopausal women and men age 70 or older with a:  1. Hip or vertebral (clinical or morphometric) fracture.  2. T-score of -2.5 or lower at the spine or hip.  3. Ten-year fracture probability by FRAX of 3% or greater for hip fracture or 20% or greater for major osteoporotic fracture.  People with diagnosed cases of osteoporosis or at high risk for fracture should have regular bone mineral density tests. For patients eligible for Medicare, routine testing is allowed once every 2 years. The testing frequency can be increased to one year for patients who have rapidly progressing disease, those who are receiving or discontinuing medical therapy to restore bone mass, or have additional risk factors.  World [employer] [facility]) Criteria:  Normal: T-scores from +1.0 to -1.0  Low Bone Mass (Osteopenia): T-scores between -1.0 and -2.5  Osteoporosis: T-scores -2.5 and below  Comparison to Reference Population:  T-score is the key  measure used in the diagnosis of osteoporosis and relative risk determination for fracture. It provides a value for bone mass relative to the mean bone mass of a young adult reference population expressed in terms of standard deviation (SD).  Z-score is the age-matched score showing the patient's values compared to a population matched for age, sex, and race. This is also expressed in terms of standard deviation. The patient may have values that compare favorably to the age-matched values and still be at increased risk for fracture.   Electronically Signed   By: Margarette Canada M.D.   On: 02/07/2015 16:58   Mm Diag Breast Tomo Bilateral  02/07/2015   CLINICAL DATA:  Patient is an asymptomatic 64 year old female presents for annual mammography. She has a personal history of right breast cancer status post conservation therapy with lumpectomy and radiation in 2013. She is currently taking Arimidex.  EXAM: DIGITAL DIAGNOSTIC BILATERAL MAMMOGRAM WITH 3D TOMOSYNTHESIS AND CAD  COMPARISON:  05/24/2009 through 02/01/2014  ACR Breast Density Category b: There are scattered areas of fibroglandular density.  FINDINGS: Digital bilateral diagnostic mammography demonstrates scattered fibroglandular densities. Stable postsurgical scarring is identified in the posterior upper outer right breast at site of lumpectomy. Spot-compression magnification views of the lumpectomy bed demonstrates stable postsurgical scarring and effacement of normal fibroglandular tissue. There is minimal skin thickening of the right breast consistent with sequelae of radiation; this has decreased from comparison mammography. No new mass or suspicious microcalcification.  Mammographic images were processed with CAD.  IMPRESSION: Post lumpectomy scarring in the right breast without mammographic findings of malignancy.  RECOMMENDATION: Diagnostic mammogram is suggested in 1 year. (Code:DM-B-01Y)  I have discussed the findings and recommendations with the  patient. Results were also provided in writing at the conclusion of the visit. If applicable, a reminder letter will be sent to the patient regarding the next appointment.  BI-RADS CATEGORY  2: Benign.   Electronically Signed   By: Andres Shad  On: 02/07/2015 10:25      ASSESSMENT: 64 y.o. [place] woman   (1)  s/p Right lumpectomy and sentinel lymph node sampling 03/16/2012 for a 1.8 cm ductal carcinoma in situ, high-grade, strongly estrogen and progesterone receptor positive.  (2) additional resection for improved margins July 2013  (3) completed adjuvant irradiation 06/29/2012  (4) started anastrozole October 2013, the goal being to continue for total of 5 years (until October 2018).  (5)  Iron deficiency anemia, s/p IV iron 08/12/12, 10/13/2012, and 12/21/2013.  Also on oral iron supplementation.  (6) likely thalassemia: note MCV increased after feraheme replacement May 2015 from 67 to 71  (a) to document iron deficiency and thalassemia patient's a drop in the MCV, anemia, or ferritin level, or all 3 need to be documented   PLAN:  I spent approximately 40 minutes with Jenny Reichmann today going over her various problems. In general she is doing fine, as far as breast cancer is concerned she will continue on anastrozole through October 2018, at which time she will "graduate" from follow-up here.  She does have mild-to-moderate osteopenia. I am starting her on vitamin D 1000 mg daily. I have encouraged her to walk 45 minutes 5 times a week. She tells me she has problems with her right knee. She should get that taken care of then so that she can exercise appropriately.  Her MCV did not rise above 71 when she had iron infusions in 2015. This simply means she cannot make red cells any bigger than that. This indicates a thalassemia. We discussed that at length today. This requires no intervention, except for the fact that everyone will assume that she is iron deficient because she has small red  cells. 2 document iron deficiency in these patients it is necessary to obtain a ferritin, hemoglobin, and serial MCVs. Just in case I suggested she continue on her current iron supplementation, which she is tolerating well  She will see [place] again in one year. She knows to call for any problems that may develop before that visit.  Chauncey Cruel, MD    02/21/2015

## 2015-02-21 NOTE — Addendum Note (Signed)
Addended by: Laureen Abrahams on: 02/21/2015 05:01 PM   Modules accepted: Orders, Medications

## 2015-02-22 ENCOUNTER — Telehealth: Payer: Self-pay | Admitting: Oncology

## 2015-02-22 NOTE — Telephone Encounter (Signed)
Sent message to Mike Craze for survivor....emailed myself and Museum/gallery conservator for 2year appt with Dr. Jannifer Rodney

## 2015-03-05 ENCOUNTER — Other Ambulatory Visit: Payer: Self-pay | Admitting: *Deleted

## 2015-03-05 DIAGNOSIS — C50911 Malignant neoplasm of unspecified site of right female breast: Secondary | ICD-10-CM

## 2015-03-05 MED ORDER — ANASTROZOLE 1 MG PO TABS: 1.0000 mg | ORAL_TABLET | Freq: Every day | ORAL | Status: DC

## 2015-03-25 ENCOUNTER — Other Ambulatory Visit: Payer: Self-pay

## 2015-09-05 ENCOUNTER — Encounter: Payer: Self-pay | Admitting: Family Medicine

## 2015-09-05 LAB — HM DIABETES EYE EXAM

## 2016-01-20 ENCOUNTER — Other Ambulatory Visit: Payer: Self-pay | Admitting: Oncology

## 2016-01-20 DIAGNOSIS — Z853 Personal history of malignant neoplasm of breast: Secondary | ICD-10-CM

## 2016-01-20 DIAGNOSIS — Z9889 Other specified postprocedural states: Secondary | ICD-10-CM

## 2016-02-14 ENCOUNTER — Ambulatory Visit
Admission: RE | Admit: 2016-02-14 | Discharge: 2016-02-14 | Disposition: A | Discharge status: Home / Self Care | Payer: BLUE CROSS/BLUE SHIELD | Source: Ambulatory Visit | Attending: Oncology | Admitting: Oncology

## 2016-02-14 DIAGNOSIS — Z9889 Other specified postprocedural states: Secondary | ICD-10-CM

## 2016-02-14 DIAGNOSIS — R928 Other abnormal and inconclusive findings on diagnostic imaging of breast: Secondary | ICD-10-CM | POA: Diagnosis not present

## 2016-02-14 DIAGNOSIS — Z853 Personal history of malignant neoplasm of breast: Secondary | ICD-10-CM

## 2016-03-03 ENCOUNTER — Telehealth: Payer: Self-pay | Admitting: Nurse Practitioner

## 2016-03-03 NOTE — Telephone Encounter (Signed)
Not able to reach patient re 6/7 LTS visit or leave message - patient mychart active.

## 2016-03-05 ENCOUNTER — Encounter: Payer: BLUE CROSS/BLUE SHIELD | Admitting: Nurse Practitioner

## 2016-04-08 NOTE — Progress Notes (Signed)
ROS   CLINIC:  Survivorship   REASON FOR VISIT:  Routine follow-up for history of breast cancer.   BRIEF ONCOLOGIC HISTORY:    Breast cancer, right breast ([place])   03/16/2012 Definitive Surgery Right lumpectomy/SLNB: DCIS, 1.8 cm, high grade, ER+, PR+   03/16/2012 Pathologic Stage Stage 0: Tis N0   03/2012 Surgery re-excision of margins   05/16/2012 - 06/29/2012 Radiation Therapy Adjuvant RT to right breast Isidore Moos). Right breast/ 50 gray in 25 fractions. Right breast boost /10 gray in 5 fractions    06/2012 -  Anti-estrogen oral therapy Anastrozole 1 mg; Planned duration of therapy: 5 years ([place])   02/07/2015 Imaging DEXA scan: Osteopenia. T-score -1.6   02/14/2016 Mammogram No mammographic evidence of malignancy in either breast. Status post right lumpectomy.   INTERVAL HISTORY:  Ms. Gerads presents to the Survivorship Clinic today for routine follow-up for her history of breast cancer.  Overall, she reports feeling pretty well.  She is taking the anastrazole as scheduled and needs a refill today.  Her PCP, Dr. Baird Cancer, is managing her anemia and she reports recently having blood work done.  She needs a refill on her iron supplementation and would like [place] to provide that for her today.  She has plans to see Dr. Baird Cancer again in 3 months.  She denies any frank bleeding or blood in her stools.  Ms. Chandley described an episode a few months ago where she experienced double vision, "there was 2 of everything sitting on top of each other."  She saw an eye doctor and another specialist (she cannot recall if it was a neurologist or not) who could not find the cause; it resolved in its own after about 2-3 days. She has had no further symptoms like this.    REVIEW OF SYSTEMS:  Review of Systems  Constitutional: Positive for malaise/fatigue.       (+) fatigue  HENT: Negative for sore throat.   Eyes: Negative for blurred vision.       -Episode of double vision as described in HPI. Otherwise no  complaints.   Respiratory: Positive for shortness of breath. Negative for cough.        Some SOB with exertion (going up stairs); does not participate in regular exercise  Cardiovascular: Negative for chest pain and palpitations.  Gastrointestinal: Positive for heartburn and vomiting.       Reports occasional vomiting, which she relates to heartburn; also reportedly has hiatal hernia  Genitourinary: Negative for dysuria. Musculoskeletal: Positive for joint pain.       (L) knee pain  Skin: Negative for rash. Neurological: Negative for dizziness and headaches. Endo/Heme/Allergies:       Has diabetes and anemia; being managed by PCP  Psychiatric/Behavioral: Negative for depression. The patient is not nervous/anxious.    GU: Denies vaginal bleeding, discharge, or dryness.  Breast: Denies any new nodularity, masses, tenderness, nipple changes, or nipple discharge.   A 14-point review of systems was completed and was negative, except as noted above.   PAST MEDICAL/SURGICAL HISTORY:  Past Medical History  Diagnosis Date  . Bradycardia   . Iron deficiency anemia   . Hiatal hernia     large  . Diabetes mellitus     type 2  . Morbid obesity   . GERD (gastroesophageal reflux disease)   . Shortness of breath     with exertion  . Sinus drainage   . Breast cancer 03/16/12    right lumpectomy=high grade ductal ca in situ,ER/PR=positive  .  Cancer     right breast ductal carcinoma in situ  . Hypertension     Echocardiogram 03/2011: EF 0000000, grade 1 diastolic dysfunction, mild MAC, mild LAE  . Blood transfusion 03/14/12  . S/P radiation therapy 05/16/12 - 06/29/12    Right Breast: 50 Gray/ 25 Fractions with Boost: 10 Gray/ 5 Fractions  . Use of anastrozole (Arimidex) Started 10/13  . Chronic kidney disease     stage III   . Arthritis    Past Surgical History  Procedure Laterality Date  . Breast lumpectomy  04/1997  . Biospy right breast  02/01/12    Right Breast Needle Core Biopsy:  ductal Carcinoma In situ with Papillary Features, ER/PR Positive  . Lumpectomy right breast  03/16/12    Ductal carcinoma In situ: clear margins: 0/1 Node Negative  . Total knee arthroplasty Right 05/15/2014    Procedure: RIGHT TOTAL KNEE ARTHROPLASTY;  Surgeon: Tobi Bastos, MD;  Location: [facility];  Service: Orthopedics;  Laterality: Right;    ALLERGIES:  No Known Allergies   CURRENT MEDICATIONS:  Current Outpatient Prescriptions on File Prior to Visit  Medication Sig Dispense Refill  . anastrozole (ARIMIDEX) 1 MG tablet Take 1 tablet (1 mg total) by mouth daily. 90 tablet 3  . Ascorbic Acid (VITAMIN C) 1000 MG tablet Take 1,000 mg by mouth daily.    . cetirizine (ZYRTEC) 10 MG tablet Take 10 mg by mouth daily as needed. For allergies.    . ferrous fumarate (HEMOCYTE - 106 MG FE) 325 (106 FE) MG TABS tablet Take 1 tablet by mouth.    [name] JANUMET XR 50-500 MG TB24 Take 1 tablet by mouth daily.     [name] olmesartan-hydrochlorothiazide (BENICAR HCT) 40-25 MG per tablet Take 1 tablet by mouth every morning.    [name] omeprazole (PRILOSEC) 20 MG capsule Take 20 mg by mouth daily as needed (Acid reflux). Alternates with prevacid depending upon cost For reflux    . valsartan-hydrochlorothiazide (DIOVAN-HCT) 320-25 MG per tablet   2   No current facility-administered medications on file prior to visit.    ONCOLOGIC FAMILY HISTORY:  Family History  Problem Relation Age of Onset  . Coronary artery disease Father     heart attack  . Cancer Sister     breast  . Lymphoma Sister     Deceased at age 69    GENETIC COUNSELING/TESTING: None.  SOCIAL HISTORY:  Ms. Albro is married and lives in [place], [place].  She works at the [employer].  She denies any current or history of tobacco, alcohol, or illicit drug use.    PHYSICAL EXAMINATION:  Vital Signs: Filed Vitals:   04/09/16 0907  BP: 161/93  Pulse: 65  Temp: 98.2 F (36.8 C)  Resp: 18   Filed Weights   04/09/16 0907    Weight: 229 lb 3.2 oz (103.964 kg)   General: Well-nourished, well-appearing female in no acute distress.  She is unaccompanied today.   HEENT: Head is normocephalic.  Pupils equal and reactive to light and accomodation. Conjunctivae clear without exudate.  Sclerae anicteric. Oral mucosa is pink, moist.  Oropharynx is pink without lesions or erythema.  Lymph: No cervical, supraclavicular, or infraclavicular lymphadenopathy noted on palpation.  Cardiovascular: Regular rate and rhythm.[name] Respiratory: Clear to auscultation bilaterally. Chest expansion symmetric; breathing non-labored.  Breast Exam:  -Right breast: No appreciable masses on palpation. Expected mild fibrosis given history of breast irradiation. Skin on breast is intact; healed scar without erythema or  nodularity. -Left breast: No appreciable masses on palpation. Fibrocystic changes noted. Skin on breast is intact. -Axilla: No axillary adenopathy bilaterally.  GI: Abdomen soft and round; non-tender, non-distended. Bowel sounds normoactive. No hepatosplenomegaly.   GU: Deferred.  Musculoskeletal: Muscle strength 5/5 in all extremities.  Full ROM noted in all extremities.  Neuro: No focal deficits. Steady gait.  Psych: Mood and affect normal and appropriate for situation.  Extremities: No edema. Skin: Warm and dry.  LABORATORY DATA:  None for this visit.   DIAGNOSTIC IMAGING:  Most recent mammogram-02/14/16 IMPRESSION: No mammographic evidence of malignancy in either breast. Status post right lumpectomy.   ASSESSMENT AND PLAN:  Ms.. Breitwieser is a pleasant 65 y.o. female with Stage 0 right breast DCIS, ER/PR+, diagnosed in 2013, treated with lumpectomy, adjuvant radiation therapy, and anti-estrogen therapy with Anastrazole (beginning in 06/2012).  She presents to the Survivorship Clinic for surveillance and routine follow-up.   1. History of Stage 0 right breast cancer:  Ms. Frevert is currently clinically and radiographically  without evidence of diease or recurrence of breast cancer. She will follow-up with her medical oncologist, Dr. Jana Hakim, in 11 months (02/2017) for physical exam per surveillance protocol.  She will continue her endocrine therapy with Anastrazole as prescribed by Dr. Jana Hakim.  I provided a handwritten paper prescription for her Anastrazole refill (Anastrazole 1 mg po daily, #90, 4 refills), as she has a pharmacy plan through her work that is cheaper and she requested paper prescriptions. She was instructed to make me aware if she begins to experience any side effects of the medication and I would see her back in clinic to help manage those side effects, as needed.  She will be due for her diagnostic mammogram at the [facility] in 01/2017; orders were placed today for mammogram.   2. Anemia: Per Ms. Jerline Pain, her PCP is managing her anemia. However, she requested a refill of her iron supplementation.  She has plans to see her PCP again in 3 months and is out of her prescription iron pills. I provided a refill for her today.  Again, she requested a paper prescription based on her pharmacy at work where she is able to get her medications cheaper, which was given.   3. Bone health:  Given Ms. Larr history of breast cancer and her current treatment regimen including chemoprevention with anastrazole, she is at risk for bone demineralization.  Her last DEXA scan was on 02/07/15 revealing osteopenia.  She should be on Vitamin D supplementation, which was prescribed by Dr. Jana Hakim at her last visit in 01/2015; she did not bring an updated list of medications today and is unsure if her med list is accurate.  She was encouraged increase her weight-bearing activities, like walking, to further benefit her bone health.      Dispo:  -Return to cancer center to see Dr. Jana Hakim in 02/2017.  -Mammogram at [facility] in 01/2017; orders placed today.  -Encouraged patient to call me with any questions or  concerns before her next visit at the cancer center.   A total of 20 minutes of face-to-face time was spent with this patient with greater than 50% of that time in counseling and care-coordination.   Mike Craze, NP [facility] [place] [phone]   Note: PRIMARY CARE PROVIDER Maximino Greenland, MD [phone]

## 2016-04-09 ENCOUNTER — Ambulatory Visit (HOSPITAL_BASED_OUTPATIENT_CLINIC_OR_DEPARTMENT_OTHER): Payer: BLUE CROSS/BLUE SHIELD | Admitting: Adult Health

## 2016-04-09 ENCOUNTER — Telehealth: Payer: Self-pay | Admitting: Adult Health

## 2016-04-09 VITALS — BP 161/93 | HR 65 | Temp 98.2°F | Resp 18 | Ht 62.0 in | Wt 229.2 lb

## 2016-04-09 DIAGNOSIS — C50911 Malignant neoplasm of unspecified site of right female breast: Secondary | ICD-10-CM

## 2016-04-09 DIAGNOSIS — D649 Anemia, unspecified: Secondary | ICD-10-CM | POA: Diagnosis not present

## 2016-04-09 DIAGNOSIS — D0511 Intraductal carcinoma in situ of right breast: Secondary | ICD-10-CM | POA: Diagnosis not present

## 2016-04-09 DIAGNOSIS — M8580 Other specified disorders of bone density and structure, unspecified site: Secondary | ICD-10-CM

## 2016-04-09 NOTE — Telephone Encounter (Signed)
appts made and avs printed °

## 2016-07-02 ENCOUNTER — Encounter: Payer: Self-pay | Admitting: Family Medicine

## 2016-07-02 LAB — HM DIABETES EYE EXAM

## 2016-07-27 DIAGNOSIS — H1031 Unspecified acute conjunctivitis, right eye: Secondary | ICD-10-CM | POA: Diagnosis not present

## 2017-02-18 ENCOUNTER — Other Ambulatory Visit (HOSPITAL_COMMUNITY): Payer: Self-pay

## 2017-02-18 ENCOUNTER — Ambulatory Visit
Admission: RE | Admit: 2017-02-18 | Discharge: 2017-02-18 | Disposition: A | Discharge status: Home / Self Care | Payer: BLUE CROSS/BLUE SHIELD | Source: Ambulatory Visit | Attending: Adult Health | Admitting: Adult Health

## 2017-02-18 ENCOUNTER — Other Ambulatory Visit: Payer: Self-pay | Admitting: Adult Health

## 2017-02-18 DIAGNOSIS — R928 Other abnormal and inconclusive findings on diagnostic imaging of breast: Secondary | ICD-10-CM | POA: Diagnosis not present

## 2017-02-18 DIAGNOSIS — C50911 Malignant neoplasm of unspecified site of right female breast: Secondary | ICD-10-CM

## 2017-02-18 DIAGNOSIS — N6042 Mammary duct ectasia of left breast: Secondary | ICD-10-CM | POA: Diagnosis not present

## 2017-03-10 ENCOUNTER — Other Ambulatory Visit: Payer: Self-pay

## 2017-03-10 DIAGNOSIS — C50911 Malignant neoplasm of unspecified site of right female breast: Secondary | ICD-10-CM

## 2017-03-11 ENCOUNTER — Other Ambulatory Visit: Payer: BLUE CROSS/BLUE SHIELD

## 2017-03-11 ENCOUNTER — Ambulatory Visit: Payer: BLUE CROSS/BLUE SHIELD | Admitting: Oncology

## 2017-03-25 DIAGNOSIS — M1712 Unilateral primary osteoarthritis, left knee: Secondary | ICD-10-CM | POA: Diagnosis not present

## 2017-03-25 DIAGNOSIS — S838X2A Sprain of other specified parts of left knee, initial encounter: Secondary | ICD-10-CM | POA: Diagnosis not present

## 2017-04-12 ENCOUNTER — Encounter: Payer: Self-pay | Admitting: Oncology

## 2017-04-12 ENCOUNTER — Ambulatory Visit (HOSPITAL_BASED_OUTPATIENT_CLINIC_OR_DEPARTMENT_OTHER): Payer: BLUE CROSS/BLUE SHIELD | Admitting: Oncology

## 2017-04-12 ENCOUNTER — Other Ambulatory Visit (HOSPITAL_BASED_OUTPATIENT_CLINIC_OR_DEPARTMENT_OTHER): Payer: BLUE CROSS/BLUE SHIELD

## 2017-04-12 DIAGNOSIS — D569 Thalassemia, unspecified: Secondary | ICD-10-CM

## 2017-04-12 DIAGNOSIS — C50911 Malignant neoplasm of unspecified site of right female breast: Secondary | ICD-10-CM

## 2017-04-12 DIAGNOSIS — D0511 Intraductal carcinoma in situ of right breast: Secondary | ICD-10-CM | POA: Insufficient documentation

## 2017-04-12 DIAGNOSIS — Z86000 Personal history of in-situ neoplasm of breast: Secondary | ICD-10-CM | POA: Diagnosis not present

## 2017-04-12 DIAGNOSIS — D560 Alpha thalassemia: Secondary | ICD-10-CM | POA: Diagnosis not present

## 2017-04-12 HISTORY — DX: Intraductal carcinoma in situ of right breast: D05.11

## 2017-04-12 LAB — COMPREHENSIVE METABOLIC PANEL
ALBUMIN: 3.3 g/dL — AB (ref 3.5–5.0)
ALT: 25 U/L (ref 0–55)
AST: 15 U/L (ref 5–34)
Alkaline Phosphatase: 67 U/L (ref 40–150)
Anion Gap: 8 mEq/L (ref 3–11)
BUN: 20.5 mg/dL (ref 7.0–26.0)
CHLORIDE: 105 meq/L (ref 98–109)
CO2: 30 meq/L — AB (ref 22–29)
Calcium: 9.8 mg/dL (ref 8.4–10.4)
Creatinine: 1.2 mg/dL — ABNORMAL HIGH (ref 0.6–1.1)
EGFR: 55 mL/min/{1.73_m2} — AB (ref >90)
Glucose: 70 mg/dl (ref 70–140)
Potassium: 4.2 mEq/L (ref 3.5–5.1)
SODIUM: 143 meq/L (ref 136–145)
Total Bilirubin: 0.53 mg/dL (ref 0.20–1.20)
Total Protein: 7 g/dL (ref 6.4–8.3)

## 2017-04-12 LAB — CBC WITH DIFFERENTIAL/PLATELET
BASO%: 0.4 % (ref 0.0–2.0)
Basophils Absolute: 0 10*3/uL (ref 0.0–0.1)
EOS%: 2.9 % (ref 0.0–7.0)
Eosinophils Absolute: 0.1 10*3/uL (ref 0.0–0.5)
HCT: 37.7 % (ref 34.8–46.6)
HGB: 11.4 g/dL — ABNORMAL LOW (ref 11.6–15.9)
LYMPH%: 22.6 % (ref 14.0–49.7)
MCH: 22.9 pg — ABNORMAL LOW (ref 25.1–34.0)
MCHC: 30.2 g/dL — ABNORMAL LOW (ref 31.5–36.0)
MCV: 75.9 fL — ABNORMAL LOW (ref 79.5–101.0)
MONO#: 0.4 10*3/uL (ref 0.1–0.9)
MONO%: 9 % (ref 0.0–14.0)
NEUT#: 3.2 10*3/uL (ref 1.5–6.5)
NEUT%: 65.1 % (ref 38.4–76.8)
Platelets: 183 10*3/uL (ref 145–400)
RBC: 4.97 10*6/uL (ref 3.70–5.45)
RDW: 17.4 % — ABNORMAL HIGH (ref 11.2–14.5)
WBC: 4.9 10*3/uL (ref 3.9–10.3)
lymph#: 1.1 10*3/uL (ref 0.9–3.3)

## 2017-04-12 NOTE — Progress Notes (Signed)
ID: [name]   DOB: Aug 06, 1951  MR#: [id number]  DGL#:[id number]  PCP: Glendale Chard, MD GYN: SU: Donnie Mesa MD OTHER MD: Eppie Gibson, MD;  Laurence Spates, MD  CHIEF COMPLAINT: Estrogen receptor positive breast cancer  CURRENT TREATMENT: Completing 5 years of Anastrozole  HISTORY OF PRESENT ILLNESS: From the original intake note:  [name] had routine screening mammography at the breast center 01/21/2012. This showed a possible mass in the right breast. Additional views were obtained may 06/17/2012 and these confirmed an irregular mass in the upper outer right breast, which was palpable. Ultrasound showed a solid mass measuring 1.2 cm with a dilated duct extending medially from the mass. The right axilla was unremarkable by ultrasound.  Biopsy of the right breast mass on 02/11/2012 showed ([id number]) ductal carcinoma in situ, involving a papilloma. Estrogen receptor was 100% positive. Progesterone receptor was 92% positive. Breast MRI was obtained 02/19/2012. This confirmed an oval nonenhancing mass with lobulated margins in the upper outer quadrant of the right breast measuring 1.4 cm, with no additional suspicious masses and no axillary or internal mammary adenopathy.  Accordingly on 03/16/2012 the patient underwent right lumpectomy with sentinel lymph node sampling. This showed ([id number]) ductal carcinoma in situ, high-grade, measuring 1.8 cm, with negative but close margins, the closest being 1 mm. The sentinel lymph node was negative. The patient tolerated the surgery well.   Her subsequent history is as detailed below.  INTERVAL HISTORY: [name] returns today for evaluation and treatment of her estrogen receptor positive noninvasive breast cancer. Interval history is generally unremarkable. She is currently obtaining the anastrozole at. She has had no problems with hot flashes, vaginal dryness, or other concerns regarding this medication.  REVIEW OF SYSTEMS: [name] continues  to work full time. She also drives to [place] most weekends to help care for her 46 year old mother. [name] has some knee and back problems, which make it difficult for her to walk up stairs easily. She has a hiatal hernia. Otherwise a detailed review of systems today was stable  PAST MEDICAL HISTORY: Past Medical History:  Diagnosis Date  . Arthritis   . Blood transfusion 03/14/12  . Bradycardia   . Breast cancer ([place]) 03/16/12   right lumpectomy=high grade ductal ca in situ,ER/PR=positive  . Cancer ([place])    right breast ductal carcinoma in situ  . Chronic kidney disease    stage III   . Diabetes mellitus    type 2  . GERD (gastroesophageal reflux disease)   . Hiatal hernia    large  . Hypertension    Echocardiogram 03/2011: EF 16-01%, grade 1 diastolic dysfunction, mild MAC, mild LAE  . Iron deficiency anemia   . Morbid obesity ([place])   . S/P radiation therapy 05/16/12 - 06/29/12   Right Breast: 50 Gray/ 25 Fractions with Boost: 10 Gray/ 5 Fractions  . Shortness of breath    with exertion  . Sinus drainage   . Use of anastrozole (Arimidex) Started 10/13    PAST SURGICAL HISTORY: Past Surgical History:  Procedure Laterality Date  . Biospy Right Breast  02/01/12   Right Breast Needle Core Biopsy: ductal Carcinoma In situ with Papillary Features, ER/PR Positive  . BREAST EXCISIONAL BIOPSY     left 1998  . BREAST LUMPECTOMY  04/1997  . Lumpectomy Right Breast  03/16/12   Ductal carcinoma In situ: clear margins: 0/1 Node Negative  . TOTAL KNEE ARTHROPLASTY Right 05/15/2014   Procedure: RIGHT TOTAL KNEE ARTHROPLASTY;  Surgeon: Jori Moll  Fransico Setters, MD;  Location: [facility];  Service: Orthopedics;  Laterality: Right;   she had a left lumpectomy in 1998, benign.  FAMILY HISTORY Family History  Problem Relation Age of Onset  . Coronary artery disease Father        heart attack  . Cancer Sister        breast  . Lymphoma Sister        Deceased at age 67   the patient's father died from  heart disease at the age of 68. The patient's mother is alive, as of July of 20154, being 68 years old. The patient has 2 brothers and 4 sisters. One sister was diagnosed with lymphoma at age 12, and died from that disease at age 57. Her daughter, the patient's niece, had pancreatic cancer diagnosed at age 52. Another niece was diagnosed with breast cancer at age 15, and a second sister of the patient was diagnosed with breast cancer at age 36, and survives. Another sister has been diagnosed with renal cell carcinoma, terminal stage.  GYNECOLOGIC HISTORY:  (Reviewed 02/15/2014)  Menarche age 66, last menstrual period age 71. The patient did not take hormone replacement. She is GX P1, first live birth age 63.  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 Her husband [name] Kitchen is a retired used Barrister's clerk and currently drives a school bus. Son [name] lives in [place] and is an Aeronautical engineer. The patient has 2 biological grandchildren and 6 step grandchildren. She attends [employer] locally   ADVANCED DIRECTIVES: not in place  HEALTH MAINTENANCE:  (updated 02/15/2014)  Social History  Substance Use Topics  . Smoking status: Never Smoker  . Smokeless tobacco: Never Used  . Alcohol use No     Colonoscopy: July 2012/[facility]  PAP:2012/ [facility]  Bone density: 09/08/2012 at [facility], normal   Lipid panel:  Not on file    No Known Allergies  Current Outpatient Prescriptions  Medication Sig Dispense Refill  . anastrozole (ARIMIDEX) 1 MG tablet Take 1 tablet (1 mg total) by mouth daily. 90 tablet 3  . Ascorbic Acid (VITAMIN C) 1000 MG tablet Take 1,000 mg by mouth daily.    . cetirizine (ZYRTEC) 10 MG tablet Take 10 mg by mouth daily as needed. For allergies.    . cloNIDine (CATAPRES) 0.1 MG tablet Take 0.1 mg by mouth 2 (two) times daily. [Patient unsure of dose or how often she takes  it]-gwd    . ferrous fumarate (HEMOCYTE - 106 MG FE) 325 (106 FE) MG TABS tablet Take 1 tablet by mouth.    . hydrochlorothiazide (MICROZIDE) 12.5 MG capsule Take 12.5 mg by mouth daily.    [name] Kitchen JANUMET XR 50-500 MG TB24 Take 1 tablet by mouth daily.     [name] Kitchen olmesartan-hydrochlorothiazide (BENICAR HCT) 40-25 MG per tablet Take 1 tablet by mouth every morning.    [name] Kitchen omeprazole (PRILOSEC) 20 MG capsule Take 20 mg by mouth daily as needed (Acid reflux). Alternates with prevacid depending upon cost For reflux    . valsartan-hydrochlorothiazide (DIOVAN-HCT) 320-25 MG per tablet   2   No current facility-administered medications for this visit.     OBJECTIVE: Middle-aged [place] American woman In no acute distress  Vitals:   04/12/17 0952  BP: (!) 146/84  Pulse: 80  Resp: 18  Temp: 97.9 F (36.6 C)     Body mass index is 44.63 kg/m.  ECOG FS:1 Filed Weights   04/12/17 0952  Weight: 244 lb (110.7 kg)   Sclerae unicteric, EOMs intact Oropharynx clear and moist No cervical or supraclavicular adenopathy Lungs no rales or rhonchi Heart regular rate and rhythm Abd soft, nontender, positive bowel sounds MSK no focal spinal tenderness, no upper extremity lymphedema Neuro: nonfocal, well oriented, appropriate affect Breasts: The right breast is status post lumpectomy and radiation. There is no evidence of local recurrence. The left breast is unremarkable. Both axillae are benign.   LAB RESULTS: Results for MARYNELL, BIES (MRN [id number]) as of 02/21/2015 09:14  Ref. Range 04/08/2011 13:10 04/09/2011 11:00 04/10/2011 04:40 04/11/2011 05:50 03/11/2012 09:31 03/14/2012 16:30 03/29/2012 16:18 04/08/2012 15:29 08/11/2012 11:53 08/18/2012 14:01 08/29/2012 10:11 10/06/2012 09:19 02/02/2013 11:27 02/08/2014 09:47 02/15/2014 09:57 04/12/2014 08:44 05/10/2014 10:00 05/16/2014 04:16 05/17/2014 04:25 02/14/2015 08:12  MCV Latest Ref Range: 79.5-101.0 fL 61.6 (L) 64.3 (L) 64.6 (L) 64.3 (L) 59.6 (L) 62.3 (L) 61.2 (L) 62.1 (L) 57.3  (L) 61.9 (L) 70.2 (L) 71.6 (L) 72.8 (L) 67.4 (L) 67.0 (L) 71.0 (L) 71.4 (L) 71.2 (L) 71.6 (L) 70.4 (L)    Lab Results  Component Value Date   WBC 4.9 04/12/2017   NEUTROABS 3.2 04/12/2017   HGB 11.4 (L) 04/12/2017   HCT 37.7 04/12/2017   MCV 75.9 (L) 04/12/2017   PLT 183 04/12/2017      Chemistry      Component Value Date/Time   NA 143 04/12/2017 0939   K 4.2 04/12/2017 0939   CL 98 05/17/2014 0425   CL 104 02/02/2013 1127   CO2 30 (H) 04/12/2017 0939   BUN 20.5 04/12/2017 0939   CREATININE 1.2 (H) 04/12/2017 0939      Component Value Date/Time   CALCIUM 9.8 04/12/2017 0939   ALKPHOS 67 04/12/2017 0939   AST 15 04/12/2017 0939   ALT 25 04/12/2017 0939   BILITOT 0.53 04/12/2017 0939        STUDIES: Bilateral diagnostic mammography with tomography 02/18/2017 and left breast ultrasonography showed a dilated duct in the subareolar left breast but no mammographic evidence of malignancy. The breast density was category B.   ASSESSMENT: 66 y.o. [place] woman   (1)  s/p Right lumpectomy and sentinel lymph node sampling 03/16/2012 for a 1.8 cm ductal carcinoma in situ, high-grade, strongly estrogen and progesterone receptor positive.  (2) additional resection for improved margins July 2013  (3) completed adjuvant irradiation 06/29/2012  (4) started anastrozole October 2013,  completing 5 years the summer of 2018  (5)  Iron deficiency anemia, s/p IV iron 08/12/12, 10/13/2012, and 12/21/2013.  Also on oral iron supplementation.  (6) likely thalassemia: note MCV increased after feraheme replacement May 2015 from 67 to 71  (a) to document iron deficiency and thalassemia patient's a drop in the MCV, anemia, or ferritin level, or all 3 need to be documented   PLAN:  Whidby is now a little over 5 years out from definitive surgery for her breast cancer with no evidence of disease recurrence. This is very favorable.  She is completing 5 years of anastrozole the summer. We  discussed the fact that in invasive disease sometimes we continue and thought estrogen beyond 5 years. However in noninvasive disease like hers we do not have data for that. Accordingly we are stopping her anti-estrogens as soon as she runs out of her current supply.  We also discussed her thalassemia. She understands this means she cannot make normal-sized red cells. This is unrelated to iron  deficiency although she did have iron deficiency in addition at one time. The point of discussing this today was so that her doctors may not think she is iron deficient simply because of the size of her white cells. To document iron deficiency in her case will require a ferritin and total iron-binding and serum iron study  At this point I'm comfortable releasing her to her primary care physician. All she will need in terms of breast cancer follow-up is a yearly mammogram and a yearly physician breast exam  I will be glad to see Tru[place] at any point in the future if on when the need arises but as of now are making no further appointment for her here.  Chauncey Cruel, MD    04/12/2017

## 2017-06-04 ENCOUNTER — Other Ambulatory Visit: Payer: Self-pay | Admitting: Family Medicine

## 2017-06-04 ENCOUNTER — Ambulatory Visit
Admission: RE | Admit: 2017-06-04 | Discharge: 2017-06-04 | Disposition: A | Discharge status: Home / Self Care | Payer: BLUE CROSS/BLUE SHIELD | Source: Ambulatory Visit | Attending: Family Medicine | Admitting: Family Medicine

## 2017-06-04 DIAGNOSIS — R0609 Other forms of dyspnea: Principal | ICD-10-CM

## 2017-06-04 DIAGNOSIS — R0602 Shortness of breath: Secondary | ICD-10-CM | POA: Diagnosis not present

## 2017-06-04 DIAGNOSIS — R0600 Dyspnea, unspecified: Secondary | ICD-10-CM

## 2017-09-17 ENCOUNTER — Encounter: Payer: Self-pay | Admitting: Family Medicine

## 2017-09-17 LAB — HM DIABETES EYE EXAM

## 2017-10-14 DIAGNOSIS — M1712 Unilateral primary osteoarthritis, left knee: Secondary | ICD-10-CM | POA: Diagnosis not present

## 2017-11-11 ENCOUNTER — Encounter: Payer: Self-pay | Admitting: Internal Medicine

## 2017-11-11 ENCOUNTER — Ambulatory Visit: Payer: BLUE CROSS/BLUE SHIELD | Admitting: Internal Medicine

## 2017-11-11 VITALS — BP 130/84 | HR 120 | Ht 62.0 in | Wt 252.0 lb

## 2017-11-11 DIAGNOSIS — R000 Tachycardia, unspecified: Secondary | ICD-10-CM

## 2017-11-11 DIAGNOSIS — R0609 Other forms of dyspnea: Secondary | ICD-10-CM

## 2017-11-11 DIAGNOSIS — E119 Type 2 diabetes mellitus without complications: Secondary | ICD-10-CM

## 2017-11-11 DIAGNOSIS — I10 Essential (primary) hypertension: Secondary | ICD-10-CM

## 2017-11-11 DIAGNOSIS — R0600 Dyspnea, unspecified: Secondary | ICD-10-CM

## 2017-11-11 NOTE — Patient Instructions (Addendum)
Medication Instructions:  Your physician recommends that you continue on your current medications as directed. Please refer to the Current Medication list given to you today.  -- If you need a refill on your cardiac medications before your next appointment, please call your pharmacy. --  Labwork: None ordered  Testing/Procedures: Your physician has requested that you have an echocardiogram.  Please schedule to be done in a week or two...   Echocardiography is a painless test that uses sound waves to create images of your heart. It provides your doctor with information about the size and shape of your heart and how well your heart's chambers and valves are working. This procedure takes approximately one hour. There are no restrictions for this procedure.   Follow-Up: Your physician wants you to follow-up in: 1 month with Dr. Saunders Revel.    Thank you for choosing [facility]!!    Any Other Special Instructions Will Be Listed Below (If Applicable).

## 2017-11-11 NOTE — Progress Notes (Signed)
New Outpatient Visit Date: 11/11/2017  Referring Provider: Leonides [name], [place], [place] [address]  Chief Complaint: Shortness of breath  HPI:  Ms. [name] is a 67 y.o. female who is being seen today for the evaluation of shortness of breath and abnormal EKG at the request of [name], Andrea L, MD. She has a history of just a history of hypertension, hyperlipidemia, type 2 diabetes mellitus, anemia attributed to a combination of iron deficiency and thalassemia, GERD, chronic kidney disease stage III, and right breast cancer. She was seen by Dr. Lisbeth [name] on 09/27/17, which time she noted exertional dyspnea. EKG showed left anterior fascicular block.  Today, Ms. [name] feels well. However, she notes dyspnea on exertion for the last 5-6 months. She has attributed this to deconditioning, as knee pain has been limiting her activity. This is most noticeable when walking extended distances. She denies significant weight gain. She notes occasional dependent edema in her feet but no persistent edema. She denies chest pain, lightheadedness, orthopnea, and PND. Ms. [name] occasionally feels like her heart is racing, typically when sitting or lying still. There are no associated symptoms. She notes that her resting heart rate at home is usually 90-100 bpm. She used some diet pills in the past but does not recall the name.  Ms. [name] denies a history of prior heart disease. She was evaluated by Dr. Lia [name] in 2012 after an ED visit for shoulder pain that was thought to represent possible atypical angina. She subsequently underwent an exercise tolerance test that year, which showed poor exercise capacity but no worrisome EKG changes.  --------------------------------------------------------------------------------------------------  Cardiovascular History & Procedures: Cardiovascular Problems:  Dyspnea on exertion  Risk Factors:  Hypertension, hyperlipidemia, diabetes mellitus,  and age greater than 51  Cath/PCI:  None  CV Surgery:  None  EP Procedures and Devices:  None  Non-Invasive Evaluation(s):  ETT (03/01/12): Low risk study. No EKG changes. Poor exercise tolerance.  Recent CV Pertinent Labs: Lab Results  Component Value Date   CHOL 172 04/09/2011   HDL 51 04/09/2011   LDLCALC 106 (H) 04/09/2011   TRIG 76 04/09/2011   CHOLHDL 3.4 04/09/2011   INR 1.04 05/10/2014   K 4.2 04/12/2017   BUN 20.5 04/12/2017   CREATININE 1.2 (H) 04/12/2017    --------------------------------------------------------------------------------------------------  Past Medical History:  Diagnosis Date  . Abnormal electrocardiogram (ECG) (EKG)   . Arthritis   . Blood transfusion 03/14/12  . Bradycardia   . Breast cancer ([facility]) 03/16/12   right lumpectomy=high grade ductal ca in situ,ER/PR=positive  . Cancer ([facility])    right breast ductal carcinoma in situ  . Chronic kidney disease    stage III   . Diabetes mellitus    type 2  . Dyspnea   . Edema   . GERD (gastroesophageal reflux disease)   . Hiatal hernia    large  . Hypercholesterolemia   . Hypertension    Echocardiogram 03/2011: EF 47-42%, grade 1 diastolic dysfunction, mild MAC, mild LAE  . Iron deficiency anemia   . Morbid obesity ([place])   . S/P radiation therapy 05/16/12 - 06/29/12   Right Breast: 50 Gray/ 25 Fractions with Boost: 10 Gray/ 5 Fractions  . Shortness of breath    with exertion  . Sinus drainage   . Use of anastrozole (Arimidex) Started 10/13    Past Surgical History:  Procedure Laterality Date  . Biospy Right Breast  02/01/12   Right Breast Needle Core Biopsy: ductal Carcinoma  In situ with Papillary Features, ER/PR Positive  . BREAST EXCISIONAL BIOPSY     left 1998  . BREAST LUMPECTOMY  04/1997  . Lumpectomy Right Breast  03/16/12   Ductal carcinoma In situ: clear margins: 0/1 Node Negative  . TOTAL KNEE ARTHROPLASTY Right 05/15/2014   Procedure: RIGHT TOTAL KNEE ARTHROPLASTY;  Surgeon:  Andrea Bastos, MD;  Location: [facility];  Service: Orthopedics;  Laterality: Right;    Current Meds  Medication Sig  . cetirizine (ZYRTEC) 10 MG tablet Take 10 mg by mouth daily as needed. For allergies.  . cholecalciferol (VITAMIN D) 1000 units tablet Take 2,000 Units by mouth daily.  [name] doxazosin (CARDURA) 8 MG tablet Take 8 mg by mouth daily.  . ferrous fumarate (HEMOCYTE - 106 MG FE) 325 (106 FE) MG TABS tablet Take 1 tablet by mouth.  [name] glipiZIDE (GLUCOTROL) 10 MG tablet Take 10 mg by mouth 2 (two) times daily.  [name] losartan-hydrochlorothiazide (HYZAAR) 100-25 MG tablet Take 1 tablet by mouth daily.  . ranitidine (ZANTAC) 150 MG tablet Take 150 mg by mouth 2 (two) times daily.  . [DISCONTINUED] cloNIDine (CATAPRES) 0.1 MG tablet Take 0.1 mg by mouth 2 (two) times daily. [Patient unsure of dose or how often she takes it]-gwd  . [DISCONTINUED] hydrochlorothiazide (MICROZIDE) 12.5 MG capsule Take 12.5 mg by mouth daily.  . [DISCONTINUED] JANUMET XR 50-500 MG TB24 Take 1 tablet by mouth daily.   . [DISCONTINUED] olmesartan-hydrochlorothiazide (BENICAR HCT) 40-25 MG per tablet Take 1 tablet by mouth every morning.  . [DISCONTINUED] omeprazole (PRILOSEC) 20 MG capsule Take 20 mg by mouth daily as needed (Acid reflux). Alternates with prevacid depending upon cost For reflux    Allergies: Patient has no known allergies.  Social History   Socioeconomic History  . Marital status: Married    Spouse name: Not on file  . Number of children: 1  . Years of education: Not on file  . Highest education level: Not on file  Social Needs  . Financial resource strain: Not on file  . Food insecurity - worry: Not on file  . Food insecurity - inability: Not on file  . Transportation needs - medical: Not on file  . Transportation needs - non-medical: Not on file  Occupational History    Employer: [employer]  Tobacco Use  . Smoking status: Never Smoker  . Smokeless tobacco: Never Used    Substance and Sexual Activity  . Alcohol use: No  . Drug use: No  . Sexual activity: Not on file    Comment: menarche age 78,last menses age 1,g1,p1,first live birth age 58  Other Topics Concern  . Not on file  Social History Narrative  . Not on file    Family History  Problem Relation Age of Onset  . Heart attack Father 25  . Hypertension Mother   . Diabetes Mother   . Cancer Sister        breast  . Lymphoma Sister        Deceased at age 5    Review of Systems: A 12-system review of systems was performed and was negative except as noted in the HPI.  --------------------------------------------------------------------------------------------------  Physical Exam: BP 130/84   Pulse (!) 120   Ht 5' 2"  (1.575 m)   Wt 252 lb (114.3 kg)   SpO2 95%   BMI 46.09 kg/m   Repeat HR: 103 bpm  General:  Morbidly obese woman, seated comfortably in the exam room. HEENT: No conjunctival  pallor or scleral icterus. Moist mucous membranes. OP clear. Neck: Supple without lymphadenopathy, thyromegaly, JVD, or HJR, though evaluation is limited by body habitus. No carotid bruit. Lungs: Normal work of breathing. Clear to auscultation bilaterally without wheezes or crackles. Heart: Tachycardic but regular without murmurs or rubs. Unable to assess PMI due to body habitus.. Abd: Bowel sounds present. Soft, NT/ND. Unable to assess HSM due to body habitus. Ext: No lower extremity edema. Radial, PT, and DP pulses are 2+ bilaterally Skin: Warm and dry without rash. Neuro: CNIII-XII intact. Strength and fine-touch sensation intact in upper and lower extremities bilaterally. Psych: Normal mood and affect.  EKG:  Sinus tachycardia with right axis deviation and early R-wave transition. Question RVH.  Lab Results  Component Value Date   WBC 4.9 04/12/2017   HGB 11.4 ([name]) 04/12/2017   HCT 37.7 04/12/2017   MCV 75.9 ([name]) 04/12/2017   PLT 183 04/12/2017    Lab Results  Component Value Date    NA 143 04/12/2017   K 4.2 04/12/2017   CL 98 05/17/2014   CO2 30 (H) 04/12/2017   BUN 20.5 04/12/2017   CREATININE 1.2 (H) 04/12/2017   GLUCOSE 70 04/12/2017   ALT 25 04/12/2017    Lab Results  Component Value Date   CHOL 172 04/09/2011   HDL 51 04/09/2011   LDLCALC 106 (H) 04/09/2011   TRIG 76 04/09/2011   CHOLHDL 3.4 04/09/2011    Outside labs (09/16/17): CBC: WBC 5.7, HGB 10.9, HCT 37.4, platelet 226, MCV 73  CMP: Sodium 145, potassium 4.4, chloride 104, due to 27, BUN 19, creatinine 1.3, glucose 131, calcium 9.2, AST 12, ALT 14, alkaline phosphatase 75, total bilirubin 0.3, total protein 6.8, albumin 3.8  Lipid panel: Total cholesterol 156, triglycerides 52, HDL 50, LDL 96  Hemoglobin A1c: 7.9  TSH 2.43  --------------------------------------------------------------------------------------------------  ASSESSMENT AND PLAN: Dyspnea on exertion This has been present for the last 5-6 months. There are several possibilities for her symptoms. However, EKG demonstrating sinus tachycardia and possible RVH raise the possibility lung disease, pulmonary hypertension, and/or right heart failure. There are no risk factors for PE, and given chronic nature of the dyspnea, I do not believe that this represents an acute PE. We will proceed with transthoracic echocardiogram for further evaluation. If this is normal, we will need to consider non-invasive ischemia testing (stress test vs. CTA). If there is evidence of a structural abnormality or significant pulmonary hypertension, I would favor proceeding directly to left and right heart catheterization. Of note, CXR in 05/2017 did not show any obvious cardiopulmonary disease.  Sinus tachycardia This is likely multifactorial, including deconditioning, anxiety, and possible underlying cardiopulmonary pathology. Repeat heart rate in the office was 103 bpm; patient reports her resting heart rate at home is typically 90-100 bpm. Recent TSH was  normal. I advised Ms. Palen to minimize her caffeine intake.  Essential hypertension Blood pressure mildly elevated today (goal < 130/80). I will defer making medication changes today. However, addition of a beta blocker in the future should be considered for heart rate and blood pressure control.  Diabetes mellitus Most recent A1c was above goal. I encouraged lifestyle modifications. Further management per Dr. Lisbeth [name].  Follow-up: Return to clinic in 1 month.  Andrea Bush, MD 11/12/2017 10:17 AM

## 2017-11-12 ENCOUNTER — Encounter: Payer: Self-pay | Admitting: Internal Medicine

## 2017-11-12 DIAGNOSIS — R0609 Other forms of dyspnea: Principal | ICD-10-CM | POA: Insufficient documentation

## 2017-11-12 DIAGNOSIS — R000 Tachycardia, unspecified: Secondary | ICD-10-CM | POA: Insufficient documentation

## 2017-11-12 DIAGNOSIS — R0602 Shortness of breath: Secondary | ICD-10-CM | POA: Insufficient documentation

## 2017-11-19 ENCOUNTER — Other Ambulatory Visit (HOSPITAL_COMMUNITY): Payer: BLUE CROSS/BLUE SHIELD

## 2017-11-19 ENCOUNTER — Ambulatory Visit (HOSPITAL_COMMUNITY): Payer: BLUE CROSS/BLUE SHIELD | Attending: Cardiovascular Disease

## 2017-11-19 ENCOUNTER — Other Ambulatory Visit: Payer: Self-pay

## 2017-11-19 DIAGNOSIS — R000 Tachycardia, unspecified: Secondary | ICD-10-CM | POA: Diagnosis not present

## 2017-11-19 DIAGNOSIS — R0600 Dyspnea, unspecified: Secondary | ICD-10-CM

## 2017-11-19 DIAGNOSIS — Z853 Personal history of malignant neoplasm of breast: Secondary | ICD-10-CM | POA: Diagnosis not present

## 2017-11-19 DIAGNOSIS — I517 Cardiomegaly: Secondary | ICD-10-CM | POA: Insufficient documentation

## 2017-11-19 DIAGNOSIS — R0609 Other forms of dyspnea: Secondary | ICD-10-CM

## 2017-11-19 DIAGNOSIS — I361 Nonrheumatic tricuspid (valve) insufficiency: Secondary | ICD-10-CM | POA: Insufficient documentation

## 2017-11-19 DIAGNOSIS — E119 Type 2 diabetes mellitus without complications: Secondary | ICD-10-CM | POA: Diagnosis not present

## 2017-11-24 ENCOUNTER — Telehealth: Payer: Self-pay | Admitting: Internal Medicine

## 2017-11-24 DIAGNOSIS — R9431 Abnormal electrocardiogram [ECG] [EKG]: Secondary | ICD-10-CM

## 2017-11-24 NOTE — Telephone Encounter (Signed)
Pt is aware of echo results and MD's recommendations. An order for  Laxyscan stress test placed in Epic. Pt  is aware that scheduler will call her to scheduled test. Pt has an appointment  with Dr End on 12/20/17. Pt would like to have the stress test to scheduled prior appointment with MD. Pt prefers stress appointment on Thursday or Friday  in AM.

## 2017-11-24 NOTE — Telephone Encounter (Signed)
New Message     Patient was returning [name] call for Echo results

## 2017-11-24 NOTE — Telephone Encounter (Signed)
Left pt a message to call back. 

## 2017-11-30 ENCOUNTER — Telehealth (HOSPITAL_COMMUNITY): Payer: Self-pay | Admitting: *Deleted

## 2017-11-30 NOTE — Telephone Encounter (Signed)
Left message on voicemail per DPR in reference to upcoming appointment scheduled on 12/06/17 at 1230 with detailed instructions given per Myocardial Perfusion Study Information Sheet for the test. LM to arrive 15 minutes early, and that it is imperative to arrive on time for appointment to keep from having the test rescheduled. If you need to cancel or reschedule your appointment, please call the office within 24 hours of your appointment. Failure to do so may result in a cancellation of your appointment, and a $50 no show fee. Phone number given for call back for any questions.

## 2017-12-06 ENCOUNTER — Ambulatory Visit (HOSPITAL_COMMUNITY): Payer: BLUE CROSS/BLUE SHIELD | Attending: Cardiology

## 2017-12-06 DIAGNOSIS — E119 Type 2 diabetes mellitus without complications: Secondary | ICD-10-CM | POA: Diagnosis not present

## 2017-12-06 DIAGNOSIS — I10 Essential (primary) hypertension: Secondary | ICD-10-CM | POA: Insufficient documentation

## 2017-12-06 DIAGNOSIS — R9431 Abnormal electrocardiogram [ECG] [EKG]: Secondary | ICD-10-CM | POA: Insufficient documentation

## 2017-12-06 DIAGNOSIS — E785 Hyperlipidemia, unspecified: Secondary | ICD-10-CM | POA: Insufficient documentation

## 2017-12-06 MED ORDER — TECHNETIUM TC 99M TETROFOSMIN IV KIT
32.9000 | PACK | Freq: Once | INTRAVENOUS | Status: AC | PRN
  Administered 2017-12-06: 32.9 via INTRAVENOUS
  Filled 2017-12-06: qty 33

## 2017-12-06 MED ORDER — REGADENOSON 0.4 MG/5ML IV SOLN
0.4000 mg | Freq: Once | INTRAVENOUS | Status: AC
  Administered 2017-12-06: 0.4 mg via INTRAVENOUS

## 2017-12-07 ENCOUNTER — Ambulatory Visit (HOSPITAL_COMMUNITY): Payer: BLUE CROSS/BLUE SHIELD | Attending: Cardiovascular Disease

## 2017-12-07 LAB — MYOCARDIAL PERFUSION IMAGING
CHL CUP NUCLEAR SDS: 4
CHL CUP NUCLEAR SRS: 2
CSEPPHR: 98 {beats}/min
LV dias vol: 88 mL (ref 46–106)
LVSYSVOL: 34 mL
RATE: 0.3
Rest HR: 72 {beats}/min
SSS: 6
TID: 0.78

## 2017-12-07 MED ORDER — TECHNETIUM TC 99M TETROFOSMIN IV KIT
32.5000 | PACK | Freq: Once | INTRAVENOUS | Status: AC | PRN
  Administered 2017-12-07: 32.5 via INTRAVENOUS
  Filled 2017-12-07: qty 33

## 2017-12-10 ENCOUNTER — Encounter: Payer: Self-pay | Admitting: *Deleted

## 2017-12-10 ENCOUNTER — Telehealth: Payer: Self-pay | Admitting: Internal Medicine

## 2017-12-10 NOTE — Telephone Encounter (Signed)
New Message   Patient is calling to obtain results from her recent stress test. Please call to discuss.

## 2017-12-10 NOTE — Telephone Encounter (Signed)
Returned pt's call and read her Dr Darnelle Bos result note from [facility]. She had no additional questions and verbalized understanding.

## 2017-12-20 ENCOUNTER — Ambulatory Visit: Payer: BLUE CROSS/BLUE SHIELD | Admitting: Internal Medicine

## 2017-12-20 ENCOUNTER — Encounter: Payer: Self-pay | Admitting: Internal Medicine

## 2017-12-20 ENCOUNTER — Other Ambulatory Visit: Payer: Self-pay | Admitting: Internal Medicine

## 2017-12-20 VITALS — BP 140/90 | HR 114 | Ht 62.0 in | Wt 256.6 lb

## 2017-12-20 DIAGNOSIS — R0609 Other forms of dyspnea: Secondary | ICD-10-CM

## 2017-12-20 DIAGNOSIS — I10 Essential (primary) hypertension: Secondary | ICD-10-CM

## 2017-12-20 DIAGNOSIS — R0600 Dyspnea, unspecified: Secondary | ICD-10-CM

## 2017-12-20 DIAGNOSIS — R000 Tachycardia, unspecified: Secondary | ICD-10-CM

## 2017-12-20 MED ORDER — CARVEDILOL 3.125 MG PO TABS: 3.1250 mg | ORAL_TABLET | Freq: Two times a day (BID) | ORAL | 3 refills | Status: DC

## 2017-12-20 NOTE — Progress Notes (Signed)
Follow-up Outpatient Visit Date: 12/20/2017  Primary Care Provider: Leonides Sake, [place] [address]  Chief Complaint: follow-up shortness of breath  HPI:  [name] is a 67 y.o. year-old female with history of hypertension, hyperlipidemia, type 2 diabetes mellitus, anemia (combination of iron deficiency and thalassemia), GERD, chronic kidney disease stage, right breast cancer, who presents for follow-up of shortness of breath. I met [name] month ago, at which she reported exertional dyspnea last 5-6 months. EKG in the office that day was notable for sinus tachycardia.. We subsequently performed a transthoracic echocardiogram and pharmacologic myocardial perfusion stress test, as outlined below. Today, [name] reports that she feels about the same, still short of breath with modest activity. She notes occasional leg swelling for which she takes an as needed diuretic (? Furosemide) with prompt resolution. She denies chest pain, palpitations, lightheadedness, and orthopnea.   --------------------------------------------------------------------------------------------------  Cardiovascular History & Procedures: Cardiovascular Problems:  Dyspnea on exertion  Risk Factors:  Hypertension, hyperlipidemia, diabetes mellitus, and age greater than 27  Cath/PCI:  None  CV Surgery:  None  EP Procedures and Devices:  None  Non-Invasive Evaluation(s):  Pharmacologic myocardial perfusion stress test (12/07/17): Normal study without ischemia or scar. LVEF 62%.  TTE (11/19/17): Normal LV size with mild LVH. LVEF 55-60% with normal wall motion. Grade 1 diastolic dysfunction with elevated filling pressure. Normal RV size and function. Normal pulmonary artery pressure. Mild tricuspid regurgitation.  ETT (03/01/12): Low risk study. No EKG changes. Poor exercise tolerance.   Recent CV Pertinent Labs: Lab Results  Component Value Date   CHOL 172  04/09/2011   HDL 51 04/09/2011   LDLCALC 106 (H) 04/09/2011   TRIG 76 04/09/2011   CHOLHDL 3.4 04/09/2011   INR 1.04 05/10/2014   K 4.2 04/12/2017   BUN 20.5 04/12/2017   CREATININE 1.2 (H) 04/12/2017    Past medical and surgical history were reviewed and updated in EPIC.  Current Meds  Medication Sig  . cetirizine (ZYRTEC) 10 MG tablet Take 10 mg by mouth daily as needed. For allergies.  . cholecalciferol (VITAMIN D) 1000 units tablet Take 2,000 Units by mouth daily.  [name] doxazosin (CARDURA) 8 MG tablet Take 8 mg by mouth daily.  . ferrous fumarate (HEMOCYTE - 106 MG FE) 325 (106 FE) MG TABS tablet Take 1 tablet by mouth.  [name] glipiZIDE (GLUCOTROL) 10 MG tablet Take 10 mg by mouth 2 (two) times daily.  [name] losartan-hydrochlorothiazide (HYZAAR) 100-25 MG tablet Take 1 tablet by mouth daily.  . ranitidine (ZANTAC) 150 MG tablet Take 150 mg by mouth 2 (two) times daily.    Allergies: Patient has no known allergies.  Social History   Socioeconomic History  . Marital status: Married    Spouse name: Not on file  . Number of children: 1  . Years of education: Not on file  . Highest education level: Not on file  Occupational History    Employer: [employer]  Social Needs  . Financial resource strain: Not on file  . Food insecurity:    Worry: Not on file    Inability: Not on file  . Transportation needs:    Medical: Not on file    Non-medical: Not on file  Tobacco Use  . Smoking status: Never Smoker  . Smokeless tobacco: Never Used  Substance and Sexual Activity  . Alcohol use: No  . Drug use: No  . Sexual activity: Not on file  Comment: menarche age 35,last menses age 54,g1,p1,first live birth age 63  Lifestyle  . Physical activity:    Days per week: Not on file    Minutes per session: Not on file  . Stress: Not on file  Relationships  . Social connections:    Talks on phone: Not on file    Gets together: Not on file    Attends religious service: Not  on file    Active member of club or organization: Not on file    Attends meetings of clubs or organizations: Not on file    Relationship status: Not on file  . Intimate partner violence:    Fear of current or ex partner: Not on file    Emotionally abused: Not on file    Physically abused: Not on file    Forced sexual activity: Not on file  Other Topics Concern  . Not on file  Social History Narrative  . Not on file    Family History  Problem Relation Age of Onset  . Heart attack Father 67  . Hypertension Mother   . Diabetes Mother   . Cancer Sister        breast  . Lymphoma Sister        Deceased at age 68    Review of Systems: A 12-system review of systems was performed and was negative except as noted in the HPI.  --------------------------------------------------------------------------------------------------  Physical Exam: BP 140/90   Pulse (!) 114   Ht '5\' 2"'  (1.575 m)   Wt 256 lb 9.6 oz (116.4 kg)   SpO2 96%   BMI 46.93 kg/m   General:  Morbidly obese woman, seated comfortably in the exam room. HEENT: No conjunctival pallor or scleral icterus. Moist mucous membranes.  OP clear. Neck: Supple without lymphadenopathy, thyromegaly, JVD, or HJR. No carotid bruit. Lungs: Normal work of breathing. Clear to auscultation bilaterally without wheezes or crackles. Heart: Tachycardic but regular without murmurs or rubs. Unable to assess PMI due to body habitus. Abd: Bowel sounds present. Soft, NT/ND. Unable to assess HSM due to body habitus. Ext: No lower extremity edema. Radial, PT, and DP pulses are 2+ bilaterally. Skin: Warm and dry without rash.   Lab Results  Component Value Date   WBC 4.9 04/12/2017   HGB 11.4 (L) 04/12/2017   HCT 37.7 04/12/2017   MCV 75.9 (L) 04/12/2017   PLT 183 04/12/2017    Lab Results  Component Value Date   NA 143 04/12/2017   K 4.2 04/12/2017   CL 98 05/17/2014   CO2 30 (H) 04/12/2017   BUN 20.5 04/12/2017   CREATININE 1.2 (H)  04/12/2017   GLUCOSE 70 04/12/2017   ALT 25 04/12/2017    Lab Results  Component Value Date   CHOL 172 04/09/2011   HDL 51 04/09/2011   LDLCALC 106 (H) 04/09/2011   TRIG 76 04/09/2011   CHOLHDL 3.4 04/09/2011    --------------------------------------------------------------------------------------------------  ASSESSMENT AND PLAN: Dyspnea on exertion Likely multifactorial, including morbid obesity, deconditioning, and an element of diastolic dysfunction. We have agreed to start carvedilol 3.125 mg BID. I have encouraged [name] to exercise, watch her diet and lose weight. If her symptoms persist despite these measures, she may benefit from pulmonary evaluation.  Sinus tachycardia Noted again today. No clear cause other than deconditioning. Heart rate fell below 100 bpm by the Trai Ells of the visit today. We will start carvedilol 3.125 mg BID, as above.  Essential hypertension BP not well-controlled today. Continue  current medications and add carvedilol 3.125 mg BID. Further uptitration by Dr. Lisbeth Ply may be necessary in the future.  Morbid obesity Patient's BMI > 40. We discussed lifestyle modifications, including diet and exercise, to help lower her weight and improve her comorbidities.  Follow-up: Return to clinic in 6 months.  Nelva Bush, MD 12/20/2017 10:18 AM

## 2017-12-20 NOTE — Telephone Encounter (Signed)
Pt's medication was sent to pt's pharmacy as requested. Confirmation received.  °

## 2017-12-20 NOTE — Patient Instructions (Signed)
Medication Instructions:  START Carvedilol 3.125 mg twice per day by mouth   -- If you need a refill on your cardiac medications before your next appointment, please call your pharmacy. --  Labwork: None ordered  Testing/Procedures: None ordered  Follow-Up: Your physician wants you to follow-up in: 6 months with Dr. Saunders Revel.    You will receive a reminder letter in the mail two months in advance. If you don't receive a letter, please call our office to schedule the follow-up appointment.  Thank you for choosing [facility]!!    Any Other Special Instructions Will Be Listed Below (If Applicable).

## 2017-12-20 NOTE — Telephone Encounter (Signed)
°*  STAT* If patient is at the pharmacy, call can be transferred to refill team.   1. Which medications need to be refilled? (please list name of each medication and dose if known)  Carvedilol 3.125 mg  2. Which pharmacy/location (including street and city if local pharmacy) is medication to be sent to? [facility] [place], [place], [place] [address]   3. Do they need a 30 day or 90 day supply? [place]

## 2018-02-03 ENCOUNTER — Other Ambulatory Visit: Payer: Self-pay | Admitting: Family Medicine

## 2018-02-03 DIAGNOSIS — Z1231 Encounter for screening mammogram for malignant neoplasm of breast: Secondary | ICD-10-CM

## 2018-02-22 ENCOUNTER — Ambulatory Visit
Admission: RE | Admit: 2018-02-22 | Discharge: 2018-02-22 | Disposition: A | Discharge status: Home / Self Care | Payer: BLUE CROSS/BLUE SHIELD | Source: Ambulatory Visit | Attending: Family Medicine | Admitting: Family Medicine

## 2018-02-22 DIAGNOSIS — Z1231 Encounter for screening mammogram for malignant neoplasm of breast: Secondary | ICD-10-CM | POA: Diagnosis not present

## 2018-06-16 ENCOUNTER — Encounter: Payer: Self-pay | Admitting: Pulmonary Disease

## 2018-06-16 ENCOUNTER — Ambulatory Visit (INDEPENDENT_AMBULATORY_CARE_PROVIDER_SITE_OTHER)
Admission: RE | Admit: 2018-06-16 | Discharge: 2018-06-16 | Disposition: A | Discharge status: Home / Self Care | Payer: BLUE CROSS/BLUE SHIELD | Source: Ambulatory Visit | Attending: Pulmonary Disease | Admitting: Pulmonary Disease

## 2018-06-16 ENCOUNTER — Ambulatory Visit: Payer: BLUE CROSS/BLUE SHIELD | Admitting: Pulmonary Disease

## 2018-06-16 VITALS — BP 158/100 | HR 71 | Ht 61.5 in | Wt 257.0 lb

## 2018-06-16 DIAGNOSIS — J9811 Atelectasis: Secondary | ICD-10-CM | POA: Diagnosis not present

## 2018-06-16 DIAGNOSIS — J301 Allergic rhinitis due to pollen: Secondary | ICD-10-CM | POA: Diagnosis not present

## 2018-06-16 DIAGNOSIS — R0609 Other forms of dyspnea: Secondary | ICD-10-CM | POA: Diagnosis not present

## 2018-06-16 DIAGNOSIS — J45909 Unspecified asthma, uncomplicated: Secondary | ICD-10-CM

## 2018-06-16 DIAGNOSIS — R0600 Dyspnea, unspecified: Secondary | ICD-10-CM

## 2018-06-16 LAB — NITRIC OXIDE: Nitric Oxide: 73

## 2018-06-16 MED ORDER — MOMETASONE FUROATE 220 MCG/INH IN AEPB: 1.0000 | INHALATION_SPRAY | Freq: Two times a day (BID) | RESPIRATORY_TRACT | 0 refills | Status: DC

## 2018-06-16 NOTE — Progress Notes (Signed)
Synopsis: Referred in September 2019 for dyspnea, wheezing.  She has a history of allergies and breast cancer status post lumpectomy and radiation therapy to the right breast in 2013.  She is a lifelong non-smoker.  Subjective:   PATIENT ID: [name] GENDER: female DOB: 11-12-1950, MRN: [id number]   HPI  Chief Complaint  Patient presents with  . 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  She says that she had a normal childhood without respiratory illnesses and she never smoked cigarettes.  She moved to [facility] in the 1960s when she went to [place].  She has been living here ever since.  She wonders if she started having allergies after she moved to [place].  Specifically she says that she has a lot of sinus congestion and runny nose but she never has any sneezing.  She has described 1 year of shortness of breath.  She was evaluated by cardiology earlier in the year where she had an echocardiogram and a stress test which were reportedly normal.  She says that the dyspnea is bad enough to where she has to stop when walking on level ground after about 150 or 200 feet.  She has difficulty carrying groceries and climbing a flight of stairs is also very difficult as well.  She has noticed wheezing in the last several weeks.  She says that one day she was sitting at work and suddenly she started wheezing.  She was seen by her primary care physician who prescribed Flonase and albuterol.  She says that both of these has helped but she still has the symptom.    Past Medical History:  Diagnosis Date  . Abnormal electrocardiogram (ECG) (EKG)   . Arthritis     . Blood transfusion 03/14/12  . Bradycardia   . Breast cancer ([place]) 03/16/12   right lumpectomy=high grade ductal ca in situ,ER/PR=positive  . Cancer ([place])    right breast ductal carcinoma in situ  . Chronic kidney disease    stage III   . Diabetes mellitus    type 2  . Dyspnea   . Edema   . GERD (gastroesophageal reflux disease)   . Hiatal hernia    large  . Hypercholesterolemia   . Hypertension    Echocardiogram 03/2011: EF 11-91%, grade 1 diastolic dysfunction, mild MAC, mild LAE  . Iron deficiency anemia   . Morbid obesity ([place])   . S/P radiation therapy 05/16/12 - 06/29/12   Right Breast: 50 Gray/ 25 Fractions with Boost: 10 Gray/ 5 Fractions  . Shortness of breath    with exertion  . Sinus drainage   . Use of anastrozole (Arimidex) Started 10/13     Family History  Problem Relation Age of Onset  . Heart attack Father 18  . Hypertension Mother   . Diabetes Mother   . Cancer Sister        breast  . Lymphoma Sister        Deceased at age 48     Social History   Socioeconomic History  . Marital status: Married    Spouse name: Not on file  . Number  of children: 1  . Years of education: Not on file  . Highest education level: Not on file  Occupational History    Employer: [employer]  Social Needs  . Financial resource strain: Not on file  . Food insecurity:    Worry: Not on file    Inability: Not on file  . Transportation needs:    Medical: Not on file    Non-medical: Not on file  Tobacco Use  . Smoking status: Never Smoker  . Smokeless tobacco: Never Used  Substance and Sexual Activity  . Alcohol use: No  . Drug use: No  . Sexual activity: Not on file    Comment: menarche age 89,last menses age 16,g1,p1,first live birth age 43  Lifestyle  . Physical activity:    Days per week: Not on file    Minutes per session: Not on file  . Stress: Not on file  Relationships  . Social connections:    Talks on phone: Not on file    Gets  together: Not on file    Attends religious service: Not on file    Active member of club or organization: Not on file    Attends meetings of clubs or organizations: Not on file    Relationship status: Not on file  . Intimate partner violence:    Fear of current or ex partner: Not on file    Emotionally abused: Not on file    Physically abused: Not on file    Forced sexual activity: Not on file  Other Topics Concern  . Not on file  Social History Narrative  . Not on file     No Known Allergies   Outpatient Medications Prior to Visit  Medication Sig Dispense Refill  . carvedilol (COREG) 3.125 MG tablet Take 1 tablet (3.125 mg total) by mouth 2 (two) times daily. 180 tablet 3  . cetirizine (ZYRTEC) 10 MG tablet Take 10 mg by mouth daily as needed. For allergies.    . cholecalciferol (VITAMIN D) 1000 units tablet Take 2,000 Units by mouth daily.    [name] doxazosin (CARDURA) 8 MG tablet Take 8 mg by mouth daily.    [name] glipiZIDE (GLUCOTROL) 10 MG tablet Take 10 mg by mouth 2 (two) times daily.    [name] losartan-hydrochlorothiazide (HYZAAR) 100-25 MG tablet Take 1 tablet by mouth daily.    . ranitidine (ZANTAC) 150 MG tablet Take 150 mg by mouth 2 (two) times daily.    . ferrous fumarate (HEMOCYTE - 106 MG FE) 325 (106 FE) MG TABS tablet Take 1 tablet by mouth.     No facility-administered medications prior to visit.     Review of Systems  Constitutional: Negative for chills, fever, malaise/fatigue and weight loss.  HENT: Positive for congestion. Negative for nosebleeds, sinus pain and sore throat.   Eyes: Negative for photophobia, pain and discharge.  Respiratory: Positive for cough and shortness of breath. Negative for hemoptysis, sputum production and wheezing.   Cardiovascular: Negative for chest pain, palpitations, orthopnea and leg swelling.  Gastrointestinal: Negative for abdominal pain, constipation, diarrhea, nausea and vomiting.  Genitourinary: Negative for dysuria, frequency,  hematuria and urgency.  Musculoskeletal: Negative for back pain, joint pain, myalgias and neck pain.  Skin: Negative for itching and rash.  Neurological: Negative for tingling, tremors, sensory change, speech change, focal weakness, seizures, weakness and headaches.  Psychiatric/Behavioral: Negative for memory loss, substance abuse and suicidal ideas. The patient is not nervous/anxious.       Objective:  Physical Exam   Vitals:   06/16/18 1157  BP: (!) 158/100  Pulse: 71  SpO2: (!) 71%  Weight: 257 lb (116.6 kg)  Height: 5' 1.5" (1.562 m)    RA  Gen: obese but well appearing, no acute distress HENT: NCAT, OP clear, neck supple without masses Eyes: PERRL, EOMi Lymph: no cervical lymphadenopathy PULM: CTA B CV: RRR, no mgr, no JVD GI: BS+, soft, nontender, no hsm Derm: no rash or skin breakdown MSK: normal bulk and tone Neuro: A&Ox4, CN II-XII intact, strength 5/5 in all 4 extremities Psyche: normal mood and affect   CBC    Component Value Date/Time   WBC 4.9 04/12/2017 0939   WBC 9.2 05/17/2014 0425   RBC 4.97 04/12/2017 0939   RBC 4.69 05/17/2014 0425   HGB 11.4 (L) 04/12/2017 0939   HCT 37.7 04/12/2017 0939   PLT 183 04/12/2017 0939   MCV 75.9 (L) 04/12/2017 0939   MCH 22.9 (L) 04/12/2017 0939   MCH 21.5 (L) 05/17/2014 0425   MCHC 30.2 (L) 04/12/2017 0939   MCHC 30.1 05/17/2014 0425   RDW 17.4 (H) 04/12/2017 0939   LYMPHSABS 1.1 04/12/2017 0939   MONOABS 0.4 04/12/2017 0939   EOSABS 0.1 04/12/2017 0939   BASOSABS 0.0 04/12/2017 0939     Chest imaging: September 2018 chest x-ray images independently reviewed showing an elevated right hemidiaphragm normal pulmonary parenchyma  PFT:  Exhaled NO: 05/2018 73 ppm   Labs:  Path:  Echo: February 2019 TTE: LVEF 55 to 60%, mild concentric LVH, grade 1 diastolic dysfunction, PA pressure estimate 29 mmHg, RV normal size and function  Other cardiac: March 2019 perfusion study/stress test: LVEF 62%, low  risk study  Heart Catheterization:  Records from her primary care physician reviewed.  She has type 2 diabetes, gastroesophageal reflux disease, stage III CKD and obstructive sleep apnea and is intolerant of CPAP.  She was referred to me for dyspnea.  Albuterol was prescribed and her last visit 7 days ago per      Assessment & Plan:   Asthma, unspecified asthma severity, unspecified whether complicated, unspecified whether persistent - Plan: Nitric oxide, DG Chest 2 View, Pulmonary function test  Dyspnea on exertion  Allergic rhinitis due to pollen, unspecified seasonality  Discussion: This is a pleasant 67 year old female who comes to my clinic today for evaluation of shortness of breath and wheezing.  At best I am hopeful that this is just allergic rhinitis causing upper airway wheezing and the shortness of breath is related to obesity and deconditioning.  However, considering her allergy history I do suspect asthma so I think its best to go ahead and test her for that today.  We will also get a chest x-ray and obtain full pulmonary function testing.  If lung function testing and the chest x-ray is suggestive of an alternative diagnosis then we can consider a further work-up with something like a CT scan.  In the interim we will treat her with an inhaled corticosteroid to see if this helps.  Plan: Shortness of breath: Given your history of allergies I am suspicious of asthma We will check a test today called and exhaled nitric oxide test We will check a chest x-ray We will check a full pulmonary function test Continue using albuterol as needed for chest tightness wheezing or shortness of breath Take the sample of Asmanex 1 puff twice a day  When you follow-up in 2 weeks we will go over the results of these tests  and try to sort out if we need to order any other tests.     Current Outpatient Medications:  .  carvedilol (COREG) 3.125 MG tablet, Take 1 tablet (3.125 mg total) by  mouth 2 (two) times daily., Disp: 180 tablet, Rfl: 3 .  cetirizine (ZYRTEC) 10 MG tablet, Take 10 mg by mouth daily as needed. For allergies., Disp: , Rfl:  .  cholecalciferol (VITAMIN D) 1000 units tablet, Take 2,000 Units by mouth daily., Disp: , Rfl:  .  doxazosin (CARDURA) 8 MG tablet, Take 8 mg by mouth daily., Disp: , Rfl:  .  glipiZIDE (GLUCOTROL) 10 MG tablet, Take 10 mg by mouth 2 (two) times daily., Disp: , Rfl:  .  losartan-hydrochlorothiazide (HYZAAR) 100-25 MG tablet, Take 1 tablet by mouth daily., Disp: , Rfl:  .  ranitidine (ZANTAC) 150 MG tablet, Take 150 mg by mouth 2 (two) times daily., Disp: , Rfl:  .  ferrous fumarate (HEMOCYTE - 106 MG FE) 325 (106 FE) MG TABS tablet, Take 1 tablet by mouth., Disp: , Rfl:

## 2018-06-16 NOTE — Patient Instructions (Signed)
Shortness of breath: Given your history of allergies I am suspicious of asthma We will check a test today called and exhaled nitric oxide test We will check a chest x-ray We will check a full pulmonary function test Continue using albuterol as needed for chest tightness wheezing or shortness of breath Take the sample of Asmanex 1 puff twice a day  When you follow-up in 2 weeks we will go over the results of these tests and try to sort out if we need to order any other tests.

## 2018-06-16 NOTE — Progress Notes (Deleted)
   Subjective:    Patient ID: [name], female    DOB: 04/24/1951, 67 y.o.   MRN: [id number]  HPI    Review of Systems  Constitutional: Negative for fever and unexpected weight change.  HENT: Positive for congestion. Negative for dental problem, ear pain, nosebleeds, postnasal drip, rhinorrhea, sinus pressure, sneezing, sore throat and trouble swallowing.   Eyes: Negative for redness and itching.  Respiratory: Positive for cough and shortness of breath. Negative for chest tightness and wheezing.   Cardiovascular: Negative for palpitations and leg swelling.  Gastrointestinal: Negative for nausea and vomiting.  Genitourinary: Negative for dysuria.  Musculoskeletal: Negative for joint swelling.  Skin: Negative for rash.  Neurological: Negative for headaches.  Hematological: Does not bruise/bleed easily.  Psychiatric/Behavioral: Negative for dysphoric mood. The patient is not nervous/anxious.        Objective:   Physical Exam        Assessment & Plan:

## 2018-06-17 ENCOUNTER — Telehealth: Payer: Self-pay | Admitting: Pulmonary Disease

## 2018-06-17 NOTE — Telephone Encounter (Signed)
Notes recorded by Valerie Salts, CMA on 06/17/2018 at 10:03 AM EDT Left message for patient to call back for results. ------  Notes recorded by Juanito Doom, MD on 06/17/2018 at 9:33 AM EDT BJ, Please let the patient know the radiology questioned some scarring in her lungs. If her PFT is abnormal then we will need to get a CT chest. Nothing further needed right now though. Thanks, B  Pt aware of results. Nothing more needed at this time.

## 2018-07-05 ENCOUNTER — Encounter: Payer: Self-pay | Admitting: Primary Care

## 2018-07-05 ENCOUNTER — Ambulatory Visit (INDEPENDENT_AMBULATORY_CARE_PROVIDER_SITE_OTHER): Payer: BLUE CROSS/BLUE SHIELD | Admitting: Pulmonary Disease

## 2018-07-05 ENCOUNTER — Ambulatory Visit (INDEPENDENT_AMBULATORY_CARE_PROVIDER_SITE_OTHER): Payer: BLUE CROSS/BLUE SHIELD | Admitting: Primary Care

## 2018-07-05 VITALS — BP 130/94 | HR 95 | Temp 97.8°F | Ht 61.75 in | Wt 255.0 lb

## 2018-07-05 DIAGNOSIS — R062 Wheezing: Secondary | ICD-10-CM | POA: Diagnosis not present

## 2018-07-05 DIAGNOSIS — J4520 Mild intermittent asthma, uncomplicated: Secondary | ICD-10-CM

## 2018-07-05 DIAGNOSIS — J45909 Unspecified asthma, uncomplicated: Secondary | ICD-10-CM | POA: Diagnosis not present

## 2018-07-05 LAB — PULMONARY FUNCTION TEST
DL/VA % PRED: 121 %
DL/VA: 5.49 ml/min/mmHg/L
DLCO unc % pred: 77 %
DLCO unc: 16.35 ml/min/mmHg
FEF 25-75 POST: 1.81 L/s
FEF 25-75 Pre: 1.64 L/sec
FEF2575-%Change-Post: 10 %
FEF2575-%Pred-Post: 116 %
FEF2575-%Pred-Pre: 105 %
FEV1-%Change-Post: 5 %
FEV1-%Pred-Post: 96 %
FEV1-%Pred-Pre: 91 %
FEV1-PRE: 1.52 L
FEV1-Post: 1.6 L
FEV1FVC-%Change-Post: 5 %
FEV1FVC-%PRED-PRE: 103 %
FEV6-%Change-Post: 2 %
FEV6-%PRED-PRE: 90 %
FEV6-%Pred-Post: 93 %
FEV6-POST: 1.9 L
FEV6-PRE: 1.85 L
FEV6FVC-%Change-Post: 0 %
FEV6FVC-%PRED-POST: 104 %
FEV6FVC-%Pred-Pre: 104 %
FVC-%Change-Post: 0 %
FVC-%PRED-POST: 89 %
FVC-%Pred-Pre: 89 %
FVC-PRE: 1.9 L
FVC-Post: 1.9 L
PRE FEV6/FVC RATIO: 100 %
Post FEV1/FVC ratio: 84 %
Post FEV6/FVC ratio: 100 %
Pre FEV1/FVC ratio: 80 %
RV % pred: 89 %
RV: 1.85 L
TLC % PRED: 76 %
TLC: 3.62 L

## 2018-07-05 LAB — NITRIC OXIDE: FENO LEVEL (PPB): 65

## 2018-07-05 MED ORDER — MOMETASONE FUROATE 220 MCG/INH IN AEPB: 1.0000 | INHALATION_SPRAY | Freq: Two times a day (BID) | RESPIRATORY_TRACT | 0 refills | Status: DC

## 2018-07-05 MED ORDER — MOMETASONE FUROATE 220 MCG/INH IN AEPB: 1.0000 | INHALATION_SPRAY | Freq: Two times a day (BID) | RESPIRATORY_TRACT | 11 refills | Status: DC

## 2018-07-05 MED ORDER — MOMETASONE FUROATE 200 MCG/ACT IN AERO: 2.0000 | INHALATION_SPRAY | Freq: Two times a day (BID) | RESPIRATORY_TRACT | 11 refills | Status: DC

## 2018-07-05 MED ORDER — PREDNISONE 10 MG PO TABS: ORAL_TABLET | ORAL | 0 refills | Status: DC

## 2018-07-05 NOTE — Assessment & Plan Note (Signed)
-   Continue Zyrtec - May need Singulair in the future - Consider further lab testing; resp allergy panel/ IgE

## 2018-07-05 NOTE — Patient Instructions (Signed)
Testing: Pulmonary function test was normal today   RX: Prednisone 20mg  x 5 days  Asmanex (sample and prescription sent to pharmacy)  Plan A= Automatic Continue Asmanex 1 puff twice daily (everyday) Plan B= Backup Albuterol rescue inhaler as needed every 6 hours for wheezing/shortness of breath    Recommendations: Work on weight loss, try following GERD diet and start exercise program   Follow-up: FU in 4-8 weeks with Dr. Lake Bells        Food Choices for Gastroesophageal Reflux Disease, Adult When you have gastroesophageal reflux disease (GERD), the foods you eat and your eating habits are very important. Choosing the right foods can help ease your discomfort. What guidelines do I need to follow?  Choose fruits, vegetables, whole grains, and low-fat dairy products.  Choose low-fat meat, fish, and poultry.  Limit fats such as oils, salad dressings, butter, nuts, and avocado.  Keep a food diary. This helps you identify foods that cause symptoms.  Avoid foods that cause symptoms. These may be different for everyone.  Eat small meals often instead of 3 large meals a day.  Eat your meals slowly, in a place where you are relaxed.  Limit fried foods.  Cook foods using methods other than frying.  Avoid drinking alcohol.  Avoid drinking large amounts of liquids with your meals.  Avoid bending over or lying down until 2-3 hours after eating. What foods are not recommended? These are some foods and drinks that may make your symptoms worse: Vegetables Tomatoes. Tomato juice. Tomato and spaghetti sauce. Chili peppers. Onion and garlic. Horseradish. Fruits Oranges, grapefruit, and lemon (fruit and juice). Meats High-fat meats, fish, and poultry. This includes hot dogs, ribs, ham, sausage, salami, and bacon. Dairy Whole milk and chocolate milk. Sour cream. Cream. Butter. Ice cream. Cream cheese. Drinks Coffee and tea. Bubbly (carbonated) drinks or energy  drinks. Condiments Hot sauce. Barbecue sauce. Sweets/Desserts Chocolate and cocoa. Donuts. Peppermint and spearmint. Fats and Oils High-fat foods. This includes [place] fries and potato chips. Other Vinegar. Strong spices. This includes black pepper, white pepper, red pepper, cayenne, curry powder, cloves, ginger, and chili powder. The items listed above may not be a complete list of foods and drinks to avoid. Contact your dietitian for more information. This information is not intended to replace advice given to you by your health care provider. Make sure you discuss any questions you have with your health care provider. Document Released: 03/15/2012 Document Revised: 02/20/2016 Document Reviewed: 07/19/2013 Elsevier Interactive Patient Education  2017 [employer].

## 2018-07-05 NOTE — Progress Notes (Signed)
PFT done today. 

## 2018-07-05 NOTE — Progress Notes (Signed)
_0  ID: [name], female    DOB: August 28, 1951, 67 y.o.   MRN: [id number]  Chief Complaint  Patient presents with  . Follow-up    SOB, wheezing, cough with yellowish tinge mucus    Referring provider: Hamrick, Lorin Mercy, MD  HPI: 67 year old female, never smoked. 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 PMH hypertension, allergic rhinitis, DM type II, CKD stage 3, breast cancer. Patient of Dr. Lake Bells, seen for initial consult on 05/1918 for dyspnea and wheezing. Normal cardiology work up. Suspicious for asthma. FENO 73. Continues albuterol as needed and started on trial Asmanex 1 puff twice daily.   CXR 06/17/18 - showed low lung volumes with mild bibasilar atelectasis/infiltrates. Needs PFTs, if abnormal will need to get CT chest  07/05/2018 Patient presents today for 1 month follow-up with full PFTS. Complains of occasional sob, wheezing and cough. States that some days are better than others. Used Asmanex for 2 weeks, she did notice some improvement with trial therapy.  Her albuterol rescue inhaler does help as well.    Testing: PFTs 07/05/2018- FVC 1.90 (89%), FEV1 1.60 (96%), ratio 80, DLCOunc 16.35 (77), TLC 3.62 (76)  CXR 06/17/18 - showed low lung volumes with mild bibasilar atelectasis/infiltrates  Repeat FENO 07/05/2018 - 65 (73)    No Known Allergies  Immunization History  Administered Date(s) Administered  . Influenza, Quadrivalent, Recombinant, Inj, Pf 06/14/2018    Past Medical History:  Diagnosis Date  . Abnormal electrocardiogram (ECG) (EKG)   . Arthritis   . Blood transfusion 03/14/12  . Bradycardia   . Breast cancer ([place]) 03/16/12   right lumpectomy=high grade ductal ca in situ,ER/PR=positive  . Cancer ([place])    right breast ductal carcinoma in situ  . Chronic kidney disease    stage III   . Diabetes mellitus    type 2  . Dyspnea   . Edema   . GERD (gastroesophageal reflux disease)   . Hiatal hernia    large  .  Hypercholesterolemia   . Hypertension    Echocardiogram 03/2011: EF 86-76%, grade 1 diastolic dysfunction, mild MAC, mild LAE  . Iron deficiency anemia   . Morbid obesity ([place])   . S/P radiation therapy 05/16/12 - 06/29/12   Right Breast: 50 Gray/ 25 Fractions with Boost: 10 Gray/ 5 Fractions  . Shortness of breath    with exertion  . Sinus drainage   . Use of anastrozole (Arimidex) Started 10/13    Tobacco History: Social History   Tobacco Use  Smoking Status Never Smoker  Smokeless Tobacco Never Used   Counseling given: Not Answered   Outpatient Medications Prior to Visit  Medication Sig Dispense Refill  . carvedilol (COREG) 3.125 MG tablet Take 1 tablet (3.125 mg total) by mouth 2 (two) times daily. 180 tablet 3  . cetirizine (ZYRTEC) 10 MG tablet Take 10 mg by mouth daily as needed. For allergies.    . cholecalciferol (VITAMIN D) 1000 units tablet Take 2,000 Units by mouth daily.    [name] doxazosin (CARDURA) 8 MG tablet Take 8 mg by mouth daily.    . ferrous fumarate (HEMOCYTE - 106 MG FE) 325 (106 FE) MG TABS tablet Take 1 tablet by mouth.    [name] glipiZIDE (GLUCOTROL) 10 MG tablet Take 10 mg by mouth 2 (two) times daily.    [name] losartan-hydrochlorothiazide (HYZAAR) 100-25 MG tablet Take 1 tablet by mouth daily.    . ranitidine (ZANTAC) 150 MG tablet Take 150  mg by mouth 2 (two) times daily.    . mometasone (ASMANEX, 60 METERED DOSES,) 220 MCG/INH inhaler Inhale 1 puff into the lungs 2 (two) times daily. 1 Inhaler 0   No facility-administered medications prior to visit.    Review of Systems  Review of Systems  Constitutional: Negative.   Respiratory: Positive for cough, shortness of breath, wheezing and stridor. Negative for apnea and chest tightness.   Cardiovascular: Negative.   Gastrointestinal:       GERD symptoms    Physical Exam  BP (!) 130/94 (BP Location: Left Arm, Cuff Size: Normal)   Pulse 95   Temp 97.8 F (36.6 C)   Ht 5' 1.75" (1.568 m)   Wt 255 lb (115.7  kg)   SpO2 94%   BMI 47.02 kg/m  Physical Exam  Constitutional: She is oriented to person, place, and time. She appears well-developed and well-nourished. No distress.  Overweight  HENT:  Head: Normocephalic and atraumatic.  Eyes: Pupils are equal, round, and reactive to light. EOM are normal.  Neck: Normal range of motion.  Cardiovascular: Normal rate and regular rhythm.  Pulmonary/Chest: Effort normal. No stridor. No respiratory distress. She has no wheezes.  CTA  Musculoskeletal: Normal range of motion.  Neurological: She is alert and oriented to person, place, and time.  Skin: Skin is warm and dry.  Psychiatric: She has a normal mood and affect. Her behavior is normal. Judgment and thought content normal.     Lab Results:  CBC    Component Value Date/Time   WBC 4.9 04/12/2017 0939   WBC 9.2 05/17/2014 0425   RBC 4.97 04/12/2017 0939   RBC 4.69 05/17/2014 0425   HGB 11.4 (L) 04/12/2017 0939   HCT 37.7 04/12/2017 0939   PLT 183 04/12/2017 0939   MCV 75.9 (L) 04/12/2017 0939   MCH 22.9 (L) 04/12/2017 0939   MCH 21.5 (L) 05/17/2014 0425   MCHC 30.2 (L) 04/12/2017 0939   MCHC 30.1 05/17/2014 0425   RDW 17.4 (H) 04/12/2017 0939   LYMPHSABS 1.1 04/12/2017 0939   MONOABS 0.4 04/12/2017 0939   EOSABS 0.1 04/12/2017 0939   BASOSABS 0.0 04/12/2017 0939    BMET    Component Value Date/Time   NA 143 04/12/2017 0939   K 4.2 04/12/2017 0939   CL 98 05/17/2014 0425   CL 104 02/02/2013 1127   CO2 30 (H) 04/12/2017 0939   GLUCOSE 70 04/12/2017 0939   GLUCOSE 95 02/02/2013 1127   BUN 20.5 04/12/2017 0939   CREATININE 1.2 (H) 04/12/2017 0939   CALCIUM 9.8 04/12/2017 0939   GFRNONAA 31 (L) 05/17/2014 0425   GFRAA 36 (L) 05/17/2014 0425    BNP No results found for: BNP  ProBNP No results found for: PROBNP  Imaging: Dg Chest 2 View  Result Date: 06/17/2018 CLINICAL DATA:  Dyspnea. EXAM: CHEST - 2 VIEW COMPARISON:  06/04/2017. FINDINGS: Mediastinum and hilar  structures normal. Heart size normal. Low lung volumes with bibasilar atelectasis/infiltrates. Probable hiatal hernia. Stable elevation right hemidiaphragm. No acute bony abnormality. IMPRESSION: Low lung volumes with mild bibasilar atelectasis/infiltrates. Electronically Signed   By: Marcello Moores  Register   On: 06/17/2018 06:24     Assessment & Plan:  Pleasant 67 year old female, never smoked. Seen for initial consult with Dr. Lake Bells in september for dyspnea and wheezing. FENO was sig elevated, started on Asmanex. States she noticed some improvement with trial ICS. CXR showed low lung vol with mild bibasilar atelectasis/infiltrates. Patient states she has  long standing history of recurrent bronchitis which could explain scarring seen in CXR. PFTs were normal today. Repeat FENO remains elevated, symptoms consistent with mild allergic asthma. Will treat with short course of prednisone with instructions to monitor blood glucose d/t hx diabetes. Restart Asmanex and follow-up in 4-8 weeks.   Allergic asthma, mild intermittent, uncomplicated - Normal PFTs today  - Elevated FENO 65  - Plan continue Asmanex 2 puffs BID (change to hfa for patient preference) - Needs short course of oral prednisone 39m x5 days (instructed to monitor blood sugars) - Albuterol rescue inhaler as needed  - FU in 4-8 weeks with Dr. MLake Bellsor NP   Allergic rhinitis - Continue Zyrtec - May need Singulair in the future - Consider further lab testing; resp allergy panel/ IgE   Morbid obesity - Encourage weight loss and regular exercise    EMartyn Ehrich NP 07/05/2018

## 2018-07-05 NOTE — Assessment & Plan Note (Signed)
-   Encourage weight loss and regular exercise

## 2018-07-05 NOTE — Assessment & Plan Note (Addendum)
-   Normal PFTs today  - Elevated FENO 65  - Plan continue Asmanex 2 puffs BID (change to hfa for patient preference) - Needs short course of oral prednisone 20mg  x5 days (instructed to monitor blood sugars) - Albuterol rescue inhaler as needed  - FU in 4-8 weeks with Dr. Lake Bells or NP

## 2018-07-08 NOTE — Progress Notes (Signed)
Reviewed, agree 

## 2018-07-15 ENCOUNTER — Encounter: Payer: Self-pay | Admitting: Family Medicine

## 2018-07-15 DIAGNOSIS — E119 Type 2 diabetes mellitus without complications: Secondary | ICD-10-CM | POA: Diagnosis not present

## 2018-07-15 LAB — HM DIABETES EYE EXAM

## 2018-08-18 ENCOUNTER — Telehealth: Payer: Self-pay | Admitting: Primary Care

## 2018-08-18 NOTE — Telephone Encounter (Signed)
Last time Rx prednisone was sent to pt's pharmacy was at pt's OV 07/05/18.  Called pt's pharmacy and spoke with Jenell Milliner letting her know that the only time we had sent the prednisone Rx was on 07/05/18. Jenell Milliner confirmed that that date for the Rx is what she was talking about.  I gave the verbal for the prednisone Rx that was sent in. Nothing further needed.

## 2018-09-01 ENCOUNTER — Ambulatory Visit (INDEPENDENT_AMBULATORY_CARE_PROVIDER_SITE_OTHER): Payer: BLUE CROSS/BLUE SHIELD | Admitting: Pulmonary Disease

## 2018-09-01 ENCOUNTER — Encounter: Payer: Self-pay | Admitting: Pulmonary Disease

## 2018-09-01 VITALS — BP 152/90 | HR 90 | Ht 61.42 in | Wt 257.6 lb

## 2018-09-01 DIAGNOSIS — R0600 Dyspnea, unspecified: Secondary | ICD-10-CM | POA: Diagnosis not present

## 2018-09-01 DIAGNOSIS — J301 Allergic rhinitis due to pollen: Secondary | ICD-10-CM

## 2018-09-01 DIAGNOSIS — J4530 Mild persistent asthma, uncomplicated: Secondary | ICD-10-CM | POA: Diagnosis not present

## 2018-09-01 DIAGNOSIS — R062 Wheezing: Secondary | ICD-10-CM

## 2018-09-01 MED ORDER — FLUTICASONE PROPIONATE 50 MCG/ACT NA SUSP: 2.0000 | Freq: Every day | NASAL | 2 refills | Status: DC

## 2018-09-01 MED ORDER — FLUTICASONE FUROATE-VILANTEROL 200-25 MCG/INH IN AEPB: 1.0000 | INHALATION_SPRAY | Freq: Every day | RESPIRATORY_TRACT | 0 refills | Status: DC

## 2018-09-01 NOTE — Progress Notes (Signed)
Synopsis: Referred in September 2019 for dyspnea, wheezing.  She has a history of allergies and breast cancer status post lumpectomy and radiation therapy to the right breast in 2013.  She is a lifelong non-smoker.  Subjective:   PATIENT ID: [name] GENDER: female DOB: 1951/04/07, MRN: [id number]   HPI  Chief Complaint  Patient presents with  . Follow-up    shortness of breath with exertion, not wheezing     She is still dyspneic.  She says the ASmanex helps a little, but she still feels shortness of breath with minimal activity.  She is not exercising.  She has gained over 20 pounds in the last year.  She has not been sick with bronchitis or pneumonia since the last visit.  She does not wheeze she just feels predominantly short of breath.  She says that she does have significant allergic rhinitis symptoms.  She still has a lot of congestion and postnasal drip which makes her short of breath.  She uses a nasal steroid sparingly.  She does take Zyrtec on a day-to-day basis.  She is compliant with her gastroesophageal reflux medicine which has been helpful.   Past Medical History:  Diagnosis Date  . Abnormal electrocardiogram (ECG) (EKG)   . Arthritis   . Blood transfusion 03/14/12  . Bradycardia   . Breast cancer ([place]) 03/16/12   right lumpectomy=high grade ductal ca in situ,ER/PR=positive  . Cancer ([place])    right breast ductal carcinoma in situ  . Chronic kidney disease    stage III   . Diabetes mellitus    type 2  . Dyspnea   . Edema   . GERD (gastroesophageal reflux disease)   . Hiatal hernia    large  . Hypercholesterolemia   . Hypertension    Echocardiogram 03/2011: EF 76-19%, grade 1 diastolic dysfunction, mild MAC, mild LAE  . Iron deficiency anemia   . Morbid obesity ([place])   . S/P radiation therapy 05/16/12 - 06/29/12   Right Breast: 50 Gray/ 25 Fractions with Boost: 10 Gray/ 5 Fractions  . Shortness of breath    with exertion  . Sinus drainage   . Use of  anastrozole (Arimidex) Started 10/13     Review of Systems  Constitutional: Negative for chills, fever, malaise/fatigue and weight loss.  HENT: Positive for congestion. Negative for nosebleeds, sinus pain and sore throat.   Eyes: Negative for photophobia, pain and discharge.  Respiratory: Positive for cough and shortness of breath. Negative for hemoptysis, sputum production and wheezing.   Cardiovascular: Negative for chest pain, palpitations, orthopnea and leg swelling.  Gastrointestinal: Negative for abdominal pain, constipation, diarrhea, nausea and vomiting.  Genitourinary: Negative for dysuria, frequency, hematuria and urgency.  Musculoskeletal: Negative for back pain, joint pain, myalgias and neck pain.  Skin: Negative for itching and rash.  Neurological: Negative for tingling, tremors, sensory change, speech change, focal weakness, seizures, weakness and headaches.  Psychiatric/Behavioral: Negative for memory loss, substance abuse and suicidal ideas. The patient is not nervous/anxious.       Objective:  Physical Exam   Vitals:   09/01/18 0955  BP: (!) 152/90  Pulse: 90  SpO2: 93%  Weight: 257 lb 9.6 oz (116.8 kg)  Height: 5' 1.42" (1.56 m)    RA  Gen: obese but well appearing HENT: OP clear, TM's clear, neck supple PULM: CTA B, normal percussion CV: RRR, no mgr, trace edema GI: BS+, soft, nontender Derm: no cyanosis or rash Psyche: normal mood and affect  CBC    Component Value Date/Time   WBC 4.9 04/12/2017 0939   WBC 9.2 05/17/2014 0425   RBC 4.97 04/12/2017 0939   RBC 4.69 05/17/2014 0425   HGB 11.4 (L) 04/12/2017 0939   HCT 37.7 04/12/2017 0939   PLT 183 04/12/2017 0939   MCV 75.9 (L) 04/12/2017 0939   MCH 22.9 (L) 04/12/2017 0939   MCH 21.5 (L) 05/17/2014 0425   MCHC 30.2 (L) 04/12/2017 0939   MCHC 30.1 05/17/2014 0425   RDW 17.4 (H) 04/12/2017 0939   LYMPHSABS 1.1 04/12/2017 0939   MONOABS 0.4 04/12/2017 0939   EOSABS 0.1 04/12/2017 0939    BASOSABS 0.0 04/12/2017 0939     Chest imaging: September 2018 chest x-ray images independently reviewed showing an elevated right hemidiaphragm normal pulmonary parenchyma  PFT: October 2019 ratio 80%, FEV1 1.60 L 96% predicted, 5% change with bronchodilator, total lung capacity 3.62 L 76% predicted, DLCO 6.4 mL 77% predicted  Exhaled NO: 05/2018 73 ppm  06/2018 on asmanex: 65  Labs:  Path:  Echo: February 2019 TTE: LVEF 55 to 60%, mild concentric LVH, grade 1 diastolic dysfunction, PA pressure estimate 29 mmHg, RV normal size and function  Other cardiac: March 2019 perfusion study/stress test: LVEF 62%, low risk study  Heart Catheterization:  Records from her last visit here reviewed with our nurse practitioner where she was continued on Asmanex in the setting of asthma.  Her exhaled nitric oxide test remained elevated at that time.      Assessment & Plan:   Wheezing  Mild persistent allergic asthma  Allergic rhinitis due to pollen, unspecified seasonality  Dyspnea, unspecified type  Morbid obesity due to excess calories ([place])  Discussion: Kellin still has shortness of breath which I believe is multifactorial.  I do believe she has asthma though fortunately she does not have significant airflow obstruction on lung function testing.  Given the fact that she still has symptoms of shortness of breath I think it is important to escalate therapy from an inhaled corticosteroid alone to a combination long-acting beta agonist and inhaled corticosteroid.  We will give her a sample today to see if this helps.  However, the most important thing she needs to do is exercise regularly and start losing weight as this is contributing significantly to her shortness of breath.  Plan: Moderate persistent asthma: Take Breo 1 puff daily no matter how you feel instead of Asmanex for the duration of the sample of Brio I gave you.  If that medicine is better than Asmanex please call me and  let me know.  Specifically look for improvement in shortness of breath with this medicine. Continue albuterol as needed for chest tightness wheezing or shortness of breath I am glad that your flu shot is up-to-date Practice good hand hygiene  Deconditioning/need to lose weight and exercise more: I strongly recommend that she walk 20 minutes a day at a pace that you cannot hold a conversation You should do some sort of resistance training at least twice a week: Sit to stand 10 times in a row, 3 times a day, raising 3 to 5 pound weights with straightened arms 10 times each arm 3 times a day is also helpful Joining a gym and enrolling in a structured exercise routine is a good idea as well  Allergic rhinitis: Worsening. Continue Zyrtec Start taking Flonase 2 sprays each nostril no matter how you feel, we will call in a prescription for this  We will see you  back in 3 months or sooner if needed     Current Outpatient Medications:  .  carvedilol (COREG) 3.125 MG tablet, Take 1 tablet (3.125 mg total) by mouth 2 (two) times daily., Disp: 180 tablet, Rfl: 3 .  cetirizine (ZYRTEC) 10 MG tablet, Take 10 mg by mouth daily as needed. For allergies., Disp: , Rfl:  .  cholecalciferol (VITAMIN D) 1000 units tablet, Take 2,000 Units by mouth daily., Disp: , Rfl:  .  doxazosin (CARDURA) 8 MG tablet, Take 8 mg by mouth daily., Disp: , Rfl:  .  ferrous fumarate (HEMOCYTE - 106 MG FE) 325 (106 FE) MG TABS tablet, Take 1 tablet by mouth., Disp: , Rfl:  .  glipiZIDE (GLUCOTROL) 10 MG tablet, Take 10 mg by mouth 2 (two) times daily., Disp: , Rfl:  .  losartan-hydrochlorothiazide (HYZAAR) 100-25 MG tablet, Take 1 tablet by mouth daily., Disp: , Rfl:  .  Mometasone Furoate (ASMANEX HFA) 200 MCG/ACT AERO, Inhale 2 puffs into the lungs 2 (two) times daily., Disp: 1 Inhaler, Rfl: 11 .  ranitidine (ZANTAC) 150 MG tablet, Take 150 mg by mouth 2 (two) times daily., Disp: , Rfl:

## 2018-09-01 NOTE — Addendum Note (Signed)
Addended by: Dolores Lory on: 09/01/2018 10:29 AM   Modules accepted: Orders

## 2018-09-01 NOTE — Patient Instructions (Signed)
Moderate persistent asthma: Take Breo 1 puff daily no matter how you feel instead of Asmanex for the duration of the sample of Brio I gave you.  If that medicine is better than Asmanex please call me and let me know.  Specifically look for improvement in shortness of breath with this medicine. Continue albuterol as needed for chest tightness wheezing or shortness of breath I am glad that your flu shot is up-to-date Practice good hand hygiene  Deconditioning/need to lose weight and exercise more: I strongly recommend that you walk 20 minutes a day at a pace that you cannot hold a conversation You should do some sort of resistance training at least twice a week: Sit to stand 10 times in a row, 3 times a day, raising 3 to 5 pound weights with straightened arms 10 times each arm 3 times a day is also helpful Joining a gym and enrolling in a structured exercise routine is a good idea as well  Allergic rhinitis:  Continue Zyrtec Start taking Flonase 2 sprays each nostril no matter how you feel, we will call in a prescription for this  We will see you back in 3 months or sooner if needed

## 2018-10-05 ENCOUNTER — Telehealth: Payer: Self-pay | Admitting: Pulmonary Disease

## 2018-10-05 MED ORDER — FLUTICASONE FUROATE-VILANTEROL 200-25 MCG/INH IN AEPB: 1.0000 | INHALATION_SPRAY | Freq: Every day | RESPIRATORY_TRACT | 5 refills | Status: DC

## 2018-10-05 NOTE — Telephone Encounter (Signed)
Call made to patient, made aware her refill has been sent in to [facility] understanding. Nothing further is needed at this time.

## 2018-12-01 ENCOUNTER — Ambulatory Visit: Payer: Medicare HMO | Admitting: Pulmonary Disease

## 2019-02-13 ENCOUNTER — Other Ambulatory Visit: Payer: Self-pay | Admitting: Family Medicine

## 2019-02-13 DIAGNOSIS — Z1231 Encounter for screening mammogram for malignant neoplasm of breast: Secondary | ICD-10-CM

## 2019-04-05 ENCOUNTER — Other Ambulatory Visit: Payer: Self-pay

## 2019-04-05 ENCOUNTER — Ambulatory Visit
Admission: RE | Admit: 2019-04-05 | Discharge: 2019-04-05 | Disposition: A | Discharge status: Home / Self Care | Payer: BC Managed Care – PPO | Source: Ambulatory Visit | Attending: Family Medicine | Admitting: Family Medicine

## 2019-04-05 DIAGNOSIS — Z1231 Encounter for screening mammogram for malignant neoplasm of breast: Secondary | ICD-10-CM

## 2019-07-04 ENCOUNTER — Ambulatory Visit: Payer: BC Managed Care – PPO | Admitting: Pulmonary Disease

## 2019-07-10 ENCOUNTER — Encounter: Payer: Self-pay | Admitting: Pulmonary Disease

## 2019-07-10 ENCOUNTER — Telehealth: Payer: Self-pay | Admitting: Pulmonary Disease

## 2019-07-10 ENCOUNTER — Other Ambulatory Visit: Payer: Self-pay

## 2019-07-10 ENCOUNTER — Ambulatory Visit (INDEPENDENT_AMBULATORY_CARE_PROVIDER_SITE_OTHER): Payer: BC Managed Care – PPO | Admitting: Pulmonary Disease

## 2019-07-10 VITALS — BP 122/80 | HR 90 | Temp 97.3°F | Ht 62.0 in | Wt 262.8 lb

## 2019-07-10 DIAGNOSIS — R0609 Other forms of dyspnea: Secondary | ICD-10-CM

## 2019-07-10 DIAGNOSIS — R062 Wheezing: Secondary | ICD-10-CM

## 2019-07-10 DIAGNOSIS — R0600 Dyspnea, unspecified: Secondary | ICD-10-CM | POA: Diagnosis not present

## 2019-07-10 DIAGNOSIS — J4530 Mild persistent asthma, uncomplicated: Secondary | ICD-10-CM | POA: Diagnosis not present

## 2019-07-10 MED ORDER — BREO ELLIPTA 200-25 MCG/INH IN AEPB: 1.0000 | INHALATION_SPRAY | Freq: Every day | RESPIRATORY_TRACT | 3 refills | Status: DC

## 2019-07-10 MED ORDER — BREO ELLIPTA 200-25 MCG/INH IN AEPB: 1.0000 | INHALATION_SPRAY | Freq: Every day | RESPIRATORY_TRACT | 5 refills | Status: DC

## 2019-07-10 MED ORDER — ALBUTEROL SULFATE HFA 108 (90 BASE) MCG/ACT IN AERS: 2.0000 | INHALATION_SPRAY | Freq: Four times a day (QID) | RESPIRATORY_TRACT | 11 refills | Status: DC | PRN

## 2019-07-10 MED ORDER — BREO ELLIPTA 200-25 MCG/INH IN AEPB: 1.0000 | INHALATION_SPRAY | Freq: Every day | RESPIRATORY_TRACT | 0 refills | Status: DC

## 2019-07-10 NOTE — Patient Instructions (Signed)
Asthma with controlled symptoms Postnasal drip, congestion-controlled symptoms  We will see whether Memory Dance is covered Otherwise continue Asmanex  Encourage you to include graded exercises on a regular basis  I will see you back in the office in about 6 months  Call with significant concerns

## 2019-07-10 NOTE — Telephone Encounter (Signed)
Spoke with pt. She is needing a prescription for Breo sent to her pharmacy. Rx has been sent in. Nothing further was needed.

## 2019-07-10 NOTE — Progress Notes (Signed)
Subjective:    Patient ID: [name], female    DOB: 06/26/1951, 68 y.o.   MRN: [id number]  Patient with a history of asthma Followed with Dr. Lake Bells on a regular basis  Patient with a history of asthma Currently on Asmanex She was tried on Breo-did help but was not covered by insurance and was very expensive History of allergies, sinus congestion, postnasal drip-uses Flonase She does use Zyrtec periodically History of GERD-controlled symptoms  She does get short of breath with moderate to significant exertion Exercise tolerance may be up to about half a mile She does get more short of breath with lifting any objects or walking or carrying anything  Has been healthy since her last visit  Multiple comorbidities including hypertension, hyperlipidemia, type 2 diabetes, anemia, GERD, chronic kidney disease, history of right breast cancer.  History of diastolic dysfunction.   Review of Systems  Respiratory: Positive for shortness of breath and wheezing.    Past Medical History:  Diagnosis Date  . Abnormal electrocardiogram (ECG) (EKG)   . Arthritis   . Blood transfusion 03/14/12  . Bradycardia   . Breast cancer ([place]) 03/16/12   right lumpectomy=high grade ductal ca in situ,ER/PR=positive  . Cancer ([place])    right breast ductal carcinoma in situ  . Chronic kidney disease    stage III   . Diabetes mellitus    type 2  . Dyspnea   . Edema   . GERD (gastroesophageal reflux disease)   . Hiatal hernia    large  . Hypercholesterolemia   . Hypertension    Echocardiogram 03/2011: EF 28-00%, grade 1 diastolic dysfunction, mild MAC, mild LAE  . Iron deficiency anemia   . Morbid obesity ([place])   . S/P radiation therapy 05/16/12 - 06/29/12   Right Breast: 50 Gray/ 25 Fractions with Boost: 10 Gray/ 5 Fractions  . Shortness of breath    with exertion  . Sinus drainage   . Use of anastrozole (Arimidex) Started 10/13   Family History  Problem Relation Age of Onset  . Heart attack  Father 71  . Hypertension Mother   . Diabetes Mother   . Cancer Sister        breast  . Lymphoma Sister        Deceased at age 83   Social History   Socioeconomic History  . Marital status: Married    Spouse name: Not on file  . Number of children: 1  . Years of education: Not on file  . Highest education level: Not on file  Occupational History    Employer: [employer]  Social Needs  . Financial resource strain: Not on file  . Food insecurity    Worry: Not on file    Inability: Not on file  . Transportation needs    Medical: Not on file    Non-medical: Not on file  Tobacco Use  . Smoking status: Never Smoker  . Smokeless tobacco: Never Used  Substance and Sexual Activity  . Alcohol use: No  . Drug use: No  . Sexual activity: Not on file    Comment: menarche age 25,last menses age 36,g1,p1,first live birth age 55  Lifestyle  . Physical activity    Days per week: Not on file    Minutes per session: Not on file  . Stress: Not on file  Relationships  . Social Herbalist on phone: Not on file    Gets together: Not on  file    Attends religious service: Not on file    Active member of club or organization: Not on file    Attends meetings of clubs or organizations: Not on file    Relationship status: Not on file  . Intimate partner violence    Fear of current or ex partner: Not on file    Emotionally abused: Not on file    Physically abused: Not on file    Forced sexual activity: Not on file  Other Topics Concern  . Not on file  Social History Narrative  . Not on file       Objective:   Physical Exam Constitutional:      Appearance: She is obese.  HENT:     Head: Normocephalic and atraumatic.     Nose: No congestion.  Eyes:     General:        Right eye: No discharge.        Left eye: No discharge.     Pupils: Pupils are equal, round, and reactive to light.  Neck:     Musculoskeletal: No neck rigidity or muscular tenderness.   Cardiovascular:     Rate and Rhythm: Normal rate and regular rhythm.     Heart sounds: No murmur.  Pulmonary:     Effort: Pulmonary effort is normal. No respiratory distress.     Breath sounds: No stridor. No wheezing, rhonchi or rales.  Abdominal:     General: There is no distension.  Musculoskeletal: Normal range of motion.  Skin:    General: Skin is warm.     Coloration: Skin is not jaundiced.  Neurological:     General: No focal deficit present.     Mental Status: She is alert.    Vitals:   07/10/19 0930 07/10/19 0931  BP:  122/80  Pulse:  90  Temp: (!) 97.3 F (36.3 C)   SpO2:  93%   PFT November 2019-no obstruction, no restriction, normal diffusing capacity Echocardiogram February 2019-diastolic dysfunction     Assessment & Plan:  [name]  Dyspnea -Multifactorial -May be related to asthma -Deconditioning -Asthma  .  Mild persistent asthma -Breo helps better but not able to afford medication has its not covered by insurance -We will look into reinitiating Breo -If it is not covered then she will go back to using Asmanex on a regular basis -Albuterol use as needed  .  Morbid obesity -Diet and exercise -Importance of graded exercise discussed -She does have limitations with respect to bilateral knee arthritis  Shortness of breath appears to be multifactorial Risk factor modification regarding diastolic dysfunction discussed   Encouraged to call with any significant concerns We will see him back in the office in about 6 months

## 2019-08-16 ENCOUNTER — Telehealth: Payer: Self-pay | Admitting: Pulmonary Disease

## 2019-08-16 MED ORDER — ASMANEX HFA 200 MCG/ACT IN AERO: 2.0000 | INHALATION_SPRAY | Freq: Two times a day (BID) | RESPIRATORY_TRACT | 11 refills | Status: DC

## 2019-08-16 NOTE — Telephone Encounter (Signed)
Spoke with the pt  She states that her Memory Dance cost too much and she wants to get back on the asmanex  Rx was sent- ok per last avs  Nothing further needed

## 2019-09-06 ENCOUNTER — Other Ambulatory Visit: Payer: Self-pay | Admitting: Obstetrics and Gynecology

## 2019-09-06 ENCOUNTER — Ambulatory Visit
Admission: RE | Admit: 2019-09-06 | Discharge: 2019-09-06 | Disposition: A | Discharge status: Home / Self Care | Payer: BC Managed Care – PPO | Source: Ambulatory Visit | Attending: Obstetrics and Gynecology | Admitting: Obstetrics and Gynecology

## 2019-09-06 DIAGNOSIS — R0602 Shortness of breath: Secondary | ICD-10-CM

## 2019-09-28 ENCOUNTER — Ambulatory Visit: Payer: BC Managed Care – PPO | Admitting: Cardiology

## 2019-10-06 ENCOUNTER — Ambulatory Visit: Payer: BC Managed Care – PPO | Admitting: Cardiology

## 2019-10-24 ENCOUNTER — Encounter: Payer: Self-pay | Admitting: Family Medicine

## 2019-10-24 DIAGNOSIS — H524 Presbyopia: Secondary | ICD-10-CM | POA: Diagnosis not present

## 2019-10-24 LAB — HM DIABETES EYE EXAM

## 2019-12-26 ENCOUNTER — Ambulatory Visit (INDEPENDENT_AMBULATORY_CARE_PROVIDER_SITE_OTHER): Payer: Medicare HMO | Admitting: Family Medicine

## 2019-12-26 ENCOUNTER — Encounter: Payer: Self-pay | Admitting: Family Medicine

## 2019-12-26 ENCOUNTER — Other Ambulatory Visit: Payer: Self-pay

## 2019-12-26 VITALS — BP 144/90 | HR 100 | Ht 62.0 in | Wt 262.0 lb

## 2019-12-26 DIAGNOSIS — M8580 Other specified disorders of bone density and structure, unspecified site: Secondary | ICD-10-CM | POA: Diagnosis not present

## 2019-12-26 DIAGNOSIS — I10 Essential (primary) hypertension: Secondary | ICD-10-CM

## 2019-12-26 DIAGNOSIS — E559 Vitamin D deficiency, unspecified: Secondary | ICD-10-CM

## 2019-12-26 DIAGNOSIS — K219 Gastro-esophageal reflux disease without esophagitis: Secondary | ICD-10-CM

## 2019-12-26 DIAGNOSIS — D509 Iron deficiency anemia, unspecified: Secondary | ICD-10-CM

## 2019-12-26 DIAGNOSIS — R0602 Shortness of breath: Secondary | ICD-10-CM

## 2019-12-26 DIAGNOSIS — E119 Type 2 diabetes mellitus without complications: Secondary | ICD-10-CM

## 2019-12-26 DIAGNOSIS — G4733 Obstructive sleep apnea (adult) (pediatric): Secondary | ICD-10-CM

## 2019-12-26 DIAGNOSIS — M1711 Unilateral primary osteoarthritis, right knee: Secondary | ICD-10-CM

## 2019-12-26 DIAGNOSIS — J301 Allergic rhinitis due to pollen: Secondary | ICD-10-CM | POA: Diagnosis not present

## 2019-12-26 DIAGNOSIS — J4520 Mild intermittent asthma, uncomplicated: Secondary | ICD-10-CM

## 2019-12-26 DIAGNOSIS — R609 Edema, unspecified: Secondary | ICD-10-CM

## 2019-12-26 DIAGNOSIS — E785 Hyperlipidemia, unspecified: Secondary | ICD-10-CM

## 2019-12-26 DIAGNOSIS — Z7689 Persons encountering health services in other specified circumstances: Secondary | ICD-10-CM | POA: Diagnosis not present

## 2019-12-26 DIAGNOSIS — D0511 Intraductal carcinoma in situ of right breast: Secondary | ICD-10-CM

## 2019-12-26 DIAGNOSIS — E1169 Type 2 diabetes mellitus with other specified complication: Secondary | ICD-10-CM

## 2019-12-26 DIAGNOSIS — N1831 Chronic kidney disease, stage 3a: Secondary | ICD-10-CM

## 2019-12-26 DIAGNOSIS — D569 Thalassemia, unspecified: Secondary | ICD-10-CM

## 2019-12-26 MED ORDER — TETANUS-DIPHTH-ACELL PERTUSSIS 5-2.5-18.5 LF-MCG/0.5 IM SUSP: 0.5000 mL | Freq: Once | INTRAMUSCULAR | 0 refills | Status: AC

## 2019-12-26 NOTE — Patient Instructions (Addendum)
Thank you for coming to see me today. It was a pleasure to meet you.   Please go to your pharmacy ([facility] on: [address]) to receive your Tdap vaccine as your insurance would not cover it here in the clinic.  I sent request to receive your diabetic eye exam results.  For your lower extremity swelling, I recommend that you wear compression stockings every day.  They best work if you apply them first thing in the morning before your swelling begins.  I also recommend limiting your salt intake and elevating her legs whenever possible.  Please take an over-the-counter calcium supplement in addition to your vitamin D.  This helps with bone growth and prevent worsening of your osteopenia.  I would recommend that you follow-up with your cardiologist as it has been quite a while since you have seen them.  I will continue to go through your medical records and let's plan to follow-up in 2 to 3 weeks to further discuss your chronic medical conditions.  Please return sooner if you have any concerns or questions.  Please follow-up with Dr. Tarry Kos in 2-3 weeks.  If you have any questions or concerns, please do not hesitate to call the office at [phone].  Take Care,   Dr. Mina Marble, DO Resident Physician [place] [phone]

## 2019-12-26 NOTE — Progress Notes (Signed)
Subjective:    Patient ID: [name], female    DOB: [birth year], 69 y.o.   MRN: [id number]   CC: New Patient  HPI: PMHx: Past Medical History:  Diagnosis Date  . Abnormal electrocardiogram (ECG) (EKG)   . Arthritis   . Blood transfusion 03/14/12  . Bradycardia   . Breast cancer ([place]) 03/16/12   right lumpectomy=high grade ductal ca in situ,ER/PR=positive  . Cancer ([place])    right breast ductal carcinoma in situ  . Chronic kidney disease    stage III   . Diabetes mellitus    type 2  . Dyspnea   . Edema   . GERD (gastroesophageal reflux disease)   . Hiatal hernia    large  . Hypercholesterolemia   . Hypertension    Echocardiogram 03/2011: EF 63-14%, grade 1 diastolic dysfunction, mild MAC, mild LAE  . Iron deficiency anemia   . Morbid obesity ([place])   . S/P radiation therapy 05/16/12 - 06/29/12   Right Breast: 50 Gray/ 25 Fractions with Boost: 10 Gray/ 5 Fractions  . Shortness of breath    with exertion  . Sinus drainage   . Total knee replacement status 05/15/2014  . Use of anastrozole (Arimidex) Started 10/13     Surgical Hx: Past Surgical History:  Procedure Laterality Date  . Biospy Right Breast  02/01/12   Right Breast Needle Core Biopsy: ductal Carcinoma In situ with Papillary Features, ER/PR Positive  . BREAST EXCISIONAL BIOPSY     left 1998  . BREAST LUMPECTOMY  04/1997  . Lumpectomy Right Breast  03/16/12   Ductal carcinoma In situ: clear margins: 0/1 Node Negative  . TOTAL KNEE ARTHROPLASTY Right 05/15/2014   Procedure: RIGHT TOTAL KNEE ARTHROPLASTY;  Surgeon: Tobi Bastos, MD;  Location: [facility];  Service: Orthopedics;  Laterality: Right;     Family Hx: Family History  Problem Relation Age of Onset  . Heart attack Father 28  . Hypertension Mother   . Diabetes Mother   . Cancer Sister        breast  . 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 She has never used smokeless tobacco. She reports that she does not drink alcohol or use drugs.  Medications: Glipizide 62m BID Losartan-HCTZ 100-227mQD Doxazosin 38m34m 1 po qHS Lasix 75m72m Rosuvastatin 10mg75mMetformin 500mg 78mD Xyzal qHS Singulair 10mg Q44mA 81mg QD35mhmanex 2 puff q6 hours Albuterol 2 puffs BID  Vitamin D Iron supplement  ROS: Patient reports no  vision/ hearing changes,anorexia, weight change, fever ,adenopathy, persistant / recurrent hoarseness, swallowing issues, chest pain, edema,persistant / recurrent cough, hemoptysis, dyspnea(rest, exertional, paroxysmal nocturnal), gastrointestinal  bleeding (melena, rectal bleeding), abdominal pain, excessive heart burn, GU symptoms(dysuria, hematuria, pyuria, voiding/incontinence  Issues) syncope, focal weakness, severe memory loss, concerning skin lesions, depression, anxiety, abnormal bruising/bleeding, major joint swelling, breast masses or abnormal vaginal bleeding.    Preventative Screening Colonoscopy: 2012 -  diverticulosis and hiatal hernia Mammogram: 03/2019 - negative Pap test: no longer indicated DEXA: 2016, T-score -1.6 consistent with moderate osteopenia Tetanus vaccine:Unknown Pneumonia vaccine: 2020 Shingles vaccine: None Heart stress test: Pharmacologic myocardial perfusion stress test (12/07/17): Normal study without ischemia or scar. LVEF 62%.  ETT (03/01/12): Low risk study. No EKG changes. Poor exercise tolerance. Echocardiogram: 10/2017 - Normal LV size with mild LVH. LVEF 55-60% with  normal wall motion. Grade 1 diastolic dysfunction with elevated filling pressure. Normal RV size and function. Normal pulmonary artery pressure. Mild tricuspid regurgitation. EKG: Left anterior fascicular block. Otherwise normal. Referred back to cardiologist Dr. Saunders Revel for follow up.  Xrays: none CT/MRI: none  Health Maintenance Due  Topic Date Due  . Hepatitis C Screening  Never done  . FOOT EXAM  Never done  . TETANUS/TDAP  Never done    Smoking status reviewed  Review of Systems   Objective:  BP (!) 144/90   Pulse 100   Ht _0  (1.575 m)   Wt 262 lb (118.8 kg)   SpO2 96%   BMI 47.92 kg/m  Vitals and nursing note reviewed  General: well nourished, in no acute distress HEENT: normocephalic, TM's visualized bilaterally, no scleral icterus or conjunctival pallor, no nasal discharge, moist mucous membranes, good dentition without erythema or discharge noted in posterior oropharynx Neck: supple, non-tender, without lymphadenopathy Cardiac: RRR, clear S1 and S2, no murmurs, rubs, or gallops Respiratory: clear to auscultation bilaterally, no increased work of breathing Abdomen: soft, nontender, nondistended, no masses or organomegaly. Bowel sounds present Extremities: no edema or cyanosis. Warm, well perfused. 2+ radial and PT pulses bilaterally Skin: warm and dry, no rashes noted Neuro: alert and oriented, no focal deficits  1-2+ pitting edema in LE to mid shin bilaterally Pedal pulses difficult to assess due to swelling  Prior Labs: Urine microalbumin 09/08/2019 - Urine Creatinine: 50, Microalbumin: 10, Microalbumin:Cr ratio: 200  CMP: Ca 9.5, BUN 25, Protein 7.0, Albumin 3.8, Cr 1.15, AST 10, ALT 10, K 4.2, Na 143 Lipid panel: Chol 183, Trig 83, HDL 49, LDL 119 Vitamin D 28.7 (low) CBC: WBC 5.3, RBC 5.13, Hgb 11.9 Hct 38.7, MCV 75, MCHC 30.7, Plt 241 Mag: 1.7 Vitamin B12: 909 Hgb A1C 09/06/2019: 8.0 Ferritin 10/09/19 57,   Assessment & Plan:   Chronic kidney disease,  stage III (moderate) Last CMP in Dec. 2020 notable for Cr. 1.15, eGFR 56, BUN 25 Microalb/Cr Ratio: UCr 50, UMicroalbumin: 10 Consider referral to nephrologist given CKD 3 status Avoid NSAIDs, uses tylenol for pain  Essential hypertension Currently on Losartan-HCTZ 100-25 and Doxazosin 9m qHS.  Endorses dry mouth with Doxazosin Was on Carvedilol but recently stopped per last PCP note in Dec. 2020. History of taking Amlodipine (led to LE edema), Metoprolol, and Clonidine which did not improve her BP. Last BMP with Cr 1.15, BUN 25, eGFR 59 consistent with CKD 3a.  Has home blood pressure cuff that has history of being higher than clinic BP.  Type 2 diabetes mellitus without complication, without long-term current use of insulin (HCC) Last A1C 8.0 in Jan 2021. Currently on Glipizide and Metformin. Endorses compliance. Nonsmoker, on ARB and statin. History of CKD 3a with last BMP in 08/2019. Due for repeat A1C around April. Has medicare which will limit diabetes management options. - Diabetic eye exam on 10/24/19 - no medical disease [place] [facility] [place] plan to perform diabetic eye exam at follow up - up to date on PNA vaccine  Allergic rhinitis Currently on Montelukast 86m QD, Flonase PRN for congestion, and Levocetirizine 559mqHS. Per last PCP, instructed patient to use nasal saline BID during season, then PRN.  Moderate osteopenia Last DEXA scan May 2016: Femure t-score -1.6  Consistent with moderate osteopenia.  Due for repeat DEXA scan.  Currently on Vitamin D supplement. Recommended starting daily Calcium.   Hyperlipidemia associated with type 2 diabetes mellitus [place]: Chol 183, Trig 83, HDL 49, LDL 119 Rosuvastatin recently increased to 2038mD.   Iron deficiency anemia Last iron studies on 09/2019: Last ferritin 57, Iron 38, TIBC 309, Iron sat 12 (low). Currently on iron supplement.  H/o Thalassemia per hematologist Dr.  MagJana HakimMCV will always be low because of the thalassemia EGD/Colonoscopy 03/2011 : Notable for diverticulosis and hiatal hernia. Negative for source of anemia.  Shortness of breath Chronic. Slightly improved with Furosemide - currently on 74m35m.  No history of coronary artery disease.  Has been worked up by cardiologist Dr. End Saunders Revelh negative stress testing and echo showing only grade 1 diastolic dysfunction, LVH, normal EF and valves. Felt dyspnea was multifactorial due to obesity and deconditioning.  He recommended weight loss and exercise. Evaluated by pulmonology.. Felt to be multifactorial from deconditioning, obesity, and allergic asthma (Normal PFT's, elevated FeNO). Currently taking Asmanex and as needed albuterol with some improvement in symptoms. She has history of severe sleep apnea by home sleep study and is not compliant with CPAP as she does not tolerate the mask or nasal pillows. Denies any SOB during visit.   Edema Chronic. Mix of dependent and pitting edema. Worse at the end of the day, better in the mornings. Improves with lasix. Not compliant with compression stockings. Minimal LE swelling on exam today - notes this is her baseline today.  - recommended daily use of compression stockings - limit salt intake - elevate LE's as often as possible  GERD (gastroesophageal reflux disease) Mag and B12 evaluated in 08/2019 were within normal limits. History of hiatal hernia and regurgitation. Not currently taking a PPI. Consider H2 blocker if symptomatic.  Obstructive sleep apnea Per chart review, has severe OSA. Last sleep study in 05/2017 notable for AHI 85. Not compliant with CPAP - encouraged CPAP nightly   Allergic asthma, mild intermittent, uncomplicated Followed by Pulmonology ([facility]) Has trialed Breo but unable to afford it Currently well controlled on Asmanex daily, Albuterol PRn  Health Maintenance: Will discuss at shingles vaccine at follow up visit TDAP rx  sent to pharmacy. Patient instructed to go to pharmacy to receive vaccine. Will obtain Hep C screen at follow up [place] fully vacinated against covid 19  Return in about 2 weeks (around 01/09/2020) for DM, HTN, HLD f/u.   KierDanna Hefty ConeEdinburgY2 01/05/2020 2:34 PM

## 2019-12-29 ENCOUNTER — Encounter: Payer: Self-pay | Admitting: Family Medicine

## 2019-12-29 DIAGNOSIS — E1169 Type 2 diabetes mellitus with other specified complication: Secondary | ICD-10-CM | POA: Insufficient documentation

## 2019-12-29 DIAGNOSIS — E559 Vitamin D deficiency, unspecified: Secondary | ICD-10-CM | POA: Insufficient documentation

## 2019-12-29 NOTE — Assessment & Plan Note (Addendum)
Last A1C 8.0 in Jan 2021. Currently on Glipizide and Metformin. Endorses compliance. Nonsmoker, on ARB and statin. History of CKD 3a with last BMP in 08/2019. Due for repeat A1C around April. Has medicare which will limit diabetes management options. - Diabetic eye exam on 10/24/19 - no medical disease (records obtained from [place]) - plan to perform diabetic eye exam at follow up - up to date on PNA vaccine

## 2019-12-29 NOTE — Assessment & Plan Note (Addendum)
Currently on Montelukast 10mg  QD, Flonase PRN for congestion, and Levocetirizine 5mg  qHS. Per last PCP, instructed patient to use nasal saline BID during season, then PRN.

## 2019-12-29 NOTE — Assessment & Plan Note (Addendum)
Last CMP in Dec. 2020 notable for Cr. 1.15, eGFR 56, BUN 25 Microalb/Cr Ratio: UCr 50, UMicroalbumin: 10 Consider referral to nephrologist given CKD 3 status Avoid NSAIDs, uses tylenol for pain

## 2019-12-29 NOTE — Assessment & Plan Note (Signed)
Last lipid panel in Dec 2020: Chol 183, Trig 83, HDL 49, LDL 119 Rosuvastatin recently increased to 20mg  QD.

## 2019-12-29 NOTE — Assessment & Plan Note (Addendum)
Last DEXA scan May 2016: Femure t-score -1.6  Consistent with moderate osteopenia.  Due for repeat DEXA scan.  Currently on Vitamin D supplement. Recommended starting daily Calcium.

## 2019-12-29 NOTE — Assessment & Plan Note (Addendum)
Currently on Losartan-HCTZ 100-25 and Doxazosin 9m qHS.  Endorses dry mouth with Doxazosin Was on Carvedilol but recently stopped per last PCP note in Dec. 2020. History of taking Amlodipine (led to LE edema), Metoprolol, and Clonidine which did not improve her BP. Last BMP with Cr 1.15, BUN 25, eGFR 59 consistent with CKD 3a.  Has home blood pressure cuff that has history of being higher than clinic BP.

## 2020-01-05 DIAGNOSIS — G4733 Obstructive sleep apnea (adult) (pediatric): Secondary | ICD-10-CM | POA: Insufficient documentation

## 2020-01-05 DIAGNOSIS — R609 Edema, unspecified: Secondary | ICD-10-CM | POA: Insufficient documentation

## 2020-01-05 DIAGNOSIS — K219 Gastro-esophageal reflux disease without esophagitis: Secondary | ICD-10-CM | POA: Insufficient documentation

## 2020-01-05 NOTE — Assessment & Plan Note (Addendum)
Chronic. Mix of dependent and pitting edema. Worse at the end of the day, better in the mornings. Improves with lasix. Not compliant with compression stockings. Minimal LE swelling on exam today - notes this is her baseline today.  - recommended daily use of compression stockings - limit salt intake - elevate LE's as often as possible

## 2020-01-05 NOTE — Assessment & Plan Note (Addendum)
Chronic. Slightly improved with Furosemide - currently on 20mg  QD.  No history of coronary artery disease.  Has been worked up by cardiologist Dr. Saunders Revel with negative stress testing and echo showing only grade 1 diastolic dysfunction, LVH, normal EF and valves. Felt dyspnea was multifactorial due to obesity and deconditioning.  He recommended weight loss and exercise. Evaluated by pulmonology.. Felt to be multifactorial from deconditioning, obesity, and allergic asthma (Normal PFT's, elevated FeNO). Currently taking Asmanex and as needed albuterol with some improvement in symptoms. She has history of severe sleep apnea by home sleep study and is not compliant with CPAP as she does not tolerate the mask or nasal pillows. Denies any SOB during visit.

## 2020-01-05 NOTE — Assessment & Plan Note (Signed)
Mag and B12 evaluated in 08/2019 were within normal limits. History of hiatal hernia and regurgitation. Not currently taking a PPI. Consider H2 blocker if symptomatic.

## 2020-01-05 NOTE — Assessment & Plan Note (Addendum)
Per chart review, has severe OSA. Last sleep study in 05/2017 notable for AHI 85. Not compliant with CPAP - encouraged CPAP nightly

## 2020-01-05 NOTE — Assessment & Plan Note (Signed)
Followed by Pulmonology ([facility]) Has trialed Breo but unable to afford it Currently well controlled on Asmanex daily, Albuterol PRn

## 2020-01-05 NOTE — Assessment & Plan Note (Addendum)
Last iron studies on 09/2019: Last ferritin 57, Iron 38, TIBC 309, Iron sat 12 (low). Currently on iron supplement.  H/o Thalassemia per hematologist Dr. Jana Hakim - MCV will always be low because of the thalassemia EGD/Colonoscopy 03/2011 : Notable for diverticulosis and hiatal hernia. Negative for source of anemia.

## 2020-01-09 ENCOUNTER — Ambulatory Visit: Payer: Medicare HMO | Admitting: Family Medicine

## 2020-02-09 ENCOUNTER — Ambulatory Visit: Payer: Medicare HMO | Admitting: Pulmonary Disease

## 2020-02-09 ENCOUNTER — Other Ambulatory Visit: Payer: Self-pay

## 2020-02-09 ENCOUNTER — Encounter: Payer: Self-pay | Admitting: Pulmonary Disease

## 2020-02-09 VITALS — BP 140/80 | HR 101 | Temp 97.7°F | Ht 62.0 in | Wt 267.0 lb

## 2020-02-09 DIAGNOSIS — R0902 Hypoxemia: Secondary | ICD-10-CM

## 2020-02-09 DIAGNOSIS — R0602 Shortness of breath: Secondary | ICD-10-CM

## 2020-02-09 NOTE — Patient Instructions (Signed)
We will check your oxygen levels today to see if you need oxygen  Work on staying on Breo  We will get an echocardiogram  We will see you in about 3 months  Graded exercises as tolerated

## 2020-02-09 NOTE — Progress Notes (Signed)
Subjective:    Patient ID: [name], female    DOB: 10/15/1950, 69 y.o.   MRN: [id number]  Patient with a history of asthma Followed with Dr. Lake Bells on a regular basis  Patient with a history of asthma Currently on Asmanex She was tried on Breo-did help but was not covered by insurance and was very expensive-Breo did work better than Asmanex She will see whether she is able to procure Breo and stay on it  History of allergies, sinus congestion, postnasal drip-uses Flonase She does use Zyrtec periodically History of GERD-controlled symptoms  She does get short of breath with moderate to significant exertion Exercise tolerance may be up to about half a mile She does get more short of breath with lifting any objects or walking or carrying anything Was treated for pneumonia in December  Multiple comorbidities including hypertension, hyperlipidemia, type 2 diabetes, anemia, GERD, chronic kidney disease, history of right breast cancer.  History of diastolic dysfunction.   Review of Systems  Respiratory: Positive for shortness of breath and wheezing.    Past Medical History:  Diagnosis Date  . Abnormal electrocardiogram (ECG) (EKG)   . Arthritis   . Blood transfusion 03/14/12  . Bradycardia   . Breast cancer ([place]) 03/16/12   right lumpectomy=high grade ductal ca in situ,ER/PR=positive  . Cancer ([place])    right breast ductal carcinoma in situ  . Chronic kidney disease    stage III   . Diabetes mellitus    type 2  . Dyspnea   . Edema   . GERD (gastroesophageal reflux disease)   . Hiatal hernia    large  . Hypercholesterolemia   . Hypertension    Echocardiogram 03/2011: EF 58-30%, grade 1 diastolic dysfunction, mild MAC, mild LAE  . Iron deficiency anemia   . Morbid obesity ([place])   . S/P radiation therapy 05/16/12 - 06/29/12   Right Breast: 50 Gray/ 25 Fractions with Boost: 10 Gray/ 5 Fractions  . Shortness of breath    with exertion  . Sinus drainage   . Total knee  replacement status 05/15/2014  . Use of anastrozole (Arimidex) Started 10/13   Family History  Problem Relation Age of Onset  . Heart attack Father 58  . Hypertension Mother   . Diabetes Mother   . Cancer Sister        breast  . Lymphoma Sister        Deceased at age 87   Social History   Socioeconomic History  . Marital status: Married    Spouse name: Not on file  . Number of children: 1  . Years of education: Not on file  . Highest education level: Not on file  Occupational History    Employer: [employer]  Tobacco Use  . Smoking status: Never Smoker  . Smokeless tobacco: Never Used  Substance and Sexual Activity  . Alcohol use: No  . Drug use: No  . Sexual activity: Not on file    Comment: menarche age 69,last menses age 49,g1,p1,first live birth age 67  Other Topics Concern  . Not on file  Social History Narrative  . Not on file   Social Determinants of Health   Financial Resource Strain:   . Difficulty of Paying Living Expenses:   Food Insecurity:   . Worried About Charity fundraiser in the Last Year:   . [place] in the Last Year:   Transportation Needs:   . Lack of  Transportation (Medical):   [name] Lack of Transportation (Non-Medical):   Physical Activity:   . Days of Exercise per Week:   . Minutes of Exercise per Session:   Stress:   . Feeling of Stress :   Social Connections:   . Frequency of Communication with Friends and Family:   . Frequency of Social Gatherings with Friends and Family:   . Attends Religious Services:   . Active Member of Clubs or Organizations:   . Attends Archivist Meetings:   [name] Marital Status:   Intimate Partner Violence:   . Fear of Current or Ex-Partner:   . Emotionally Abused:   [name] Physically Abused:   . Sexually Abused:        Objective:   Physical Exam Constitutional:      Appearance: She is obese.  HENT:     Head: Normocephalic and atraumatic.     Nose: No congestion.  Eyes:      General:        Right eye: No discharge.        Left eye: No discharge.     Pupils: Pupils are equal, round, and reactive to light.  Cardiovascular:     Rate and Rhythm: Normal rate and regular rhythm.     Heart sounds: No murmur.  Pulmonary:     Effort: Pulmonary effort is normal. No respiratory distress.     Breath sounds: No stridor. No wheezing, rhonchi or rales.  Musculoskeletal:     Cervical back: No rigidity. No muscular tenderness.  Neurological:     Mental Status: She is alert.    Vitals:   02/09/20 1005  BP: 140/80  Pulse: (!) 101  Temp: 97.7 F (36.5 C)  SpO2: (!) 84%   PFT November 2019-no obstruction, no restriction, normal diffusing capacity Echocardiogram February 2019-diastolic dysfunction     Assessment & Plan:  [name]  Dyspnea -Multifactorial -Deconditioning  Hypoxemia noted today -We will check ambulatory oxygen requirement  .  Mild persistent asthma -Breo helps better but not able to afford medication has its not covered by insurance -She will look into being able to procure Breo  .  Morbid obesity -Diet and exercise -Importance of graded exercise discussed -She does have limitations with respect to bilateral knee arthritis  Risk factor modification is still significant and will be consequential in managing shortness of breath   Graded exercise as tolerated  We will check an echocardiogram  I will see her back in about 3 months

## 2020-02-13 ENCOUNTER — Telehealth: Payer: Self-pay | Admitting: Pulmonary Disease

## 2020-02-13 MED ORDER — BREO ELLIPTA 200-25 MCG/INH IN AEPB: 1.0000 | INHALATION_SPRAY | Freq: Every day | RESPIRATORY_TRACT | 5 refills | Status: DC

## 2020-02-13 MED ORDER — ALBUTEROL SULFATE HFA 108 (90 BASE) MCG/ACT IN AERS: 2.0000 | INHALATION_SPRAY | Freq: Four times a day (QID) | RESPIRATORY_TRACT | 5 refills | Status: DC | PRN

## 2020-02-13 NOTE — Telephone Encounter (Signed)
Spoke with pt. She is needing prescription refills for Breo and Albuterol HFA. Both prescriptions have been sent in. Nothing further was needed.

## 2020-02-15 DIAGNOSIS — M25562 Pain in left knee: Secondary | ICD-10-CM | POA: Diagnosis not present

## 2020-02-15 DIAGNOSIS — M1712 Unilateral primary osteoarthritis, left knee: Secondary | ICD-10-CM | POA: Diagnosis not present

## 2020-02-19 NOTE — Progress Notes (Signed)
Subjective:   Patient ID: [name]    DOB: 1951-01-07, 69 y.o. female   MRN: [id number]  [name] is a 69 y.o. female with a history of HTN, OSA, allergic rhinitis, allergic asthma, GERD, T2DM, HLD, moderate osteopenia, CKD III, Vitamin D deficiency, thalassemia, morbid obesity, iron deficiency anemia, h/o breat cancer in remission here for chronic care management.  Vitamin D Deficiency:  Last Vitamin D level 08/2019 was low at 28.7. Currently on Vitamin D supplementation.   CKD III: Last urine microalbumin:Cr Ratio 200. Currently on Losartan-HCTZ.   Moderate Osteopenia: Last DEXA scan 2016 with t-score of -1.6 consistent with moderate osteopenia. Currently on Calcium and Vitamin D supplementation. Due for DEXA scan. Denies any falls or recent fractures.  Diabetes: Last A1C 8.0 in Jan 2021. Currently on Glipizide 83m QD and Metformin 5070mER. Notes she only takes it 3-4 time a week. Endorses compliance. Notes times of low blood sugar when she doesn't eat, which improves when she eats. Denies any polyuria, polydipsia, polyphagia. Due for diabetic foot exam.  Lab Results  Component Value Date   HGBA1C 8.2 (A) 02/20/2020   HGBA1C 7.1 (H) 04/08/2011    HTN:  BP: 134/90 today. Currently on Losartan-HCTZ 100-2542mD and Doxazosin 3mg39mS. History of intolerance/lack of BP improvement with Amlodipine, Metoprolol, Clonidine, and Carvedilol. Endorses compliance. Denies any chest pain, SOB, vision changes, or headaches.   HLD: Last lipid panel in 08/2019 with Chol 183, Trig 83, HDL 49, and LDL 119. Goal LDL <70. Currently on Rosuvastatin 20mg17m Endorses compliance. Denies any muscles aches or weakness.  Lab Results  Component Value Date   CHOL 135 02/20/2020   HDL 46 02/20/2020   LDLCALC 73 02/20/2020   TRIG 80 02/20/2020   CHOLHDL 2.9 02/20/2020   Health Maintenance: Due for Hep C screening, diabetic foot exam, TDAP  Review of Systems:  Per HPI.   Objective:   BP  134/90   Pulse 96   Ht '5\' 2"'  (1.575 m)   Wt 265 lb 4 oz (120.3 kg)   SpO2 93% Comment: PROVIDER INFORMED  BMI 48.51 kg/m  Vitals and nursing note reviewed.  General: pleasant older lady, sitting comfortably in exam chair, well nourished, well developed, in no acute distress with non-toxic appearance CV: regular rate and rhythm without murmurs, rubs, or gallops, 2+ pitting edema bilaterally Lungs: clear to auscultation bilaterally with normal work of breathing Abdomen: soft Skin: warm, dry, few nummular excoriations on lower chins bilaterally, no erythema, warmth, bleeding or discharge. No signs of infection Extremities: warm and well perfused MSK: gait normal Neuro: Alert and oriented, speech normal  Diabetic foot exam was performed with the following findings:   No deformities, ulcerations, or other skin breakdown Normal sensation of 10g monofilament Intact posterior tibialis and dorsalis pedis pulses    Assessment & Plan:   Essential hypertension Normotensive today. Tolerating Losartan-HCTZ. Kidney function now CKD IIIb. Will discuss discontinuation of HCTZ, although limited on alternative options. Follow up in 3 months.  - continue Losartan-HCTZ  Chronic kidney disease, stage III (moderate) Cr worsening compared to last CMP in 08/2019 from 1.15>1.5. eGFR 38.  Refer to nephrology to establish care. Start [place] on Losartan.  Consider discontinuation of HCTZ, will plan to further discuss this with patient.  Avoid NSAIDs, use tylenol for pain  Moderate osteopenia Last DEXA scan available to see was in 2016. Unsure if patient has had another one since (none in record). Will obtain repeat  DEXA scan for continued monitoring.  - continue Vitamin D 1000IU daily, 1244m Calcium daily   Iron deficiency anemia Hgb stable at 11.6. History of Thalassemia pre hematologist Dr. MJana Hakim MCV will always sbe low. MCV 78 consistent with known thalassemia.  - continue iron  supplement  Vitamin D deficiency Continue Vitamin D supplement.  Will obtain Vitamin D level at follow up visit.  Type 2 diabetes mellitus without complication, without long-term current use of insulin (HCC) Chronic, uncontrolled. A1C slightly worse at 8.2 today. Currently on Glipizide and occasional use of Metformin 509mER due to soft stools.  - Will transition to Farxiga 1018mD, stop Glipizide, and continue daily Metformin 500m10m.  - Patient counseled on taking Metformin with full meal.  - Educated on potential side effects of Farxiga and instructed to drink plenty of water.  - Repeat BMP in 1 month to monitor kidney function.  - diabetic foot exam performed today and WNL - Follow up in 3 months for repeat A1C - Consider addition of GLP-1 if indicated at follow up.  Hyperlipidemia associated with type 2 diabetes mellitus (HCC) LDL 73. Goal <70. Currently on Rosuvastatin 20mg95m Continue current medications at this time. Will focus on managing uncontrolled diabetes. Can discuss increased Rosuvastatin at follow up visit.   Health Maintenance: Hep C obtained and negative. Patient to obtain TDAP vaccine at pharmacy. Discussed with patient. Diabetic foot exam obtained and WNL.   Orders Placed This Encounter  Procedures  . DG Bone Density    AETNA PF: 02/07/15'@BCG'  Wt     Standing Status:   Future    Standing Expiration Date:   02/18/2021    Order Specific Question:   Reason for Exam (SYMPTOM  OR DIAGNOSIS REQUIRED)    Answer:   moderate osteopneia    Order Specific Question:   [facility]?    [facility]  . Lipid panel  . Comprehensive metabolic panel  . CBC  . POCT glycosylated hemoglobin (Hb A1C)   Meds ordered this encounter  Medications  . dapagliflozin propanediol (FARXIGA) 10 MG TABS tablet    Sig: Take 1 tablet (10 mg total) by mouth daily.    Dispense:  90 tablet    Refill:  3  .  losartan-hydrochlorothiazide (HYZAAR) 100-25 MG tablet    Sig: Take 1 tablet by mouth daily.    Dispense:  90 tablet    Refill:  2   Follow up 1 month for repeat BMP to monitor kidney function  KiersMina MarblePGY-2, [facility] 02/22/2020 11:23 AM

## 2020-02-20 ENCOUNTER — Ambulatory Visit (INDEPENDENT_AMBULATORY_CARE_PROVIDER_SITE_OTHER): Payer: Medicare HMO | Admitting: Family Medicine

## 2020-02-20 ENCOUNTER — Other Ambulatory Visit: Payer: Self-pay

## 2020-02-20 ENCOUNTER — Encounter: Payer: Self-pay | Admitting: Family Medicine

## 2020-02-20 VITALS — BP 134/90 | HR 96 | Ht 62.0 in | Wt 265.2 lb

## 2020-02-20 DIAGNOSIS — M8580 Other specified disorders of bone density and structure, unspecified site: Secondary | ICD-10-CM

## 2020-02-20 DIAGNOSIS — E785 Hyperlipidemia, unspecified: Secondary | ICD-10-CM

## 2020-02-20 DIAGNOSIS — E1169 Type 2 diabetes mellitus with other specified complication: Secondary | ICD-10-CM

## 2020-02-20 DIAGNOSIS — D509 Iron deficiency anemia, unspecified: Secondary | ICD-10-CM

## 2020-02-20 DIAGNOSIS — E119 Type 2 diabetes mellitus without complications: Secondary | ICD-10-CM | POA: Diagnosis not present

## 2020-02-20 DIAGNOSIS — I10 Essential (primary) hypertension: Secondary | ICD-10-CM | POA: Diagnosis not present

## 2020-02-20 DIAGNOSIS — Z1159 Encounter for screening for other viral diseases: Secondary | ICD-10-CM | POA: Diagnosis not present

## 2020-02-20 DIAGNOSIS — N1831 Chronic kidney disease, stage 3a: Secondary | ICD-10-CM

## 2020-02-20 DIAGNOSIS — E559 Vitamin D deficiency, unspecified: Secondary | ICD-10-CM

## 2020-02-20 LAB — POCT GLYCOSYLATED HEMOGLOBIN (HGB A1C): HbA1c, POC (controlled diabetic range): 8.2 % — AB (ref 0.0–7.0)

## 2020-02-20 MED ORDER — LOSARTAN POTASSIUM-HCTZ 100-25 MG PO TABS: 1.0000 | ORAL_TABLET | Freq: Every day | ORAL | 2 refills | Status: DC

## 2020-02-20 MED ORDER — DAPAGLIFLOZIN PROPANEDIOL 10 MG PO TABS: 10.0000 mg | ORAL_TABLET | Freq: Every day | ORAL | 3 refills | Status: DC

## 2020-02-20 NOTE — Patient Instructions (Signed)
Thank you for coming to see me today. It was a pleasure to see you.   I would like for you to stop Glipizide and start [place] (dapagliflozen). You will take this medicine daily. It will make you pee more. It will be very important for you to drink plenty of water to help prevent yeast infections.  Continue you cholesterol and blood pressure medicines.  I have ordered a bone density scan to evaluate for osteoporosis. I will call you with the results.  I am also getting other labs such as vitamin D level, cholesterol, kidney and other blood work. I will call you with those results.   Please follow-up with me in 3 months for diabetes recheck.  PLEASE call me when start the new medicine. I will then schedule you for a 1 month lab appointment.   If you have any questions or concerns, please do not hesitate to call the office at [phone].  Take Care,   Dr. Mina Marble, DO Resident Physician [place] [phone]

## 2020-02-21 LAB — CBC
Hematocrit: 39 % (ref 34.0–46.6)
Hemoglobin: 11.6 g/dL (ref 11.1–15.9)
MCH: 23.3 pg — ABNORMAL LOW (ref 26.6–33.0)
MCHC: 29.7 g/dL — ABNORMAL LOW (ref 31.5–35.7)
MCV: 78 fL — ABNORMAL LOW (ref 79–97)
Platelets: 228 10*3/uL (ref 150–450)
RBC: 4.98 x10E6/uL (ref 3.77–5.28)
RDW: 15.5 % — ABNORMAL HIGH (ref 11.7–15.4)
WBC: 5.9 10*3/uL (ref 3.4–10.8)

## 2020-02-21 LAB — COMPREHENSIVE METABOLIC PANEL
ALT: 9 IU/L (ref 0–32)
AST: 11 IU/L (ref 0–40)
Albumin/Globulin Ratio: 1.3 (ref 1.2–2.2)
Albumin: 3.9 g/dL (ref 3.7–4.7)
Alkaline Phosphatase: 79 IU/L (ref 48–121)
BUN/Creatinine Ratio: 17 (ref 12–28)
BUN: 26 mg/dL (ref 8–27)
Bilirubin Total: <0.2 mg/dL (ref 0.0–1.2)
CO2: 27 mmol/L (ref 20–29)
Calcium: 9.9 mg/dL (ref 8.7–10.3)
Chloride: 100 mmol/L (ref 96–106)
Creatinine, Ser: 1.56 mg/dL — ABNORMAL HIGH (ref 0.57–1.00)
GFR calc Af Amer: 38 mL/min/{1.73_m2} — ABNORMAL LOW (ref >59)
GFR calc non Af Amer: 33 mL/min/{1.73_m2} — ABNORMAL LOW (ref >59)
Globulin, Total: 2.9 g/dL (ref 1.5–4.5)
Glucose: 140 mg/dL — ABNORMAL HIGH (ref 65–99)
Potassium: 4 mmol/L (ref 3.5–5.2)
Sodium: 142 mmol/L (ref 134–144)
Total Protein: 6.8 g/dL (ref 6.0–8.5)

## 2020-02-21 LAB — LIPID PANEL
Chol/HDL Ratio: 2.9 ratio (ref 0.0–4.4)
Cholesterol, Total: 135 mg/dL (ref 100–199)
HDL: 46 mg/dL (ref >39)
LDL Chol Calc (NIH): 73 mg/dL (ref 0–99)
Triglycerides: 80 mg/dL (ref 0–149)
VLDL Cholesterol Cal: 16 mg/dL (ref 5–40)

## 2020-02-21 LAB — HEPATITIS C ANTIBODY: Hep C Virus Ab: <0.1 s/co ratio (ref 0.0–0.9)

## 2020-02-22 ENCOUNTER — Other Ambulatory Visit: Payer: Self-pay | Admitting: Family Medicine

## 2020-02-22 DIAGNOSIS — N1832 Chronic kidney disease, stage 3b: Secondary | ICD-10-CM

## 2020-02-22 NOTE — Assessment & Plan Note (Signed)
LDL 73. Goal <70. Currently on Rosuvastatin 20mg  QD. Continue current medications at this time. Will focus on managing uncontrolled diabetes. Can discuss increased Rosuvastatin at follow up visit.

## 2020-02-22 NOTE — Assessment & Plan Note (Signed)
Last DEXA scan available to see was in 2016. Unsure if patient has had another one since (none in record). Will obtain repeat DEXA scan for continued monitoring.  - continue Vitamin D 1000IU daily, 1200mg  Calcium daily

## 2020-02-22 NOTE — Assessment & Plan Note (Signed)
Normotensive today. Tolerating Losartan-HCTZ. Kidney function now CKD IIIb. Will discuss discontinuation of HCTZ, although limited on alternative options. Follow up in 3 months.  - continue Losartan-HCTZ

## 2020-02-22 NOTE — Assessment & Plan Note (Signed)
Continue Vitamin D supplement.  Will obtain Vitamin D level at follow up visit.

## 2020-02-22 NOTE — Assessment & Plan Note (Addendum)
Cr worsening compared to last CMP in 08/2019 from 1.15>1.5. eGFR 38.  Refer to nephrology to establish care. Start [place], currently on Losartan.  Consider discontinuation of HCTZ, will plan to further discuss this with patient.  Avoid NSAIDs, use tylenol for pain

## 2020-02-22 NOTE — Assessment & Plan Note (Addendum)
Chronic, uncontrolled. A1C slightly worse at 8.2 today. Currently on Glipizide and occasional use of Metformin 500mg  ER due to soft stools.  - Will transition to [place] 10mg  QD, stop Glipizide, and continue daily Metformin 500mg  ER.  - Patient counseled on taking Metformin with full meal.  - Educated on potential side effects of Farxiga and instructed to drink plenty of water.  - Repeat BMP in 1 month to monitor kidney function.  - diabetic foot exam performed today and WNL - Follow up in 3 months for repeat A1C - Consider addition of GLP-1 if indicated at follow up.

## 2020-02-22 NOTE — Assessment & Plan Note (Signed)
Hgb stable at 11.6. History of Thalassemia pre hematologist Dr. Jana Hakim. MCV will always sbe low. MCV 78 consistent with known thalassemia.  - continue iron supplement

## 2020-02-28 ENCOUNTER — Telehealth: Payer: Self-pay | Admitting: Family Medicine

## 2020-02-28 NOTE — Telephone Encounter (Signed)
Spoke to patient after encounter (02/22/20) to check in on her and evaluate ability to obtain medications. She notes she has chronic SOB that is thought to be due to deconditioning and continued work up is ongoing. She is to follow up if worsening. In regards to her diabetes, she was able to obtain medications and thus will continue plan as discussed in last progress note.

## 2020-03-08 ENCOUNTER — Other Ambulatory Visit: Payer: Self-pay | Admitting: Family Medicine

## 2020-03-08 DIAGNOSIS — E1169 Type 2 diabetes mellitus with other specified complication: Secondary | ICD-10-CM

## 2020-03-08 MED ORDER — ROSUVASTATIN CALCIUM 20 MG PO TABS: 20.0000 mg | ORAL_TABLET | Freq: Every day | ORAL | 3 refills | Status: DC

## 2020-03-11 ENCOUNTER — Encounter (HOSPITAL_COMMUNITY): Payer: Self-pay

## 2020-03-11 ENCOUNTER — Other Ambulatory Visit (HOSPITAL_COMMUNITY): Payer: Medicare HMO

## 2020-03-26 ENCOUNTER — Other Ambulatory Visit: Payer: Self-pay

## 2020-03-26 ENCOUNTER — Other Ambulatory Visit: Payer: Medicare HMO

## 2020-03-26 ENCOUNTER — Other Ambulatory Visit: Payer: Self-pay | Admitting: Family Medicine

## 2020-03-26 DIAGNOSIS — N1832 Chronic kidney disease, stage 3b: Secondary | ICD-10-CM

## 2020-03-26 DIAGNOSIS — E119 Type 2 diabetes mellitus without complications: Secondary | ICD-10-CM

## 2020-03-27 LAB — BASIC METABOLIC PANEL
BUN/Creatinine Ratio: 17 (ref 12–28)
BUN: 25 mg/dL (ref 8–27)
CO2: 25 mmol/L (ref 20–29)
Calcium: 9.3 mg/dL (ref 8.7–10.3)
Chloride: 105 mmol/L (ref 96–106)
Creatinine, Ser: 1.5 mg/dL — ABNORMAL HIGH (ref 0.57–1.00)
GFR calc Af Amer: 40 mL/min/{1.73_m2} — ABNORMAL LOW (ref >59)
GFR calc non Af Amer: 35 mL/min/{1.73_m2} — ABNORMAL LOW (ref >59)
Glucose: 168 mg/dL — ABNORMAL HIGH (ref 65–99)
Potassium: 4.5 mmol/L (ref 3.5–5.2)
Sodium: 144 mmol/L (ref 134–144)

## 2020-04-16 ENCOUNTER — Other Ambulatory Visit: Payer: Self-pay | Admitting: Family Medicine

## 2020-04-16 DIAGNOSIS — Z1231 Encounter for screening mammogram for malignant neoplasm of breast: Secondary | ICD-10-CM

## 2020-04-23 ENCOUNTER — Ambulatory Visit: Payer: Medicare HMO

## 2020-04-26 ENCOUNTER — Ambulatory Visit (HOSPITAL_COMMUNITY): Payer: Medicare HMO | Attending: Cardiovascular Disease

## 2020-04-26 ENCOUNTER — Other Ambulatory Visit: Payer: Self-pay

## 2020-04-26 DIAGNOSIS — R0602 Shortness of breath: Secondary | ICD-10-CM

## 2020-04-26 LAB — ECHOCARDIOGRAM COMPLETE
Area-P 1/2: 3.53 cm2
S' Lateral: 2.3 cm

## 2020-06-25 ENCOUNTER — Ambulatory Visit
Admission: RE | Admit: 2020-06-25 | Discharge: 2020-06-25 | Disposition: A | Discharge status: Home / Self Care | Payer: Medicare HMO | Source: Ambulatory Visit | Attending: Family Medicine | Admitting: Family Medicine

## 2020-06-25 ENCOUNTER — Other Ambulatory Visit: Payer: Self-pay

## 2020-06-25 DIAGNOSIS — Z1231 Encounter for screening mammogram for malignant neoplasm of breast: Secondary | ICD-10-CM

## 2020-06-25 DIAGNOSIS — M8580 Other specified disorders of bone density and structure, unspecified site: Secondary | ICD-10-CM

## 2020-06-27 ENCOUNTER — Encounter: Payer: Self-pay | Admitting: Family Medicine

## 2020-06-27 ENCOUNTER — Other Ambulatory Visit: Payer: Self-pay | Admitting: Family Medicine

## 2020-06-27 DIAGNOSIS — R928 Other abnormal and inconclusive findings on diagnostic imaging of breast: Secondary | ICD-10-CM

## 2020-06-27 DIAGNOSIS — N6320 Unspecified lump in the left breast, unspecified quadrant: Secondary | ICD-10-CM

## 2020-06-27 HISTORY — DX: Unspecified lump in the left breast, unspecified quadrant: N63.20

## 2020-07-11 ENCOUNTER — Ambulatory Visit
Admission: RE | Admit: 2020-07-11 | Discharge: 2020-07-11 | Disposition: A | Discharge status: Home / Self Care | Payer: Medicare HMO | Source: Ambulatory Visit | Attending: Family Medicine | Admitting: Family Medicine

## 2020-07-11 ENCOUNTER — Other Ambulatory Visit: Payer: Self-pay

## 2020-07-11 DIAGNOSIS — R928 Other abnormal and inconclusive findings on diagnostic imaging of breast: Secondary | ICD-10-CM

## 2020-07-25 ENCOUNTER — Ambulatory Visit: Payer: Medicare HMO

## 2020-08-11 NOTE — Progress Notes (Signed)
Subjective:   Patient ID: [name]    DOB: 06-26-1951, 69 y.o. female   MRN: [id number]  [name] is a 69 y.o. female with a history of HTN, allergic asthma, allergic rhinitis, OSA, GERD, HLD, T2DM, mosterate osteopenia, CKD III, h/o Breast cancer, LE edema, morbid obesity, chronic SOB 2/2 deconditioning, thalassemia, Vit D deficiency  here for follow up  Diabetes: Last three A1C's below. Currently on Farxiga 10mg  QD, Metformin 500mg  ER. Endorses compliance. Denies any polyuria, polydipsia, polyphagia. Takes the Metformin 3x/week. Lost 18lbs since last visit. Has cut down on sodas and portion size.   Lab Results  Component Value Date   HGBA1C 9.4 (A) 08/12/2020   HGBA1C 8.2 (A) 02/20/2020   HGBA1C 7.1 (H) 04/08/2011    HTN:  BP: (!) 145/90 today. Currently on Losartan -HCTZ and Doxazosin 3mg  qHS. Endorses compliance. Non-smoker. Denies any chest pain, SOB, vision changes, or headaches. CKD IIIb.   Health Maintenance: Health Maintenance Due  Topic  . TETANUS/TDAP    Review of Systems:  Per HPI.   Objective:   BP (!) 145/90   Pulse (!) 103   Ht 5\' 2"  (1.575 m)   Wt 247 lb 12.8 oz (112.4 kg)   SpO2 93%   BMI 45.32 kg/m  Vitals and nursing note reviewed.  General: pleasant older woman, sitting comfortably in exam chair, well nourished, well developed, in no acute distress with non-toxic appearance CV: regular rate and rhythm without murmurs, rubs, or gallops Lungs: clear to auscultation bilaterally with normal work of breathing on room air MSK:  gait normal Neuro: Alert and oriented, speech normal  Assessment & Plan:   Type 2 diabetes mellitus without complication, without long-term current use of insulin (HCC) Chronic, worsening. Has lost 18lbs since last visit. Not compliant with metformin, but has endorsed compliance with [place].  - discussed with patient importance of compliance to Metformin. She understood and was amendable to improvement - Continue  Farxiga 10mg  QD - Start Trulicity 0.75mg  q weekly - follow up in 3 months   Essential hypertension Chronic, uncontrolled. Currently on Losartan-HCTZ 100-25mg  and Doxazosin but notes dry mouth with Doxazosin. She would like to discontinue this. - continue Losartan-HCTZ - Stop Doxazosin  - Start Carvedilol 6.25mg  BID  - follow up in 2 weeks for BP check - if still elevated can consider increase to 12.5mg  mg BID up to 25mg  BID (if HR will tolerate)  - h/o nocturnal bradycardia per chart review in setting of OSA - Can consider low dose Doxazosin if still uncontrolled - takes lasix as needed for LE swelling which is helpful  Chronic kidney disease, stage III (moderate) Cr 1.50 with GFR of 40 at last visit. Likely in setting of chronic diabetes and HTN. currently on thiazide and loop diuretic + ARB for kidney protection, edema, and HTN Patient still has not established care with nephrologist.  Encouarged patient to call [facility] back to establish care at earliest convenience She voiced understanding and agreement with plan.  Health Maintenance: TDAP due - rx sent to pharmacy Flu vaccine given today  Orders Placed This Encounter  Procedures  . Flu Vaccine QUAD High Dose(Fluad)  . POCT glycosylated hemoglobin (Hb A1C)   Meds ordered this encounter  Medications  . Tdap (BOOSTRIX) 5-2.5-18.5 LF-MCG/0.5 injection    Sig: Inject 0.5 mLs into the muscle once for 1 dose.    Dispense:  0.5 mL    Refill:  0  . carvedilol (COREG) 6.25 MG  tablet    Sig: Take 1 tablet (6.25 mg total) by mouth 2 (two) times daily with a meal.    Dispense:  60 tablet    Refill:  0  . Dulaglutide (TRULICITY) 3.66 YQ/0.3KV SOPN    Sig: Inject 0.75 mg into the skin once a week.    Dispense:  2 mL    Refill:  2   Andrea Marble, DO PGY-3, [facility] 08/12/2020 12:47 PM

## 2020-08-12 ENCOUNTER — Ambulatory Visit (INDEPENDENT_AMBULATORY_CARE_PROVIDER_SITE_OTHER): Payer: Medicare HMO | Admitting: Family Medicine

## 2020-08-12 ENCOUNTER — Other Ambulatory Visit: Payer: Self-pay

## 2020-08-12 VITALS — BP 145/90 | HR 103 | Ht 62.0 in | Wt 247.8 lb

## 2020-08-12 DIAGNOSIS — N1831 Chronic kidney disease, stage 3a: Secondary | ICD-10-CM | POA: Diagnosis not present

## 2020-08-12 DIAGNOSIS — Z23 Encounter for immunization: Secondary | ICD-10-CM

## 2020-08-12 DIAGNOSIS — E119 Type 2 diabetes mellitus without complications: Secondary | ICD-10-CM

## 2020-08-12 DIAGNOSIS — I10 Essential (primary) hypertension: Secondary | ICD-10-CM | POA: Diagnosis not present

## 2020-08-12 LAB — POCT GLYCOSYLATED HEMOGLOBIN (HGB A1C): Hemoglobin A1C: 9.4 % — AB (ref 4.0–5.6)

## 2020-08-12 MED ORDER — TETANUS-DIPHTH-ACELL PERTUSSIS 5-2.5-18.5 LF-MCG/0.5 IM SUSP: 0.5000 mL | Freq: Once | INTRAMUSCULAR | 0 refills | Status: AC

## 2020-08-12 MED ORDER — CARVEDILOL 6.25 MG PO TABS: 6.2500 mg | ORAL_TABLET | Freq: Two times a day (BID) | ORAL | 0 refills | Status: DC

## 2020-08-12 MED ORDER — TRULICITY 0.75 MG/0.5ML ~~LOC~~ SOAJ: 0.7500 mg | SUBCUTANEOUS | 2 refills | Status: DC

## 2020-08-12 NOTE — Assessment & Plan Note (Signed)
Chronic, uncontrolled. Currently on Losartan-HCTZ 100-25mg  and Doxazosin but notes dry mouth with Doxazosin. She would like to discontinue this. - continue Losartan-HCTZ - Stop Doxazosin  - Start Carvedilol 6.25mg  BID  - follow up in 2 weeks for BP check - if still elevated can consider increase to 12.5mg  mg BID up to 25mg  BID (if HR will tolerate)  - h/o nocturnal bradycardia per chart review in setting of OSA - Can consider low dose Doxazosin if still uncontrolled - takes lasix as needed for LE swelling which is helpful

## 2020-08-12 NOTE — Assessment & Plan Note (Addendum)
Cr 1.50 with GFR of 40 at last visit. Likely in setting of chronic diabetes and HTN. currently on thiazide and loop diuretic + ARB for kidney protection, edema, and HTN Patient still has not established care with nephrologist.  Encouarged patient to call [facility] back to establish care at earliest convenience She voiced understanding and agreement with plan.

## 2020-08-12 NOTE — Assessment & Plan Note (Signed)
Chronic, worsening. Has lost 18lbs since last visit. Not compliant with metformin, but has endorsed compliance with [place].  - discussed with patient importance of compliance to Metformin. She understood and was amendable to improvement - Continue Farxiga 10mg  QD - Start Trulicity 0.75mg  q weekly - follow up in 3 months

## 2020-08-12 NOTE — Patient Instructions (Addendum)
It was a pleasure to see you today!  Thank you for choosing [facility] for your primary care.  [name] was seen for diabetes follow up   Our plans for today were:  Stop the Doxazosin  Start the Carvedilol 6.25mg  twice a day  Follow up in 2 weeks for repeat blood pressure  Please be sure to schedule appointment with the nephrologist  [facility] [phone]  To keep you healthy, please keep in mind the following health maintenance items that you are due for:   1. TDAP - get this from your local pharmacy 2.  Flu given today   You should return to our clinic in 3 months for diabetes follow up.   Best Wishes,   Mina Marble, DO

## 2020-08-26 ENCOUNTER — Ambulatory Visit: Payer: Medicare HMO

## 2020-09-10 ENCOUNTER — Other Ambulatory Visit: Payer: Self-pay | Admitting: Family Medicine

## 2020-09-10 DIAGNOSIS — I10 Essential (primary) hypertension: Secondary | ICD-10-CM

## 2020-09-10 NOTE — Telephone Encounter (Signed)
Will provide refill but patient needs to schedule nurse visit for blood pressure recheck prior to further refills thank you.

## 2020-09-30 ENCOUNTER — Ambulatory Visit: Payer: Medicare HMO | Admitting: Family Medicine

## 2020-10-03 ENCOUNTER — Other Ambulatory Visit: Payer: Self-pay | Admitting: Family Medicine

## 2020-10-03 DIAGNOSIS — I10 Essential (primary) hypertension: Secondary | ICD-10-CM

## 2020-10-03 NOTE — Telephone Encounter (Signed)
Please have patient schedule nurses visit for blood pressure check. Will only provide 2 week supply of BP meds at this time.

## 2020-10-07 ENCOUNTER — Ambulatory Visit: Payer: Medicare HMO | Admitting: Family Medicine

## 2020-10-10 ENCOUNTER — Ambulatory Visit: Payer: Medicare HMO | Admitting: Family Medicine

## 2020-11-12 DIAGNOSIS — E1122 Type 2 diabetes mellitus with diabetic chronic kidney disease: Secondary | ICD-10-CM | POA: Diagnosis not present

## 2020-11-12 DIAGNOSIS — D509 Iron deficiency anemia, unspecified: Secondary | ICD-10-CM | POA: Diagnosis not present

## 2020-11-12 DIAGNOSIS — I129 Hypertensive chronic kidney disease with stage 1 through stage 4 chronic kidney disease, or unspecified chronic kidney disease: Secondary | ICD-10-CM | POA: Diagnosis not present

## 2020-11-12 DIAGNOSIS — N183 Chronic kidney disease, stage 3 unspecified: Secondary | ICD-10-CM | POA: Diagnosis not present

## 2020-11-17 NOTE — Progress Notes (Signed)
Subjective:   Patient ID: [name]    DOB: 03-09-1951, 70 y.o. female   MRN: [id number]  [name] is a 70 y.o. female with a history of HTN, allergic asthma, allergic rhinitis OSA, GERD, HLD, T2DM, moderate osteopenia, CKDIII, h/o breast cancer, LE edema morbid obesity, chornic SOB 2/2 deconditioining, thalassemia, Vit D deficiency here for chronic disease management  Diabetes: Last three A1C's below. Currently on Metformin 500mg  qHS. [place] and Trulicity initially not covered by insurance thus discontinued. Has recently been started on Jardiance 10mg  QD by nephrology. Has tolerated this well. Open to restarting Trulicity. Denies any polyuria, polydipsia, polyphagia. Due for diabetic eye exam. Weight loss: 0lbs since November 2021.  Lab Results  Component Value Date   HGBA1C 8.8 (A) 11/18/2020   HGBA1C 9.4 (A) 08/12/2020   HGBA1C 8.2 (A) 02/20/2020    HTN:  BP: (!) 168/120 today. Repeat by Dr. Tarry Kos: 169/102. Currently on [place]-HCTZ 100-25mg  QD and Coreg 6.25mg  BID. Endorses compliance. Non-smoker. Denies any chest pain, SOB, vision changes, or headaches.   Lab Results  Component Value Date   CREATININE 1.50 (H) 03/26/2020   CREATININE 1.56 (H) 02/20/2020   CREATININE 1.2 (H) 04/12/2017   HLD: Last lipid panel below. Currently on Crestor 20mg  QD. Endorses compliance. Denies any muscles aches or weakness. The 10-year ASCVD risk score Mikey Bussing DC Jr., et al., 2013) is: 30.3%   Lab Results  Component Value Date   CHOL 135 02/20/2020   HDL 46 02/20/2020   LDLCALC 73 02/20/2020   TRIG 80 02/20/2020   CHOLHDL 2.9 02/20/2020   Health Maintenance: Health Maintenance Due  Topic  . TETANUS/TDAP   . OPHTHALMOLOGY EXAM    Review of Systems:  Per HPI.   Objective:   BP (!) 168/120   Pulse 100   Wt 247 lb 9.6 oz (112.3 kg)   SpO2 97%   BMI 45.29 kg/m  Vitals and nursing note reviewed.  General: pleasant older woman, sitting comfortably in exam chair, well  nourished, well developed, in no acute distress with non-toxic appearance Resp: breathing comfortably on room air, speaking in full sentences Skin: warm, dry MSK:  gait normal Neuro: Alert and oriented, speech normal  Assessment & Plan:   Essential hypertension Chronic, uncontrolled.  - continue Losartan-HCTZ 100-25mg  QD - Increase Coreg 12.5mg  BID - monitor blood pressure at home and keep a log to bring at follow-up visit -Follow-up 2 weeks for blood pressure check -BMP today to monitor kidney function, established with nephrology  Type 2 diabetes mellitus without complication, without long-term current use of insulin (HCC) Chronic, improving. -Continue Metformin 500 mg nightly -Continue Jardiance 10 mg daily -Restart Trulicity 1.94 mg q. Weekly -BMP today to monitor kidney function -Follow-up 3 months for A1c check -Due for diabetic eye exam.  Will discuss at follow-up visit.  Chronic kidney disease, stage III (moderate) Chronic, established with nephrology. -Will await records -BMP today to monitor  Hyperlipidemia associated with type 2 diabetes mellitus (HCC) Chronic. -Lipid panel to monitor -Continue Crestor 20 mg daily  Orders Placed This Encounter  Procedures  . Basic Metabolic Panel  . Lipid Panel  . POCT glycosylated hemoglobin (Hb A1C)   Meds ordered this encounter  Medications  . Dulaglutide (TRULICITY) 1.74 YC/1.4GY SOPN    Sig: Inject 0.75 mg into the skin once a week.    Dispense:  2 mL    Refill:  2  . Insulin Pen Needle (B-D UF III MINI PEN NEEDLES) 31G  X 5 MM MISC    Sig: 1 application by Does not apply route daily.    Dispense:  100 each    Refill:  0  . carvedilol (COREG) 12.5 MG tablet    Sig: Take 1 tablet (12.5 mg total) by mouth 2 (two) times daily with a meal.    Dispense:  60 tablet    Refill:  0  . metFORMIN (GLUCOPHAGE-XR) 500 MG 24 hr tablet    Sig: Take 1 tablet (500 mg total) by mouth at bedtime.    Dispense:  90 tablet     Refill:  2  . losartan-hydrochlorothiazide (HYZAAR) 100-25 MG tablet    Sig: Take 1 tablet by mouth daily.    Dispense:  90 tablet    Refill:  2  . furosemide (LASIX) 40 MG tablet    Sig: Take 1 tablet in the morning as needed for swelling.    Dispense:  30 tablet    Refill:  2  . rosuvastatin (CRESTOR) 20 MG tablet    Sig: Take 1 tablet (20 mg total) by mouth daily.    Dispense:  90 tablet    Refill:  [place], DO PGY-3, [facility] 11/18/2020 4:53 PM

## 2020-11-18 ENCOUNTER — Ambulatory Visit (INDEPENDENT_AMBULATORY_CARE_PROVIDER_SITE_OTHER): Payer: Medicare Other | Admitting: Family Medicine

## 2020-11-18 ENCOUNTER — Other Ambulatory Visit: Payer: Self-pay

## 2020-11-18 ENCOUNTER — Encounter: Payer: Self-pay | Admitting: Family Medicine

## 2020-11-18 VITALS — BP 168/120 | HR 100 | Wt 247.6 lb

## 2020-11-18 DIAGNOSIS — E785 Hyperlipidemia, unspecified: Secondary | ICD-10-CM

## 2020-11-18 DIAGNOSIS — N1831 Chronic kidney disease, stage 3a: Secondary | ICD-10-CM

## 2020-11-18 DIAGNOSIS — E1169 Type 2 diabetes mellitus with other specified complication: Secondary | ICD-10-CM

## 2020-11-18 DIAGNOSIS — E119 Type 2 diabetes mellitus without complications: Secondary | ICD-10-CM | POA: Diagnosis not present

## 2020-11-18 DIAGNOSIS — R609 Edema, unspecified: Secondary | ICD-10-CM | POA: Diagnosis not present

## 2020-11-18 DIAGNOSIS — I10 Essential (primary) hypertension: Secondary | ICD-10-CM | POA: Diagnosis not present

## 2020-11-18 LAB — POCT GLYCOSYLATED HEMOGLOBIN (HGB A1C): Hemoglobin A1C: 8.8 % — AB (ref 4.0–5.6)

## 2020-11-18 MED ORDER — METFORMIN HCL ER 500 MG PO TB24: 500.0000 mg | ORAL_TABLET | Freq: Every day | ORAL | 2 refills | Status: DC

## 2020-11-18 MED ORDER — LOSARTAN POTASSIUM-HCTZ 100-25 MG PO TABS: 1.0000 | ORAL_TABLET | Freq: Every day | ORAL | 2 refills | Status: DC

## 2020-11-18 MED ORDER — ROSUVASTATIN CALCIUM 20 MG PO TABS: 20.0000 mg | ORAL_TABLET | Freq: Every day | ORAL | 3 refills | Status: DC

## 2020-11-18 MED ORDER — FUROSEMIDE 40 MG PO TABS: ORAL_TABLET | ORAL | 2 refills | Status: DC

## 2020-11-18 MED ORDER — TRULICITY 0.75 MG/0.5ML ~~LOC~~ SOAJ: 0.7500 mg | SUBCUTANEOUS | 2 refills | Status: DC

## 2020-11-18 MED ORDER — BD PEN NEEDLE MINI U/F 31G X 5 MM MISC: 1.0000 "application " | Freq: Every day | 0 refills | Status: DC

## 2020-11-18 MED ORDER — CARVEDILOL 12.5 MG PO TABS: 12.5000 mg | ORAL_TABLET | Freq: Two times a day (BID) | ORAL | 0 refills | Status: DC

## 2020-11-18 NOTE — Assessment & Plan Note (Signed)
Chronic. -Lipid panel to monitor -Continue Crestor 20 mg daily

## 2020-11-18 NOTE — Assessment & Plan Note (Addendum)
Chronic, uncontrolled.  - continue Losartan-HCTZ 100-25mg  QD - Increase Coreg 12.5mg  BID - monitor blood pressure at home and keep a log to bring at follow-up visit -Follow-up 2 weeks for blood pressure check -BMP today to monitor kidney function, established with nephrology

## 2020-11-18 NOTE — Assessment & Plan Note (Signed)
Chronic, established with nephrology. -Will await records -BMP today to monitor

## 2020-11-18 NOTE — Assessment & Plan Note (Signed)
Chronic, improving. -Continue Metformin 500 mg nightly -Continue Jardiance 10 mg daily -Restart Trulicity 3.07 mg q. Weekly -BMP today to monitor kidney function -Follow-up 3 months for A1c check -Due for diabetic eye exam.  Will discuss at follow-up visit.

## 2020-11-18 NOTE — Patient Instructions (Signed)
It was a pleasure to see you today!  Thank you for choosing [facility] for your primary care.   Our plans for today were:  Diabetes:   Continue Metformin 500mg  daily  Continue the Jardiance10mg  daily  Start Trucility 0.75mg  weekly  Blood pressrue:  Continue the Losartan-HCTZ 100-25mg  daily  Increase the Carvedilol to 12.5mg  twice a day  Be sure to check your blood pressure daily and keep a log  Cholesterol  Continue the Rosuvastatin 20mg  daily  To keep you healthy, please keep in mind the following health maintenance items that you are due for:   1. TDAP  2.  Diabetic eye exam   You should return to our clinic in 3 months for diabetes and blood pressure check.   Best Wishes,   Mina Marble, DO

## 2020-11-19 LAB — BASIC METABOLIC PANEL
BUN/Creatinine Ratio: 13 (ref 12–28)
BUN: 22 mg/dL (ref 8–27)
CO2: 26 mmol/L (ref 20–29)
Calcium: 10 mg/dL (ref 8.7–10.3)
Chloride: 101 mmol/L (ref 96–106)
Creatinine, Ser: 1.68 mg/dL — ABNORMAL HIGH (ref 0.57–1.00)
GFR calc Af Amer: 35 mL/min/{1.73_m2} — ABNORMAL LOW (ref >59)
GFR calc non Af Amer: 30 mL/min/{1.73_m2} — ABNORMAL LOW (ref >59)
Glucose: 96 mg/dL (ref 65–99)
Potassium: 4.1 mmol/L (ref 3.5–5.2)
Sodium: 144 mmol/L (ref 134–144)

## 2020-11-19 LAB — LIPID PANEL
Chol/HDL Ratio: 4.2 ratio (ref 0.0–4.4)
Cholesterol, Total: 218 mg/dL — ABNORMAL HIGH (ref 100–199)
HDL: 52 mg/dL (ref >39)
LDL Chol Calc (NIH): 149 mg/dL — ABNORMAL HIGH (ref 0–99)
Triglycerides: 97 mg/dL (ref 0–149)
VLDL Cholesterol Cal: 17 mg/dL (ref 5–40)

## 2020-11-20 ENCOUNTER — Other Ambulatory Visit: Payer: Self-pay | Admitting: Nephrology

## 2020-11-20 DIAGNOSIS — N183 Chronic kidney disease, stage 3 unspecified: Secondary | ICD-10-CM

## 2020-12-02 ENCOUNTER — Other Ambulatory Visit: Payer: Medicare Other

## 2020-12-03 ENCOUNTER — Other Ambulatory Visit: Payer: Self-pay

## 2020-12-03 ENCOUNTER — Ambulatory Visit: Payer: Medicare Other

## 2020-12-03 DIAGNOSIS — I10 Essential (primary) hypertension: Secondary | ICD-10-CM

## 2020-12-03 NOTE — Progress Notes (Signed)
Patient here today for BP check.      Last BP was on 169/102.  BP today is 126/82.    Repeat after sitting for ~15 minutes 122/82.  Checked BP in left arm with large cuff.    Symptoms present: none.   Patient last took BP meds Losartan-HCTZ and (1) Carvedilol this morning. Patient reports she takes her (2) Carvedilol in the evening.   Routed note to PCP.

## 2020-12-05 DIAGNOSIS — M1712 Unilateral primary osteoarthritis, left knee: Secondary | ICD-10-CM | POA: Diagnosis not present

## 2020-12-05 DIAGNOSIS — M25562 Pain in left knee: Secondary | ICD-10-CM | POA: Diagnosis not present

## 2020-12-06 NOTE — Progress Notes (Signed)
Blood pressure improved. No changes to management at this time.

## 2020-12-09 ENCOUNTER — Ambulatory Visit
Admission: RE | Admit: 2020-12-09 | Discharge: 2020-12-09 | Disposition: A | Discharge status: Home / Self Care | Payer: Medicare Other | Source: Ambulatory Visit | Attending: Nephrology | Admitting: Nephrology

## 2020-12-09 DIAGNOSIS — N183 Chronic kidney disease, stage 3 unspecified: Secondary | ICD-10-CM | POA: Diagnosis not present

## 2020-12-10 ENCOUNTER — Other Ambulatory Visit: Payer: Self-pay

## 2020-12-10 ENCOUNTER — Encounter (INDEPENDENT_AMBULATORY_CARE_PROVIDER_SITE_OTHER): Payer: Self-pay | Admitting: Family Medicine

## 2020-12-10 ENCOUNTER — Ambulatory Visit (INDEPENDENT_AMBULATORY_CARE_PROVIDER_SITE_OTHER): Payer: Medicare Other | Admitting: Family Medicine

## 2020-12-10 VITALS — BP 143/91 | HR 77 | Temp 98.2°F | Ht 61.0 in | Wt 246.0 lb

## 2020-12-10 DIAGNOSIS — R5383 Other fatigue: Secondary | ICD-10-CM | POA: Diagnosis not present

## 2020-12-10 DIAGNOSIS — R0602 Shortness of breath: Secondary | ICD-10-CM | POA: Diagnosis not present

## 2020-12-10 DIAGNOSIS — Z0289 Encounter for other administrative examinations: Secondary | ICD-10-CM

## 2020-12-10 DIAGNOSIS — E538 Deficiency of other specified B group vitamins: Secondary | ICD-10-CM

## 2020-12-10 DIAGNOSIS — Z1331 Encounter for screening for depression: Secondary | ICD-10-CM

## 2020-12-10 DIAGNOSIS — Z6841 Body Mass Index (BMI) 40.0 and over, adult: Secondary | ICD-10-CM | POA: Diagnosis not present

## 2020-12-10 DIAGNOSIS — Z9189 Other specified personal risk factors, not elsewhere classified: Secondary | ICD-10-CM

## 2020-12-10 DIAGNOSIS — E1169 Type 2 diabetes mellitus with other specified complication: Secondary | ICD-10-CM

## 2020-12-10 DIAGNOSIS — D509 Iron deficiency anemia, unspecified: Secondary | ICD-10-CM

## 2020-12-10 DIAGNOSIS — E559 Vitamin D deficiency, unspecified: Secondary | ICD-10-CM

## 2020-12-11 DIAGNOSIS — M25562 Pain in left knee: Secondary | ICD-10-CM | POA: Diagnosis not present

## 2020-12-11 DIAGNOSIS — M1712 Unilateral primary osteoarthritis, left knee: Secondary | ICD-10-CM | POA: Diagnosis not present

## 2020-12-11 LAB — INSULIN, RANDOM: INSULIN: 20.1 u[IU]/mL (ref 2.6–24.9)

## 2020-12-11 LAB — COMPREHENSIVE METABOLIC PANEL
ALT: 17 IU/L (ref 0–32)
AST: 13 IU/L (ref 0–40)
Albumin/Globulin Ratio: 1.3 (ref 1.2–2.2)
Albumin: 3.8 g/dL (ref 3.7–4.7)
Alkaline Phosphatase: 63 IU/L (ref 44–121)
BUN/Creatinine Ratio: 17 (ref 12–28)
BUN: 25 mg/dL (ref 8–27)
Bilirubin Total: 0.2 mg/dL (ref 0.0–1.2)
CO2: 23 mmol/L (ref 20–29)
Calcium: 9.3 mg/dL (ref 8.7–10.3)
Chloride: 103 mmol/L (ref 96–106)
Creatinine, Ser: 1.47 mg/dL — ABNORMAL HIGH (ref 0.57–1.00)
Globulin, Total: 2.9 g/dL (ref 1.5–4.5)
Glucose: 118 mg/dL — ABNORMAL HIGH (ref 65–99)
Potassium: 4.3 mmol/L (ref 3.5–5.2)
Sodium: 141 mmol/L (ref 134–144)
Total Protein: 6.7 g/dL (ref 6.0–8.5)
eGFR: 38 mL/min/{1.73_m2} — ABNORMAL LOW (ref >59)

## 2020-12-11 LAB — VITAMIN B12: Vitamin B-12: 1064 pg/mL (ref 232–1245)

## 2020-12-11 LAB — CBC WITH DIFFERENTIAL/PLATELET
Basophils Absolute: 0 10*3/uL (ref 0.0–0.2)
Basos: 1 %
EOS (ABSOLUTE): 0.1 10*3/uL (ref 0.0–0.4)
Eos: 2 %
Hematocrit: 40.9 % (ref 34.0–46.6)
Hemoglobin: 12.6 g/dL (ref 11.1–15.9)
Immature Grans (Abs): 0 10*3/uL (ref 0.0–0.1)
Immature Granulocytes: 0 %
Lymphocytes Absolute: 1.4 10*3/uL (ref 0.7–3.1)
Lymphs: 22 %
MCH: 23.4 pg — ABNORMAL LOW (ref 26.6–33.0)
MCHC: 30.8 g/dL — ABNORMAL LOW (ref 31.5–35.7)
MCV: 76 fL — ABNORMAL LOW (ref 79–97)
Monocytes Absolute: 0.5 10*3/uL (ref 0.1–0.9)
Monocytes: 8 %
Neutrophils Absolute: 4.2 10*3/uL (ref 1.4–7.0)
Neutrophils: 67 %
Platelets: 232 10*3/uL (ref 150–450)
RBC: 5.38 x10E6/uL — ABNORMAL HIGH (ref 3.77–5.28)
RDW: 17.5 % — ABNORMAL HIGH (ref 11.7–15.4)
WBC: 6.2 10*3/uL (ref 3.4–10.8)

## 2020-12-11 LAB — TSH: TSH: 1.83 u[IU]/mL (ref 0.450–4.500)

## 2020-12-11 LAB — T4: T4, Total: 6.5 ug/dL (ref 4.5–12.0)

## 2020-12-11 LAB — VITAMIN D 25 HYDROXY (VIT D DEFICIENCY, FRACTURES): Vit D, 25-Hydroxy: 30.2 ng/mL (ref 30.0–100.0)

## 2020-12-11 LAB — T3: T3, Total: 67 ng/dL — ABNORMAL LOW (ref 71–180)

## 2020-12-12 ENCOUNTER — Other Ambulatory Visit: Payer: Self-pay | Admitting: Family Medicine

## 2020-12-12 ENCOUNTER — Other Ambulatory Visit: Payer: Medicare Other

## 2020-12-12 DIAGNOSIS — I10 Essential (primary) hypertension: Secondary | ICD-10-CM

## 2020-12-12 MED ORDER — CARVEDILOL 12.5 MG PO TABS: 12.5000 mg | ORAL_TABLET | Freq: Two times a day (BID) | ORAL | 1 refills | Status: DC

## 2020-12-16 NOTE — Progress Notes (Signed)
Office: [phone]  /  Fax: [phone]    Date: December 30, 2020   Appointment Start Time: 11:03am Duration: 31 minutes Provider: Glennie Isle, Psy.D. Type of Session: Intake for Individual Therapy  Location of Patient: Home Location of Provider: Provider's Home (private office) Type of Contact: Telepsychological Visit via MyChart Video Visit & Telephone call   Informed Consent: This provider called [name] at 11:03am as she presented early for the appointment, but there was an unstable connection. Due to ongoing technical issues on her end, Quintasia provided verbal consent to proceed via telephone for today's appointment. Prior to proceeding with today's appointment, two pieces of identifying information were obtained. In addition, Tsering's physical location at the time of this appointment was obtained as well a phone number she could be reached at in the event of technical difficulties. Valaree and this provider participated in today's telepsychological service.   The provider's role was explained to Hilaria Ota. The provider reviewed and discussed issues of confidentiality, privacy, and limits therein (e.g., reporting obligations). In addition to verbal informed consent, written informed consent for psychological services was obtained prior to the initial appointment. Since the clinic is not a 24/7 crisis center, mental health emergency resources were shared and this  provider explained MyChart, e-mail, voicemail, and/or other messaging systems should be utilized only for non-emergency reasons. This provider also explained that information obtained during appointments will be placed in [facility] record and relevant information will be shared with other providers at [facility] for coordination of care. Beulah agreed information may be shared with other [facility] providers as needed for coordination of care and by signing the service agreement document, she provided  written consent for coordination of care. Prior to initiating telepsychological services, Janee completed an informed consent document, which included the development of a safety plan (i.e., an emergency contact and emergency resources) in the event of an emergency/crisis. Kaylan expressed understanding of the rationale of the safety plan. Erabella verbally acknowledged understanding she is ultimately responsible for understanding her insurance benefits for telepsychological and in-person services. This provider also reviewed confidentiality, as it relates to telepsychological services, as well as the rationale for telepsychological services (i.e., to reduce exposure risk to COVID-19). Orville  acknowledged understanding that appointments cannot be recorded without both party consent and she is aware she is responsible for securing confidentiality on her end of the session. Liese verbally consented to proceed.  Chief Complaint/HPI: Chasitee was referred by Dr. Dennard Nip during their initial appointment on December 10, 2020. Elianis's Food and Mood (modified PHQ-9) score on December 10, 2020 was 8.  During today's appointment, Clemmie was verbally administered a questionnaire assessing various behaviors related to emotional eating. Elasia endorsed the following: eat certain foods when you are anxious, stressed, depressed, or your feelings are hurt, use food to help you cope with emotional situations and find food is comforting to you. She shared she craves sweets, noting she likes pizza. Keayra believes planning/preparing foods ahead of time helps her make better choices. In addition, Zilla denied a history of binge eating. Aleea denied a history of restricting food intake, purging and engagement in other compensatory strategies, and has never been diagnosed with an eating disorder. She also denied a history of treatment for emotional eating. Currently, Khalila indicated she engages in emotional eating "a couple times a month," noting she  "never thought about it before." Furthermore, Duru shared she was unable to follow the plan for a bit initially  due to a death in the family.   Mental Status Examination:  Appearance: unable to assess  Behavior: appropriate to circumstances Mood: euthymic Affect: unable to fully assess Speech: normal in rate, volume, and tone Eye Contact: unable to assess Psychomotor Activity: unable to assess  Gait: unable to assess Thought Process: linear, logical, and goal directed  Thought Content/Perception: denies suicidal and homicidal ideation, plan, and intent and no hallucinations, delusions, bizarre thinking or behavior reported or observed Orientation: time, person, place, and purpose of appointment Memory/Concentration: memory, attention, language, and fund of knowledge intact  Insight/Judgment: fair  Family & Psychosocial History: Annalena reported she is married and she has an adult son. She indicated she is currently retired, adding she works two days a week at the [employer]. 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 Currently, Sekai's social support system consists of her husband and niece. Moreover, Jleigh stated she resides with her husband, noting he tries to help her with the meal plan.    Medical History:  Past Medical History:  Diagnosis Date  . Abnormal electrocardiogram (ECG) (EKG)   . Anemia   . Arthritis   . Asthma   . Back pain   . Bilateral swelling of feet   . Blood transfusion 03/14/12  . Bradycardia   . Breast cancer ([place]) 03/16/12   right lumpectomy=high grade ductal ca in situ,ER/PR=positive  . Cancer ([place])    right breast ductal carcinoma in situ  . Chronic kidney disease    stage III   . Diabetes mellitus    type 2  . Dyspnea   . Edema   . GERD (gastroesophageal reflux disease)   . Hiatal hernia    large  . Hypercholesterolemia   . Hypertension    Echocardiogram 03/2011: EF 55-60%, grade 1  diastolic dysfunction, mild MAC, mild LAE  . Iron deficiency anemia   . Joint pain   . Morbid obesity ([place])   . Other fatigue   . S/P radiation therapy 05/16/12 - 06/29/12   Right Breast: 50 Gray/ 25 Fractions with Boost: 10 Gray/ 5 Fractions  . Shortness of breath    with exertion  . Shortness of breath on exertion   . Sinus drainage   . Sleep apnea   . Total knee replacement status 05/15/2014  . Use of anastrozole (Arimidex) Started 10/13  . Vitamin D deficiency    Past Surgical History:  Procedure Laterality Date  . Biospy Right Breast  02/01/12   Right Breast Needle Core Biopsy: ductal Carcinoma In situ with Papillary Features, ER/PR Positive  . BREAST EXCISIONAL BIOPSY     left 1998  . BREAST LUMPECTOMY  04/1997  . Lumpectomy Right Breast  03/16/12   Ductal carcinoma In situ: clear margins: 0/1 Node Negative  . TOTAL KNEE ARTHROPLASTY Right 05/15/2014   Procedure: RIGHT TOTAL KNEE ARTHROPLASTY;  Surgeon: Tobi Bastos, MD;  Location: [facility];  Service: Orthopedics;  Laterality: Right;   Current Outpatient Medications on File Prior to Visit  Medication Sig Dispense Refill  . albuterol (PROAIR HFA) 108 (90 Base) MCG/ACT inhaler Inhale 2 puffs into the lungs every 6 (six) hours as needed for wheezing or shortness of breath. 18 g 5  . carvedilol (COREG) 12.5 MG tablet Take 1 tablet (12.5 mg total) by mouth 2 (two) times daily with a meal. 180 tablet 1  . cholecalciferol (VITAMIN D) 1000 units tablet Take 2,000 Units by mouth daily.    [name]  Dulaglutide (TRULICITY) 1.02 HE/5.2DP SOPN Inject 0.75 mg into the skin once a week. 2 mL 2  . empagliflozin (JARDIANCE) 10 MG TABS tablet Take by mouth daily.    [name] Fexofenadine-Pseudoephedrine (ALLEGRA-D 24 HOUR PO) Take 1 tablet by mouth daily.    . fluticasone (FLONASE) 50 MCG/ACT nasal spray Place 2 sprays into both nostrils daily. 16 g 2  . fluticasone furoate-vilanterol (BREO ELLIPTA) 200-25 MCG/INH AEPB Inhale 1 puff into the lungs daily. 60  each 5  . furosemide (LASIX) 40 MG tablet Take 1 tablet in the morning as needed for swelling. 30 tablet 2  . losartan-hydrochlorothiazide (HYZAAR) 100-25 MG tablet Take 1 tablet by mouth daily. 90 tablet 2  . metFORMIN (GLUCOPHAGE-XR) 500 MG 24 hr tablet Take 1 tablet (500 mg total) by mouth at bedtime. 90 tablet 2  . rosuvastatin (CRESTOR) 20 MG tablet Take 1 tablet (20 mg total) by mouth daily. 90 tablet 3  . vitamin C (ASCORBIC ACID) 500 MG tablet Take 500 mg by mouth daily.    . Vitamin D, Ergocalciferol, (DRISDOL) 1.25 MG (50000 UNIT) CAPS capsule Take 1 capsule (50,000 Units total) by mouth every 7 (seven) days. 4 capsule 0   No current facility-administered medications on file prior to visit.   Mental Health History: Nickola reported she has never attended therapeutic services and denied a history of psychotropic medication. Jimmi reported there is no history of hospitalizations for psychiatric concerns. Darlis reported her niece attended therapeutic services for grief and her nephew has "some issues." Ninah reported there is no history of trauma including psychological, physical  and sexual abuse, as well as neglect.   Ayani described her typical mood lately as "upbeat," noting her husband may "wreck [her] nerves." She added, "I think I'm pretty mild manner and try to enjoy life." Aside from concerns noted above and endorsed on the PHQ-9, Azul reported experiencing occasional crying spells since her mother passed away in 03-18-2019. She also discussed decreased motivation since the onset of the pandemic. She discussed praying helps with coping. Yanessa denied current alcohol use. She denied tobacco use. She denied illicit/recreational substance use. Regarding caffeine intake, Alverta reported consuming tea daily. She reported she stopped drinking coffee since starting with the clinic as she would have to use snack calories for creamer. Furthermore, Janiaya indicated she is not experiencing the following:  hallucinations and delusions, paranoia, symptoms of mania , social withdrawal and panic attacks. She also denied history of and current suicidal ideation, plan, and intent; history of and current homicidal ideation, plan, and intent; and history of and current engagement in self-harm.  The following strengths were reported by [name]: passion for helping others and family oriented. The following strengths were observed by this provider: ability to express thoughts and feelings during the therapeutic session, ability to establish and benefit from a therapeutic relationship, willingness to work toward established goal(s) with the clinic and ability to engage in reciprocal conversation.   Legal History: Ruwayda reported there is no history of legal involvement.   Structured Assessments Results: The Patient Health Questionnaire-9 (PHQ-9) is a self-report measure that assesses symptoms and severity of depression over the course of the last two weeks. Ashling obtained a score of 1 suggesting minimal depression. Aliyyah finds the endorsed symptoms to be not difficult at all. [0= Not at all; 1= Several days; 2= More than half the days; 3= Nearly every day] Little interest or pleasure in doing things 0  Feeling down, depressed, or hopeless 0  Trouble falling or staying asleep, or sleeping too much 0  Feeling tired or having little energy 1  Poor appetite or overeating 0  Feeling bad about yourself --- or that you are a failure or have let yourself or your family down 0  Trouble concentrating on things, such as reading the newspaper or watching television 0  Moving or speaking so slowly that other people could have noticed? Or the opposite --- being so fidgety or restless that you have been moving around a lot more than usual 0  Thoughts that you would be better off dead or hurting yourself in some way 0  PHQ-9 Score 1    The Generalized Anxiety Disorder-7 (GAD-7) is a brief self-report measure that assesses symptoms  of anxiety over the course of the last two weeks. Rashauna obtained a score of 0. [0= Not at all; 1= Several days; 2= Over half the days; 3= Nearly every day] Feeling nervous, anxious, on edge 0  Not being able to stop or control worrying 0  Worrying too much about different things 0  Trouble relaxing 0  Being so restless that it's hard to sit still 0  Becoming easily annoyed or irritable 0  Feeling afraid as if something awful might happen 0  GAD-7 Score 0   Interventions:  Conducted a chart review Focused on rapport building Verbally administered PHQ-9 and GAD-7 for symptom monitoring Verbally administered Food & Mood questionnaire to assess various behaviors related to emotional eating Provided emphatic reflections and validation Collaborated with patient on a treatment goal  Psychoeducation provided regarding physical versus emotional hunger  Provisional DSM-5 Diagnosis(es): 307.59 (F50.8) Other Specified Feeding or Eating Disorder, Emotional Eating Behaviors  Plan: Catori appears able and willing to participate as evidenced by collaboration on a treatment goal, engagement in reciprocal conversation, and asking questions as needed for clarification. Per Reem's request due to current success with the structured meal plan and her work schedule, the next appointment will be scheduled in one month, which will be via Dent Visit. The following treatment goal was established: increase coping skills. This provider will regularly review the treatment plan and medical chart to keep informed of status changes. Raelan expressed understanding and agreement with the initial treatment plan of care. Gilda will be sent a handout via e-mail to utilize between now and the next appointment to increase awareness of hunger patterns and subsequent eating. Adrijana provided verbal consent during today's appointment for this provider to send the handout via e-mail.

## 2020-12-16 NOTE — Progress Notes (Signed)
Chief Complaint:   OBESITY [name] (MR# [id number]) is a 70 y.o. female who presents for evaluation Andrea treatment of obesity Andrea related comorbidities. Current BMI is Body mass index is 46.48 kg/m. [name] has been struggling with her weight for many years Andrea has been unsuccessful in either losing weight, maintaining weight [name], or reaching her healthy weight goal.  [name] is currently in the action stage of change Andrea ready to dedicate time achieving Andrea maintaining a healthier weight. [name] is interested in becoming our patient Andrea working on intensive lifestyle modifications including (but not limited to) [name] Andrea Andrea for weight [name].  [name]'s habits were reviewed today Andrea are as follows: Her family eats meals together, she thinks her family will eat healthier with her, she struggles with family Andrea or coworkers weight [name] sabotage, her desired weight [name] is 71 lbs, she started gaining weight after the birth of her son in 82, her heaviest weight ever was 265 pounds, she is a picky eater Andrea doesn't like to eat healthier foods, she has significant food cravings issues, she snacks frequently in the evenings, she is frequently drinking liquids with calories, she frequently makes poor food choices Andrea she struggles with emotional eating.  Depression Screen [name]'s Food Andrea Mood (modified PHQ-9) score was 8.  Depression screen PHQ 2/9 12/10/2020  Decreased Interest 1  Down, Depressed, Hopeless 0  PHQ - 2 Score 1  Altered sleeping 2  Tired, decreased energy 2  Change in appetite 1  Feeling bad or failure about yourself  0  Trouble concentrating 1  Moving slowly or fidgety/restless 1  Suicidal thoughts 0  PHQ-9 Score 8  Difficult doing work/chores Not difficult at all   Subjective:   1. Other fatigue [name] admits to daytime somnolence Andrea admits to waking up still tired. Patent has a history of symptoms of daytime fatigue. [name] generally gets 5 or 6 hours of sleep per  night, Andrea states that she has nightime awakenings. Snoring is present. Apneic episodes are present. Epworth Sleepiness Score is 8.  2. Shortness of breath on exertion [name] notes increasing shortness of breath with exercising Andrea seems to be worsening over time with weight gain. She notes getting out of breath sooner with activity than she used to. This has not gotten worse recently. [name] denies shortness of breath at rest or orthopnea.  3. Type 2 diabetes mellitus with other specified complication, without long-term current use of insulin (HCC) [name]'s recent fasting BGs range between 119 Andrea 152, Andrea 2 hour post prandial range from 161 to 237. Her A1c last month was 8.8, down from 9.4 in 07/2020.  4. Iron deficiency anemia, unspecified iron deficiency anemia type [name] has a history of anemia, Andrea she has no recent CBC in Epic. She notes fatigue.  5. Vitamin D deficiency [name] is on Vit D, Andrea she still notes fatigue. She has no recent labs in [name].  6. B12 nutritional deficiency [name] is on metformin which increases her risk of B12 deficiency. She notes fatigue.  7. At risk for heart [name] [name] is at a higher than average risk for cardiovascular [name] due to obesity.   Assessment/Plan:   1. Other fatigue [name] does feel that her weight is causing her energy to be lower than it should be. Fatigue may be related to obesity, depression or many other causes. Labs will be ordered, Andrea in the meanwhile, [name] will focus on self care including making healthy food choices,  increasing physical activity Andrea focusing on stress reduction.  - Comprehensive metabolic panel - EKG [id number] - T3 - T4 - TSH  2. Shortness of breath on exertion [name] does feel that she gets out of breath more easily that she used to when she exercises. [name]'s shortness of breath appears to be obesity related Andrea Andrea induced. She has agreed to work on weight [name] Andrea gradually increase Andrea to treat her Andrea  induced shortness of breath. Will continue to monitor closely.  3. Type 2 diabetes mellitus with other specified complication, without long-term current use of insulin (HCC) [name], [name], [name], [name], [name], [name], [name] weight [name] are the first line of treatment for diabetes. We will check labs today. [name] will start her Category 2 plan, Andrea will continue to follow up as directed.  - Insulin, random  4. Iron deficiency anemia, unspecified iron deficiency anemia type We will check labs today. [name] will continue to follow up as directed. Orders Andrea follow up as documented in patient record.  - CBC with Differential/Platelet  5. Vitamin D deficiency Low Vitamin D level contributes to fatigue Andrea are associated with obesity, breast, Andrea colon cancer. We will check labs today. [name] will follow-up for routine testing of Vitamin D, at least 2-3 times per year to avoid over-replacement.  - VITAMIN D 25 Hydroxy (Vit-D Deficiency, Fractures)  6. B12 nutritional deficiency The diagnosis was reviewed with the patient. We will check labs today. [name] will continue to follow up as directed. Orders Andrea follow up as documented in patient record.  - Vitamin B12  7. Screening for depression [name] had a positive depression screening. Depression is commonly associated with obesity Andrea often results in emotional eating behaviors. We will monitor this closely Andrea work on CBT to help improve the non-hunger eating patterns. Referral to Psychology may be required if no improvement is seen as she continues in our clinic.  8. At risk for heart [name] [name] was given approximately 30 minutes of [name] prevention counseling today. She is 70 y.o. female Andrea has risk factors for heart [name] including obesity.  We discussed intensive lifestyle modifications today with an emphasis on specific weight [name] instructions Andrea strategies.   Repetitive spaced learning was employed today to elicit superior memory formation Andrea behavioral change.  9. Class 3 severe obesity with serious comorbidity Andrea body mass index (BMI) of 45.0 to 49.9 in adult, unspecified obesity type [facility]) Eulala is currently in the action stage of change Andrea her goal is to continue with weight [name] efforts. I recommend Fallen begin the structured treatment plan as follows:  She has agreed to the Category 2 Plan + 100 calories.  Andrea goals: No Andrea has been prescribed for now, while we concentrate on nutritional changes.   Behavioral modification strategies: increasing lean protein intake Andrea meal planning Andrea cooking strategies.  She was informed of the importance of frequent follow-up visits to maximize her success with intensive lifestyle modifications for her multiple health conditions. She was informed we would discuss her lab results at her next visit unless there is a critical issue that needs to be addressed sooner. Raelle agreed to keep her next visit at the agreed upon time to discuss these results.  Objective:   Blood pressure (!) 143/91, pulse 77, temperature 98.2 F (36.8 C), height 5\' 1"  (1.549  m), weight 246 lb (111.6 kg), SpO2 95 %. Body mass index is 46.48 kg/m.  EKG: Normal sinus rhythm, rate 82 BPM.  Indirect Calorimeter completed today shows a VO2 of 263 Andrea a REE of 1829.  Her calculated basal metabolic rate is 4503 thus her basal metabolic rate is better than expected.  General: Cooperative, alert, well developed, in no acute distress. HEENT: Conjunctivae Andrea lids unremarkable. Cardiovascular: Regular rhythm.  Lungs: Normal work of breathing. Neurologic: No focal deficits.   Lab Results  Component Value Date   CREATININE 1.47 (H) 12/10/2020   BUN 25 12/10/2020   NA 141 12/10/2020   K 4.3 12/10/2020    CL 103 12/10/2020   CO2 23 12/10/2020   Lab Results  Component Value Date   ALT 17 12/10/2020   AST 13 12/10/2020   ALKPHOS 63 12/10/2020   BILITOT 0.2 12/10/2020   Lab Results  Component Value Date   HGBA1C 8.8 (A) 11/18/2020   HGBA1C 9.4 (A) 08/12/2020   HGBA1C 8.2 (A) 02/20/2020   HGBA1C 7.1 (H) 04/08/2011   Lab Results  Component Value Date   INSULIN 20.1 12/10/2020   Lab Results  Component Value Date   TSH 1.830 12/10/2020   Lab Results  Component Value Date   CHOL 218 (H) 11/18/2020   HDL 52 11/18/2020   LDLCALC 149 (H) 11/18/2020   TRIG 97 11/18/2020   CHOLHDL 4.2 11/18/2020   Lab Results  Component Value Date   WBC 6.2 12/10/2020   HGB 12.6 12/10/2020   HCT 40.9 12/10/2020   MCV 76 (L) 12/10/2020   PLT 232 12/10/2020   Lab Results  Component Value Date   IRON 30 (L) 02/15/2014   TIBC 398 02/15/2014   FERRITIN 10 02/08/2014   Attestation Statements:   Reviewed by clinician on day of visit: allergies, medications, problem list, medical history, surgical history, family history, social history, Andrea previous encounter notes.   I, Trixie Dredge, am acting as transcriptionist for Dennard Nip, MD.  I have reviewed the above documentation for accuracy Andrea completeness, Andrea I agree with the above. - Dennard Nip, MD

## 2020-12-19 DIAGNOSIS — M25562 Pain in left knee: Secondary | ICD-10-CM | POA: Diagnosis not present

## 2020-12-19 DIAGNOSIS — M1712 Unilateral primary osteoarthritis, left knee: Secondary | ICD-10-CM | POA: Diagnosis not present

## 2020-12-24 ENCOUNTER — Other Ambulatory Visit: Payer: Self-pay

## 2020-12-24 ENCOUNTER — Ambulatory Visit (INDEPENDENT_AMBULATORY_CARE_PROVIDER_SITE_OTHER): Payer: Medicare Other | Admitting: Family Medicine

## 2020-12-24 VITALS — BP 147/90 | HR 94 | Temp 98.3°F | Ht 61.0 in | Wt 239.0 lb

## 2020-12-24 DIAGNOSIS — Z6841 Body Mass Index (BMI) 40.0 and over, adult: Secondary | ICD-10-CM | POA: Diagnosis not present

## 2020-12-24 DIAGNOSIS — E559 Vitamin D deficiency, unspecified: Secondary | ICD-10-CM

## 2020-12-24 DIAGNOSIS — E1169 Type 2 diabetes mellitus with other specified complication: Secondary | ICD-10-CM | POA: Diagnosis not present

## 2020-12-24 MED ORDER — VITAMIN D (ERGOCALCIFEROL) 1.25 MG (50000 UNIT) PO CAPS: 50000.0000 [IU] | ORAL_CAPSULE | ORAL | 0 refills | Status: DC

## 2020-12-30 ENCOUNTER — Telehealth (INDEPENDENT_AMBULATORY_CARE_PROVIDER_SITE_OTHER): Payer: Medicare Other | Admitting: Psychology

## 2020-12-30 DIAGNOSIS — F5089 Other specified eating disorder: Secondary | ICD-10-CM | POA: Diagnosis not present

## 2020-12-30 NOTE — Progress Notes (Signed)
Chief Complaint:   OBESITY Anicka is here to discuss her progress with her obesity treatment plan along with follow-up of her obesity related diagnoses. Helen is on the Category 2 Plan + 100 calories and states she is following her eating plan approximately 50% of the time. Glenola states she is doing 0 minutes 0 times per week.  Today's visit was #: 2 Starting weight: 246 lbs Starting date: 12/10/2020 Today's weight: 239 lbs Today's date: 12/24/2020 Total lbs lost to date: 7 Total lbs lost since last in-office visit: 7  Interim History: Annaclaire has done very well with weight loss. She had to travel for a funeral the first week but she did well with getting back on track when she got back. She notes her hunger was controlled, but she gets a bit bored with her plan.  Subjective:   1. Vitamin D deficiency Irie's Vit D level is not yet at goal, even on 2,000 OTC Vit D. I discussed labs with the patient today.  2. Type 2 diabetes mellitus with other specified complication, without long-term current use of insulin ([place]) Julianne's last A1c is not yet at goal. She notes her fasting BGs have already started to improve, and she is down 20-30 points on an average in the last 2 weeks. She denies signs of hypoglycemia. I discussed labs with the patient today.  Assessment/Plan:   1. Vitamin D deficiency Low Vitamin D level contributes to fatigue and are associated with obesity, breast, and colon cancer. Legend agreed to continue taking OTC Vit D, and start prescription Vitamin D 50,000 IU every week with no refills. She will follow-up for routine testing of Vitamin D, at least 2-3 times per year to avoid over-replacement.  - Vitamin D, Ergocalciferol, (DRISDOL) 1.25 MG (50000 UNIT) CAPS capsule; Take 1 capsule (50,000 Units total) by mouth every 7 (seven) days.  Dispense: 4 capsule; Refill: 0  2. Type 2 diabetes mellitus with other specified complication, without long-term current use of insulin (HCC) Good  blood sugar control is important to decrease the likelihood of diabetic complications such as nephropathy, neuropathy, limb loss, blindness, coronary artery disease, and death. Intensive lifestyle modification including diet, exercise and weight loss are the first line of treatment for diabetes. Betti will continue diet and exercise, and we will recheck labs in 3 months.  3. Obesity with current BMI of 45.3 Lennie is currently in the action stage of change. As such, her goal is to continue with weight loss efforts. She has agreed to the Category 2 Plan and keeping a food journal and adhering to recommended goals of 250-350 calories and 20+ grams of protein at breakfast daily.   Behavioral modification strategies: increasing lean protein intake.  Denea has agreed to follow-up with our clinic in 2 weeks. She was informed of the importance of frequent follow-up visits to maximize her success with intensive lifestyle modifications for her multiple health conditions.   Objective:   Blood pressure (!) 147/90, pulse 94, temperature 98.3 F (36.8 C), height 5\' 1"  (1.549 m), weight 239 lb (108.4 kg), SpO2 97 %. Body mass index is 45.16 kg/m.  General: Cooperative, alert, well developed, in no acute distress. HEENT: Conjunctivae and lids unremarkable. Cardiovascular: Regular rhythm.  Lungs: Normal work of breathing. Neurologic: No focal deficits.   Lab Results  Component Value Date   CREATININE 1.47 (H) 12/10/2020   BUN 25 12/10/2020   NA 141 12/10/2020   K 4.3 12/10/2020   CL 103 12/10/2020  CO2 23 12/10/2020   Lab Results  Component Value Date   ALT 17 12/10/2020   AST 13 12/10/2020   ALKPHOS 63 12/10/2020   BILITOT 0.2 12/10/2020   Lab Results  Component Value Date   HGBA1C 8.8 (A) 11/18/2020   HGBA1C 9.4 (A) 08/12/2020   HGBA1C 8.2 (A) 02/20/2020   HGBA1C 7.1 (H) 04/08/2011   Lab Results  Component Value Date   INSULIN 20.1 12/10/2020   Lab Results  Component Value Date    TSH 1.830 12/10/2020   Lab Results  Component Value Date   CHOL 218 (H) 11/18/2020   HDL 52 11/18/2020   LDLCALC 149 (H) 11/18/2020   TRIG 97 11/18/2020   CHOLHDL 4.2 11/18/2020   Lab Results  Component Value Date   WBC 6.2 12/10/2020   HGB 12.6 12/10/2020   HCT 40.9 12/10/2020   MCV 76 (L) 12/10/2020   PLT 232 12/10/2020   Lab Results  Component Value Date   IRON 30 (L) 02/15/2014   TIBC 398 02/15/2014   FERRITIN 10 02/08/2014    Obesity Behavioral Intervention:   Approximately 15 minutes were spent on the discussion below.  ASK: We discussed the diagnosis of obesity with [name] today and Cloria agreed to give [place] permission to discuss obesity behavioral modification therapy today.  ASSESS: Mckenzi has the diagnosis of obesity and her BMI today is 45.82. Jozlin is in the action stage of change.   ADVISE: Rejoice was educated on the multiple health risks of obesity as well as the benefit of weight loss to improve her health. She was advised of the need for long term treatment and the importance of lifestyle modifications to improve her current health and to decrease her risk of future health problems.  AGREE: Multiple dietary modification options and treatment options were discussed and Deaunna agreed to follow the recommendations documented in the above note.  ARRANGE: Haeli was educated on the importance of frequent visits to treat obesity as outlined per CMS and USPSTF guidelines and agreed to schedule her next follow up appointment today.  Attestation Statements:   Reviewed by clinician on day of visit: allergies, medications, problem list, medical history, surgical history, family history, social history, and previous encounter notes.   I, Trixie Dredge, am acting as transcriptionist for Dennard Nip, MD.  I have reviewed the above documentation for accuracy and completeness, and I agree with the above. -  Dennard Nip, MD

## 2020-12-31 DIAGNOSIS — H524 Presbyopia: Secondary | ICD-10-CM | POA: Diagnosis not present

## 2020-12-31 DIAGNOSIS — E119 Type 2 diabetes mellitus without complications: Secondary | ICD-10-CM | POA: Diagnosis not present

## 2021-01-07 ENCOUNTER — Other Ambulatory Visit: Payer: Self-pay

## 2021-01-07 ENCOUNTER — Ambulatory Visit (INDEPENDENT_AMBULATORY_CARE_PROVIDER_SITE_OTHER): Payer: Medicare Other | Admitting: Family Medicine

## 2021-01-07 ENCOUNTER — Encounter (INDEPENDENT_AMBULATORY_CARE_PROVIDER_SITE_OTHER): Payer: Self-pay | Admitting: Family Medicine

## 2021-01-07 VITALS — BP 141/87 | HR 87 | Temp 98.3°F | Ht 61.0 in | Wt 237.0 lb

## 2021-01-07 DIAGNOSIS — E66813 Obesity, class 3: Secondary | ICD-10-CM

## 2021-01-07 DIAGNOSIS — Z6841 Body Mass Index (BMI) 40.0 and over, adult: Secondary | ICD-10-CM

## 2021-01-07 DIAGNOSIS — E1169 Type 2 diabetes mellitus with other specified complication: Secondary | ICD-10-CM | POA: Diagnosis not present

## 2021-01-08 ENCOUNTER — Encounter (INDEPENDENT_AMBULATORY_CARE_PROVIDER_SITE_OTHER): Payer: Self-pay | Admitting: Family Medicine

## 2021-01-08 NOTE — Progress Notes (Signed)
Chief Complaint:   OBESITY [name] is here to discuss her progress with her obesity treatment plan along with follow-up of her obesity related diagnoses. [name] is on the Category 2 Plan + 100 and keeping a food journal and adhering to recommended goals of 250-350 calories and 20 protein and states she is following her eating plan approximately 75% of the time. [name] states she is not currently exercising.  Today's visit was #: 3 Starting weight: 246 lbs Starting date: 12/10/2020 Today's weight: 237 lbs Today's date: 01/07/2021 Total lbs lost to date: 9 Total lbs lost since last in-office visit: 2  Interim History: [name] admits to not always getting in all of her protein. She is not weighing her meat. However, her hunger is satisfied. She enjoys her snack calories.  Subjective:   1. Type 2 diabetes mellitus with other specified complication, without long-term current use of insulin (HCC) [name]'s last A1c was 8.8 (above goal). She denies hypoglycemia. She is on Trulicity, Metformin, and Jardiance. Her fasting BG 120's and post-prandial 135-140's. Her CBG's have been lower since starting on plan.  Assessment/Plan:   1. Type 2 diabetes mellitus with other specified complication, without long-term current use of insulin ([place]) . Continue current treatment plan.  2. Obesity with current BMI of 44.8 [name] is currently in the action stage of change. As such, her goal is to continue with weight loss efforts. She has agreed to the Category 2 Plan + 100 and keeping a food journal and adhering to recommended goals of 250-350 calories and 20 g protein with breakfast.   Handout: Protein Equivalents  Exercise goals: Exercise videos 2-3 days a week  Behavioral modification strategies: meal planning and cooking strategies.  [name] has agreed to follow-up with our clinic in 2 weeks with [name] and 4 weeks with [name].  Objective:   Blood pressure (!) 141/87, pulse 87, temperature 98.3 F (36.8 C),  height 5\' 1"  (1.549 m), weight 237 lb (107.5 kg), SpO2 95 %. Body mass index is 44.78 kg/m.  General: Cooperative, alert, well developed, in no acute distress. HEENT: Conjunctivae and lids unremarkable. Cardiovascular: Regular rhythm.  Lungs: Normal work of breathing. Neurologic: No focal deficits.   Lab Results  Component Value Date   CREATININE 1.47 (H) 12/10/2020   BUN 25 12/10/2020   NA 141 12/10/2020   K 4.3 12/10/2020   CL 103 12/10/2020   CO2 23 12/10/2020   Lab Results  Component Value Date   ALT 17 12/10/2020   AST 13 12/10/2020   ALKPHOS 63 12/10/2020   BILITOT 0.2 12/10/2020   Lab Results  Component Value Date   HGBA1C 8.8 (A) 11/18/2020   HGBA1C 9.4 (A) 08/12/2020   HGBA1C 8.2 (A) 02/20/2020   HGBA1C 7.1 (H) 04/08/2011   Lab Results  Component Value Date   INSULIN 20.1 12/10/2020   Lab Results  Component Value Date   TSH 1.830 12/10/2020   Lab Results  Component Value Date   CHOL 218 (H) 11/18/2020   HDL 52 11/18/2020   LDLCALC 149 (H) 11/18/2020   TRIG 97 11/18/2020   CHOLHDL 4.2 11/18/2020   Lab Results  Component Value Date   WBC 6.2 12/10/2020   HGB 12.6 12/10/2020   HCT 40.9 12/10/2020   MCV 76 (L) 12/10/2020   PLT 232 12/10/2020   Lab Results  Component Value Date   IRON 30 (L) 02/15/2014   TIBC 398 02/15/2014   FERRITIN 10 02/08/2014    Obesity Behavioral  Intervention:   Approximately 15 minutes were spent on the discussion below.  ASK: We discussed the diagnosis of obesity with [name] today and [name] agreed to give [place] permission to discuss obesity behavioral modification therapy today.  ASSESS: [name] has the diagnosis of obesity and her BMI today is 44.9. [name] is in the action stage of change.   ADVISE: [name] was educated on the multiple health risks of obesity as well as the benefit of weight loss to improve her health. She was advised of the need for long term treatment and the importance of lifestyle modifications to improve  her current health and to decrease her risk of future health problems.  AGREE: Multiple dietary modification options and treatment options were discussed and [name] agreed to follow the recommendations documented in the above note.  ARRANGE: [name] was educated on the importance of frequent visits to treat obesity as outlined per CMS and USPSTF guidelines and agreed to schedule her next follow up appointment today.  Attestation Statements:   Reviewed by clinician on day of visit: allergies, medications, problem list, medical history, surgical history, family history, social history, and previous encounter notes.  Coral Ceo, am acting as Location manager for [employer], [place].  I have reviewed the above documentation for accuracy and completeness, and I agree with the above. -  Georgianne Fick, FNP

## 2021-01-09 ENCOUNTER — Other Ambulatory Visit: Payer: Self-pay | Admitting: Pulmonary Disease

## 2021-01-13 NOTE — Progress Notes (Unsigned)
Office: [phone]  /  Fax: [phone]    Date: Jan 27, 2021   Appointment Start Time: *** Duration: *** minutes Provider: Glennie Isle, Psy.D. Type of Session: Individual Therapy  Location of Patient: {gbptloc:23249} Location of Provider: Provider's Home (private office) Type of Contact: Telepsychological Visit via MyChart Video Visit  Session Content: [name] is a 70 y.o. female presenting for a follow-up appointment to address the previously established treatment goal of increasing coping skills. Today's appointment was a telepsychological visit due to COVID-19. [name] provided verbal consent for today's telepsychological appointment and she is aware she is responsible for securing confidentiality on her end of the session. Prior to proceeding with today's appointment, [name]'s physical location at the time of this appointment was obtained as well a phone number she could be reached at in the event of technical difficulties. Sharda and this provider participated in today's telepsychological service.   This provider conducted a brief check-in and verbally administered the PHQ-9 and GAD-7. *** [name] was receptive to today's appointment as evidenced by openness to sharing, responsiveness to feedback, and {gbreceptiveness:23401}.  Mental Status Examination:  Appearance: {Appearance:22431} Behavior: {Behavior:22445} Mood: {gbmood:21757} Affect: {Affect:22436} Speech: {Speech:22432} Eye Contact: {Eye Contact:22433} Psychomotor Activity: {Motor Activity:22434} Gait: {gbgait:23404} Thought Process: {thought process:22448}  Thought Content/Perception: {disturbances:22451} Orientation: {Orientation:22437} Memory/Concentration: {gbcognition:22449} Insight/Judgment: {Insight:22446}  Structured Assessments Results: The Patient Health Questionnaire-9 (PHQ-9) is a self-report measure that assesses symptoms and severity of depression over the course of the last two weeks. [name] obtained a score of ***  suggesting {GBPHQ9SEVERITY:21752}. [name] finds the endorsed symptoms to be {gbphq9difficulty:21754}. [0= Not at all; 1= Several days; 2= More than half the days; 3= Nearly every day] Little interest or pleasure in doing things ***  Feeling down, depressed, or hopeless ***  Trouble falling or staying asleep, or sleeping too much ***  Feeling tired or having little energy ***  Poor appetite or overeating ***  Feeling bad about yourself --- or that you are a failure or have let yourself or your family down ***  Trouble concentrating on things, such as reading the newspaper or watching television ***  Moving or speaking so slowly that other people could have noticed? Or the opposite --- being so fidgety or restless that you have been moving around a lot more than usual ***  Thoughts that you would be better off dead or hurting yourself in some way ***  PHQ-9 Score ***    The Generalized Anxiety Disorder-7 (GAD-7) is a brief self-report measure that assesses symptoms of anxiety over the course of the last two weeks. [name] obtained a score of *** suggesting {gbgad7severity:21753}. [name] finds the endorsed symptoms to be {gbphq9difficulty:21754}. 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 [name] has demonstrated progress in her goal as  evidenced by {gbtxprogress:22839}. [name] also {gbtxprogress2:22951}.  Plan: The next appointment will be scheduled in {  gbweeks:21758}, which will be {gbtxmodality:23402}. The next session will focus on {Plan for Next Appointment:23400}.

## 2021-01-20 ENCOUNTER — Ambulatory Visit (INDEPENDENT_AMBULATORY_CARE_PROVIDER_SITE_OTHER): Payer: Medicare Other | Admitting: Family Medicine

## 2021-01-22 ENCOUNTER — Other Ambulatory Visit: Payer: Self-pay | Admitting: Family Medicine

## 2021-01-22 DIAGNOSIS — Z1231 Encounter for screening mammogram for malignant neoplasm of breast: Secondary | ICD-10-CM

## 2021-01-27 ENCOUNTER — Telehealth (INDEPENDENT_AMBULATORY_CARE_PROVIDER_SITE_OTHER): Payer: Self-pay | Admitting: Psychology

## 2021-01-27 ENCOUNTER — Telehealth (INDEPENDENT_AMBULATORY_CARE_PROVIDER_SITE_OTHER): Payer: Medicare Other | Admitting: Psychology

## 2021-02-03 ENCOUNTER — Ambulatory Visit (INDEPENDENT_AMBULATORY_CARE_PROVIDER_SITE_OTHER): Payer: Medicare Other | Admitting: Family Medicine

## 2021-02-11 NOTE — Progress Notes (Deleted)
Subjective:   Patient ID: [name]    DOB: 07/13/1951, 70 y.o. female   MRN: [id number]  [name] is a 70 y.o. female with a history of HTN, allergic rhinitis and asthma, OSA, GERD, DM, HLD< moderate osteopenia, CKD III, h/o breast cancer, obesity, iron deficiency anemia, SOB, thalassemia, vitamin d deficiency here for follow up  Diabetes: Last three A1C's below. Currently on Trulicity 0.75mg  qeekly, Metformin 500mg  QD, Jardiance 10mg  QD. Endorses compliance. Notes CBGs range ***. Denies any hypoglycemia. Denies any polyuria, polydipsia, polyphagia. Due for diabetic eye exam. Currently being seen at [facility]. Last weight 237lbs on 01/07/21. Wt today ***   Dexcom: document in plan "A1C ***. Currently monitoring and taking >3 injection per day". Initial RX send message to RN team. Refills - just send to pharmacy "Patient is a good candidate for a CGM device given history of hypoglycemia and using 3 insulin injections per day."  - Continuous Blood Gluc Receiver (DEXCOM G6 RECEIVER) DEVI  - Continuous Blood Gluc Sensor (DEXCOM G6 SENSOR) MISC  - Continuous Blood Gluc Transmit (DEXCOM G6 TRANSMITTER) MISC Lantus/Novolug/Humalog Kwikpens/GLP-1 pens - still need to order BD Insulin Pen Needle (B-D UF III MINI PEN NEEDLES) 31G X 5 MM MISC  Lab Results  Component Value Date   HGBA1C 8.8 (A) 11/18/2020   HGBA1C 9.4 (A) 08/12/2020   HGBA1C 8.2 (A) 02/20/2020    HTN:    today. Currently on Coreg 12.5mg  BID, Losartan-HCTZ 100-25mg  QD. Endorses compliance. Non-smoker/Current everyday smoker***. Denies any chest pain, SOB, vision changes, or headaches.   Lab Results  Component Value Date   CREATININE 1.47 (H) 12/10/2020   CREATININE 1.68 (H) 11/18/2020   CREATININE 1.50 (H) 03/26/2020   HLD: Last lipid panel below. Currently on Crestor 20mg  QDs. Endorses compliance. Denies any muscles aches or weakness. The 10-year ASCVD risk score Mikey Bussing DC Jr., et al., 2013) is:  35.2%   Lab Results  Component Value Date   CHOL 218 (H) 11/18/2020   HDL 52 11/18/2020   LDLCALC 149 (H) 11/18/2020   TRIG 97 11/18/2020   CHOLHDL 4.2 11/18/2020    Health Maintenance: Health Maintenance Due  Topic  . TETANUS/TDAP   . COVID-19 Vaccine (4 - Booster)  . OPHTHALMOLOGY EXAM    Review of Systems:  Per HPI.   Objective:   There were no vitals taken for this visit. Vitals and nursing note reviewed.  General: pleasant ***, sitting comfortably in exam chair, well nourished, well developed, in no acute distress with non-toxic appearance HEENT: normocephalic, atraumatic, moist mucous membranes, oropharynx clear without erythema or exudate, TM normal bilaterally  Neck: supple, non-tender without lymphadenopathy CV: regular rate and rhythm without murmurs, rubs, or gallops, no lower extremity edema, 2+ radial and pedal pulses bilaterally Lungs: clear to auscultation bilaterally with normal work of breathing on room air Resp: breathing comfortably on room air, speaking in full sentences Abdomen: soft, non-tender, non-distended, no masses or organomegaly palpable, normoactive bowel sounds Skin: warm, dry, no rashes or lesions Extremities: warm and well perfused, normal tone MSK: ROM grossly intact, strength intact, gait normal Neuro: Alert and oriented, speech normal  Assessment & Plan:   No problem-specific Assessment & Plan notes found for this encounter.  No orders of the defined types were placed in this encounter.  No orders of the defined types were placed in this encounter.  History of Iron deficiency anemia: Not on iron supplement. Last Hgb 12.6 on 12/10/20. Denies acute  bleed. UTD on colon cancer screening.  - ferritin today. If normal will resolve issue.  CKD IIIb Chronic, stable. Established with nephrology.  DM: Chronic  HLD:   {    This will disappear when note is signed, click to select method of visit    :1}  Mina Marble, DO PGY-3,  [facility] 02/11/2021 11:10 AM

## 2021-02-12 ENCOUNTER — Ambulatory Visit (INDEPENDENT_AMBULATORY_CARE_PROVIDER_SITE_OTHER): Payer: Medicare Other | Admitting: Family Medicine

## 2021-02-14 ENCOUNTER — Ambulatory Visit: Payer: Medicare Other | Admitting: Family Medicine

## 2021-02-14 DIAGNOSIS — E1169 Type 2 diabetes mellitus with other specified complication: Secondary | ICD-10-CM

## 2021-02-14 DIAGNOSIS — N1832 Chronic kidney disease, stage 3b: Secondary | ICD-10-CM

## 2021-02-26 ENCOUNTER — Ambulatory Visit (INDEPENDENT_AMBULATORY_CARE_PROVIDER_SITE_OTHER): Payer: Medicare Other | Admitting: Family Medicine

## 2021-03-04 ENCOUNTER — Ambulatory Visit (INDEPENDENT_AMBULATORY_CARE_PROVIDER_SITE_OTHER): Payer: Medicare Other

## 2021-03-04 ENCOUNTER — Other Ambulatory Visit: Payer: Self-pay

## 2021-03-04 VITALS — Ht 62.0 in | Wt 236.0 lb

## 2021-03-04 DIAGNOSIS — Z23 Encounter for immunization: Secondary | ICD-10-CM

## 2021-03-04 DIAGNOSIS — Z0000 Encounter for general adult medical examination without abnormal findings: Secondary | ICD-10-CM | POA: Diagnosis not present

## 2021-03-04 NOTE — Patient Instructions (Addendum)
You spoke to Dorna Bloom, [place] for your annual wellness visit.  We discussed goals: Goals    . Exercise 3x per week (30 min per time)     [employer] we discussed 3x per week      We also discussed recommended health maintenance. As discussed, you are due for the following. Shingles and Tdap Vaccines can be given at your pharmacy at anytime. We need to get your records from recent eye exam.  Health Maintenance  Topic Date Due  . Pneumococcal Vaccine 87-81 Years old (1 of 4 - PCV13) Never done  . TETANUS/TDAP  Never done  . Zoster Vaccines- Shingrix (1 of 2) Never done  . COVID-19 Vaccine (4 - Booster) 10/04/2020  . OPHTHALMOLOGY EXAM  10/23/2020  . HEMOGLOBIN A1C  02/15/2021  . FOOT EXAM  02/19/2021  . COLONOSCOPY (Pts 45-98yrs Insurance coverage will need to be confirmed)  04/11/2021  . INFLUENZA VACCINE  04/28/2021  . MAMMOGRAM  06/25/2021  . DEXA SCAN  Completed  . Hepatitis C Screening  Completed  . HPV VACCINES  Aged Out  . PNA vac Low Risk Adult  Discontinued   #2 Covid Booster given today.  Bring your card to next apt so we can update it.  PCP apt scheduled for 6/16 @3 :50pm. Fill out an advance directive packet. Start your workout videos!   Health Maintenance, Female Adopting a healthy lifestyle and getting preventive care are important in promoting health and wellness. Ask your health care provider about:  The right schedule for you to have regular tests and exams.  Things you can do on your own to prevent diseases and keep yourself healthy. What should I know about diet, weight, and exercise? Eat a healthy diet  Eat a diet that includes plenty of vegetables, fruits, low-fat dairy products, and lean protein.  Do not eat a lot of foods that are high in solid fats, added sugars, or sodium.   Maintain a healthy weight Body mass index (BMI) is used to identify weight problems. It estimates body fat based on height and weight. Your health care provider can help  determine your BMI and help you achieve or maintain a healthy weight. Get regular exercise Get regular exercise. This is one of the most important things you can do for your health. Most adults should:  Exercise for at least 150 minutes each week. The exercise should increase your heart rate and make you sweat (moderate-intensity exercise).  Do strengthening exercises at least twice a week. This is in addition to the moderate-intensity exercise.  Spend less time sitting. Even light physical activity can be beneficial. Watch cholesterol and blood lipids Have your blood tested for lipids and cholesterol at 72 years of age, then have this test every 5 years. Have your cholesterol levels checked more often if:  Your lipid or cholesterol levels are high.  You are older than 72 years of age.  You are at high risk for heart disease. What should I know about cancer screening? Depending on your health history and family history, you may need to have cancer screening at various ages. This may include screening for:  Breast cancer.  Cervical cancer.  Colorectal cancer.  Skin cancer.  Lung cancer. What should I know about heart disease, diabetes, and high blood pressure? Blood pressure and heart disease  High blood pressure causes heart disease and increases the risk of stroke. This is more likely to develop in people who have high blood pressure readings,  are of African descent, or are overweight.  Have your blood pressure checked: ? Every 3-5 years if you are 48-69 years of age. ? Every year if you are 45 years old or older. Diabetes Have regular diabetes screenings. This checks your fasting blood sugar level. Have the screening done:  Once every three years after age 51 if you are at a normal weight and have a low risk for diabetes.  More often and at a younger age if you are overweight or have a high risk for diabetes. What should I know about preventing infection? Hepatitis  B If you have a higher risk for hepatitis B, you should be screened for this virus. Talk with your health care provider to find out if you are at risk for hepatitis B infection. Hepatitis C Testing is recommended for:  Everyone born from [birth year] through 1967.  Anyone with known risk factors for hepatitis C. Sexually transmitted infections (STIs)  Get screened for STIs, including gonorrhea and chlamydia, if: ? You are sexually active and are younger than 72 years of age. ? You are older than 72 years of age and your health care provider tells you that you are at risk for this type of infection. ? Your sexual activity has changed since you were last screened, and you are at increased risk for chlamydia or gonorrhea. Ask your health care provider if you are at risk.  Ask your health care provider about whether you are at high risk for HIV. Your health care provider may recommend a prescription medicine to help prevent HIV infection. If you choose to take medicine to prevent HIV, you should first get tested for HIV. You should then be tested every 3 months for as long as you are taking the medicine. Pregnancy  If you are about to stop having your period (premenopausal) and you may become pregnant, seek counseling before you get pregnant.  Take 400 to 800 micrograms (mcg) of folic acid every day if you become pregnant.  Ask for birth control (contraception) if you want to prevent pregnancy. Osteoporosis and menopause Osteoporosis is a disease in which the bones lose minerals and strength with aging. This can result in bone fractures. If you are 71 years old or older, or if you are at risk for osteoporosis and fractures, ask your health care provider if you should:  Be screened for bone loss.  Take a calcium or vitamin D supplement to lower your risk of fractures.  Be given hormone replacement therapy (HRT) to treat symptoms of menopause. Follow these instructions at home: Lifestyle  Do not  use any products that contain nicotine or tobacco, such as cigarettes, e-cigarettes, and chewing tobacco. If you need help quitting, ask your health care provider.  Do not use street drugs.  Do not share needles.  Ask your health care provider for help if you need support or information about quitting drugs. Alcohol use  Do not drink alcohol if: ? Your health care provider tells you not to drink. ? You are pregnant, may be pregnant, or are planning to become pregnant.  If you drink alcohol: ? Limit how much you use to 0-1 drink a day. ? Limit intake if you are breastfeeding.  Be aware of how much alcohol is in your drink. In the [place] one drink equals one 12 oz bottle of beer (355 mL), one 5 oz glass of wine (148 mL), or one 1 oz glass of hard liquor (44 mL). General instructions  Schedule  regular health, dental, and eye exams.  Stay current with your vaccines.  Tell your health care provider if: ? You often feel depressed. ? You have ever been abused or do not feel safe at home. Summary  Adopting a healthy lifestyle and getting preventive care are important in promoting health and wellness.  Follow your health care provider's instructions about healthy diet, exercising, and getting tested or screened for diseases.  Follow your health care provider's instructions on monitoring your cholesterol and blood pressure. This information is not intended to replace advice given to you by your health care provider. Make sure you discuss any questions you have with your health care provider. Document Revised: 09/07/2018 Document Reviewed: 09/07/2018 Elsevier Patient Education  2021 [employer].  Here is an example of what a healthy plate looks like:    ? Make half your plate fruits and vegetables.     ? Focus on whole fruits.     ? Vary your veggies.  ? Make half your grains whole grains. -     ? Look for the word "whole" at the beginning of the ingredients list    ? Some  whole-grain ingredients include whole oats, whole-wheat flour,        whole-grain corn, whole-grain brown rice, and whole rye.  ? Move to low-fat and fat-free milk or yogurt.  ? Vary your protein routine. - Meat, fish, poultry (chicken, [place]), eggs, beans (kidney, pinto), dairy.  ? Drink and eat less sodium, saturated fat, and added sugars.  Our clinic's number is [phone]. Please call with questions or concerns about what we discussed today.

## 2021-03-04 NOTE — Progress Notes (Addendum)
Subjective:   [name] is a 70 y.o. female who presents for Medicare Annual (Subsequent) preventive examination.  Review of Systems: Defer to PCP  Cardiac Risk Factors include: advanced age (>36mn, >>5women);diabetes mellitus;obesity (BMI >30kg/m2);sedentary lifestyle  Objective:   Vitals: Ht _0  (1.575 m)   Wt 236 lb (107 kg)   BMI 43.16 kg/m   Body mass index is 43.16 kg/m.  Advanced Directives 03/04/2021 11/18/2020 02/20/2020 12/26/2019 02/21/2015 05/15/2014 05/10/2014  Does Patient Have a Medical Advance Directive? No No Yes No No No No  Type of Advance Directive - - [place] Does patient want to make changes to medical advance directive? Yes (MAU/Ambulatory/Procedural Areas - Information given) - Yes (ED - send information to MyChart) - - - -  Copy of [place]? - - No - copy requested - - - -  Would patient like information on creating a medical advance directive? No - Guardian declined No - Patient declined No - Patient declined No - Patient declined - Yes - EScientist, clinical (histocompatibility and immunogenetics)given Yes - EScientist, clinical (histocompatibility and immunogenetics)given  Pre-existing out of facility DNR order (yellow form or pink MOST form) - - - - - - -   Tobacco Social History   Tobacco Use  Smoking Status Never Smoker  Smokeless Tobacco Never Used     Clinical Intake:  Pre-visit preparation completed: Yes  Pain Score: 0-No pain  How often do you need to have someone help you when you read instructions, pamphlets, or other written materials from your doctor or pharmacy?: 1 - Never What is the last grade level you completed in school?: Bachelors Degree  Past Medical History:  Diagnosis Date  . Abnormal electrocardiogram (ECG) (EKG)   . Anemia   . Arthritis   . Asthma   . Back pain   . Bilateral swelling of feet   . Blood transfusion 03/14/12  . Bradycardia   . Breast cancer ([place] 03/16/12   right lumpectomy=high grade ductal ca in situ,ER/PR=positive   . Cancer ([facility]    right breast ductal carcinoma in situ  . Chronic kidney disease    stage III   . Diabetes mellitus    type 2  . Dyspnea   . Edema   . GERD (gastroesophageal reflux disease)   . Hiatal hernia    large  . Hypercholesterolemia   . Hypertension    Echocardiogram 03/2011: EF 516-10% grade 1 diastolic dysfunction, mild MAC, mild LAE  . Iron deficiency anemia   . Joint pain   . Morbid obesity ([place]   . Other fatigue   . S/P radiation therapy 05/16/12 - 06/29/12   Right Breast: 50 Gray/ 25 Fractions with Boost: 10 Gray/ 5 Fractions  . Shortness of breath    with exertion  . Shortness of breath on exertion   . Sinus drainage   . Sleep apnea   . Total knee replacement status 05/15/2014  . Use of anastrozole (Arimidex) Started 10/13  . Vitamin D deficiency    Past Surgical History:  Procedure Laterality Date  . Biospy Right Breast  02/01/12   Right Breast Needle Core Biopsy: ductal Carcinoma In situ with Papillary Features, ER/PR Positive  . BREAST EXCISIONAL BIOPSY     left 1998  . BREAST LUMPECTOMY  04/1997  . Lumpectomy Right Breast  03/16/12   Ductal carcinoma In situ: clear margins: 0/1 Node Negative  . TOTAL KNEE ARTHROPLASTY Right 05/15/2014  Procedure: RIGHT TOTAL KNEE ARTHROPLASTY;  Surgeon: Tobi Bastos, MD;  Location: [facility];  Service: Orthopedics;  Laterality: Right;   Family History  Problem Relation Age of Onset  . Heart attack Father 74  . Heart disease Father   . Sudden death Father   . Hypertension Mother   . Diabetes Mother   . Cancer Sister        breast  . Lymphoma Sister        Deceased at age 62   Social History   Socioeconomic History  . Marital status: Married    Spouse name: Suezanne Jacquet  . Number of children: 1  . Years of education: 57  . Highest education level: Bachelor's degree (e.g., BA, AB, BS)  Occupational History    Employer: [employer]    Comment: 2 days per week   Tobacco Use  . Smoking status: Never  Smoker  . Smokeless tobacco: Never Used  Vaping Use  . Vaping Use: Never used  Substance and Sexual Activity  . Alcohol use: No  . Drug use: No  . Sexual activity: Not Currently    Partners: Male    Comment: menarche age 64,last menses age 31,g1,p1,first live birth age 6  Other Topics Concern  . Not on file  Social History Narrative   Patient lives with her husband in [place].    Patient has one son and he lives in [place], [place].   Patient is active in her church and has a close set of friends.   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    Patient is active at the healthy weight center.   Social Determinants of Health   Financial Resource Strain: Low Risk   . Difficulty of Paying Living Expenses: Not hard at all  Food Insecurity: No Food Insecurity  . Worried About Charity fundraiser in the Last Year: Never true  . Ran Out of Food in the Last Year: Never true  Transportation Needs: No Transportation Needs  . Lack of Transportation (Medical): No  . Lack of Transportation (Non-Medical): No  Physical Activity: Inactive  . Days of Exercise per Week: 0 days  . Minutes of Exercise per Session: 0 min  Stress: Stress Concern Present  . Feeling of Stress : To some extent  Social Connections: Socially Integrated  . Frequency of Communication with Friends and Family: More than three times a week  . Frequency of Social Gatherings with Friends and Family: More than three times a week  . Attends Religious Services: More than 4 times per year  . Active Member of Clubs or Organizations: Yes  . Attends Archivist Meetings: More than 4 times per year  . Marital Status: Married   Outpatient Encounter Medications as of 03/04/2021  Medication Sig  . albuterol (PROAIR HFA) 108 (90 Base) MCG/ACT inhaler Inhale 2 puffs into the lungs every 6 (six) hours as needed for wheezing or shortness of breath.  [name] BREO ELLIPTA 200-25 MCG/INH AEPB TAKE 1 PUFF BY MOUTH EVERY  DAY  . carvedilol (COREG) 12.5 MG tablet Take 1 tablet (12.5 mg total) by mouth 2 (two) times daily with a meal.  . cholecalciferol (VITAMIN D) 1000 units tablet Take 2,000 Units by mouth daily.  . empagliflozin (JARDIANCE) 10 MG TABS tablet Take by mouth daily.  [name] Fexofenadine-Pseudoephedrine (ALLEGRA-D 24 HOUR PO) Take 1 tablet by mouth daily.  . fluticasone (FLONASE) 50 MCG/ACT nasal spray Place 2 sprays  into both nostrils daily.  . furosemide (LASIX) 40 MG tablet Take 1 tablet in the morning as needed for swelling.  [name] losartan-hydrochlorothiazide (HYZAAR) 100-25 MG tablet Take 1 tablet by mouth daily.  . metFORMIN (GLUCOPHAGE-XR) 500 MG 24 hr tablet Take 1 tablet (500 mg total) by mouth at bedtime.  . rosuvastatin (CRESTOR) 20 MG tablet Take 1 tablet (20 mg total) by mouth daily.  . vitamin C (ASCORBIC ACID) 500 MG tablet Take 500 mg by mouth daily.  . Vitamin D, Ergocalciferol, (DRISDOL) 1.25 MG (50000 UNIT) CAPS capsule Take 1 capsule (50,000 Units total) by mouth every 7 (seven) days.  . Dulaglutide (TRULICITY) 0.86 VH/8.4ON SOPN Inject 0.75 mg into the skin once a week. (Patient not taking: Reported on 03/04/2021)   No facility-administered encounter medications on file as of 03/04/2021.   Activities of Daily Living In your present state of health, do you have any difficulty performing the following activities: 03/04/2021  Hearing? N  Vision? N  Difficulty concentrating or making decisions? N  Walking or climbing stairs? N  Dressing or bathing? N  Doing errands, shopping? N  Preparing Food and eating ? N  Using the Toilet? N  In the past six months, have you accidently leaked urine? Y  Do you have problems with loss of bowel control? N  Managing your Medications? N  Managing your Finances? N  Housekeeping or managing your Housekeeping? N  Some recent data might be hidden   Patient Care Team: Danna Hefty, DO as PCP - General (Family Medicine) End, Harrell Gave, MD as PCP -  Cardiology (Cardiology)  [facility]- Opthalmology   Assessment:   This is a routine wellness examination for Avigayil.  Exercise Activities and Dietary recommendations Current Exercise Habits: The patient does not participate in regular exercise at present, Exercise limited by: orthopedic condition(s)  Goals    . Exercise 3x per week (30 min per time)     [employer] we discussed 3x per week      Fall Risk Fall Risk  03/04/2021 11/18/2020 02/20/2020 12/26/2019  Falls in the past year? 0 0 0 0  Number falls in past yr: - 0 0 0  Injury with Fall? - 0 0 0  Follow up Falls prevention discussed - - -   Is the patient's home free of loose throw rugs in walkways, pet beds, electrical cords, etc?   yes      Grab bars in the bathroom? yes      Handrails on the stairs?   yes      Adequate lighting?   yes  Patient rating of health (0-10) scale: 8  Depression Screen PHQ 2/9 Scores 03/04/2021 12/10/2020 08/12/2020 02/20/2020  PHQ - 2 Score 1 1 0 0  PHQ- 9 Score - 8 2 -    Cognitive Function 6CIT Screen 03/04/2021  What Year? 0 points  What month? 0 points  What time? 0 points  Count back from 20 0 points  Months in reverse 0 points  Repeat phrase 0 points  Total Score 0   Immunization History  Administered Date(s) Administered  . Fluad Quad(high Dose 65+) 08/12/2020  . Influenza, Quadrivalent, Recombinant, Inj, Pf 06/14/2018  . Influenza-Unspecified 06/29/2019  . PFIZER(Purple Top)SARS-COV-2 Vaccination 11/25/2019, 11/27/2019, 07/04/2020  . Pneumococcal Polysaccharide-23 04/13/2019   Screening Tests Health Maintenance  Topic Date Due  . Pneumococcal Vaccine 20-19 Years old (1 of 4 - PCV13) Never done  . TETANUS/TDAP  Never done  . Zoster  Vaccines- Shingrix (1 of 2) Never done  . COVID-19 Vaccine (4 - Booster) 10/04/2020  . OPHTHALMOLOGY EXAM  10/23/2020  . HEMOGLOBIN A1C  02/15/2021  . FOOT EXAM  02/19/2021  . COLONOSCOPY (Pts 45-69yr Insurance coverage will need to be  confirmed)  04/11/2021  . INFLUENZA VACCINE  04/28/2021  . MAMMOGRAM  06/25/2021  . DEXA SCAN  Completed  . Hepatitis C Screening  Completed  . HPV VACCINES  Aged Out  . PNA vac Low Risk Adult  Discontinued   Cancer Screenings: Lung: Low Dose CT Chest recommended if Age 72-80years, 30 pack-year currently smoking OR have quit w/in 15years. Patient does not qualify. Breast:  Up to date on Mammogram? Yes   Up to date of Bone Density/Dexa? Yes Colorectal: UTD- due July 2022- will ask about Cologuard at PCP visit.   Additional Screenings: Hepatitis C Screening: Completed   Plan:  #2 Covid Booster given today.  Bring your card to next apt so we can update it.  PCP apt scheduled for 6/16 _0 :50pm. Fill out an advance directive packet.  Start your workout videos!   I have personally reviewed and noted the following in the patient's chart:   . Medical and social history . Use of alcohol, tobacco or illicit drugs  . Current medications and supplements . Functional ability and status . Nutritional status . Physical activity . Advanced directives . List of other physicians . Hospitalizations, surgeries, and ER visits in previous 12 months . Vitals . Screenings to include cognitive, depression, and falls . Referrals and appointments  In addition, I have reviewed and discussed with patient certain preventive protocols, quality metrics, and best practice recommendations. A written personalized care plan for preventive services as well as general preventive health recommendations were provided to patient.    EDorna Bloom [place] 03/04/2021    I have reviewed this visit and agree with the documentation.

## 2021-03-13 ENCOUNTER — Other Ambulatory Visit: Payer: Self-pay

## 2021-03-13 ENCOUNTER — Ambulatory Visit (INDEPENDENT_AMBULATORY_CARE_PROVIDER_SITE_OTHER): Payer: Medicare Other | Admitting: Family Medicine

## 2021-03-13 ENCOUNTER — Encounter: Payer: Self-pay | Admitting: Family Medicine

## 2021-03-13 VITALS — BP 164/107 | HR 74 | Wt 244.4 lb

## 2021-03-13 DIAGNOSIS — E119 Type 2 diabetes mellitus without complications: Secondary | ICD-10-CM | POA: Diagnosis not present

## 2021-03-13 DIAGNOSIS — I10 Essential (primary) hypertension: Secondary | ICD-10-CM | POA: Diagnosis not present

## 2021-03-13 DIAGNOSIS — N1832 Chronic kidney disease, stage 3b: Secondary | ICD-10-CM

## 2021-03-13 DIAGNOSIS — D509 Iron deficiency anemia, unspecified: Secondary | ICD-10-CM

## 2021-03-13 DIAGNOSIS — E1169 Type 2 diabetes mellitus with other specified complication: Secondary | ICD-10-CM | POA: Diagnosis not present

## 2021-03-13 DIAGNOSIS — E785 Hyperlipidemia, unspecified: Secondary | ICD-10-CM

## 2021-03-13 LAB — POCT GLYCOSYLATED HEMOGLOBIN (HGB A1C): HbA1c, POC (controlled diabetic range): 7.6 % — AB (ref 0.0–7.0)

## 2021-03-13 MED ORDER — CARVEDILOL 25 MG PO TABS: 25.0000 mg | ORAL_TABLET | Freq: Two times a day (BID) | ORAL | 3 refills | Status: DC

## 2021-03-13 MED ORDER — CANAGLIFLOZIN 300 MG PO TABS: 300.0000 mg | ORAL_TABLET | Freq: Every day | ORAL | 1 refills | Status: DC

## 2021-03-13 MED ORDER — ROSUVASTATIN CALCIUM 40 MG PO TABS: 40.0000 mg | ORAL_TABLET | Freq: Every day | ORAL | 3 refills | Status: DC

## 2021-03-13 MED ORDER — OZEMPIC (0.25 OR 0.5 MG/DOSE) 2 MG/1.5ML ~~LOC~~ SOPN: 0.5000 mg | PEN_INJECTOR | SUBCUTANEOUS | 2 refills | Status: DC

## 2021-03-13 MED ORDER — TRULICITY 0.75 MG/0.5ML ~~LOC~~ SOAJ: 0.7500 mg | SUBCUTANEOUS | 2 refills | Status: DC

## 2021-03-13 NOTE — Progress Notes (Signed)
Subjective:   Patient ID: [name]    DOB: Jun 11, 1951, 70 y.o. female   MRN: [id number]  [name] is a 70 y.o. female with a history of HTN, allergic rhinitis and asthma, OSA, GERD, DM, HLD< moderate osteopenia, CKD III, h/o breast cancer, obesity, iron deficiency anemia, SOB, thalassemia, vitamin d deficiency here for follow up  Diabetes: Last three A1C's below. Currently on Trulicity 0.75mg  Leanne Chang (has been off of this for 1 month, denies any problems with the medication), Metformin 500mg  QD, Jardiance 10mg  QD. Endorses compliance. Denies any hypoglycemia. Denies any polyuria, polydipsia, polyphagia. Due for diabetic eye exam - had this done in April that was normal. Was being seen at [facility] but was recently told it would no longer be covered. Last weight 237lbs on 01/07/21. Wt today 244lbs  Lab Results  Component Value Date   HGBA1C 7.6 (A) 03/13/2021   HGBA1C 8.8 (A) 11/18/2020   HGBA1C 9.4 (A) 08/12/2020    HTN:  BP: (!) 164/107 today. Repeat 154/96. HR 72. Currently on Coreg 12.5mg  BID, Losartan-HCTZ 100-25mg  QD. Endorses compliance. Non-smoker. Denies any chest pain, SOB, vision changes, or headaches.   Lab Results  Component Value Date   CREATININE 1.47 (H) 12/10/2020   CREATININE 1.68 (H) 11/18/2020   CREATININE 1.50 (H) 03/26/2020   HLD: Last lipid panel below. Currently on Crestor 20mg  QDs. Endorses compliance. Denies any muscles aches or weakness. The 10-year ASCVD risk score Mikey Bussing DC Jr., et al., 2013) is: 43.3%   Lab Results  Component Value Date   CHOL 218 (H) 11/18/2020   HDL 52 11/18/2020   LDLCALC 149 (H) 11/18/2020   TRIG 97 11/18/2020   CHOLHDL 4.2 11/18/2020    Health Maintenance: Health Maintenance Due  Topic   Zoster Vaccines- Shingrix (1 of 2)   OPHTHALMOLOGY EXAM    FOOT EXAM    Review of Systems:  Per HPI.   Objective:   BP (!) 164/107   Pulse 74   Wt 244 lb 6.4 oz (110.9 kg)   SpO2 94%   BMI 44.70 kg/m   Vitals and nursing note reviewed.  General: pleasant older female, sitting comfortably in exam chair, well nourished, well developed, in no acute distress with non-toxic appearance Resp: breathing comfortably on room air, speaking in full sentences MSK: gait normal Neuro: Alert and oriented, speech normal  Assessment & Plan:   Essential hypertension Chronic, elevated on multiple readings today. Nonsmoker. CKD IIIb.  History of severe OSA with noncompliance to CPAP. Discussed multiple options including transitioning from HCTZ to chlorthalidone versus increasing beta-blocker.  At this time opted to increase SGLT2 and beta-blocker with plan to repeat blood pressure in 2 weeks.  If still uncontrolled then we can consider the transition from HCTZ to chlorthalidone for better blood pressure control.  Patient was amendable to this plan. Recommend further discussion regarding OSA and CPAP use to help improve blood pressure control.  Diabetes mellitus (HCC) Chronic, improved. Congratulated patient on success.  GLP-1 no longer covered by insurance however she would like to inquire about cost with alternative pharmacy before discontinuing. Requests to compare prices of the Ozempic vs Trulicity as it is also noted to be covered by insurance. She will let me know which is covered and if affordable.  -We will increase SGLT2 to max dose.  Due to insurance preference we will have to transition from [place] to Invokana 300 mg daily. -Continue metformin 500 mg daily (tolerable dose) -Continue GLP-1.  Patient to inform me of which medication is covered or if she is unable to afford the medication. - Encouraged weight loss - Diabetic eye exam completed in April which was normal.  Patient to have results faxed to [facility]. -Follow-up 3 months for repeat A1c - recommend diabetic foot exam at follow up visit  Hyperlipidemia associated with type 2 diabetes mellitus ([place]) Chronic Will increase Crestor to 40 mg  daily to maximize therapy  Chronic kidney disease, stage III (moderate) (HCC) Chronic, stable. Established with nephrology. Nonsmoker. Avoids NSAIDs. Goal: improve BP and diabetes. Changes made today with repeat BP scheduled in 2 weeks.  Iron deficiency anemia Not on iron supplement. Last Hgb 12.6 on 12/10/20. Denies acute bleed. UTD on colon cancer screening.  Per chart review does appear she has chronic thalassemia which may contribute to chronic microcytosis. -Recommend ferritin at follow-up visit. If normal, will resolve issue.  Orders Placed This Encounter  Procedures   HgB A1c   Meds ordered this encounter  Medications   Semaglutide,0.25 or 0.5MG /DOS, (OZEMPIC, 0.25 OR 0.5 MG/DOSE,) 2 MG/1.5ML SOPN    Sig: Inject 0.5 mg into the skin once a week.    Dispense:  3 mL    Refill:  2   Dulaglutide (TRULICITY) 7.78 EU/2.3NT SOPN    Sig: Inject 0.75 mg into the skin once a week.    Dispense:  2 mL    Refill:  2   rosuvastatin (CRESTOR) 40 MG tablet    Sig: Take 1 tablet (40 mg total) by mouth daily.    Dispense:  90 tablet    Refill:  3   canagliflozin (INVOKANA) 300 MG TABS tablet    Sig: Take 1 tablet (300 mg total) by mouth daily before breakfast.    Dispense:  90 tablet    Refill:  1   carvedilol (COREG) 25 MG tablet    Sig: Take 1 tablet (25 mg total) by mouth 2 (two) times daily with a meal.    Dispense:  180 tablet    Refill:  [place], DO PGY-3, [facility] 03/13/2021 5:21 PM

## 2021-03-13 NOTE — Assessment & Plan Note (Signed)
Chronic, stable. Established with nephrology. Nonsmoker. Avoids NSAIDs. Goal: improve BP and diabetes. Changes made today with repeat BP scheduled in 2 weeks.

## 2021-03-13 NOTE — Assessment & Plan Note (Addendum)
Chronic Will increase Crestor to 40 mg daily to maximize therapy

## 2021-03-13 NOTE — Assessment & Plan Note (Addendum)
Chronic, improved. Congratulated patient on success.  GLP-1 no longer covered by insurance however she would like to inquire about cost with alternative pharmacy before discontinuing. Requests to compare prices of the Ozempic vs Trulicity as it is also noted to be covered by insurance. She will let me know which is covered and if affordable.  -We will increase SGLT2 to max dose.  Due to insurance preference we will have to transition from [place] to Invokana 300 mg daily. -Continue metformin 500 mg daily (tolerable dose) -Continue GLP-1.  Patient to inform me of which medication is covered or if she is unable to afford the medication. - Encouraged weight loss - Diabetic eye exam completed in April which was normal.  Patient to have results faxed to [facility]. -Follow-up 3 months for repeat A1c - recommend diabetic foot exam at follow up visit

## 2021-03-13 NOTE — Patient Instructions (Addendum)
Diabetes: Transitioning to [place] (replacing Jardiance) due to insurance coverage. Take this daily.   - I have called in Trulicy and Ozempic for you to compare prices. Let me know if they are affordable and which one is more affordable and you choose.  - Continue the Metformin  Blood pressure:   - Continue your Losartan-HCTZ as prescribed  - I am increasing your Coreg (Carvedilol) to 25mg  twice a day  - Follow up in 2 week for blood pressure check  Cholesterol: - I have increased the Crestor to 40mg .

## 2021-03-13 NOTE — Assessment & Plan Note (Addendum)
Not on iron supplement. Last Hgb 12.6 on 12/10/20. Denies acute bleed. UTD on colon cancer screening.  Per chart review does appear she has chronic thalassemia which may contribute to chronic microcytosis. -Recommend ferritin at follow-up visit. If normal, will resolve issue.

## 2021-03-13 NOTE — Assessment & Plan Note (Addendum)
Chronic, elevated on multiple readings today. Nonsmoker. CKD IIIb.  History of severe OSA with noncompliance to CPAP. Discussed multiple options including transitioning from HCTZ to chlorthalidone versus increasing beta-blocker.  At this time opted to increase SGLT2 and beta-blocker with plan to repeat blood pressure in 2 weeks.  If still uncontrolled then we can consider the transition from HCTZ to chlorthalidone for better blood pressure control.  Patient was amendable to this plan. Recommend further discussion regarding OSA and CPAP use to help improve blood pressure control.

## 2021-03-17 ENCOUNTER — Telehealth: Payer: Self-pay

## 2021-03-17 NOTE — Telephone Encounter (Signed)
Received PA request for Ozempic. Called patient to verify if she had discussed with pharmacy Ozempic vs Trulicity. Patient did not answer. Instructed patient to return call to nurse line to discuss further.   Called pharmacy. Trulicity will cost $01 for a one month supply. She is unable to see the cost of the Ozempic. However, informed that the Ozempic is not covered by patient's insurance.   Will await returned phone call from patient.   Talbot Grumbling, RN

## 2021-03-18 DIAGNOSIS — D631 Anemia in chronic kidney disease: Secondary | ICD-10-CM | POA: Diagnosis not present

## 2021-03-18 DIAGNOSIS — E1122 Type 2 diabetes mellitus with diabetic chronic kidney disease: Secondary | ICD-10-CM | POA: Diagnosis not present

## 2021-03-18 DIAGNOSIS — I129 Hypertensive chronic kidney disease with stage 1 through stage 4 chronic kidney disease, or unspecified chronic kidney disease: Secondary | ICD-10-CM | POA: Diagnosis not present

## 2021-03-18 DIAGNOSIS — N183 Chronic kidney disease, stage 3 unspecified: Secondary | ICD-10-CM | POA: Diagnosis not present

## 2021-04-01 ENCOUNTER — Ambulatory Visit: Payer: Medicare Other

## 2021-04-03 DIAGNOSIS — M25562 Pain in left knee: Secondary | ICD-10-CM | POA: Diagnosis not present

## 2021-04-03 DIAGNOSIS — M1712 Unilateral primary osteoarthritis, left knee: Secondary | ICD-10-CM | POA: Diagnosis not present

## 2021-04-07 ENCOUNTER — Ambulatory Visit: Payer: Medicare Other

## 2021-04-07 ENCOUNTER — Other Ambulatory Visit: Payer: Self-pay

## 2021-04-07 VITALS — BP 138/86

## 2021-04-07 DIAGNOSIS — I10 Essential (primary) hypertension: Secondary | ICD-10-CM

## 2021-04-07 NOTE — Progress Notes (Signed)
Patient here today for BP check.      Last BP was on 164/107.  BP today is 138/86.  Checked BP in left arm with large cuff.    Symptoms present: None.   Patient has been taking Coreg 25mg  BID and Losartan-HCTZ 100-25mg  as prescribed.  Patient last took BP med today. Routed note to PCP.      PCP apt scheduled for 7/25.

## 2021-04-20 NOTE — Progress Notes (Signed)
[name]    SUBJECTIVE:   CHIEF COMPLAINT / HPI:   [name] is a 70 yo who presents for a BP check and meet PCP. She was recently seen at RN visit for BP check which was 138/86, and prior to that visit was 164/107. Today BP at goal, at 112/68. Denies recent CP, palpitations, SOB.   She endorses losing weight over the past couple of years and watching her intake. She was previously being seen at [facility] but stopped going. Weight has started to increase recently and she states she will likely return back.   She also requests for refills of Metformin, test strips, and Flonase.   States she was contacted by [facility] regarding repeat colonoscopy. Denies blood in stool    OBJECTIVE:   BP 112/68   Pulse 77   Ht '5\' 2"'$  (1.575 m)   Wt 244 lb 3.2 oz (110.8 kg)   BMI 44.66 kg/m    General: alert, pleasant, NAD CV: RRR no murmurs Resp: CTAB normal WOB GI: soft, non distended Derm: no visible rashes or lesions. No LE edema   ASSESSMENT/PLAN:   No problem-specific Assessment & Plan notes found for this encounter.   Hypertension BP today 112/68 which is at goal. She has been asymptomatic and tolerating medication well. Patient to continue Coreg '25mg'$  BID, Losartan-HCTZ 100-'25mg'$  daily, Lasix '40mg'$  as needed for swelling  Misc Refilled Metformin, Flonase, test strips   Follow up in 2 months to check A1c and do foot exam    [place]

## 2021-04-21 ENCOUNTER — Other Ambulatory Visit: Payer: Self-pay

## 2021-04-21 ENCOUNTER — Encounter: Payer: Self-pay | Admitting: Family Medicine

## 2021-04-21 ENCOUNTER — Ambulatory Visit (INDEPENDENT_AMBULATORY_CARE_PROVIDER_SITE_OTHER): Payer: Medicare Other | Admitting: Family Medicine

## 2021-04-21 DIAGNOSIS — E119 Type 2 diabetes mellitus without complications: Secondary | ICD-10-CM

## 2021-04-21 MED ORDER — METFORMIN HCL ER 500 MG PO TB24: 500.0000 mg | ORAL_TABLET | Freq: Every day | ORAL | 2 refills | Status: DC

## 2021-04-21 MED ORDER — FLUTICASONE PROPIONATE 50 MCG/ACT NA SUSP: 2.0000 | Freq: Every day | NASAL | 2 refills | Status: DC

## 2021-04-21 MED ORDER — GNP TRUE METRIX GLUCOSE STRIPS VI STRP: ORAL_STRIP | 12 refills | Status: AC

## 2021-04-21 NOTE — Patient Instructions (Signed)
It was great meeting you today!  Today we check on your blood pressure which is at goal!  I have also refilled your metformin, your test strips, and Flonase.  Please check-out at the front desk before leaving the clinic. I'd like to see you back in 2 months to check your sugars, and do foot exam, but if you need to be seen earlier than that for any new issues we're happy to fit you in, just give [place] a call!  Visit Reminders: - Stop by the pharmacy to pick up your prescriptions  - Continue to work on your healthy eating habits and incorporating exercise into your daily life.    Feel free to call with any questions or concerns at any time, at [phone].   Take care,  Dr. Shary Key [facility]

## 2021-06-12 ENCOUNTER — Telehealth: Payer: Self-pay

## 2021-06-12 NOTE — Telephone Encounter (Signed)
Patient calls nurse line regarding congestion, runny nose and coughing. Denies fever, body aches and sore throat. Patient has taken two negative home COVID tests.   Patient is requesting medication to help with symptoms. Patient has been taking OTC mucinex and flonase for symptoms.   Patient is requesting to not come into the office at this time. We also do not have any available appointments for the rest of the week.   Please advise.   Talbot Grumbling, RN

## 2021-06-13 NOTE — Telephone Encounter (Signed)
Patient returns call to nurse line. Advised of provider recommendations. Scheduled patient for CIDD clinic on Tuesday afternoon.   Talbot Grumbling, RN

## 2021-06-17 ENCOUNTER — Ambulatory Visit: Payer: Medicare Other

## 2021-06-23 ENCOUNTER — Encounter: Payer: Self-pay | Admitting: Family Medicine

## 2021-06-23 ENCOUNTER — Ambulatory Visit (INDEPENDENT_AMBULATORY_CARE_PROVIDER_SITE_OTHER): Payer: Medicare Other | Admitting: Family Medicine

## 2021-06-23 ENCOUNTER — Other Ambulatory Visit: Payer: Self-pay

## 2021-06-23 VITALS — BP 158/84 | HR 73 | Wt 249.6 lb

## 2021-06-23 DIAGNOSIS — E1159 Type 2 diabetes mellitus with other circulatory complications: Secondary | ICD-10-CM | POA: Diagnosis not present

## 2021-06-23 DIAGNOSIS — I152 Hypertension secondary to endocrine disorders: Secondary | ICD-10-CM

## 2021-06-23 DIAGNOSIS — E1169 Type 2 diabetes mellitus with other specified complication: Secondary | ICD-10-CM

## 2021-06-23 LAB — POCT GLYCOSYLATED HEMOGLOBIN (HGB A1C): HbA1c, POC (controlled diabetic range): 8.2 % — AB (ref 0.0–7.0)

## 2021-06-23 NOTE — Progress Notes (Signed)
    SUBJECTIVE:   CHIEF COMPLAINT / HPI:   70 y.o. female for follow up of diabetes.  Last A1c 7.6 3 months ago. She states AM fasting blood sugars between 120-130. Currently taking Canagliflozin 300mg , Metformin 500mg , and was supposed to be on Ozempic. States she has been having issues with cost of medication. Denies polyuria or polydipsia, numbness, tingling or pain in extremities.  Weight 249, up from 244 in July and 236 in June. Ws 267 May of last year. She does admit that her diet has not been the best lately. She was [facility] a [facility].   HTN: BP: 163/89 . On repeat 158/84. Denies CP, headaches, change in vision   Health maintenance; needs colonscopy ,eye exam    OBJECTIVE:   BP (!) 163/89   Pulse 73   Wt 249 lb 9.6 oz (113.2 kg)   SpO2 94%   BMI 45.65 kg/m    Physical exam General: well appearing, NAD Cardiovascular: RRR, no murmurs Lungs: CTAB. Normal WOB Abdomen: soft, non-distended, non-tender Skin: warm, dry. No edema  ASSESSMENT/PLAN:   No problem-specific Assessment & Plan notes found for this encounter.   DM2  A1c today 8.2, up from 7.6 in June. Currently taking Canagliflozin 300mg , Metformin 500mg , and previously on Semaglutide .5mg  weekly. She has been having issues with the cost of some of her medications. Referred patient to Dr. Valentina Lucks to help patient with her medication regimen and affordability of medications. It would be good if insurance is able to cover a GLP-1 agonist like Ozempic that she was on before to help with diabetes and weight loss. She has room to go up on Metformin as well.  - f/u with Dr. Valentina Lucks scheduled - f/u with myself after above appointment to also check on BP  - foot exam on follow up   Obesity BMI 45.  - discussed her calling [facility] to get back in with them   HTN BP elevated today: 163/89, and on repeat 158/84. Currently taking Coreg 25mg , Hyzaar 100-25mg ,  Lasix 40mg  once or twice a week. Recommended patient taking BP at home, and following up with me on measurements so we can adjust medication  - f/u in 2 weeks for BP check and adjustment of medication   Health maintenance - flu vaccine  - Needs to schedule colonsocpy   [place]

## 2021-06-23 NOTE — Patient Instructions (Addendum)
It was great seeing you today!  Today we followed up on your diabetes, and did your foot exam.  Your A1c has increased to 8.2, so it will be important to make the dietary changes we discussed, and call to get back in with the [facility].  We also discussed you talking to our pharmacist Dr. Valentina Lucks to ensure you are on the right regimen and to see about any potential medication savings.   Your blood pressure was elevated today and on repeat check. Please check your blood pressure in the morning and at night for at least 3 days and bring them to me. We may need to make adjustments to your medication.   Please check-out at the front desk before leaving the clinic. Have the front schedule you to see the pharmacy team, and then back with me in 2-4 weeks for follow up on blood pressure, but if you need to be seen earlier than that for any new issues we're happy to fit you in, just give [place] a call!  Feel free to call with any questions or concerns at any time, at [phone].   Take care,  Dr. Shary [facility]

## 2021-06-30 ENCOUNTER — Ambulatory Visit: Payer: Medicare Other

## 2021-06-30 ENCOUNTER — Ambulatory Visit: Payer: Medicare Other | Admitting: Pharmacist

## 2021-07-07 ENCOUNTER — Ambulatory Visit: Payer: Medicare Other | Admitting: Pharmacist

## 2021-07-10 DIAGNOSIS — M1712 Unilateral primary osteoarthritis, left knee: Secondary | ICD-10-CM | POA: Diagnosis not present

## 2021-07-10 DIAGNOSIS — M25562 Pain in left knee: Secondary | ICD-10-CM | POA: Diagnosis not present

## 2021-07-11 ENCOUNTER — Ambulatory Visit: Payer: Medicare Other

## 2021-07-31 DIAGNOSIS — M1712 Unilateral primary osteoarthritis, left knee: Secondary | ICD-10-CM | POA: Diagnosis not present

## 2021-07-31 DIAGNOSIS — M25562 Pain in left knee: Secondary | ICD-10-CM | POA: Diagnosis not present

## 2021-08-07 DIAGNOSIS — M1712 Unilateral primary osteoarthritis, left knee: Secondary | ICD-10-CM | POA: Diagnosis not present

## 2021-08-07 DIAGNOSIS — M25562 Pain in left knee: Secondary | ICD-10-CM | POA: Diagnosis not present

## 2021-08-14 DIAGNOSIS — M25562 Pain in left knee: Secondary | ICD-10-CM | POA: Diagnosis not present

## 2021-08-14 DIAGNOSIS — M1712 Unilateral primary osteoarthritis, left knee: Secondary | ICD-10-CM | POA: Diagnosis not present

## 2021-08-28 DIAGNOSIS — M1712 Unilateral primary osteoarthritis, left knee: Secondary | ICD-10-CM | POA: Diagnosis not present

## 2021-08-28 DIAGNOSIS — M25562 Pain in left knee: Secondary | ICD-10-CM | POA: Diagnosis not present

## 2021-09-02 ENCOUNTER — Ambulatory Visit: Payer: Medicare Other

## 2021-09-03 DIAGNOSIS — M1712 Unilateral primary osteoarthritis, left knee: Secondary | ICD-10-CM | POA: Diagnosis not present

## 2021-09-03 DIAGNOSIS — M25562 Pain in left knee: Secondary | ICD-10-CM | POA: Diagnosis not present

## 2021-09-05 DIAGNOSIS — E1122 Type 2 diabetes mellitus with diabetic chronic kidney disease: Secondary | ICD-10-CM | POA: Diagnosis not present

## 2021-09-05 DIAGNOSIS — D631 Anemia in chronic kidney disease: Secondary | ICD-10-CM | POA: Diagnosis not present

## 2021-09-05 DIAGNOSIS — I129 Hypertensive chronic kidney disease with stage 1 through stage 4 chronic kidney disease, or unspecified chronic kidney disease: Secondary | ICD-10-CM | POA: Diagnosis not present

## 2021-09-05 DIAGNOSIS — N183 Chronic kidney disease, stage 3 unspecified: Secondary | ICD-10-CM | POA: Diagnosis not present

## 2021-09-30 ENCOUNTER — Ambulatory Visit: Payer: Medicare Other

## 2022-01-27 ENCOUNTER — Other Ambulatory Visit: Payer: Self-pay | Admitting: Family Medicine

## 2022-01-27 DIAGNOSIS — Z1231 Encounter for screening mammogram for malignant neoplasm of breast: Secondary | ICD-10-CM

## 2022-01-30 ENCOUNTER — Encounter: Payer: Self-pay | Admitting: Physician Assistant

## 2022-02-06 ENCOUNTER — Ambulatory Visit
Admission: RE | Admit: 2022-02-06 | Discharge: 2022-02-06 | Disposition: A | Discharge status: Home / Self Care | Payer: Medicare PPO | Source: Ambulatory Visit | Attending: Family Medicine | Admitting: Family Medicine

## 2022-02-06 ENCOUNTER — Encounter: Payer: Self-pay | Admitting: Physician Assistant

## 2022-02-06 DIAGNOSIS — Z1231 Encounter for screening mammogram for malignant neoplasm of breast: Secondary | ICD-10-CM

## 2022-02-06 HISTORY — DX: Personal history of irradiation: Z92.3

## 2022-03-03 ENCOUNTER — Encounter: Payer: Self-pay | Admitting: *Deleted

## 2022-03-06 ENCOUNTER — Emergency Department (HOSPITAL_COMMUNITY): Payer: Medicare PPO

## 2022-03-06 ENCOUNTER — Emergency Department (HOSPITAL_COMMUNITY)
Admission: EM | Admit: 2022-03-06 | Discharge: 2022-03-06 | Disposition: A | Discharge status: Home / Self Care | Payer: Medicare PPO | Attending: Emergency Medicine | Admitting: Emergency Medicine

## 2022-03-06 ENCOUNTER — Other Ambulatory Visit: Payer: Self-pay

## 2022-03-06 DIAGNOSIS — E1122 Type 2 diabetes mellitus with diabetic chronic kidney disease: Secondary | ICD-10-CM | POA: Diagnosis not present

## 2022-03-06 DIAGNOSIS — Z20822 Contact with and (suspected) exposure to covid-19: Secondary | ICD-10-CM | POA: Insufficient documentation

## 2022-03-06 DIAGNOSIS — R471 Dysarthria and anarthria: Secondary | ICD-10-CM | POA: Insufficient documentation

## 2022-03-06 DIAGNOSIS — N189 Chronic kidney disease, unspecified: Secondary | ICD-10-CM | POA: Insufficient documentation

## 2022-03-06 DIAGNOSIS — Z7984 Long term (current) use of oral hypoglycemic drugs: Secondary | ICD-10-CM | POA: Insufficient documentation

## 2022-03-06 DIAGNOSIS — Z79899 Other long term (current) drug therapy: Secondary | ICD-10-CM | POA: Insufficient documentation

## 2022-03-06 DIAGNOSIS — I129 Hypertensive chronic kidney disease with stage 1 through stage 4 chronic kidney disease, or unspecified chronic kidney disease: Secondary | ICD-10-CM | POA: Insufficient documentation

## 2022-03-06 DIAGNOSIS — T50905A Adverse effect of unspecified drugs, medicaments and biological substances, initial encounter: Secondary | ICD-10-CM

## 2022-03-06 DIAGNOSIS — R4182 Altered mental status, unspecified: Secondary | ICD-10-CM | POA: Diagnosis present

## 2022-03-06 LAB — CBC
HCT: 43.6 % (ref 36.0–46.0)
Hemoglobin: 12.6 g/dL (ref 12.0–15.0)
MCH: 23.9 pg — ABNORMAL LOW (ref 26.0–34.0)
MCHC: 28.9 g/dL — ABNORMAL LOW (ref 30.0–36.0)
MCV: 82.7 fL (ref 80.0–100.0)
Platelets: 206 10*3/uL (ref 150–400)
RBC: 5.27 MIL/uL — ABNORMAL HIGH (ref 3.87–5.11)
RDW: 17.7 % — ABNORMAL HIGH (ref 11.5–15.5)
WBC: 7.4 10*3/uL (ref 4.0–10.5)
nRBC: 0 % (ref 0.0–0.2)

## 2022-03-06 LAB — DIFFERENTIAL
Abs Immature Granulocytes: 0.03 10*3/uL (ref 0.00–0.07)
Basophils Absolute: 0 10*3/uL (ref 0.0–0.1)
Basophils Relative: 1 %
Eosinophils Absolute: 0.4 10*3/uL (ref 0.0–0.5)
Eosinophils Relative: 5 %
Immature Granulocytes: 0 %
Lymphocytes Relative: 15 %
Lymphs Abs: 1.1 10*3/uL (ref 0.7–4.0)
Monocytes Absolute: 0.7 10*3/uL (ref 0.1–1.0)
Monocytes Relative: 10 %
Neutro Abs: 5.2 10*3/uL (ref 1.7–7.7)
Neutrophils Relative %: 69 %

## 2022-03-06 LAB — URINALYSIS, ROUTINE W REFLEX MICROSCOPIC
Bilirubin Urine: NEGATIVE
Glucose, UA: NEGATIVE mg/dL
Hgb urine dipstick: NEGATIVE
Ketones, ur: NEGATIVE mg/dL
Nitrite: NEGATIVE
Protein, ur: 30 mg/dL — AB
Specific Gravity, Urine: 1.032 — ABNORMAL HIGH (ref 1.005–1.030)
pH: 5 (ref 5.0–8.0)

## 2022-03-06 LAB — COMPREHENSIVE METABOLIC PANEL
ALT: 19 U/L (ref 0–44)
AST: 16 U/L (ref 15–41)
Albumin: 3.3 g/dL — ABNORMAL LOW (ref 3.5–5.0)
Alkaline Phosphatase: 63 U/L (ref 38–126)
Anion gap: 6 (ref 5–15)
BUN: 25 mg/dL — ABNORMAL HIGH (ref 8–23)
CO2: 25 mmol/L (ref 22–32)
Calcium: 9 mg/dL (ref 8.9–10.3)
Chloride: 111 mmol/L (ref 98–111)
Creatinine, Ser: 1.46 mg/dL — ABNORMAL HIGH (ref 0.44–1.00)
GFR, Estimated: 38 mL/min — ABNORMAL LOW (ref >60)
Glucose, Bld: 147 mg/dL — ABNORMAL HIGH (ref 70–99)
Potassium: 4.5 mmol/L (ref 3.5–5.1)
Sodium: 142 mmol/L (ref 135–145)
Total Bilirubin: 0.6 mg/dL (ref 0.3–1.2)
Total Protein: 7.2 g/dL (ref 6.5–8.1)

## 2022-03-06 LAB — I-STAT ARTERIAL BLOOD GAS, ED
Acid-base deficit: 1 mmol/L (ref 0.0–2.0)
Bicarbonate: 26.9 mmol/L (ref 20.0–28.0)
Calcium, Ion: 1.31 mmol/L (ref 1.15–1.40)
HCT: 40 % (ref 36.0–46.0)
Hemoglobin: 13.6 g/dL (ref 12.0–15.0)
O2 Saturation: 95 %
Potassium: 4.6 mmol/L (ref 3.5–5.1)
Sodium: 143 mmol/L (ref 135–145)
TCO2: 29 mmol/L (ref 22–32)
pCO2 arterial: 57.7 mmHg — ABNORMAL HIGH (ref 32–48)
pH, Arterial: 7.276 — ABNORMAL LOW (ref 7.35–7.45)
pO2, Arterial: 88 mmHg (ref 83–108)

## 2022-03-06 LAB — I-STAT CHEM 8, ED
BUN: 26 mg/dL — ABNORMAL HIGH (ref 8–23)
Calcium, Ion: 1.17 mmol/L (ref 1.15–1.40)
Chloride: 110 mmol/L (ref 98–111)
Creatinine, Ser: 1.5 mg/dL — ABNORMAL HIGH (ref 0.44–1.00)
Glucose, Bld: 152 mg/dL — ABNORMAL HIGH (ref 70–99)
HCT: 43 % (ref 36.0–46.0)
Hemoglobin: 14.6 g/dL (ref 12.0–15.0)
Potassium: 4.5 mmol/L (ref 3.5–5.1)
Sodium: 143 mmol/L (ref 135–145)
TCO2: 26 mmol/L (ref 22–32)

## 2022-03-06 LAB — RESP PANEL BY RT-PCR (FLU A&B, COVID) ARPGX2
Influenza A by PCR: NEGATIVE
Influenza B by PCR: NEGATIVE
SARS Coronavirus 2 by RT PCR: NEGATIVE

## 2022-03-06 LAB — RAPID URINE DRUG SCREEN, HOSP PERFORMED
Amphetamines: NOT DETECTED
Barbiturates: NOT DETECTED
Benzodiazepines: NOT DETECTED
Cocaine: NOT DETECTED
Opiates: NOT DETECTED
Tetrahydrocannabinol: NOT DETECTED

## 2022-03-06 LAB — APTT: aPTT: 27 seconds (ref 24–36)

## 2022-03-06 LAB — PROTIME-INR
INR: 1.1 (ref 0.8–1.2)
Prothrombin Time: 13.7 seconds (ref 11.4–15.2)

## 2022-03-06 MED ORDER — IOHEXOL 350 MG/ML SOLN
50.0000 mL | Freq: Once | INTRAVENOUS | Status: AC | PRN
  Administered 2022-03-06: 50 mL via INTRAVENOUS

## 2022-03-06 NOTE — ED Provider Notes (Signed)
Patient back to normal.  Speech is back to normal.  Husband states she is completing acting normal.  Patient ambulated without difficulty.  Most likely adverse reaction to medications.  Patient now seems to be stable for discharge home.   Fredia Sorrow, MD 03/06/22 1753

## 2022-03-06 NOTE — ED Triage Notes (Signed)
Pt BIB EMS due to ams from endo center. Pt received 150 of prop and 100 mg of lidocaine at 1252 and pt woke up confused. Pt hypertensive.

## 2022-03-06 NOTE — ED Notes (Signed)
Pt ambulated well 

## 2022-03-06 NOTE — ED Notes (Signed)
Patient transported to CT 

## 2022-03-06 NOTE — ED Provider Notes (Signed)
[facility] EMERGENCY DEPARTMENT Provider Note   CSN: [id number] Arrival date & time: 03/06/22  1346     History  Chief Complaint  Patient presents with   Altered Mental Status    [name] is a 71 y.o. female.  Level 5 caveat for altered mental status.  Patient brought in by EMS from endoscopy center with "difficulty waking up and slurred speech".  She received 150 mg of propofol at 12:52 PM and 100 mg of lidocaine at 1229.  She underwent the EGD by Dr. Michail Sermon for history of dysphagia and acid reflux disease.  This showed a hiatal hernia and gastritis. Husband at bedside reports she was normal prior to the procedure.  They reports she had difficulty waking up and now has slurred speech.  She denies any difficulty breathing or chest pain.  She denies any focal weakness, numbness or tingling.  She denies any chest pain or shortness of breath.  Her medical history includes diabetes, hypertension, CKD. There is no focal weakness to her arms or legs.  No facial droop  The history is provided by the patient, the EMS personnel and a relative. The history is limited by the condition of the patient.  Altered Mental Status      Home Medications Prior to Admission medications   Medication Sig Start Date End Date Taking? Authorizing Provider  albuterol (PROAIR HFA) 108 (90 Base) MCG/ACT inhaler Inhale 2 puffs into the lungs every 6 (six) hours as needed for wheezing or shortness of breath. 02/13/20   Olalere, Adewale A, MD  BREO ELLIPTA 200-25 MCG/INH AEPB TAKE 1 PUFF BY MOUTH EVERY DAY 01/09/21   Sherrilyn Rist A, MD  canagliflozin (INVOKANA) 300 MG TABS tablet Take 1 tablet (300 mg total) by mouth daily before breakfast. Patient not taking: Reported on 06/23/2021 03/13/21   Mina Marble P, DO  carvedilol (COREG) 25 MG tablet Take 1 tablet (25 mg total) by mouth 2 (two) times daily with a meal. 03/13/21   Mullis, Kiersten P, DO  cholecalciferol (VITAMIN D) 1000  units tablet Take 2,000 Units by mouth daily.    [provider]  Dulaglutide (TRULICITY) 4.09 WJ/1.9JY SOPN Inject 0.75 mg into the skin once a week. Patient not taking: Reported on 06/23/2021 03/13/21   Mina Marble P, DO  Fexofenadine-Pseudoephedrine (ALLEGRA-D 24 HOUR PO) Take 1 tablet by mouth daily. Patient not taking: Reported on 06/23/2021    [provider]  fluticasone (FLONASE) 50 MCG/ACT nasal spray Place 2 sprays into both nostrils daily. 04/21/21   Shary Key, DO  furosemide (LASIX) 40 MG tablet Take 1 tablet in the morning as needed for swelling. 11/18/20   Mullis, Kiersten P, DO  glucose blood (GNP TRUE METRIX GLUCOSE STRIPS) test strip Use as instructed 04/21/21   Shary Key, DO  losartan-hydrochlorothiazide (HYZAAR) 100-25 MG tablet Take 1 tablet by mouth daily. 11/18/20   Mullis, Kiersten P, DO  metFORMIN (GLUCOPHAGE-XR) 500 MG 24 hr tablet Take 1 tablet (500 mg total) by mouth at bedtime. 04/21/21   Shary Key, DO  rosuvastatin (CRESTOR) 40 MG tablet Take 1 tablet (40 mg total) by mouth daily. 03/13/21   Mullis, Kiersten P, DO  Semaglutide,0.25 or 0.'5MG'$ /DOS, (OZEMPIC, 0.25 OR 0.5 MG/DOSE,) 2 MG/1.5ML SOPN Inject 0.5 mg into the skin once a week. Patient not taking: Reported on 06/23/2021 03/13/21   Mina Marble P, DO  vitamin C (ASCORBIC ACID) 500 MG tablet Take 500 mg by mouth daily.  [provider]  Vitamin D, Ergocalciferol, (DRISDOL) 1.25 MG (50000 UNIT) CAPS capsule Take 1 capsule (50,000 Units total) by mouth every 7 (seven) days. Patient not taking: Reported on 06/23/2021 12/24/20   Starlyn Skeans, MD      Allergies    Patient has no known allergies.    Review of Systems   Review of Systems  Unable to perform ROS: Mental status change    Physical Exam Updated Vital Signs Temp 97.8 F (36.6 C) (Oral)  Physical Exam Vitals and nursing note reviewed.  Constitutional:      General: She is not in acute distress.     Appearance: She is well-developed. She is obese.     Comments: Dysarthric speech but oriented x3  HENT:     Head: Normocephalic and atraumatic.     Mouth/Throat:     Pharynx: No oropharyngeal exudate.  Eyes:     Conjunctiva/sclera: Conjunctivae normal.     Pupils: Pupils are equal, round, and reactive to light.  Neck:     Comments: No meningismus. Cardiovascular:     Rate and Rhythm: Normal rate and regular rhythm.     Heart sounds: Normal heart sounds. No murmur heard. Pulmonary:     Effort: Pulmonary effort is normal. No respiratory distress.     Breath sounds: Normal breath sounds.  Abdominal:     Palpations: Abdomen is soft.     Tenderness: There is no abdominal tenderness. There is no guarding or rebound.  Musculoskeletal:        General: No tenderness. Normal range of motion.     Cervical back: Normal range of motion and neck supple.  Skin:    General: Skin is warm.  Neurological:     Mental Status: She is alert and oriented to person, place, and time.     Cranial Nerves: No cranial nerve deficit.     Motor: No abnormal muscle tone.     Coordination: Coordination normal.     Comments: CN 2-12 intact, no ataxia on finger to nose, no nystagmus, 5/5 strength throughout, no pronator drift,  Dysarthric speech Visual fields full to confrontation.  Psychiatric:        Behavior: Behavior normal.     ED Results / Procedures / Treatments   Labs (all labs ordered are listed, but only abnormal results are displayed) Labs Reviewed  CBC - Abnormal; Notable for the following components:      Result Value   RBC 5.27 (*)    MCH 23.9 (*)    MCHC 28.9 (*)    RDW 17.7 (*)    All other components within normal limits  COMPREHENSIVE METABOLIC PANEL - Abnormal; Notable for the following components:   Glucose, Bld 147 (*)    BUN 25 (*)    Creatinine, Ser 1.46 (*)    Albumin 3.3 (*)    GFR, Estimated 38 (*)    All other components within normal limits  I-STAT CHEM 8, ED -  Abnormal; Notable for the following components:   BUN 26 (*)    Creatinine, Ser 1.50 (*)    Glucose, Bld 152 (*)    All other components within normal limits  I-STAT ARTERIAL BLOOD GAS, ED - Abnormal; Notable for the following components:   pH, Arterial 7.276 (*)    pCO2 arterial 57.7 (*)    All other components within normal limits  RESP PANEL BY RT-PCR (FLU A&B, COVID) ARPGX2  PROTIME-INR  APTT  DIFFERENTIAL  ETHANOL  RAPID URINE DRUG SCREEN, HOSP PERFORMED  URINALYSIS, ROUTINE W REFLEX MICROSCOPIC  COOXEMETRY PANEL    EKG EKG Interpretation  Date/Time:  Friday March 06 2022 13:56:57 EDT Ventricular Rate:  83 PR Interval:  178 QRS Duration: 91 QT Interval:  378 QTC Calculation: 445 R Axis:   -59 Text Interpretation: Sinus rhythm Probable left atrial enlargement Left anterior fascicular block Abnormal R-wave progression, late transition No significant change was found Confirmed by Ezequiel Essex [id number]) on 03/06/2022 2:35:39 PM  Radiology CT ANGIO HEAD NECK W WO CM  Result Date: 03/06/2022 CLINICAL DATA:  Weakness, stroke-like symptoms EXAM: CT ANGIOGRAPHY HEAD AND NECK TECHNIQUE: Multidetector CT imaging of the head and neck was performed using the standard protocol during bolus administration of intravenous contrast. Multiplanar CT image reconstructions and MIPs were obtained to evaluate the vascular anatomy. Carotid stenosis measurements (when applicable) are obtained utilizing NASCET criteria, using the distal internal carotid diameter as the denominator. RADIATION DOSE REDUCTION: This exam was performed according to the departmental dose-optimization program which includes automated exposure control, adjustment of the mA and/or kV according to patient size and/or use of iterative reconstruction technique. CONTRAST:  90m OMNIPAQUE IOHEXOL 350 MG/ML SOLN COMPARISON:  No prior CTA, correlation is made with CT head 03/06/2022 FINDINGS: CT HEAD FINDINGS For noncontrast findings,  please see same day CT head. CTA NECK FINDINGS Aortic arch: Two-vessel arch with a common origin of the brachiocephalic and left common carotid arteries. Imaged portion shows no evidence of aneurysm or dissection. No significant stenosis of the major arch vessel origins. Right carotid system: No evidence of dissection, occlusion, or hemodynamically significant stenosis (greater than 50%). Atherosclerotic disease at the bifurcation and in the proximal ICA is not hemodynamically significant. Retropharyngeal course of the right ICA. Left carotid system: No evidence of dissection, occlusion, or hemodynamically significant stenosis (greater than 50%). Retropharyngeal course of the left common carotid artery. Vertebral arteries: No evidence of dissection, occlusion, or hemodynamically significant stenosis (greater than 50%). Skeleton: No acute osseous abnormality. Degenerative changes in the cervical spine with significant disc height loss at C3-C4 and, to a lesser extent, C5-C7. No acute osseous abnormality. Other neck: No acute finding. Upper chest: Dependent atelectasis. No focal pulmonary opacity or pleural effusion. Review of the MIP images confirms the above findings CTA HEAD FINDINGS Anterior circulation: Both internal carotid arteries are patent to the termini, without significant stenosis. A1 segments patent. Normal anterior communicating artery. Anterior cerebral arteries are patent to their distal aspects. No M1 stenosis or occlusion. Normal MCA bifurcations. Distal MCA branches perfused and symmetric. Posterior circulation: Vertebral arteries patent to the vertebrobasilar junction without stenosis. Posterior inferior cerebellar arteries patent proximally. Basilar patent to its distal aspect. Superior cerebellar arteries patent proximally. Patent P1 segments. PCAs perfused to their distal aspects without stenosis. The bilateral posterior communicating arteries are patent. Venous sinuses: As permitted by  contrast timing, patent. Anatomic variants: None significant. Review of the MIP images confirms the above findings IMPRESSION: 1.  No intracranial large vessel occlusion or significant stenosis. 2.  No hemodynamically significant stenosis in the neck. Electronically Signed   By: AMerilyn BabaM.D.   On: 03/06/2022 15:40   CT HEAD WO CONTRAST (5MM)  Result Date: 03/06/2022 CLINICAL DATA:  Altered mental status EXAM: CT HEAD WITHOUT CONTRAST TECHNIQUE: Contiguous axial images were obtained from the base of the skull through the vertex without intravenous contrast. RADIATION DOSE REDUCTION: This exam was performed according to the departmental dose-optimization program which includes automated exposure control, adjustment  of the mA and/or kV according to patient size and/or use of iterative reconstruction technique. COMPARISON:  None Available. FINDINGS: Brain: No evidence of acute infarction, hemorrhage, hydrocephalus, extra-axial collection or mass lesion/mass effect. Vascular: No hyperdense vessel or unexpected calcification. Skull: Normal. Negative for fracture or focal lesion. Sinuses/Orbits: No acute finding. Other: None. IMPRESSION: No acute intracranial pathology. Electronically Signed   By: Delanna Ahmadi M.D.   On: 03/06/2022 15:23   DG Chest Portable 1 View  Result Date: 03/06/2022 CLINICAL DATA:  Altered mental status EXAM: PORTABLE CHEST 1 VIEW COMPARISON:  09/06/2019 FINDINGS: Elevated right diaphragm unchanged from the prior exam. No focal consolidation. No pleural effusion or pneumothorax. Stable cardiomegaly. No acute osseous abnormality. IMPRESSION: No active disease. Electronically Signed   By: Kathreen Devoid M.D.   On: 03/06/2022 14:31    Procedures Procedures    Medications Ordered in ED Medications - No data to display  ED Course/ Medical Decision Making/ A&P                           Medical Decision Making Amount and/or Complexity of Data Reviewed Independent Historian: spouse  and EMS Labs: ordered. Decision-making details documented in ED Course. Radiology: ordered and independent interpretation performed. Decision-making details documented in ED Course. ECG/medicine tests: ordered and independent interpretation performed. Decision-making details documented in ED Course.  Risk Prescription drug management.  Decreased mentation, dysarthria, altered mental status after receiving sedation for endoscopy earlier today.  No hypoxia or increased work of breathing.  Nonfocal neurological exam other than dysarthria.  Discussed with Dr. Leonel Ramsay of neurology who agrees likely medication effect and will not activate code stroke at this time  CT head is negative for hemorrhage.  Results reviewed and interpreted by me.  No evidence of large vessel occlusion.  Patient speech is improving.  Suspect this is likely side effect from medications  ABG shows a mixed acidosis. Patient was taken off oxygen and is able to breathe normally without any hypoxia.  Her labs are otherwise reassuring.  CTA shows no large vessel occlusion.  We will allow her to continue to metabolize the medications and likely anticipate discharge home when she is back to baseline, tolerating p.o. and ambulatory. Dr. Rogene Houston to assume care at shift change        Final Clinical Impression(s) / ED Diagnoses Final diagnoses:  None    Rx / DC Orders ED Discharge Orders     None         Keigan Girten, Annie Main, MD 03/06/22 1710

## 2022-03-06 NOTE — Discharge Instructions (Signed)
We suspect your slurred speech was due to the medications that you received for endoscopy.  Your blood work is reassuring and your CT scan is negative for stroke or other acute problem.  Follow-up with your primary doctor.  Return to the ED with new or worsening symptoms

## 2022-03-10 ENCOUNTER — Other Ambulatory Visit: Payer: Self-pay | Admitting: Gastroenterology

## 2022-03-10 DIAGNOSIS — K449 Diaphragmatic hernia without obstruction or gangrene: Secondary | ICD-10-CM

## 2022-04-06 ENCOUNTER — Ambulatory Visit
Admission: RE | Admit: 2022-04-06 | Discharge: 2022-04-06 | Disposition: A | Discharge status: Home / Self Care | Payer: Medicare PPO | Source: Ambulatory Visit | Attending: Gastroenterology | Admitting: Gastroenterology

## 2022-04-06 DIAGNOSIS — K449 Diaphragmatic hernia without obstruction or gangrene: Secondary | ICD-10-CM

## 2022-04-30 ENCOUNTER — Other Ambulatory Visit: Payer: Self-pay

## 2022-04-30 DIAGNOSIS — E119 Type 2 diabetes mellitus without complications: Secondary | ICD-10-CM

## 2022-04-30 DIAGNOSIS — I10 Essential (primary) hypertension: Secondary | ICD-10-CM

## 2022-04-30 MED ORDER — CARVEDILOL 25 MG PO TABS: 25.0000 mg | ORAL_TABLET | Freq: Two times a day (BID) | ORAL | 3 refills | Status: DC

## 2022-05-06 ENCOUNTER — Encounter (INDEPENDENT_AMBULATORY_CARE_PROVIDER_SITE_OTHER): Payer: Self-pay

## 2022-05-13 ENCOUNTER — Other Ambulatory Visit: Payer: Self-pay | Admitting: Gastroenterology

## 2022-05-13 ENCOUNTER — Other Ambulatory Visit (HOSPITAL_COMMUNITY): Payer: Self-pay | Admitting: Gastroenterology

## 2022-05-13 DIAGNOSIS — R933 Abnormal findings on diagnostic imaging of other parts of digestive tract: Secondary | ICD-10-CM

## 2022-05-13 DIAGNOSIS — R1112 Projectile vomiting: Secondary | ICD-10-CM

## 2022-05-14 LAB — COLOGUARD: COLOGUARD: NEGATIVE

## 2022-05-19 ENCOUNTER — Encounter: Payer: Self-pay | Admitting: Physician Assistant

## 2022-05-20 ENCOUNTER — Encounter (HOSPITAL_COMMUNITY): Payer: Self-pay

## 2022-05-20 ENCOUNTER — Ambulatory Visit (HOSPITAL_COMMUNITY)
Admission: RE | Admit: 2022-05-20 | Discharge: 2022-05-20 | Disposition: A | Discharge status: Home / Self Care | Payer: Medicare PPO | Source: Ambulatory Visit | Attending: Gastroenterology | Admitting: Gastroenterology

## 2022-05-20 ENCOUNTER — Other Ambulatory Visit: Payer: Self-pay

## 2022-05-20 ENCOUNTER — Emergency Department (HOSPITAL_COMMUNITY): Payer: Medicare PPO

## 2022-05-20 ENCOUNTER — Inpatient Hospital Stay (HOSPITAL_COMMUNITY)
Admission: EM | Admit: 2022-05-20 | Discharge: 2022-06-02 | DRG: 981 | Disposition: A | Discharge status: Home Health | Payer: Medicare PPO | Attending: Internal Medicine | Admitting: Internal Medicine

## 2022-05-20 DIAGNOSIS — K562 Volvulus: Secondary | ICD-10-CM | POA: Diagnosis present

## 2022-05-20 DIAGNOSIS — N183 Chronic kidney disease, stage 3 unspecified: Secondary | ICD-10-CM | POA: Insufficient documentation

## 2022-05-20 DIAGNOSIS — R1112 Projectile vomiting: Secondary | ICD-10-CM | POA: Insufficient documentation

## 2022-05-20 DIAGNOSIS — Z9889 Other specified postprocedural states: Secondary | ICD-10-CM

## 2022-05-20 DIAGNOSIS — K440 Diaphragmatic hernia with obstruction, without gangrene: Secondary | ICD-10-CM

## 2022-05-20 DIAGNOSIS — Z96651 Presence of right artificial knee joint: Secondary | ICD-10-CM | POA: Diagnosis present

## 2022-05-20 DIAGNOSIS — Z170 Estrogen receptor positive status [ER+]: Secondary | ICD-10-CM

## 2022-05-20 DIAGNOSIS — I10 Essential (primary) hypertension: Secondary | ICD-10-CM | POA: Diagnosis present

## 2022-05-20 DIAGNOSIS — J4520 Mild intermittent asthma, uncomplicated: Secondary | ICD-10-CM | POA: Diagnosis not present

## 2022-05-20 DIAGNOSIS — N179 Acute kidney failure, unspecified: Principal | ICD-10-CM

## 2022-05-20 DIAGNOSIS — Z20822 Contact with and (suspected) exposure to covid-19: Secondary | ICD-10-CM | POA: Diagnosis present

## 2022-05-20 DIAGNOSIS — E8770 Fluid overload, unspecified: Secondary | ICD-10-CM | POA: Diagnosis not present

## 2022-05-20 DIAGNOSIS — E119 Type 2 diabetes mellitus without complications: Secondary | ICD-10-CM | POA: Insufficient documentation

## 2022-05-20 DIAGNOSIS — N1831 Chronic kidney disease, stage 3a: Secondary | ICD-10-CM

## 2022-05-20 DIAGNOSIS — Z833 Family history of diabetes mellitus: Secondary | ICD-10-CM

## 2022-05-20 DIAGNOSIS — N170 Acute kidney failure with tubular necrosis: Secondary | ICD-10-CM | POA: Diagnosis not present

## 2022-05-20 DIAGNOSIS — R0902 Hypoxemia: Secondary | ICD-10-CM | POA: Diagnosis present

## 2022-05-20 DIAGNOSIS — Z853 Personal history of malignant neoplasm of breast: Secondary | ICD-10-CM

## 2022-05-20 DIAGNOSIS — Z884 Allergy status to anesthetic agent status: Secondary | ICD-10-CM

## 2022-05-20 DIAGNOSIS — R933 Abnormal findings on diagnostic imaging of other parts of digestive tract: Secondary | ICD-10-CM | POA: Insufficient documentation

## 2022-05-20 DIAGNOSIS — Z7951 Long term (current) use of inhaled steroids: Secondary | ICD-10-CM

## 2022-05-20 DIAGNOSIS — N189 Chronic kidney disease, unspecified: Secondary | ICD-10-CM | POA: Diagnosis present

## 2022-05-20 DIAGNOSIS — I742 Embolism and thrombosis of arteries of the upper extremities: Secondary | ICD-10-CM | POA: Diagnosis not present

## 2022-05-20 DIAGNOSIS — E785 Hyperlipidemia, unspecified: Secondary | ICD-10-CM

## 2022-05-20 DIAGNOSIS — Z9289 Personal history of other medical treatment: Secondary | ICD-10-CM | POA: Insufficient documentation

## 2022-05-20 DIAGNOSIS — Z807 Family history of other malignant neoplasms of lymphoid, hematopoietic and related tissues: Secondary | ICD-10-CM

## 2022-05-20 DIAGNOSIS — E7800 Pure hypercholesterolemia, unspecified: Secondary | ICD-10-CM | POA: Diagnosis present

## 2022-05-20 DIAGNOSIS — R1900 Intra-abdominal and pelvic swelling, mass and lump, unspecified site: Secondary | ICD-10-CM | POA: Diagnosis not present

## 2022-05-20 DIAGNOSIS — Z7985 Long-term (current) use of injectable non-insulin antidiabetic drugs: Secondary | ICD-10-CM

## 2022-05-20 DIAGNOSIS — E1122 Type 2 diabetes mellitus with diabetic chronic kidney disease: Secondary | ICD-10-CM | POA: Diagnosis present

## 2022-05-20 DIAGNOSIS — K219 Gastro-esophageal reflux disease without esophagitis: Secondary | ICD-10-CM | POA: Diagnosis present

## 2022-05-20 DIAGNOSIS — Z6841 Body Mass Index (BMI) 40.0 and over, adult: Secondary | ICD-10-CM

## 2022-05-20 DIAGNOSIS — E1169 Type 2 diabetes mellitus with other specified complication: Secondary | ICD-10-CM | POA: Diagnosis present

## 2022-05-20 DIAGNOSIS — K3189 Other diseases of stomach and duodenum: Secondary | ICD-10-CM

## 2022-05-20 DIAGNOSIS — E66813 Obesity, class 3: Secondary | ICD-10-CM

## 2022-05-20 DIAGNOSIS — M1990 Unspecified osteoarthritis, unspecified site: Secondary | ICD-10-CM | POA: Diagnosis present

## 2022-05-20 DIAGNOSIS — Z8249 Family history of ischemic heart disease and other diseases of the circulatory system: Secondary | ICD-10-CM

## 2022-05-20 DIAGNOSIS — N1832 Chronic kidney disease, stage 3b: Secondary | ICD-10-CM | POA: Diagnosis present

## 2022-05-20 DIAGNOSIS — G4733 Obstructive sleep apnea (adult) (pediatric): Secondary | ICD-10-CM | POA: Diagnosis present

## 2022-05-20 DIAGNOSIS — I129 Hypertensive chronic kidney disease with stage 1 through stage 4 chronic kidney disease, or unspecified chronic kidney disease: Secondary | ICD-10-CM | POA: Diagnosis present

## 2022-05-20 DIAGNOSIS — Z803 Family history of malignant neoplasm of breast: Secondary | ICD-10-CM

## 2022-05-20 DIAGNOSIS — E871 Hypo-osmolality and hyponatremia: Secondary | ICD-10-CM | POA: Diagnosis not present

## 2022-05-20 DIAGNOSIS — E1159 Type 2 diabetes mellitus with other circulatory complications: Secondary | ICD-10-CM | POA: Diagnosis present

## 2022-05-20 DIAGNOSIS — E860 Dehydration: Secondary | ICD-10-CM | POA: Diagnosis present

## 2022-05-20 DIAGNOSIS — Z7984 Long term (current) use of oral hypoglycemic drugs: Secondary | ICD-10-CM

## 2022-05-20 DIAGNOSIS — E11649 Type 2 diabetes mellitus with hypoglycemia without coma: Secondary | ICD-10-CM | POA: Diagnosis present

## 2022-05-20 DIAGNOSIS — Z79899 Other long term (current) drug therapy: Secondary | ICD-10-CM

## 2022-05-20 DIAGNOSIS — I152 Hypertension secondary to endocrine disorders: Secondary | ICD-10-CM | POA: Diagnosis present

## 2022-05-20 DIAGNOSIS — Z923 Personal history of irradiation: Secondary | ICD-10-CM

## 2022-05-20 LAB — LIPASE, BLOOD: Lipase: 44 U/L (ref 11–51)

## 2022-05-20 LAB — POCT I-STAT CREATININE: Creatinine, Ser: 4.1 mg/dL — ABNORMAL HIGH (ref 0.44–1.00)

## 2022-05-20 LAB — CBC WITH DIFFERENTIAL/PLATELET
Abs Immature Granulocytes: 0.01 10*3/uL (ref 0.00–0.07)
Basophils Absolute: 0 10*3/uL (ref 0.0–0.1)
Basophils Relative: 1 %
Eosinophils Absolute: 0.2 10*3/uL (ref 0.0–0.5)
Eosinophils Relative: 4 %
HCT: 43.6 % (ref 36.0–46.0)
Hemoglobin: 12.7 g/dL (ref 12.0–15.0)
Immature Granulocytes: 0 %
Lymphocytes Relative: 14 %
Lymphs Abs: 0.8 10*3/uL (ref 0.7–4.0)
MCH: 24.2 pg — ABNORMAL LOW (ref 26.0–34.0)
MCHC: 29.1 g/dL — ABNORMAL LOW (ref 30.0–36.0)
MCV: 83 fL (ref 80.0–100.0)
Monocytes Absolute: 0.5 10*3/uL (ref 0.1–1.0)
Monocytes Relative: 8 %
Neutro Abs: 4.3 10*3/uL (ref 1.7–7.7)
Neutrophils Relative %: 73 %
Platelets: 208 10*3/uL (ref 150–400)
RBC: 5.25 MIL/uL — ABNORMAL HIGH (ref 3.87–5.11)
RDW: 15.5 % (ref 11.5–15.5)
WBC: 5.8 10*3/uL (ref 4.0–10.5)
nRBC: 0 % (ref 0.0–0.2)

## 2022-05-20 LAB — COMPREHENSIVE METABOLIC PANEL
ALT: 15 U/L (ref 0–44)
AST: 14 U/L — ABNORMAL LOW (ref 15–41)
Albumin: 3.6 g/dL (ref 3.5–5.0)
Alkaline Phosphatase: 61 U/L (ref 38–126)
Anion gap: 10 (ref 5–15)
BUN: 54 mg/dL — ABNORMAL HIGH (ref 8–23)
CO2: 31 mmol/L (ref 22–32)
Calcium: 10.1 mg/dL (ref 8.9–10.3)
Chloride: 100 mmol/L (ref 98–111)
Creatinine, Ser: 3.73 mg/dL — ABNORMAL HIGH (ref 0.44–1.00)
GFR, Estimated: 12 mL/min — ABNORMAL LOW (ref >60)
Glucose, Bld: 109 mg/dL — ABNORMAL HIGH (ref 70–99)
Potassium: 4 mmol/L (ref 3.5–5.1)
Sodium: 141 mmol/L (ref 135–145)
Total Bilirubin: 0.4 mg/dL (ref 0.3–1.2)
Total Protein: 7.4 g/dL (ref 6.5–8.1)

## 2022-05-20 LAB — URINALYSIS, ROUTINE W REFLEX MICROSCOPIC
Bilirubin Urine: NEGATIVE
Glucose, UA: >=500 mg/dL — AB
Ketones, ur: NEGATIVE mg/dL
Leukocytes,Ua: NEGATIVE
Nitrite: NEGATIVE
Protein, ur: NEGATIVE mg/dL
Specific Gravity, Urine: 1.011 (ref 1.005–1.030)
pH: 5 (ref 5.0–8.0)

## 2022-05-20 LAB — CBG MONITORING, ED: Glucose-Capillary: 80 mg/dL (ref 70–99)

## 2022-05-20 LAB — SARS CORONAVIRUS 2 BY RT PCR: SARS Coronavirus 2 by RT PCR: NEGATIVE

## 2022-05-20 MED ORDER — ONDANSETRON HCL 4 MG PO TABS: 4.0000 mg | ORAL_TABLET | Freq: Four times a day (QID) | ORAL | Status: DC | PRN

## 2022-05-20 MED ORDER — PANTOPRAZOLE SODIUM 40 MG IV SOLR
40.0000 mg | Freq: Once | INTRAVENOUS | Status: AC
Start: 2022-05-20 — End: 2022-05-20
  Administered 2022-05-20: 40 mg via INTRAVENOUS
  Filled 2022-05-20: qty 10

## 2022-05-20 MED ORDER — CARVEDILOL 25 MG PO TABS
25.0000 mg | ORAL_TABLET | Freq: Two times a day (BID) | ORAL | Status: DC
  Administered 2022-05-21 – 2022-05-22 (×3): 25 mg via ORAL
  Filled 2022-05-20: qty 2
  Filled 2022-05-20 (×3): qty 1

## 2022-05-20 MED ORDER — ONDANSETRON HCL 4 MG/2ML IJ SOLN
4.0000 mg | Freq: Once | INTRAMUSCULAR | Status: AC
Start: 2022-05-20 — End: 2022-05-20
  Administered 2022-05-20: 4 mg via INTRAVENOUS
  Filled 2022-05-20: qty 2

## 2022-05-20 MED ORDER — SODIUM CHLORIDE 0.9 % IV BOLUS
1000.0000 mL | Freq: Once | INTRAVENOUS | Status: AC
Start: 2022-05-20 — End: 2022-05-20
  Administered 2022-05-20: 1000 mL via INTRAVENOUS

## 2022-05-20 MED ORDER — ACETAMINOPHEN 650 MG RE SUPP: 650.0000 mg | Freq: Four times a day (QID) | RECTAL | Status: DC | PRN

## 2022-05-20 MED ORDER — ONDANSETRON HCL 4 MG/2ML IJ SOLN
4.0000 mg | Freq: Four times a day (QID) | INTRAMUSCULAR | Status: DC | PRN
  Administered 2022-05-21 – 2022-05-22 (×2): 4 mg via INTRAVENOUS
  Filled 2022-05-20 (×4): qty 2

## 2022-05-20 MED ORDER — SODIUM CHLORIDE 0.9% FLUSH
3.0000 mL | Freq: Two times a day (BID) | INTRAVENOUS | Status: DC
  Administered 2022-05-20 – 2022-05-24 (×7): 3 mL via INTRAVENOUS

## 2022-05-20 MED ORDER — ALBUTEROL SULFATE (2.5 MG/3ML) 0.083% IN NEBU: 3.0000 mL | INHALATION_SOLUTION | Freq: Four times a day (QID) | RESPIRATORY_TRACT | Status: DC | PRN

## 2022-05-20 MED ORDER — SODIUM CHLORIDE 0.9 % IV SOLN: INTRAVENOUS | Status: DC

## 2022-05-20 MED ORDER — IOHEXOL 9 MG/ML PO SOLN
500.0000 mL | Freq: Once | ORAL | Status: AC
  Administered 2022-05-20: 500 mL via ORAL

## 2022-05-20 MED ORDER — PANTOPRAZOLE SODIUM 40 MG PO TBEC
40.0000 mg | DELAYED_RELEASE_TABLET | Freq: Every day | ORAL | Status: DC
  Administered 2022-05-21 – 2022-05-22 (×2): 40 mg via ORAL
  Filled 2022-05-20 (×3): qty 1

## 2022-05-20 MED ORDER — INSULIN ASPART 100 UNIT/ML IJ SOLN
0.0000 [IU] | Freq: Three times a day (TID) | INTRAMUSCULAR | Status: DC
  Administered 2022-05-23: 2 [IU] via SUBCUTANEOUS
  Filled 2022-05-20: qty 0.15

## 2022-05-20 MED ORDER — ACETAMINOPHEN 325 MG PO TABS: 650.0000 mg | ORAL_TABLET | Freq: Four times a day (QID) | ORAL | Status: DC | PRN

## 2022-05-20 MED ORDER — SODIUM CHLORIDE (PF) 0.9 % IJ SOLN
INTRAMUSCULAR | Status: AC
  Filled 2022-05-20: qty 50

## 2022-05-20 MED ORDER — POLYETHYLENE GLYCOL 3350 17 G PO PACK: 17.0000 g | PACK | Freq: Every day | ORAL | Status: DC | PRN

## 2022-05-20 MED ORDER — ENOXAPARIN SODIUM 30 MG/0.3ML IJ SOSY
30.0000 mg | PREFILLED_SYRINGE | INTRAMUSCULAR | Status: DC
  Administered 2022-05-20 – 2022-05-24 (×5): 30 mg via SUBCUTANEOUS
  Filled 2022-05-20 (×5): qty 0.3

## 2022-05-20 MED ORDER — IOHEXOL 300 MG/ML  SOLN
100.0000 mL | Freq: Once | INTRAMUSCULAR | Status: AC | PRN
  Administered 2022-05-20: 100 mL via INTRAVENOUS

## 2022-05-20 NOTE — H&P (Signed)
History and Physical   [name]:[id number] DOB: May 25, 1951 DOA: 05/20/2022  PCP: Shary Key, DO   Patient coming from: Home  Chief Complaint: Abnormal lab value  HPI: [name] is a 71 y.o. female with medical history significant of obesity, anemia, hypertension, diabetes, CKD 3A, asthma, hyperlipidemia, breast cancer, GERD, OSA presenting with abnormal lab value.  Patient was in the process of being worked up for ongoing nausea and vomiting for the past week.  She was scheduled to have an outpatient CT performed but her labs came back showing an elevated creatinine and she was sent to the ED for further evaluation.  She reports productive cough in addition to her nausea vomiting for the last week.  Has had decreased p.o. intake and has continued to take antihypertensives.  She denies fevers, chills, chest pain, shortness of breath, abdominal pain, constipation, diarrhea.  ED Course: Vital signs in the ED significant for blood pressure in the 332R to 518A systolic.  Lab work-up included CMP with BUN elevated to 54 and creatinine elevated to 3.73 from baseline of 1.5.  Glucose 109.  CBC within normal limits.  Lipase normal.  COVID screening negative.  Urinalysis pending.  Chest x-ray showing changes consistent with atelectasis.  CT of the abdomen pelvis showed complex encapsulated partial wall calcified cystic mass of the right mid abdomen with some other calcified areas noted with a size of 12 x 15 x 15.7 cm most consistent with a teratoma.  Also noted was a large hiatal hernia with some wall thickening, questionable colonic wall thickening, fibroid uterus, gallstones, enlarged pancreatic head with recommendation for pancreatic protocol CT or MRI, nodular density of the left breast recommending mammogram or ultrasound follow-up, and thoracic/lumbar spondylosis consistent with degenerative disease.  Patient received Zofran, PPI, liter fluids in the ED and general surgery was  consulted who recommended outpatient follow-up most likely for this mass however they will see the patient in consultation tomorrow.  Review of Systems: As per HPI otherwise all other systems reviewed and are negative.  Past Medical History:  Diagnosis Date   Abnormal electrocardiogram (ECG) (EKG)    Anemia    Arthritis    Asthma    Back pain    Bilateral swelling of feet    Blood transfusion 03/14/2012   Bradycardia    Breast cancer ([place]) 03/16/2012   right lumpectomy=high grade ductal ca in situ,ER/PR=positive   Cancer ([place])    right breast ductal carcinoma in situ   Chronic kidney disease    stage III    Diabetes mellitus    type 2   Dyspnea    Edema    GERD (gastroesophageal reflux disease)    Hiatal hernia    large   Hypercholesterolemia    Hypertension    Echocardiogram 03/2011: EF 41-66%, grade 1 diastolic dysfunction, mild MAC, mild LAE   Iron deficiency anemia    Joint pain    Morbid obesity (HCC)    Other fatigue    Personal history of radiation therapy    S/P radiation therapy 05/16/12 - 06/29/12   Right Breast: 50 Gray/ 25 Fractions with Boost: 10 Gray/ 5 Fractions   Shortness of breath    with exertion   Shortness of breath on exertion    Sinus drainage    Sleep apnea    Total knee replacement status 05/15/2014   Use of anastrozole (Arimidex) Started 10/13   Vitamin D deficiency     Past Surgical History:  Procedure Laterality Date   Biospy Right Breast  02/01/12   Right Breast Needle Core Biopsy: ductal Carcinoma In situ with Papillary Features, ER/PR Positive   BREAST EXCISIONAL BIOPSY     left 1998   BREAST LUMPECTOMY  04/1997   Lumpectomy Right Breast  03/16/12   Ductal carcinoma In situ: clear margins: 0/1 Node Negative   TOTAL KNEE ARTHROPLASTY Right 05/15/2014   Procedure: RIGHT TOTAL KNEE ARTHROPLASTY;  Surgeon: Tobi Bastos, MD;  Location: [facility];  Service: Orthopedics;  Laterality: Right;    Social History  reports that she has never  smoked. She has never used smokeless tobacco. She reports that she does not drink alcohol and does not use drugs.  Allergies  Allergen Reactions   Diprivan [Propofol]     Oxygen Levels Lowered    Family History  Problem Relation Age of Onset   Hypertension Mother    Diabetes Mother    Heart attack Father 32   Heart disease Father    Sudden death Father    Breast cancer Sister 32   Lymphoma Sister        Deceased at age 19  Reviewed on admission  Prior to Admission medications   Medication Sig Start Date End Date Taking? Authorizing Provider  carvedilol (COREG) 25 MG tablet Take 1 tablet (25 mg total) by mouth 2 (two) times daily with a meal. 04/30/22  Yes Paige, Victoria J, DO  chlorthalidone (HYGROTON) 25 MG tablet Take 25 mg by mouth daily. 01/01/22  Yes [provider]  cholecalciferol (VITAMIN D) 1000 units tablet Take 1,000 Units by mouth daily.   Yes [provider]  Cholecalciferol (VITAMIN D) 50 MCG (2000 UT) tablet Take 2,000 Units by mouth daily.   Yes [provider]  JARDIANCE 10 MG TABS tablet Take 10 mg by mouth daily. 04/28/22  Yes [provider]  KERENDIA 20 MG TABS Take 1 tablet by mouth daily. 04/28/22  Yes [provider]  losartan (COZAAR) 100 MG tablet Take 100 mg by mouth daily. 01/01/22  Yes [provider]  omeprazole (PRILOSEC) 40 MG capsule Take 40 mg by mouth 2 (two) times daily. 03/09/22  Yes [provider]  ondansetron (ZOFRAN) 4 MG tablet Take 4 mg by mouth 3 (three) times daily as needed for nausea or vomiting. 05/13/22  Yes [provider]  albuterol (PROAIR HFA) 108 (90 Base) MCG/ACT inhaler Inhale 2 puffs into the lungs every 6 (six) hours as needed for wheezing or shortness of breath. Patient not taking: Reported on 05/20/2022 02/13/20   Laurin Coder, MD  BREO ELLIPTA 200-25 MCG/INH AEPB TAKE 1 PUFF BY MOUTH EVERY DAY Patient not taking: Reported on 05/20/2022 01/09/21   Laurin Coder, MD  glucose blood (GNP TRUE METRIX GLUCOSE STRIPS) test strip Use as instructed 04/21/21   Shary Key, DO  Vitamin D, Ergocalciferol, (DRISDOL) 1.25 MG (50000 UNIT) CAPS capsule Take 1 capsule (50,000 Units total) by mouth every 7 (seven) days. Patient not taking: Reported on 06/23/2021 12/24/20   Dennard Nip D, MD    Physical Exam: Vitals:   05/20/22 1621 05/20/22 1750 05/20/22 1830  BP: (!) 192/102 (!) 162/90 (!) 154/88  Pulse: 80 74 78  Resp: _0 Temp: 97.7 F (36.5 C)    TempSrc: Oral    SpO2: (!) 89% 90% 96%  Weight: 99.6 kg    Height: _1  (1.575 m)      Physical Exam Constitutional:  General: She is not in acute distress.    Appearance: Normal appearance. She is obese.  HENT:     Head: Normocephalic and atraumatic.     Mouth/Throat:     Mouth: Mucous membranes are moist.     Pharynx: Oropharynx is clear.  Eyes:     Extraocular Movements: Extraocular movements intact.     Pupils: Pupils are equal, round, and reactive to light.  Cardiovascular:     Rate and Rhythm: Normal rate and regular rhythm.     Pulses: Normal pulses.     Heart sounds: Normal heart sounds.  Pulmonary:     Effort: Pulmonary effort is normal. No respiratory distress.     Breath sounds: Normal breath sounds.  Abdominal:     General: Bowel sounds are normal. There is no distension.     Palpations: Abdomen is soft.     Tenderness: There is no abdominal tenderness.  Musculoskeletal:        General: No swelling or deformity.  Skin:    General: Skin is warm and dry.  Neurological:     General: No focal deficit present.     Mental Status: Mental status is at baseline.    Labs on Admission: I have personally reviewed following labs and imaging studies  CBC: Recent Labs  Lab 05/20/22 1659  WBC 5.8  NEUTROABS 4.3  HGB 12.7  HCT 43.6  MCV 83.0  PLT 169    Basic Metabolic Panel: Recent Labs  Lab 05/20/22 1510 05/20/22 1659  NA  --  141  K  --  4.0  CL   --  100  CO2  --  31  GLUCOSE  --  109*  BUN  --  54*  CREATININE 4.10* 3.73*  CALCIUM  --  10.1    GFR: Estimated Creatinine Clearance: 14.8 mL/min (A) (by C-G formula based on SCr of 3.73 mg/dL (H)).  Liver Function Tests: Recent Labs  Lab 05/20/22 1659  AST 14*  ALT 15  ALKPHOS 61  BILITOT 0.4  PROT 7.4  ALBUMIN 3.6    Urine analysis:    Component Value Date/Time   COLORURINE YELLOW 03/06/2022 1415   APPEARANCEUR HAZY (A) 03/06/2022 1415   LABSPEC 1.032 (H) 03/06/2022 1415   PHURINE 5.0 03/06/2022 1415   GLUCOSEU NEGATIVE 03/06/2022 1415   HGBUR NEGATIVE 03/06/2022 1415   BILIRUBINUR NEGATIVE 03/06/2022 1415   KETONESUR NEGATIVE 03/06/2022 1415   PROTEINUR 30 (A) 03/06/2022 1415   UROBILINOGEN 1.0 05/10/2014 1010   NITRITE NEGATIVE 03/06/2022 1415   LEUKOCYTESUR TRACE (A) 03/06/2022 1415    Radiological Exams on Admission: CT ABDOMEN PELVIS WO CONTRAST  Result Date: 05/20/2022 CLINICAL DATA:  Nausea/vomiting, AKI EXAM: CT ABDOMEN AND PELVIS WITHOUT CONTRAST TECHNIQUE: Multidetector CT imaging of the abdomen and pelvis was performed following the standard protocol without IV contrast. RADIATION DOSE REDUCTION: This exam was performed according to the departmental dose-optimization program which includes automated exposure control, adjustment of the mA and/or kV according to patient size and/or use of iterative reconstruction technique. COMPARISON:  None Available. FINDINGS: Lower chest: Large sliding hiatal hernia containing the stomach. Left basilar atelectasis. Hepatobiliary: No focal liver abnormality is seen. Gallbladder is contracted containing radiopaque gallstone. No radiographic evidence of cholecystitis. No biliary dilatation. Pancreas: Apparent prominence of the head of the pancreas measuring 4.9 x 4.1 cm (image 36/2). Spleen: Normal in size without focal abnormality. Adrenals/Urinary Tract: Adrenal glands are unremarkable. Kidneys are normal, without renal  calculi, focal lesion, or hydronephrosis.  Bladder is unremarkable. Stomach/Bowel: Large sliding hiatal hernia containing part of the stomach. Appendix appears normal. Apparent mild thickening of the ascending and transverse colonic wall. Severe diverticulosis of the sigmoid colon without diverticulitis. Vascular/Lymphatic: Aortic atherosclerosis. No enlarged abdominal or pelvic lymph nodes. Reproductive: Multiple uterine fibroids. Some of the fibroids show popcorn calcifications. Other: There is a complex, well encapsulated, partial wall calcified mass at the right mid abdomen measuring 12 cm AP by 15 cm transverse by 15.7 cm craniocaudad. The mass contains soft tissue and lipomatous components. There are small areas of coarse calcifications seen in the posterior aspect of this cystic mass. There is course calcification of the posterior wall of the mass seen (images 41 through 70 of series 5). Musculoskeletal: There is 1.2 cm nodular soft tissue density seen in the retroareolar mid breast region (image 2/2). With loss of the disc space, endplate marginal osteophytes and endplate sclerosis. IMPRESSION: 1. There is a complex, well encapsulated, partial wall calcified cystic mass in the right mid abdomen containing the fatty, soft tissue components and to a lesser extent small areas of calcifications consistent with mesenteric teratoma measuring approximately 12 x 15 x 15.7 cm. Recommend surgical consultation. 2. Large sliding-type hiatal hernia containing part of the stomach. The herniated stomach shows some thickening of the gastric wall. 3. Questionable mild thickening of the ascending and transverse colonic wall. Severe diverticulosis of the sigmoid colon without diverticulitis. 4. Uterus is enlarged for the patient's age containing multiple fibroids with some of the fibroids showing popcorn calcifications. 5. Cholelithiasis in the contracted gallbladder without cholecystitis. 6. Apparent enlargement of the head of  the pancreas measuring 4.9 x 4.1 cm. Recommend CT/MRI of the abdomen per the pancreatic mass protocol. 7.  Aortic atherosclerosis (ICD10-I70.0) 8. 1.2 cm nodular density in the retroareolar left breast. Correlation with ultrasound/mammogram is suggested. 9. Thoracolumbar spondylosis and is most prominent at L5-S1. At L5-S1, there is loss of the disc space, endplate sclerosis with sclerosis extending into the adjacent vertebral bodies, and prominent endplate osteophytes seen. These findings most probably on the basis of degeneration less likely infection. Clinical and laboratory correlation is suggested in the appropriate clinical setting. Electronically Signed   By: Frazier Richards M.D.   On: 05/20/2022 17:58   DG Chest 2 View  Result Date: 05/20/2022 CLINICAL DATA:  Cough, hypoxia EXAM: CHEST - 2 VIEW COMPARISON:  03/06/2022 FINDINGS: Transverse diameter of heart is increased. Left hemidiaphragm is elevated. Central pulmonary vessels are prominent. There is poor inspiration. Linear densities are seen in the lower lung fields, more so on the left side. IMPRESSION: Central pulmonary vessels are prominent which may be due to poor inspiration. There are no signs of alveolar pulmonary edema. There are linear densities in both lower lung fields suggesting subsegmental atelectasis. Electronically Signed   By: Elmer Picker M.D.   On: 05/20/2022 17:40    EKG: Independently reviewed.  Sinus rhythm at 81 bpm.  Assessment/Plan Principal Problem:   Acute renal failure superimposed on stage 3a chronic kidney disease (HCC) Active Problems:   Class 3 severe obesity with serious comorbidity and body mass index (BMI) of 45.0 to 49.9 in adult [facility]   Essential hypertension   History of breast cancer   Diabetes mellitus (HCC)   Chronic kidney disease, stage III (moderate) (HCC)   Allergic asthma, mild intermittent, uncomplicated   Hyperlipidemia associated with type 2 diabetes mellitus (HCC)   GERD  (gastroesophageal reflux disease)   Abdominal mass   AKI on CKD 3A >  Patient presented when preimaging labs came back showing elevated creatinine.  Creatinine confirmed to be elevated in the ED at 3.73 from baseline of 1.5. > In the setting of ongoing nausea vomiting and also continued antihypertensive use. > Did receive a liter of fluids in the ED. - Monitor on telemetry - Continue with IV fluids overnight - Trend renal function and electrolytes - Hold home losartan and chlorthalidone  Abdominal mass > Patient has had around a week of persistent nausea and vomiting.  CT scan was scheduled outpatient as above but was performed in the ED instead. > CT showed complex mass most consistent with teratoma.  Nonobstructing. > General surgery consulted and states there is no urgent need for surgery but they will evaluate the patient while admitted tomorrow. - Appreciate general surgery recommendations  History of breast cancer Left breast density > CT scan did show a density of the left breast recommending ultrasound or mammogram for follow-up.   GERD Hiatal hernia > Known history of GERD, significant hiatal hernia with some wall thickening noted on CT. - Continue home PPI  Diabetes - SSI  Asthma - Continue as needed albuterol  Hypertension - Continue home carvedilol - Holding home chlorthalidone and losartan in the setting of AKI as above  OSA - Continue home CPAP  Obesity - Noted  DVT prophylaxis: Lovenox Code Status:   Full Family Communication:  None on admission.  She states her family was with her earlier and are up-to-date.  She is also calling other family members to update them.  Disposition Plan:   Patient is from:  Home  Anticipated DC to:  Home  Anticipated DC date:  1 to 3 days  Anticipated DC barriers: None  Consults called:  General surgery, consulted in the ED, will see the patient tomorrow Admission status:  Observation, telemetry  Severity of  Illness: The appropriate patient status for this patient is OBSERVATION. Observation status is judged to be reasonable and necessary in order to provide the required intensity of service to ensure the patient's safety. The patient's presenting symptoms, physical exam findings, and initial radiographic and laboratory data in the context of their medical condition is felt to place them at decreased risk for further clinical deterioration. Furthermore, it is anticipated that the patient will be medically stable for discharge from the hospital within 2 midnights of admission.    Marcelyn Bruins MD [facility]  How to contact the [facility] Attending or Consulting provider [place] or covering provider during after hours [place], for this patient?   Check the care team in [facility] and look for a) attending/consulting [facility] provider listed and b) the [facility] team listed Log into [email or web address] and use [place] universal password to access. If you do not have the password, please contact the hospital operator. Locate the [facility] provider you are looking for under [facility] and page to a number that you can be directly reached. If you still have difficulty reaching the provider, please page the [facility] (Director on Call) for the Hospitalists listed on amion for assistance.  05/20/2022, 7:04 PM

## 2022-05-20 NOTE — Progress Notes (Signed)
Pt refused cpap tonight.  Pt stated she does not use her machine at home and prefers to wear nasal cannula instead.  Pt was advised that RT is available all night should she change her mind.

## 2022-05-20 NOTE — ED Provider Notes (Signed)
[facility] DEPT Provider Note   CSN: [id number] Arrival date & time: 05/20/22  1540     History  Chief Complaint  Patient presents with   Emesis   Hypoxia    [name] is a 71 y.o. female.  Patient with no past abdominal surgery history, diabetes, hypertension -- presents to the emergency department for evaluation of acute kidney injury.  Patient went for an outpatient CT scan today due to having fairly persistent nausea and vomiting over the past 1 week.  She denies associated abdominal pain.  She was also found to have oxygen saturation in the 80s.  She does report cough productive of some sputum recently, no shortness of breath above what she has noted over the past several years.  No fever.  No chest pain.  Per med list, patient used to be on Lasix and metformin however is no longer on these.  States that she was changed to a diuretic for blood pressure.  She is continuing to urinate, but feels likely less than normal.       Home Medications Prior to Admission medications   Medication Sig Start Date End Date Taking? Authorizing Provider  albuterol (PROAIR HFA) 108 (90 Base) MCG/ACT inhaler Inhale 2 puffs into the lungs every 6 (six) hours as needed for wheezing or shortness of breath. 02/13/20   Olalere, Cicero Duck A, MD  BREO ELLIPTA 200-25 MCG/INH AEPB TAKE 1 PUFF BY MOUTH EVERY DAY 01/09/21   Sherrilyn Rist A, MD  canagliflozin (INVOKANA) 300 MG TABS tablet Take 1 tablet (300 mg total) by mouth daily before breakfast. Patient not taking: Reported on 06/23/2021 03/13/21   Mina Marble P, DO  carvedilol (COREG) 25 MG tablet Take 1 tablet (25 mg total) by mouth 2 (two) times daily with a meal. 04/30/22   Paige, Weldon Picking, DO  cholecalciferol (VITAMIN D) 1000 units tablet Take 2,000 Units by mouth daily.    [provider]  Dulaglutide (TRULICITY) 7.09 GG/8.3MO SOPN Inject 0.75 mg into the skin once a week. Patient not taking: Reported on  06/23/2021 03/13/21   Mina Marble P, DO  Fexofenadine-Pseudoephedrine (ALLEGRA-D 24 HOUR PO) Take 1 tablet by mouth daily. Patient not taking: Reported on 06/23/2021    [provider]  fluticasone (FLONASE) 50 MCG/ACT nasal spray Place 2 sprays into both nostrils daily. 04/21/21   Shary Key, DO  furosemide (LASIX) 40 MG tablet Take 1 tablet in the morning as needed for swelling. 11/18/20   Mullis, Kiersten P, DO  glucose blood (GNP TRUE METRIX GLUCOSE STRIPS) test strip Use as instructed 04/21/21   Shary Key, DO  losartan-hydrochlorothiazide (HYZAAR) 100-25 MG tablet Take 1 tablet by mouth daily. 11/18/20   Mullis, Kiersten P, DO  metFORMIN (GLUCOPHAGE-XR) 500 MG 24 hr tablet Take 1 tablet (500 mg total) by mouth at bedtime. 04/21/21   Shary Key, DO  rosuvastatin (CRESTOR) 40 MG tablet Take 1 tablet (40 mg total) by mouth daily. 03/13/21   Mullis, Kiersten P, DO  Semaglutide,0.25 or 0.'5MG'$ /DOS, (OZEMPIC, 0.25 OR 0.5 MG/DOSE,) 2 MG/1.5ML SOPN Inject 0.5 mg into the skin once a week. Patient not taking: Reported on 06/23/2021 03/13/21   Mina Marble P, DO  vitamin C (ASCORBIC ACID) 500 MG tablet Take 500 mg by mouth daily.    [provider]  Vitamin D, Ergocalciferol, (DRISDOL) 1.25 MG (50000 UNIT) CAPS capsule Take 1 capsule (50,000 Units total) by mouth every 7 (seven) days. Patient not taking:  Reported on 06/23/2021 12/24/20   Starlyn Skeans, MD      Allergies    Patient has no known allergies.    Review of Systems   Review of Systems  Physical Exam Updated Vital Signs BP (!) 192/102 (BP Location: Left Arm)   Pulse 80   Temp 97.7 F (36.5 C) (Oral)   Resp 18   Ht '5\' 2"'$  (1.575 m)   Wt 99.6 kg   SpO2 (!) 89%   BMI 40.17 kg/m   Physical Exam Vitals and nursing note reviewed.  Constitutional:      General: She is not in acute distress.    Appearance: She is well-developed.  HENT:     Head: Normocephalic and atraumatic.     Right Ear:  External ear normal.     Left Ear: External ear normal.     Nose: Nose normal. No congestion.     Mouth/Throat:     Mouth: Mucous membranes are dry.  Eyes:     Conjunctiva/sclera: Conjunctivae normal.  Cardiovascular:     Rate and Rhythm: Normal rate and regular rhythm.     Heart sounds: No murmur heard. Pulmonary:     Effort: No respiratory distress.     Breath sounds: No wheezing, rhonchi or rales.     Comments: Talking in full sentences, joking at times and laughing, no distress Abdominal:     Palpations: Abdomen is soft.     Tenderness: There is no abdominal tenderness. There is no guarding or rebound.  Musculoskeletal:     Cervical back: Normal range of motion and neck supple.     Right lower leg: No edema.     Left lower leg: No edema.  Skin:    General: Skin is warm and dry.     Findings: No rash.  Neurological:     General: No focal deficit present.     Mental Status: She is alert. Mental status is at baseline.     Motor: No weakness.  Psychiatric:        Mood and Affect: Mood normal.     ED Results / Procedures / Treatments   Labs (all labs ordered are listed, but only abnormal results are displayed) Labs Reviewed  CBC WITH DIFFERENTIAL/PLATELET - Abnormal; Notable for the following components:      Result Value   RBC 5.25 (*)    MCH 24.2 (*)    MCHC 29.1 (*)    All other components within normal limits  COMPREHENSIVE METABOLIC PANEL - Abnormal; Notable for the following components:   Glucose, Bld 109 (*)    BUN 54 (*)    Creatinine, Ser 3.73 (*)    AST 14 (*)    GFR, Estimated 12 (*)    All other components within normal limits  SARS CORONAVIRUS 2 BY RT PCR  LIPASE, BLOOD  URINALYSIS, ROUTINE W REFLEX MICROSCOPIC  COMPREHENSIVE METABOLIC PANEL  CBC    EKG None  Radiology CT ABDOMEN PELVIS WO CONTRAST  Result Date: 05/20/2022 CLINICAL DATA:  Nausea/vomiting, AKI EXAM: CT ABDOMEN AND PELVIS WITHOUT CONTRAST TECHNIQUE: Multidetector CT imaging of  the abdomen and pelvis was performed following the standard protocol without IV contrast. RADIATION DOSE REDUCTION: This exam was performed according to the departmental dose-optimization program which includes automated exposure control, adjustment of the mA and/or kV according to patient size and/or use of iterative reconstruction technique. COMPARISON:  None Available. FINDINGS: Lower chest: Large sliding hiatal hernia containing the stomach. Left basilar atelectasis.  Hepatobiliary: No focal liver abnormality is seen. Gallbladder is contracted containing radiopaque gallstone. No radiographic evidence of cholecystitis. No biliary dilatation. Pancreas: Apparent prominence of the head of the pancreas measuring 4.9 x 4.1 cm (image 36/2). Spleen: Normal in size without focal abnormality. Adrenals/Urinary Tract: Adrenal glands are unremarkable. Kidneys are normal, without renal calculi, focal lesion, or hydronephrosis. Bladder is unremarkable. Stomach/Bowel: Large sliding hiatal hernia containing part of the stomach. Appendix appears normal. Apparent mild thickening of the ascending and transverse colonic wall. Severe diverticulosis of the sigmoid colon without diverticulitis. Vascular/Lymphatic: Aortic atherosclerosis. No enlarged abdominal or pelvic lymph nodes. Reproductive: Multiple uterine fibroids. Some of the fibroids show popcorn calcifications. Other: There is a complex, well encapsulated, partial wall calcified mass at the right mid abdomen measuring 12 cm AP by 15 cm transverse by 15.7 cm craniocaudad. The mass contains soft tissue and lipomatous components. There are small areas of coarse calcifications seen in the posterior aspect of this cystic mass. There is course calcification of the posterior wall of the mass seen (images 41 through 70 of series 5). Musculoskeletal: There is 1.2 cm nodular soft tissue density seen in the retroareolar mid breast region (image 2/2). With loss of the disc space, endplate  marginal osteophytes and endplate sclerosis. IMPRESSION: 1. There is a complex, well encapsulated, partial wall calcified cystic mass in the right mid abdomen containing the fatty, soft tissue components and to a lesser extent small areas of calcifications consistent with mesenteric teratoma measuring approximately 12 x 15 x 15.7 cm. Recommend surgical consultation. 2. Large sliding-type hiatal hernia containing part of the stomach. The herniated stomach shows some thickening of the gastric wall. 3. Questionable mild thickening of the ascending and transverse colonic wall. Severe diverticulosis of the sigmoid colon without diverticulitis. 4. Uterus is enlarged for the patient's age containing multiple fibroids with some of the fibroids showing popcorn calcifications. 5. Cholelithiasis in the contracted gallbladder without cholecystitis. 6. Apparent enlargement of the head of the pancreas measuring 4.9 x 4.1 cm. Recommend CT/MRI of the abdomen per the pancreatic mass protocol. 7.  Aortic atherosclerosis (ICD10-I70.0) 8. 1.2 cm nodular density in the retroareolar left breast. Correlation with ultrasound/mammogram is suggested. 9. Thoracolumbar spondylosis and is most prominent at L5-S1. At L5-S1, there is loss of the disc space, endplate sclerosis with sclerosis extending into the adjacent vertebral bodies, and prominent endplate osteophytes seen. These findings most probably on the basis of degeneration less likely infection. Clinical and laboratory correlation is suggested in the appropriate clinical setting. Electronically Signed   By: Frazier Richards M.D.   On: 05/20/2022 17:58   DG Chest 2 View  Result Date: 05/20/2022 CLINICAL DATA:  Cough, hypoxia EXAM: CHEST - 2 VIEW COMPARISON:  03/06/2022 FINDINGS: Transverse diameter of heart is increased. Left hemidiaphragm is elevated. Central pulmonary vessels are prominent. There is poor inspiration. Linear densities are seen in the lower lung fields, more so on the  left side. IMPRESSION: Central pulmonary vessels are prominent which may be due to poor inspiration. There are no signs of alveolar pulmonary edema. There are linear densities in both lower lung fields suggesting subsegmental atelectasis. Electronically Signed   By: Elmer Picker M.D.   On: 05/20/2022 17:40    Procedures Procedures    Medications Ordered in ED Medications  carvedilol (COREG) tablet 25 mg (has no administration in time range)  pantoprazole (PROTONIX) EC tablet 40 mg (has no administration in time range)  albuterol (PROVENTIL) (2.5 MG/3ML) 0.083% nebulizer solution 3 mL (has  no administration in time range)  sodium chloride flush (NS) 0.9 % injection 3 mL (has no administration in time range)  0.9 %  sodium chloride infusion (has no administration in time range)  acetaminophen (TYLENOL) tablet 650 mg (has no administration in time range)    Or  acetaminophen (TYLENOL) suppository 650 mg (has no administration in time range)  polyethylene glycol (MIRALAX / GLYCOLAX) packet 17 g (has no administration in time range)  ondansetron (ZOFRAN) tablet 4 mg (has no administration in time range)    Or  ondansetron (ZOFRAN) injection 4 mg (has no administration in time range)  enoxaparin (LOVENOX) injection 30 mg (has no administration in time range)  sodium chloride 0.9 % bolus 1,000 mL (1,000 mLs Intravenous New Bag/Given 05/20/22 1703)  ondansetron (ZOFRAN) injection 4 mg (4 mg Intravenous Given 05/20/22 1700)  pantoprazole (PROTONIX) injection 40 mg (40 mg Intravenous Given 05/20/22 1839)    ED Course/ Medical Decision Making/ A&P    Patient seen and examined. History obtained directly from patient.   Labs/EKG: Ordered CBC, CMP, lipase, UA, COVID test.  Imaging: Ordered CT abdomen pelvis without contrast.  Medications/Fluids: Ordered: IV fluid bolus, Zofran.   Most recent vital signs reviewed and are as follows: BP (!) 192/102 (BP Location: Left Arm)   Pulse 80    Temp 97.7 F (36.5 C) (Oral)   Resp 18   Ht '5\' 2"'$  (1.575 m)   Wt 99.6 kg   SpO2 (!) 89%   BMI 40.17 kg/m   Initial impression: AKI, hypoxia  7:06 PM Patient discussed with and seen by Dr. Darl Householder who has updated patient on findings to this point.  Labs personally reviewed and interpreted including: CBC with normal white blood cell count and hemoglobin; CMP potassium 4.0, creatinine 3.73 with BUN of 54; lipase 44; COVID-negative.  UA pending.  Imaging personally visualized and interpreted including: Chest x-ray, agree negative; CT of the abdomen pelvis without contrast agree with right mid abdominal cystic mass, also reported thickening of the stomach and pancreatic head, no bowel obstruction noted.  Reviewed pertinent lab work and imaging with patient at bedside. Questions answered.   Most current vital signs reviewed and are as follows: BP (!) 154/88   Pulse 78   Temp 97.7 F (36.5 C) (Oral)   Resp 16   Ht '5\' 2"'$  (1.575 m)   Wt 99.6 kg   SpO2 96%   BMI 40.17 kg/m   Plan: Admit to hospital.  Dose of Protonix ordered.  I have consulted with Dr. Brantley Stage of general surgery and discussed CT findings of possible mesenteric teratoma.  Agrees no acute surgical intervention required.  When AKI is improved, will need evaluation to determine if surgical resection is indicated.  I have consulted with Dr. Trilby Drummer with [facility] hospitalist who will patient see patient and admit.                             Medical Decision Making Amount and/or Complexity of Data Reviewed Labs: ordered. Radiology: ordered. ECG/medicine tests: ordered.  Risk Prescription drug management. Decision regarding hospitalization.   Patient with acute knee injury in setting of recent nausea and vomiting.  She is also on some blood pressure medications which could potentially exacerbate the AKI.  No signs of urinary obstruction on CT imaging.  Vomiting currently controlled.  No signs of small bowel obstruction or  large bowel obstruction.  Likely incidental cystic intra-abdominal mass, possibly mesenteric  teratoma.  No signs of acute surgical emergency.  Patient borderline hypoxia on room air.  No COVID, chest x-ray clear without signs of pneumonia.  Low concern for CHF.  Will need continued monitoring.  He is not in any respiratory distress.         Final Clinical Impression(s) / ED Diagnoses Final diagnoses:  Acute kidney injury [facility])  Intraabdominal mass    Rx / DC Orders ED Discharge Orders     None         Carlisle Cater, Hershal Coria 05/20/22 1912    Drenda Freeze, MD 05/20/22 2240

## 2022-05-20 NOTE — Consult Note (Signed)
Reason for Consult:abdominal mass Referring Physician: Trilby Drummer MD   [name] is an 71 y.o. female.  HPI: Pt admitted to medicine service for n/v x 1 week, poor petite and ARF Has large intraabdominal mass 12 cm ? Teratoma as finding. 1 year hx of bloating no weight loss  Past Medical History:  Diagnosis Date   Abnormal electrocardiogram (ECG) (EKG)    Anemia    Arthritis    Asthma    Back pain    Bilateral swelling of feet    Blood transfusion 03/14/2012   Bradycardia    Breast cancer ([facility]) 03/16/2012   right lumpectomy=high grade ductal ca in situ,ER/PR=positive   Cancer ([facility])    right breast ductal carcinoma in situ   Chronic kidney disease    stage III    Diabetes mellitus    type 2   Dyspnea    Edema    GERD (gastroesophageal reflux disease)    Hiatal hernia    large   Hypercholesterolemia    Hypertension    Echocardiogram 03/2011: EF 26-83%, grade 1 diastolic dysfunction, mild MAC, mild LAE   Iron deficiency anemia    Joint pain    Morbid obesity (HCC)    Other fatigue    Personal history of radiation therapy    S/P radiation therapy 05/16/12 - 06/29/12   Right Breast: 50 Gray/ 25 Fractions with Boost: 10 Gray/ 5 Fractions   Shortness of breath    with exertion   Shortness of breath on exertion    Sinus drainage    Sleep apnea    Total knee replacement status 05/15/2014   Use of anastrozole (Arimidex) Started 10/13   Vitamin D deficiency     Past Surgical History:  Procedure Laterality Date   Biospy Right Breast  02/01/12   Right Breast Needle Core Biopsy: ductal Carcinoma In situ with Papillary Features, ER/PR Positive   BREAST EXCISIONAL BIOPSY     left 1998   BREAST LUMPECTOMY  04/1997   Lumpectomy Right Breast  03/16/12   Ductal carcinoma In situ: clear margins: 0/1 Node Negative   TOTAL KNEE ARTHROPLASTY Right 05/15/2014   Procedure: RIGHT TOTAL KNEE ARTHROPLASTY;  Surgeon: Tobi Bastos, MD;  Location: [facility];  Service: Orthopedics;  Laterality:  Right;    Family History  Problem Relation Age of Onset   Hypertension Mother    Diabetes Mother    Heart attack Father 1   Heart disease Father    Sudden death Father    Breast cancer Sister 80   Lymphoma Sister        Deceased at age 32    Social History:  reports that she has never smoked. She has never used smokeless tobacco. She reports that she does not drink alcohol and does not use drugs.  Allergies:  Allergies  Allergen Reactions   Diprivan [Propofol]     Oxygen Levels Lowered    Medications: I have reviewed the patient's current medications.  Results for orders placed or performed during the hospital encounter of 05/20/22 (from the past 48 hour(s))  CBC with Differential     Status: Abnormal   Collection Time: 05/20/22  4:59 PM  Result Value Ref Range   WBC 5.8 4.0 - 10.5 K/uL   RBC 5.25 (H) 3.87 - 5.11 MIL/uL   Hemoglobin 12.7 12.0 - 15.0 g/dL   HCT 43.6 36.0 - 46.0 %   MCV 83.0 80.0 - 100.0 fL   MCH 24.2 (L) 26.0 -  34.0 pg   MCHC 29.1 (L) 30.0 - 36.0 g/dL   RDW 15.5 11.5 - 15.5 %   Platelets 208 150 - 400 K/uL   nRBC 0.0 0.0 - 0.2 %   Neutrophils Relative % 73 %   Neutro Abs 4.3 1.7 - 7.7 K/uL   Lymphocytes Relative 14 %   Lymphs Abs 0.8 0.7 - 4.0 K/uL   Monocytes Relative 8 %   Monocytes Absolute 0.5 0.1 - 1.0 K/uL   Eosinophils Relative 4 %   Eosinophils Absolute 0.2 0.0 - 0.5 K/uL   Basophils Relative 1 %   Basophils Absolute 0.0 0.0 - 0.1 K/uL   Immature Granulocytes 0 %   Abs Immature Granulocytes 0.01 0.00 - 0.07 K/uL    Comment: Performed at [facility], [place] [address] [place], [place] [address]  Comprehensive metabolic panel     Status: Abnormal   Collection Time: 05/20/22  4:59 PM  Result Value Ref Range   Sodium 141 135 - 145 mmol/L   Potassium 4.0 3.5 - 5.1 mmol/L   Chloride 100 98 - 111 mmol/L   CO2 31 22 - 32 mmol/L   Glucose, Bld 109 (H) 70 - 99 mg/dL    Comment: Glucose reference range applies only to  samples taken after fasting for at least 8 hours.   BUN 54 (H) 8 - 23 mg/dL   Creatinine, Ser 3.73 (H) 0.44 - 1.00 mg/dL   Calcium 10.1 8.9 - 10.3 mg/dL   Total Protein 7.4 6.5 - 8.1 g/dL   Albumin 3.6 3.5 - 5.0 g/dL   AST 14 (L) 15 - 41 U/L   ALT 15 0 - 44 U/L   Alkaline Phosphatase 61 38 - 126 U/L   Total Bilirubin 0.4 0.3 - 1.2 mg/dL   GFR, Estimated 12 (L) >60 mL/min    Comment: (NOTE) Calculated using the CKD-EPI Creatinine Equation (2021)    Anion gap 10 5 - 15    Comment: Performed at [facility], [place] [address], [place], [place] [address]  Lipase, blood     Status: None   Collection Time: 05/20/22  4:59 PM  Result Value Ref Range   Lipase 44 11 - 51 U/L    Comment: Performed at [facility], [place] [address] [place], [place] [address]  SARS Coronavirus 2 by RT PCR (hospital order, performed in [facility] hospital lab) *cepheid single result test* Anterior Nasal Swab     Status: None   Collection Time: 05/20/22  5:00 PM   Specimen: Anterior Nasal Swab  Result Value Ref Range   SARS Coronavirus 2 by RT PCR NEGATIVE NEGATIVE    Comment: (NOTE) SARS-CoV-2 target nucleic acids are NOT DETECTED.  The SARS-CoV-2 RNA is generally detectable in upper and lower respiratory specimens during the acute phase of infection. The lowest concentration of SARS-CoV-2 viral copies this assay can detect is 250 copies / mL. A negative result does not preclude SARS-CoV-2 infection and should not be used as the sole basis for treatment or other patient management decisions.  A negative result may occur with improper specimen collection / handling, submission of specimen other than nasopharyngeal swab, presence of viral mutation(s) within the areas targeted by this assay, and inadequate number of viral copies (<250 copies / mL). A negative result must be combined with clinical observations, patient history, and epidemiological information.  Fact Sheet for  Patients:   https://www.patel.info/  Fact Sheet for Healthcare Providers: https://hall.com/  This test is not yet approved or  cleared by the [place] and has been authorized for detection and/or diagnosis of SARS-CoV-2 by FDA under an Emergency Use Authorization (EUA).  This EUA will remain in effect (meaning this test can be used) for the duration of the COVID-19 declaration under Section 564(b)(1) of the Act, 21 U.S.C. section 360bbb-3(b)(1), unless the authorization is terminated or revoked sooner.  Performed at [facility], [place] [address] [place], [place] [address]   Urinalysis, Routine w reflex microscopic Urine, Clean Catch     Status: Abnormal   Collection Time: 05/20/22  6:06 PM  Result Value Ref Range   Color, Urine YELLOW YELLOW   APPearance HAZY (A) CLEAR   Specific Gravity, Urine 1.011 1.005 - 1.030   pH 5.0 5.0 - 8.0   Glucose, UA >=500 (A) NEGATIVE mg/dL   Hgb urine dipstick SMALL (A) NEGATIVE   Bilirubin Urine NEGATIVE NEGATIVE   Ketones, ur NEGATIVE NEGATIVE mg/dL   Protein, ur NEGATIVE NEGATIVE mg/dL   Nitrite NEGATIVE NEGATIVE   Leukocytes,Ua NEGATIVE NEGATIVE   RBC / HPF 0-5 0 - 5 RBC/hpf   WBC, UA 0-5 0 - 5 WBC/hpf   Bacteria, UA MANY (A) NONE SEEN   Squamous Epithelial / LPF 0-5 0 - 5   Hyaline Casts, UA PRESENT     Comment: Performed at [facility], [place] [address], [place],  [address]    CT ABDOMEN PELVIS WO CONTRAST  Result Date: 05/20/2022 CLINICAL DATA:  Nausea/vomiting, AKI EXAM: CT ABDOMEN AND PELVIS WITHOUT CONTRAST TECHNIQUE: Multidetector CT imaging of the abdomen and pelvis was performed following the standard protocol without IV contrast. RADIATION DOSE REDUCTION: This exam was performed according to the departmental dose-optimization program which includes automated exposure control, adjustment of the mA and/or kV according to patient size  and/or use of iterative reconstruction technique. COMPARISON:  None Available. FINDINGS: Lower chest: Large sliding hiatal hernia containing the stomach. Left basilar atelectasis. Hepatobiliary: No focal liver abnormality is seen. Gallbladder is contracted containing radiopaque gallstone. No radiographic evidence of cholecystitis. No biliary dilatation. Pancreas: Apparent prominence of the head of the pancreas measuring 4.9 x 4.1 cm (image 36/2). Spleen: Normal in size without focal abnormality. Adrenals/Urinary Tract: Adrenal glands are unremarkable. Kidneys are normal, without renal calculi, focal lesion, or hydronephrosis. Bladder is unremarkable. Stomach/Bowel: Large sliding hiatal hernia containing part of the stomach. Appendix appears normal. Apparent mild thickening of the ascending and transverse colonic wall. Severe diverticulosis of the sigmoid colon without diverticulitis. Vascular/Lymphatic: Aortic atherosclerosis. No enlarged abdominal or pelvic lymph nodes. Reproductive: Multiple uterine fibroids. Some of the fibroids show popcorn calcifications. Other: There is a complex, well encapsulated, partial wall calcified mass at the right mid abdomen measuring 12 cm AP by 15 cm transverse by 15.7 cm craniocaudad. The mass contains soft tissue and lipomatous components. There are small areas of coarse calcifications seen in the posterior aspect of this cystic mass. There is course calcification of the posterior wall of the mass seen (images 41 through 70 of series 5). Musculoskeletal: There is 1.2 cm nodular soft tissue density seen in the retroareolar mid breast region (image 2/2). With loss of the disc space, endplate marginal osteophytes and endplate sclerosis. IMPRESSION: 1. There is a complex, well encapsulated, partial wall calcified cystic mass in the right mid abdomen containing the fatty, soft tissue components and to a lesser extent small areas of calcifications consistent with mesenteric teratoma  measuring approximately 12 x  15 x 15.7 cm. Recommend surgical consultation. 2. Large sliding-type hiatal hernia containing part of the stomach. The herniated stomach shows some thickening of the gastric wall. 3. Questionable mild thickening of the ascending and transverse colonic wall. Severe diverticulosis of the sigmoid colon without diverticulitis. 4. Uterus is enlarged for the patient's age containing multiple fibroids with some of the fibroids showing popcorn calcifications. 5. Cholelithiasis in the contracted gallbladder without cholecystitis. 6. Apparent enlargement of the head of the pancreas measuring 4.9 x 4.1 cm. Recommend CT/MRI of the abdomen per the pancreatic mass protocol. 7.  Aortic atherosclerosis (ICD10-I70.0) 8. 1.2 cm nodular density in the retroareolar left breast. Correlation with ultrasound/mammogram is suggested. 9. Thoracolumbar spondylosis and is most prominent at L5-S1. At L5-S1, there is loss of the disc space, endplate sclerosis with sclerosis extending into the adjacent vertebral bodies, and prominent endplate osteophytes seen. These findings most probably on the basis of degeneration less likely infection. Clinical and laboratory correlation is suggested in the appropriate clinical setting. Electronically Signed   By: Frazier Richards M.D.   On: 05/20/2022 17:58   DG Chest 2 View  Result Date: 05/20/2022 CLINICAL DATA:  Cough, hypoxia EXAM: CHEST - 2 VIEW COMPARISON:  03/06/2022 FINDINGS: Transverse diameter of heart is increased. Left hemidiaphragm is elevated. Central pulmonary vessels are prominent. There is poor inspiration. Linear densities are seen in the lower lung fields, more so on the left side. IMPRESSION: Central pulmonary vessels are prominent which may be due to poor inspiration. There are no signs of alveolar pulmonary edema. There are linear densities in both lower lung fields suggesting subsegmental atelectasis. Electronically Signed   By: Elmer Picker M.D.    On: 05/20/2022 17:40    Review of Systems  Constitutional:  Positive for appetite change. Negative for unexpected weight change.  Gastrointestinal:  Positive for abdominal distention, nausea and vomiting. Negative for abdominal pain.  All other systems reviewed and are negative.  Blood pressure (!) 154/88, pulse 78, temperature 98.2 F (36.8 C), resp. rate 16, height _0  (1.575 m), weight 99.6 kg, SpO2 96 %. Physical Exam Constitutional:      Appearance: Normal appearance.  HENT:     Head: Normocephalic.     Nose: Nose normal.     Mouth/Throat:     Mouth: Mucous membranes are moist.  Cardiovascular:     Rate and Rhythm: Normal rate.  Abdominal:     Palpations: Abdomen is soft. There is mass.     Tenderness: There is no abdominal tenderness. There is no guarding or rebound.  Musculoskeletal:        General: Normal range of motion.  Skin:    General: Skin is warm.  Neurological:     General: No focal deficit present.     Mental Status: She is alert.  Psychiatric:        Mood and Affect: Mood normal.     Assessment/Plan: Large intraabdominal mass- ? Teratoma - not acute but will need evaluation for resection- correct medical issues and surgery to FU in am.   Turner Daniels MD  05/20/2022, 9:28 PM  TOTAL TIME 30 MINUTES

## 2022-05-20 NOTE — ED Notes (Signed)
Doctor at bedside.

## 2022-05-20 NOTE — ED Triage Notes (Signed)
Pt was at here to have outpatient CT today, but was sent here due to elevated creatinine. Pt endorses N/V for a few days. Pt is hypoxic in triage at 84% on RA. Pt denies any new SHOB or chest pain.

## 2022-05-21 DIAGNOSIS — G4733 Obstructive sleep apnea (adult) (pediatric): Secondary | ICD-10-CM | POA: Diagnosis present

## 2022-05-21 DIAGNOSIS — R0902 Hypoxemia: Secondary | ICD-10-CM | POA: Diagnosis present

## 2022-05-21 DIAGNOSIS — K562 Volvulus: Secondary | ICD-10-CM | POA: Diagnosis present

## 2022-05-21 DIAGNOSIS — E1122 Type 2 diabetes mellitus with diabetic chronic kidney disease: Secondary | ICD-10-CM | POA: Diagnosis present

## 2022-05-21 DIAGNOSIS — I10 Essential (primary) hypertension: Secondary | ICD-10-CM | POA: Diagnosis not present

## 2022-05-21 DIAGNOSIS — Z170 Estrogen receptor positive status [ER+]: Secondary | ICD-10-CM | POA: Diagnosis not present

## 2022-05-21 DIAGNOSIS — N1832 Chronic kidney disease, stage 3b: Secondary | ICD-10-CM | POA: Diagnosis present

## 2022-05-21 DIAGNOSIS — E1169 Type 2 diabetes mellitus with other specified complication: Secondary | ICD-10-CM | POA: Diagnosis not present

## 2022-05-21 DIAGNOSIS — J4520 Mild intermittent asthma, uncomplicated: Secondary | ICD-10-CM | POA: Diagnosis present

## 2022-05-21 DIAGNOSIS — Z20822 Contact with and (suspected) exposure to covid-19: Secondary | ICD-10-CM | POA: Diagnosis present

## 2022-05-21 DIAGNOSIS — K219 Gastro-esophageal reflux disease without esophagitis: Secondary | ICD-10-CM | POA: Diagnosis present

## 2022-05-21 DIAGNOSIS — R1900 Intra-abdominal and pelvic swelling, mass and lump, unspecified site: Secondary | ICD-10-CM | POA: Diagnosis present

## 2022-05-21 DIAGNOSIS — N170 Acute kidney failure with tubular necrosis: Secondary | ICD-10-CM | POA: Diagnosis present

## 2022-05-21 DIAGNOSIS — N179 Acute kidney failure, unspecified: Secondary | ICD-10-CM | POA: Diagnosis present

## 2022-05-21 DIAGNOSIS — E860 Dehydration: Secondary | ICD-10-CM | POA: Diagnosis present

## 2022-05-21 DIAGNOSIS — M7989 Other specified soft tissue disorders: Secondary | ICD-10-CM | POA: Diagnosis not present

## 2022-05-21 DIAGNOSIS — K440 Diaphragmatic hernia with obstruction, without gangrene: Secondary | ICD-10-CM | POA: Diagnosis present

## 2022-05-21 DIAGNOSIS — I742 Embolism and thrombosis of arteries of the upper extremities: Secondary | ICD-10-CM | POA: Diagnosis not present

## 2022-05-21 DIAGNOSIS — Z6841 Body Mass Index (BMI) 40.0 and over, adult: Secondary | ICD-10-CM | POA: Diagnosis not present

## 2022-05-21 DIAGNOSIS — M1990 Unspecified osteoarthritis, unspecified site: Secondary | ICD-10-CM | POA: Diagnosis present

## 2022-05-21 DIAGNOSIS — E8770 Fluid overload, unspecified: Secondary | ICD-10-CM | POA: Diagnosis not present

## 2022-05-21 DIAGNOSIS — E7800 Pure hypercholesterolemia, unspecified: Secondary | ICD-10-CM | POA: Diagnosis present

## 2022-05-21 DIAGNOSIS — E871 Hypo-osmolality and hyponatremia: Secondary | ICD-10-CM | POA: Diagnosis not present

## 2022-05-21 DIAGNOSIS — E11649 Type 2 diabetes mellitus with hypoglycemia without coma: Secondary | ICD-10-CM | POA: Diagnosis present

## 2022-05-21 DIAGNOSIS — K3189 Other diseases of stomach and duodenum: Secondary | ICD-10-CM | POA: Diagnosis not present

## 2022-05-21 DIAGNOSIS — I129 Hypertensive chronic kidney disease with stage 1 through stage 4 chronic kidney disease, or unspecified chronic kidney disease: Secondary | ICD-10-CM | POA: Diagnosis present

## 2022-05-21 LAB — CBC
HCT: 40.8 % (ref 36.0–46.0)
Hemoglobin: 11.9 g/dL — ABNORMAL LOW (ref 12.0–15.0)
MCH: 24.4 pg — ABNORMAL LOW (ref 26.0–34.0)
MCHC: 29.2 g/dL — ABNORMAL LOW (ref 30.0–36.0)
MCV: 83.8 fL (ref 80.0–100.0)
Platelets: 211 10*3/uL (ref 150–400)
RBC: 4.87 MIL/uL (ref 3.87–5.11)
RDW: 15.6 % — ABNORMAL HIGH (ref 11.5–15.5)
WBC: 4.9 10*3/uL (ref 4.0–10.5)
nRBC: 0 % (ref 0.0–0.2)

## 2022-05-21 LAB — COMPREHENSIVE METABOLIC PANEL
ALT: 11 U/L (ref 0–44)
AST: 24 U/L (ref 15–41)
Albumin: 3.1 g/dL — ABNORMAL LOW (ref 3.5–5.0)
Alkaline Phosphatase: 51 U/L (ref 38–126)
Anion gap: 6 (ref 5–15)
BUN: 46 mg/dL — ABNORMAL HIGH (ref 8–23)
CO2: 28 mmol/L (ref 22–32)
Calcium: 8.8 mg/dL — ABNORMAL LOW (ref 8.9–10.3)
Chloride: 109 mmol/L (ref 98–111)
Creatinine, Ser: 3.16 mg/dL — ABNORMAL HIGH (ref 0.44–1.00)
GFR, Estimated: 15 mL/min — ABNORMAL LOW (ref >60)
Glucose, Bld: 87 mg/dL (ref 70–99)
Potassium: 4.4 mmol/L (ref 3.5–5.1)
Sodium: 143 mmol/L (ref 135–145)
Total Bilirubin: 1.3 mg/dL — ABNORMAL HIGH (ref 0.3–1.2)
Total Protein: 6.5 g/dL (ref 6.5–8.1)

## 2022-05-21 LAB — CBG MONITORING, ED: Glucose-Capillary: 84 mg/dL (ref 70–99)

## 2022-05-21 LAB — GLUCOSE, CAPILLARY: Glucose-Capillary: 99 mg/dL (ref 70–99)

## 2022-05-21 MED ORDER — SCOPOLAMINE 1 MG/3DAYS TD PT72: 1.0000 | MEDICATED_PATCH | TRANSDERMAL | Status: DC

## 2022-05-21 MED ORDER — HYDRALAZINE HCL 25 MG PO TABS: 25.0000 mg | ORAL_TABLET | Freq: Four times a day (QID) | ORAL | Status: DC | PRN

## 2022-05-21 MED ORDER — METRONIDAZOLE 500 MG/100ML IV SOLN: 500.0000 mg | INTRAVENOUS | Status: DC

## 2022-05-21 MED ORDER — ACETAMINOPHEN 500 MG PO TABS
1000.0000 mg | ORAL_TABLET | ORAL | Status: AC
  Administered 2022-05-22: 1000 mg via ORAL

## 2022-05-21 MED ORDER — SODIUM CHLORIDE 0.9 % IV SOLN: INTRAVENOUS | Status: DC

## 2022-05-21 MED ORDER — ENSURE PRE-SURGERY PO LIQD
592.0000 mL | Freq: Once | ORAL | Status: DC
  Filled 2022-05-21: qty 592

## 2022-05-21 MED ORDER — CHLORHEXIDINE GLUCONATE CLOTH 2 % EX PADS
6.0000 | MEDICATED_PAD | Freq: Once | CUTANEOUS | Status: AC
  Administered 2022-05-21: 6 via TOPICAL

## 2022-05-21 MED ORDER — DEXAMETHASONE SODIUM PHOSPHATE 4 MG/ML IJ SOLN: 4.0000 mg | INTRAMUSCULAR | Status: AC

## 2022-05-21 MED ORDER — SODIUM CHLORIDE 0.9 % IV SOLN: 2.0000 g | INTRAVENOUS | Status: DC

## 2022-05-21 MED ORDER — CHLORHEXIDINE GLUCONATE CLOTH 2 % EX PADS: 6.0000 | MEDICATED_PAD | Freq: Once | CUTANEOUS | Status: DC

## 2022-05-21 MED ORDER — GABAPENTIN 300 MG PO CAPS
300.0000 mg | ORAL_CAPSULE | ORAL | Status: AC
  Administered 2022-05-22: 300 mg via ORAL

## 2022-05-21 MED ORDER — ENSURE PRE-SURGERY PO LIQD
296.0000 mL | Freq: Once | ORAL | Status: AC
  Administered 2022-05-22: 296 mL via ORAL

## 2022-05-21 MED ORDER — BUPIVACAINE LIPOSOME 1.3 % IJ SUSP: 20.0000 mL | Freq: Once | INTRAMUSCULAR | Status: DC

## 2022-05-21 NOTE — Progress Notes (Signed)
Subjective: Patient with weeks and months worth of symptoms of reflux, nausea, vomiting, inability to eat or drink well.  She had a chronically incarcerated hiatal hernia.  Was evaluated with endoscopy by Dr. Michail Sermon.  Alos noted to have teratoma on CT scan.  Came in to the hospital this time for inability to eat/drink and overall feeling poor.  No significant abdominal pain.  ROS: See above, otherwise other systems negative  Objective: Vital signs in last 24 hours: Temp:  [97.6 F (36.4 C)-98.2 F (36.8 C)] 97.6 F (36.4 C) (08/24 0814) Pulse Rate:  [72-82] 82 (08/24 0814) Resp:  [14-24] 19 (08/24 0814) BP: (127-192)/(74-102) 130/85 (08/24 0814) SpO2:  [89 %-98 %] 98 % (08/24 0814) Weight:  [99.6 kg] 99.6 kg (08/23 1621)    Intake/Output from previous day: No intake/output data recorded. Intake/Output this shift: No intake/output data recorded.  PE: Gen: NAD Lungs: unlabored on nasal cannula Abd: soft, NT, ND  Lab Results:  Recent Labs    05/20/22 1659 05/21/22 0500  WBC 5.8 4.9  HGB 12.7 11.9*  HCT 43.6 40.8  PLT 208 211   BMET Recent Labs    05/20/22 1659 05/21/22 0500  NA 141 143  K 4.0 4.4  CL 100 109  CO2 31 28  GLUCOSE 109* 87  BUN 54* 46*  CREATININE 3.73* 3.16*  CALCIUM 10.1 8.8*   PT/INR No results for input(s): "LABPROT", "INR" in the last 72 hours. CMP     Component Value Date/Time   NA 143 05/21/2022 0500   NA 141 12/10/2020 1027   NA 143 04/12/2017 0939   K 4.4 05/21/2022 0500   K 4.2 04/12/2017 0939   CL 109 05/21/2022 0500   CL 104 02/02/2013 1127   CO2 28 05/21/2022 0500   CO2 30 (H) 04/12/2017 0939   GLUCOSE 87 05/21/2022 0500   GLUCOSE 70 04/12/2017 0939   GLUCOSE 95 02/02/2013 1127   BUN 46 (H) 05/21/2022 0500   BUN 25 12/10/2020 1027   BUN 20.5 04/12/2017 0939   CREATININE 3.16 (H) 05/21/2022 0500   CREATININE 1.2 (H) 04/12/2017 0939   CALCIUM 8.8 (L) 05/21/2022 0500   CALCIUM 9.8 04/12/2017 0939   PROT 6.5  05/21/2022 0500   PROT 6.7 12/10/2020 1027   PROT 7.0 04/12/2017 0939   ALBUMIN 3.1 (L) 05/21/2022 0500   ALBUMIN 3.8 12/10/2020 1027   ALBUMIN 3.3 (L) 04/12/2017 0939   AST 24 05/21/2022 0500   AST 15 04/12/2017 0939   ALT 11 05/21/2022 0500   ALT 25 04/12/2017 0939   ALKPHOS 51 05/21/2022 0500   ALKPHOS 67 04/12/2017 0939   BILITOT 1.3 (H) 05/21/2022 0500   BILITOT 0.2 12/10/2020 1027   BILITOT 0.53 04/12/2017 0939   GFRNONAA 15 (L) 05/21/2022 0500   GFRAA 35 (L) 11/18/2020 1544   Lipase     Component Value Date/Time   LIPASE 44 05/20/2022 1659       Studies/Results: CT ABDOMEN PELVIS WO CONTRAST  Result Date: 05/20/2022 CLINICAL DATA:  Nausea/vomiting, AKI EXAM: CT ABDOMEN AND PELVIS WITHOUT CONTRAST TECHNIQUE: Multidetector CT imaging of the abdomen and pelvis was performed following the standard protocol without IV contrast. RADIATION DOSE REDUCTION: This exam was performed according to the departmental dose-optimization program which includes automated exposure control, adjustment of the mA and/or kV according to patient size and/or use of iterative reconstruction technique. COMPARISON:  None Available. FINDINGS: Lower chest: Large sliding hiatal hernia containing the stomach. Left  basilar atelectasis. Hepatobiliary: No focal liver abnormality is seen. Gallbladder is contracted containing radiopaque gallstone. No radiographic evidence of cholecystitis. No biliary dilatation. Pancreas: Apparent prominence of the head of the pancreas measuring 4.9 x 4.1 cm (image 36/2). Spleen: Normal in size without focal abnormality. Adrenals/Urinary Tract: Adrenal glands are unremarkable. Kidneys are normal, without renal calculi, focal lesion, or hydronephrosis. Bladder is unremarkable. Stomach/Bowel: Large sliding hiatal hernia containing part of the stomach. Appendix appears normal. Apparent mild thickening of the ascending and transverse colonic wall. Severe diverticulosis of the sigmoid  colon without diverticulitis. Vascular/Lymphatic: Aortic atherosclerosis. No enlarged abdominal or pelvic lymph nodes. Reproductive: Multiple uterine fibroids. Some of the fibroids show popcorn calcifications. Other: There is a complex, well encapsulated, partial wall calcified mass at the right mid abdomen measuring 12 cm AP by 15 cm transverse by 15.7 cm craniocaudad. The mass contains soft tissue and lipomatous components. There are small areas of coarse calcifications seen in the posterior aspect of this cystic mass. There is course calcification of the posterior wall of the mass seen (images 41 through 70 of series 5). Musculoskeletal: There is 1.2 cm nodular soft tissue density seen in the retroareolar mid breast region (image 2/2). With loss of the disc space, endplate marginal osteophytes and endplate sclerosis. IMPRESSION: 1. There is a complex, well encapsulated, partial wall calcified cystic mass in the right mid abdomen containing the fatty, soft tissue components and to a lesser extent small areas of calcifications consistent with mesenteric teratoma measuring approximately 12 x 15 x 15.7 cm. Recommend surgical consultation. 2. Large sliding-type hiatal hernia containing part of the stomach. The herniated stomach shows some thickening of the gastric wall. 3. Questionable mild thickening of the ascending and transverse colonic wall. Severe diverticulosis of the sigmoid colon without diverticulitis. 4. Uterus is enlarged for the patient's age containing multiple fibroids with some of the fibroids showing popcorn calcifications. 5. Cholelithiasis in the contracted gallbladder without cholecystitis. 6. Apparent enlargement of the head of the pancreas measuring 4.9 x 4.1 cm. Recommend CT/MRI of the abdomen per the pancreatic mass protocol. 7.  Aortic atherosclerosis (ICD10-I70.0) 8. 1.2 cm nodular density in the retroareolar left breast. Correlation with ultrasound/mammogram is suggested. 9. Thoracolumbar  spondylosis and is most prominent at L5-S1. At L5-S1, there is loss of the disc space, endplate sclerosis with sclerosis extending into the adjacent vertebral bodies, and prominent endplate osteophytes seen. These findings most probably on the basis of degeneration less likely infection. Clinical and laboratory correlation is suggested in the appropriate clinical setting. Electronically Signed   By: Frazier Richards M.D.   On: 05/20/2022 17:58   DG Chest 2 View  Result Date: 05/20/2022 CLINICAL DATA:  Cough, hypoxia EXAM: CHEST - 2 VIEW COMPARISON:  03/06/2022 FINDINGS: Transverse diameter of heart is increased. Left hemidiaphragm is elevated. Central pulmonary vessels are prominent. There is poor inspiration. Linear densities are seen in the lower lung fields, more so on the left side. IMPRESSION: Central pulmonary vessels are prominent which may be due to poor inspiration. There are no signs of alveolar pulmonary edema. There are linear densities in both lower lung fields suggesting subsegmental atelectasis. Electronically Signed   By: Elmer Picker M.D.   On: 05/20/2022 17:40    Anti-infectives: Anti-infectives (From admission, onward)    None        Assessment/Plan Chronically incarcerated hiatal hernia with incidental teratoma -the patient's presenting symptoms are more c/w her hiatal hernia and chronic incarceration.  She does not have rotation or  evidence of strangulation on imaging -teratoma is likely an incidental find and not related to her current symptoms -needs medical improvement with her AKI, etc prior to talking about OR, but will likely need her hiatal hernia fixed this admission to help resolve her symptoms. -teratoma can be addressed later on a non-urgent basis -NPO, if continues to have N/V, may require NGT placement -cont IVFs for hydration  FEN - NPO/IVFs VTE - lovenox ID - none currently needed  AKI on CKD OSA Obesity HTN DM GERD Asthma H/O breast  cancer  I reviewed hospitalist notes, last 24 h vitals and pain scores, last 48 h intake and output, last 24 h labs and trends, and last 24 h imaging results.   LOS: 0 days    Henreitta Cea , [facility] 05/21/2022, 8:50 AM Please see Amion for pager number during day hours 7:00am-4:30pm or 7:00am -11:30am on weekends

## 2022-05-21 NOTE — H&P (View-Only) (Signed)
Subjective: Patient with weeks and months worth of symptoms of reflux, nausea, vomiting, inability to eat or drink well.  She had a chronically incarcerated hiatal hernia.  Was evaluated with endoscopy by Dr. Michail Sermon.  Alos noted to have teratoma on CT scan.  Came in to the hospital this time for inability to eat/drink and overall feeling poor.  No significant abdominal pain.  ROS: See above, otherwise other systems negative  Objective: Vital signs in last 24 hours: Temp:  [97.6 F (36.4 C)-98.2 F (36.8 C)] 97.6 F (36.4 C) (08/24 0814) Pulse Rate:  [72-82] 82 (08/24 0814) Resp:  [14-24] 19 (08/24 0814) BP: (127-192)/(74-102) 130/85 (08/24 0814) SpO2:  [89 %-98 %] 98 % (08/24 0814) Weight:  [99.6 kg] 99.6 kg (08/23 1621)    Intake/Output from previous day: No intake/output data recorded. Intake/Output this shift: No intake/output data recorded.  PE: Gen: NAD Lungs: unlabored on nasal cannula Abd: soft, NT, ND  Lab Results:  Recent Labs    05/20/22 1659 05/21/22 0500  WBC 5.8 4.9  HGB 12.7 11.9*  HCT 43.6 40.8  PLT 208 211   BMET Recent Labs    05/20/22 1659 05/21/22 0500  NA 141 143  K 4.0 4.4  CL 100 109  CO2 31 28  GLUCOSE 109* 87  BUN 54* 46*  CREATININE 3.73* 3.16*  CALCIUM 10.1 8.8*   PT/INR No results for input(s): "LABPROT", "INR" in the last 72 hours. CMP     Component Value Date/Time   NA 143 05/21/2022 0500   NA 141 12/10/2020 1027   NA 143 04/12/2017 0939   K 4.4 05/21/2022 0500   K 4.2 04/12/2017 0939   CL 109 05/21/2022 0500   CL 104 02/02/2013 1127   CO2 28 05/21/2022 0500   CO2 30 (H) 04/12/2017 0939   GLUCOSE 87 05/21/2022 0500   GLUCOSE 70 04/12/2017 0939   GLUCOSE 95 02/02/2013 1127   BUN 46 (H) 05/21/2022 0500   BUN 25 12/10/2020 1027   BUN 20.5 04/12/2017 0939   CREATININE 3.16 (H) 05/21/2022 0500   CREATININE 1.2 (H) 04/12/2017 0939   CALCIUM 8.8 (L) 05/21/2022 0500   CALCIUM 9.8 04/12/2017 0939   PROT 6.5  05/21/2022 0500   PROT 6.7 12/10/2020 1027   PROT 7.0 04/12/2017 0939   ALBUMIN 3.1 (L) 05/21/2022 0500   ALBUMIN 3.8 12/10/2020 1027   ALBUMIN 3.3 (L) 04/12/2017 0939   AST 24 05/21/2022 0500   AST 15 04/12/2017 0939   ALT 11 05/21/2022 0500   ALT 25 04/12/2017 0939   ALKPHOS 51 05/21/2022 0500   ALKPHOS 67 04/12/2017 0939   BILITOT 1.3 (H) 05/21/2022 0500   BILITOT 0.2 12/10/2020 1027   BILITOT 0.53 04/12/2017 0939   GFRNONAA 15 (L) 05/21/2022 0500   GFRAA 35 (L) 11/18/2020 1544   Lipase     Component Value Date/Time   LIPASE 44 05/20/2022 1659       Studies/Results: CT ABDOMEN PELVIS WO CONTRAST  Result Date: 05/20/2022 CLINICAL DATA:  Nausea/vomiting, AKI EXAM: CT ABDOMEN AND PELVIS WITHOUT CONTRAST TECHNIQUE: Multidetector CT imaging of the abdomen and pelvis was performed following the standard protocol without IV contrast. RADIATION DOSE REDUCTION: This exam was performed according to the departmental dose-optimization program which includes automated exposure control, adjustment of the mA and/or kV according to patient size and/or use of iterative reconstruction technique. COMPARISON:  None Available. FINDINGS: Lower chest: Large sliding hiatal hernia containing the stomach. Left  basilar atelectasis. Hepatobiliary: No focal liver abnormality is seen. Gallbladder is contracted containing radiopaque gallstone. No radiographic evidence of cholecystitis. No biliary dilatation. Pancreas: Apparent prominence of the head of the pancreas measuring 4.9 x 4.1 cm (image 36/2). Spleen: Normal in size without focal abnormality. Adrenals/Urinary Tract: Adrenal glands are unremarkable. Kidneys are normal, without renal calculi, focal lesion, or hydronephrosis. Bladder is unremarkable. Stomach/Bowel: Large sliding hiatal hernia containing part of the stomach. Appendix appears normal. Apparent mild thickening of the ascending and transverse colonic wall. Severe diverticulosis of the sigmoid  colon without diverticulitis. Vascular/Lymphatic: Aortic atherosclerosis. No enlarged abdominal or pelvic lymph nodes. Reproductive: Multiple uterine fibroids. Some of the fibroids show popcorn calcifications. Other: There is a complex, well encapsulated, partial wall calcified mass at the right mid abdomen measuring 12 cm AP by 15 cm transverse by 15.7 cm craniocaudad. The mass contains soft tissue and lipomatous components. There are small areas of coarse calcifications seen in the posterior aspect of this cystic mass. There is course calcification of the posterior wall of the mass seen (images 41 through 70 of series 5). Musculoskeletal: There is 1.2 cm nodular soft tissue density seen in the retroareolar mid breast region (image 2/2). With loss of the disc space, endplate marginal osteophytes and endplate sclerosis. IMPRESSION: 1. There is a complex, well encapsulated, partial wall calcified cystic mass in the right mid abdomen containing the fatty, soft tissue components and to a lesser extent small areas of calcifications consistent with mesenteric teratoma measuring approximately 12 x 15 x 15.7 cm. Recommend surgical consultation. 2. Large sliding-type hiatal hernia containing part of the stomach. The herniated stomach shows some thickening of the gastric wall. 3. Questionable mild thickening of the ascending and transverse colonic wall. Severe diverticulosis of the sigmoid colon without diverticulitis. 4. Uterus is enlarged for the patient's age containing multiple fibroids with some of the fibroids showing popcorn calcifications. 5. Cholelithiasis in the contracted gallbladder without cholecystitis. 6. Apparent enlargement of the head of the pancreas measuring 4.9 x 4.1 cm. Recommend CT/MRI of the abdomen per the pancreatic mass protocol. 7.  Aortic atherosclerosis (ICD10-I70.0) 8. 1.2 cm nodular density in the retroareolar left breast. Correlation with ultrasound/mammogram is suggested. 9. Thoracolumbar  spondylosis and is most prominent at L5-S1. At L5-S1, there is loss of the disc space, endplate sclerosis with sclerosis extending into the adjacent vertebral bodies, and prominent endplate osteophytes seen. These findings most probably on the basis of degeneration less likely infection. Clinical and laboratory correlation is suggested in the appropriate clinical setting. Electronically Signed   By: Frazier Richards M.D.   On: 05/20/2022 17:58   DG Chest 2 View  Result Date: 05/20/2022 CLINICAL DATA:  Cough, hypoxia EXAM: CHEST - 2 VIEW COMPARISON:  03/06/2022 FINDINGS: Transverse diameter of heart is increased. Left hemidiaphragm is elevated. Central pulmonary vessels are prominent. There is poor inspiration. Linear densities are seen in the lower lung fields, more so on the left side. IMPRESSION: Central pulmonary vessels are prominent which may be due to poor inspiration. There are no signs of alveolar pulmonary edema. There are linear densities in both lower lung fields suggesting subsegmental atelectasis. Electronically Signed   By: Elmer Picker M.D.   On: 05/20/2022 17:40    Anti-infectives: Anti-infectives (From admission, onward)    None        Assessment/Plan Chronically incarcerated hiatal hernia with incidental teratoma -the patient's presenting symptoms are more c/w her hiatal hernia and chronic incarceration.  She does not have rotation or  evidence of strangulation on imaging -teratoma is likely an incidental find and not related to her current symptoms -needs medical improvement with her AKI, etc prior to talking about OR, but will likely need her hiatal hernia fixed this admission to help resolve her symptoms. -teratoma can be addressed later on a non-urgent basis -NPO, if continues to have N/V, may require NGT placement -cont IVFs for hydration  FEN - NPO/IVFs VTE - lovenox ID - none currently needed  AKI on CKD OSA Obesity HTN DM GERD Asthma H/O breast  cancer  I reviewed hospitalist notes, last 24 h vitals and pain scores, last 48 h intake and output, last 24 h labs and trends, and last 24 h imaging results.   LOS: 0 days    [name] , [facility] 05/21/2022, 8:50 AM Please see Amion for pager number during day hours 7:00am-4:30pm or 7:00am -11:30am on weekends

## 2022-05-21 NOTE — ED Notes (Signed)
Patient's CBG '99mg'$ /dL (monitor did not dock, nurse notified)

## 2022-05-21 NOTE — Progress Notes (Signed)
PROGRESS NOTE    [name]  OVF:[id number] DOB: [birth year] DOA: 05/20/2022 PCP: Shary Key, DO    Brief Narrative:  71 year old with history of hypertension, type 2 diabetes on insulin, CKD stage IIIa with baseline creatinine about 1.6-1.9, hyperlipidemia, history of breast cancer, GERD, untreated sleep apnea presented to the emergency room with abnormal labs.  Patient was scheduled for a CT scan, lab test showed greatly elevated creatinine so she was sent to the emergency room. In the emergency room hemodynamically stable.  Creatinine 3.73.  CT scan of the abdomen pelvis with complex encapsulated teratoma like mass on the right mid abdomen, large hiatal hernia, enlarged pancreatic head.  Admitted with significant symptoms and surgery consulted.   Assessment & Plan:   Acute kidney injury on CKD stage IIIa: Due to prerenal injury secondary to poor oral intake and ongoing nausea for more than 2 weeks. CT scan with no evidence of hydronephrosis. Baseline creatinine 1.5-1.9 and followed by outpatient nephrologist. Already clinically improving. Continue isotonic fluid, intake and output monitoring.  Holding home dose of losartan and chlorthalidone.  Anticipate it will stabilize with improvement of symptoms.  Nausea vomiting without abdominal pain: Abnormal CT scan. Symptoms likely related to reflux and large hiatal hernia. Continue PPI and IV fluids.  Nausea medications. Followed by surgery, anticipating surgical correction of hiatal hernia. Not sure teratoma needs to have surgical resection.  Essential hypertension: Blood pressure stable on carvedilol.  Continue to hold chlorthalidone and losartan.  Will use alternate medication if needed.  Obesity with sleep apnea: Does not use CPAP at home.  Discontinue CPAP orders.  Oxygen as needed at nights.  Type 2 diabetes: On Jardiance at home.  Holding.  On sliding scale insulin.   DVT prophylaxis: enoxaparin (LOVENOX) injection 30  mg Start: 05/20/22 2200   Code Status: Full code  Family Communication: None Disposition Plan: Status is: Observation The patient will require care spanning > 2 midnights and should be moved to inpatient because: IV fluids, acute renal failure, inpatient surgery needed.     Consultants:  General surgery  Procedures:  None  Antimicrobials:  None   Subjective: Patient was seen and examined.  Denies any active nausea or vomiting.  Denies any abdominal pain.  She is just worried that if she tries to eat anything she will start vomiting.  Remains afebrile.   Objective: Vitals:   05/21/22 0300 05/21/22 0345 05/21/22 0436 05/21/22 0814  BP: (!) 144/74  133/85 130/85  Pulse: 72  80 82  Resp: '19  17 19  '$ Temp:  98.2 F (36.8 C) 97.9 F (36.6 C) 97.6 F (36.4 C)  TempSrc:   Oral Oral  SpO2:   95% 98%  Weight:      Height:       No intake or output data in the 24 hours ending 05/21/22 1125 Filed Weights   05/20/22 1621  Weight: 99.6 kg    Examination:  General exam: Appears calm and comfortable, on room air. Respiratory system: No added sounds. Cardiovascular system: S1 & S2 heard, RRR. No pedal edema. Gastrointestinal system: Soft.  Obese and pendulous.  Nontender. Central nervous system: Alert and oriented. No focal neurological deficits. Extremities: Symmetric 5 x 5 power. Skin: No rashes, lesions or ulcers Psychiatry: Judgement and insight appear normal. Mood & affect appropriate.     Data Reviewed: I have personally reviewed following labs and imaging studies  CBC: Recent Labs  Lab 05/20/22 1659 05/21/22 0500  WBC 5.8 4.9  NEUTROABS 4.3  --   HGB 12.7 11.9*  HCT 43.6 40.8  MCV 83.0 83.8  PLT 208 814   Basic Metabolic Panel: Recent Labs  Lab 05/20/22 1510 05/20/22 1659 05/21/22 0500  NA  --  141 143  K  --  4.0 4.4  CL  --  100 109  CO2  --  31 28  GLUCOSE  --  109* 87  BUN  --  54* 46*  CREATININE 4.10* 3.73* 3.16*  CALCIUM  --  10.1 8.8*    GFR: Estimated Creatinine Clearance: 17.5 mL/min (A) (by C-G formula based on SCr of 3.16 mg/dL (H)). Liver Function Tests: Recent Labs  Lab 05/20/22 1659 05/21/22 0500  AST 14* 24  ALT 15 11  ALKPHOS 61 51  BILITOT 0.4 1.3*  PROT 7.4 6.5  ALBUMIN 3.6 3.1*   Recent Labs  Lab 05/20/22 1659  LIPASE 44   No results for input(s): "AMMONIA" in the last 168 hours. Coagulation Profile: No results for input(s): "INR", "PROTIME" in the last 168 hours. Cardiac Enzymes: No results for input(s): "CKTOTAL", "CKMB", "CKMBINDEX", "TROPONINI" in the last 168 hours. BNP (last 3 results) No results for input(s): "PROBNP" in the last 8760 hours. HbA1C: No results for input(s): "HGBA1C" in the last 72 hours. CBG: Recent Labs  Lab 05/20/22 2201 05/21/22 0841  GLUCAP 80 99   Lipid Profile: No results for input(s): "CHOL", "HDL", "LDLCALC", "TRIG", "CHOLHDL", "LDLDIRECT" in the last 72 hours. Thyroid Function Tests: No results for input(s): "TSH", "T4TOTAL", "FREET4", "T3FREE", "THYROIDAB" in the last 72 hours. Anemia Panel: No results for input(s): "VITAMINB12", "FOLATE", "FERRITIN", "TIBC", "IRON", "RETICCTPCT" in the last 72 hours. Sepsis Labs: No results for input(s): "PROCALCITON", "LATICACIDVEN" in the last 168 hours.  Recent Results (from the past 240 hour(s))  SARS Coronavirus 2 by RT PCR (hospital order, performed in [facility] hospital lab) *cepheid single result test* Anterior Nasal Swab     Status: None   Collection Time: 05/20/22  5:00 PM   Specimen: Anterior Nasal Swab  Result Value Ref Range Status   SARS Coronavirus 2 by RT PCR NEGATIVE NEGATIVE Final    Comment: (NOTE) SARS-CoV-2 target nucleic acids are NOT DETECTED.  The SARS-CoV-2 RNA is generally detectable in upper and lower respiratory specimens during the acute phase of infection. The lowest concentration of SARS-CoV-2 viral copies this assay can detect is 250 copies / mL. A negative result does not  preclude SARS-CoV-2 infection and should not be used as the sole basis for treatment or other patient management decisions.  A negative result may occur with improper specimen collection / handling, submission of specimen other than nasopharyngeal swab, presence of viral mutation(s) within the areas targeted by this assay, and inadequate number of viral copies (<250 copies / mL). A negative result must be combined with clinical observations, patient history, and epidemiological information.  Fact Sheet for Patients:   https://www.patel.info/  Fact Sheet for Healthcare Providers: https://hall.com/  This test is not yet approved or  cleared by the [place] FDA and has been authorized for detection and/or diagnosis of SARS-CoV-2 by FDA under an Emergency Use Authorization (EUA).  This EUA will remain in effect (meaning this test can be used) for the duration of the COVID-19 declaration under Section 564(b)(1) of the Act, 21 U.S.C. section 360bbb-3(b)(1), unless the authorization is terminated or revoked sooner.  Performed at [facility], [place] [address] [place], [place] [address]  Radiology Studies: CT ABDOMEN PELVIS WO CONTRAST  Result Date: 05/20/2022 CLINICAL DATA:  Nausea/vomiting, AKI EXAM: CT ABDOMEN AND PELVIS WITHOUT CONTRAST TECHNIQUE: Multidetector CT imaging of the abdomen and pelvis was performed following the standard protocol without IV contrast. RADIATION DOSE REDUCTION: This exam was performed according to the departmental dose-optimization program which includes automated exposure control, adjustment of the mA and/or kV according to patient size and/or use of iterative reconstruction technique. COMPARISON:  None Available. FINDINGS: Lower chest: Large sliding hiatal hernia containing the stomach. Left basilar atelectasis. Hepatobiliary: No focal liver abnormality is seen. Gallbladder is  contracted containing radiopaque gallstone. No radiographic evidence of cholecystitis. No biliary dilatation. Pancreas: Apparent prominence of the head of the pancreas measuring 4.9 x 4.1 cm (image 36/2). Spleen: Normal in size without focal abnormality. Adrenals/Urinary Tract: Adrenal glands are unremarkable. Kidneys are normal, without renal calculi, focal lesion, or hydronephrosis. Bladder is unremarkable. Stomach/Bowel: Large sliding hiatal hernia containing part of the stomach. Appendix appears normal. Apparent mild thickening of the ascending and transverse colonic wall. Severe diverticulosis of the sigmoid colon without diverticulitis. Vascular/Lymphatic: Aortic atherosclerosis. No enlarged abdominal or pelvic lymph nodes. Reproductive: Multiple uterine fibroids. Some of the fibroids show popcorn calcifications. Other: There is a complex, well encapsulated, partial wall calcified mass at the right mid abdomen measuring 12 cm AP by 15 cm transverse by 15.7 cm craniocaudad. The mass contains soft tissue and lipomatous components. There are small areas of coarse calcifications seen in the posterior aspect of this cystic mass. There is course calcification of the posterior wall of the mass seen (images 41 through 70 of series 5). Musculoskeletal: There is 1.2 cm nodular soft tissue density seen in the retroareolar mid breast region (image 2/2). With loss of the disc space, endplate marginal osteophytes and endplate sclerosis. IMPRESSION: 1. There is a complex, well encapsulated, partial wall calcified cystic mass in the right mid abdomen containing the fatty, soft tissue components and to a lesser extent small areas of calcifications consistent with mesenteric teratoma measuring approximately 12 x 15 x 15.7 cm. Recommend surgical consultation. 2. Large sliding-type hiatal hernia containing part of the stomach. The herniated stomach shows some thickening of the gastric wall. 3. Questionable mild thickening of the  ascending and transverse colonic wall. Severe diverticulosis of the sigmoid colon without diverticulitis. 4. Uterus is enlarged for the patient's age containing multiple fibroids with some of the fibroids showing popcorn calcifications. 5. Cholelithiasis in the contracted gallbladder without cholecystitis. 6. Apparent enlargement of the head of the pancreas measuring 4.9 x 4.1 cm. Recommend CT/MRI of the abdomen per the pancreatic mass protocol. 7.  Aortic atherosclerosis (ICD10-I70.0) 8. 1.2 cm nodular density in the retroareolar left breast. Correlation with ultrasound/mammogram is suggested. 9. Thoracolumbar spondylosis and is most prominent at L5-S1. At L5-S1, there is loss of the disc space, endplate sclerosis with sclerosis extending into the adjacent vertebral bodies, and prominent endplate osteophytes seen. These findings most probably on the basis of degeneration less likely infection. Clinical and laboratory correlation is suggested in the appropriate clinical setting. Electronically Signed   By: Frazier Richards M.D.   On: 05/20/2022 17:58   DG Chest 2 View  Result Date: 05/20/2022 CLINICAL DATA:  Cough, hypoxia EXAM: CHEST - 2 VIEW COMPARISON:  03/06/2022 FINDINGS: Transverse diameter of heart is increased. Left hemidiaphragm is elevated. Central pulmonary vessels are prominent. There is poor inspiration. Linear densities are seen in the lower lung fields, more so on the left side. IMPRESSION: Central pulmonary vessels  are prominent which may be due to poor inspiration. There are no signs of alveolar pulmonary edema. There are linear densities in both lower lung fields suggesting subsegmental atelectasis. Electronically Signed   By: Elmer Picker M.D.   On: 05/20/2022 17:40        Scheduled Meds:  carvedilol  25 mg Oral BID WC   enoxaparin (LOVENOX) injection  30 mg Subcutaneous Q24H   insulin aspart  0-15 Units Subcutaneous TID WC   pantoprazole  40 mg Oral Daily   sodium chloride  flush  3 mL Intravenous Q12H   Continuous Infusions:  sodium chloride       LOS: 0 days    Time spent: 35 minutes    Barb Merino, MD [facility] Pager [phone]

## 2022-05-21 NOTE — ED Notes (Signed)
Bgl 84

## 2022-05-22 ENCOUNTER — Inpatient Hospital Stay (HOSPITAL_COMMUNITY): Payer: Medicare PPO | Admitting: Anesthesiology

## 2022-05-22 ENCOUNTER — Encounter (HOSPITAL_COMMUNITY)
Admission: EM | Disposition: A | Discharge status: Home / Self Care | Payer: Self-pay | Source: Home / Self Care | Attending: Internal Medicine

## 2022-05-22 ENCOUNTER — Other Ambulatory Visit: Payer: Self-pay

## 2022-05-22 ENCOUNTER — Encounter (HOSPITAL_COMMUNITY): Payer: Self-pay | Admitting: Internal Medicine

## 2022-05-22 DIAGNOSIS — Z9889 Other specified postprocedural states: Secondary | ICD-10-CM

## 2022-05-22 DIAGNOSIS — K562 Volvulus: Secondary | ICD-10-CM | POA: Diagnosis not present

## 2022-05-22 DIAGNOSIS — K3189 Other diseases of stomach and duodenum: Secondary | ICD-10-CM

## 2022-05-22 DIAGNOSIS — I129 Hypertensive chronic kidney disease with stage 1 through stage 4 chronic kidney disease, or unspecified chronic kidney disease: Secondary | ICD-10-CM

## 2022-05-22 DIAGNOSIS — N1832 Chronic kidney disease, stage 3b: Secondary | ICD-10-CM

## 2022-05-22 DIAGNOSIS — E1122 Type 2 diabetes mellitus with diabetic chronic kidney disease: Secondary | ICD-10-CM

## 2022-05-22 DIAGNOSIS — N179 Acute kidney failure, unspecified: Secondary | ICD-10-CM

## 2022-05-22 DIAGNOSIS — Z7984 Long term (current) use of oral hypoglycemic drugs: Secondary | ICD-10-CM

## 2022-05-22 DIAGNOSIS — K440 Diaphragmatic hernia with obstruction, without gangrene: Secondary | ICD-10-CM

## 2022-05-22 HISTORY — PX: XI ROBOTIC ASSISTED HIATAL HERNIA REPAIR: SHX6889

## 2022-05-22 LAB — BASIC METABOLIC PANEL
Anion gap: 7 (ref 5–15)
BUN: 39 mg/dL — ABNORMAL HIGH (ref 8–23)
CO2: 28 mmol/L (ref 22–32)
Calcium: 9.2 mg/dL (ref 8.9–10.3)
Chloride: 108 mmol/L (ref 98–111)
Creatinine, Ser: 2.59 mg/dL — ABNORMAL HIGH (ref 0.44–1.00)
GFR, Estimated: 19 mL/min — ABNORMAL LOW (ref >60)
Glucose, Bld: 92 mg/dL (ref 70–99)
Potassium: 4 mmol/L (ref 3.5–5.1)
Sodium: 143 mmol/L (ref 135–145)

## 2022-05-22 LAB — GLUCOSE, CAPILLARY
Glucose-Capillary: 74 mg/dL (ref 70–99)
Glucose-Capillary: 67 mg/dL — ABNORMAL LOW (ref 70–99)
Glucose-Capillary: 150 mg/dL — ABNORMAL HIGH (ref 70–99)
Glucose-Capillary: 86 mg/dL (ref 70–99)
Glucose-Capillary: 93 mg/dL (ref 70–99)
Glucose-Capillary: 98 mg/dL (ref 70–99)
Glucose-Capillary: 113 mg/dL — ABNORMAL HIGH (ref 70–99)

## 2022-05-22 SURGERY — REPAIR, HERNIA, HIATAL, ROBOT-ASSISTED
Anesthesia: General | Site: Abdomen

## 2022-05-22 MED ORDER — METRONIDAZOLE 500 MG/100ML IV SOLN
INTRAVENOUS | Status: DC | PRN
  Administered 2022-05-22: 500 mg via INTRAVENOUS

## 2022-05-22 MED ORDER — MORPHINE SULFATE (PF) 2 MG/ML IV SOLN
1.0000 mg | INTRAVENOUS | Status: DC | PRN
  Administered 2022-05-25: 2 mg via INTRAVENOUS
  Filled 2022-05-22: qty 1

## 2022-05-22 MED ORDER — PHENYLEPHRINE HCL-NACL 20-0.9 MG/250ML-% IV SOLN
INTRAVENOUS | Status: DC | PRN
  Administered 2022-05-22: 40 ug/min via INTRAVENOUS

## 2022-05-22 MED ORDER — DEXTROSE 50 % IV SOLN
12.5000 g | INTRAVENOUS | Status: AC
  Administered 2022-05-22: 12.5 g via INTRAVENOUS
  Filled 2022-05-22: qty 50

## 2022-05-22 MED ORDER — PROPOFOL 10 MG/ML IV BOLUS
INTRAVENOUS | Status: DC | PRN
  Administered 2022-05-22: 200 mg via INTRAVENOUS

## 2022-05-22 MED ORDER — SODIUM CHLORIDE 0.9 % IV SOLN: 8.0000 mg | Freq: Four times a day (QID) | INTRAVENOUS | Status: DC | PRN

## 2022-05-22 MED ORDER — DEXAMETHASONE SODIUM PHOSPHATE 10 MG/ML IJ SOLN
INTRAMUSCULAR | Status: DC | PRN
  Administered 2022-05-22: 8 mg via INTRAVENOUS

## 2022-05-22 MED ORDER — 0.9 % SODIUM CHLORIDE (POUR BTL) OPTIME
TOPICAL | Status: DC | PRN
  Administered 2022-05-22: 2000 mL

## 2022-05-22 MED ORDER — PROCHLORPERAZINE EDISYLATE 10 MG/2ML IJ SOLN: 5.0000 mg | INTRAMUSCULAR | Status: DC | PRN

## 2022-05-22 MED ORDER — METOCLOPRAMIDE HCL 5 MG/ML IJ SOLN: 5.0000 mg | Freq: Three times a day (TID) | INTRAMUSCULAR | Status: DC | PRN

## 2022-05-22 MED ORDER — FENTANYL CITRATE PF 50 MCG/ML IJ SOSY: 25.0000 ug | PREFILLED_SYRINGE | INTRAMUSCULAR | Status: DC | PRN

## 2022-05-22 MED ORDER — FENTANYL CITRATE (PF) 250 MCG/5ML IJ SOLN
INTRAMUSCULAR | Status: AC
  Filled 2022-05-22: qty 5

## 2022-05-22 MED ORDER — BUPIVACAINE LIPOSOME 1.3 % IJ SUSP
INTRAMUSCULAR | Status: DC | PRN
  Administered 2022-05-22: 20 mL

## 2022-05-22 MED ORDER — ACETAMINOPHEN 500 MG PO TABS
ORAL_TABLET | ORAL | Status: AC
  Filled 2022-05-22: qty 2

## 2022-05-22 MED ORDER — PROPOFOL 10 MG/ML IV BOLUS
INTRAVENOUS | Status: AC
  Filled 2022-05-22: qty 20

## 2022-05-22 MED ORDER — PROMETHAZINE HCL 25 MG/ML IJ SOLN: 6.2500 mg | INTRAMUSCULAR | Status: DC | PRN

## 2022-05-22 MED ORDER — LIDOCAINE HCL (CARDIAC) PF 100 MG/5ML IV SOSY
PREFILLED_SYRINGE | INTRAVENOUS | Status: DC | PRN
  Administered 2022-05-22: 60 mg via INTRAVENOUS

## 2022-05-22 MED ORDER — BUPIVACAINE-EPINEPHRINE 0.5% -1:200000 IJ SOLN
INTRAMUSCULAR | Status: DC | PRN
  Administered 2022-05-22: 50 mL

## 2022-05-22 MED ORDER — SUGAMMADEX SODIUM 200 MG/2ML IV SOLN
INTRAVENOUS | Status: DC | PRN
  Administered 2022-05-22: 200 mg via INTRAVENOUS

## 2022-05-22 MED ORDER — SUCCINYLCHOLINE CHLORIDE 200 MG/10ML IV SOSY
PREFILLED_SYRINGE | INTRAVENOUS | Status: AC
  Filled 2022-05-22: qty 10

## 2022-05-22 MED ORDER — ACETAMINOPHEN 10 MG/ML IV SOLN
1000.0000 mg | Freq: Four times a day (QID) | INTRAVENOUS | Status: AC
  Administered 2022-05-22 – 2022-05-23 (×4): 1000 mg via INTRAVENOUS
  Filled 2022-05-22 (×4): qty 100

## 2022-05-22 MED ORDER — NALOXONE HCL 0.4 MG/ML IJ SOLN
INTRAMUSCULAR | Status: DC | PRN
  Administered 2022-05-22: 40 ug via INTRAVENOUS

## 2022-05-22 MED ORDER — BUPIVACAINE LIPOSOME 1.3 % IJ SUSP
INTRAMUSCULAR | Status: AC
  Filled 2022-05-22: qty 20

## 2022-05-22 MED ORDER — ONDANSETRON HCL 4 MG/2ML IJ SOLN
INTRAMUSCULAR | Status: DC | PRN
  Administered 2022-05-22: 4 mg via INTRAVENOUS

## 2022-05-22 MED ORDER — ROCURONIUM BROMIDE 10 MG/ML (PF) SYRINGE
PREFILLED_SYRINGE | INTRAVENOUS | Status: DC | PRN
  Administered 2022-05-22 (×2): 10 mg via INTRAVENOUS
  Administered 2022-05-22: 50 mg via INTRAVENOUS
  Administered 2022-05-22: 10 mg via INTRAVENOUS

## 2022-05-22 MED ORDER — LIDOCAINE HCL (PF) 2 % IJ SOLN
INTRAMUSCULAR | Status: AC
  Filled 2022-05-22: qty 5

## 2022-05-22 MED ORDER — PHENYLEPHRINE 80 MCG/ML (10ML) SYRINGE FOR IV PUSH (FOR BLOOD PRESSURE SUPPORT)
PREFILLED_SYRINGE | INTRAVENOUS | Status: DC | PRN
  Administered 2022-05-22: 80 ug via INTRAVENOUS
  Administered 2022-05-22: 160 ug via INTRAVENOUS
  Administered 2022-05-22: 320 ug via INTRAVENOUS
  Administered 2022-05-22 (×3): 160 ug via INTRAVENOUS
  Administered 2022-05-22: 80 ug via INTRAVENOUS

## 2022-05-22 MED ORDER — ONDANSETRON HCL 4 MG/2ML IJ SOLN
4.0000 mg | Freq: Four times a day (QID) | INTRAMUSCULAR | Status: DC | PRN
  Administered 2022-05-23 – 2022-05-24 (×2): 4 mg via INTRAVENOUS
  Filled 2022-05-22: qty 2

## 2022-05-22 MED ORDER — SUCCINYLCHOLINE CHLORIDE 200 MG/10ML IV SOSY
PREFILLED_SYRINGE | INTRAVENOUS | Status: DC | PRN
  Administered 2022-05-22: 120 mg via INTRAVENOUS

## 2022-05-22 MED ORDER — NALOXONE HCL 0.4 MG/ML IJ SOLN
INTRAMUSCULAR | Status: AC
  Filled 2022-05-22: qty 1

## 2022-05-22 MED ORDER — DEXTROSE 5 % IV SOLN
INTRAVENOUS | Status: DC | PRN
  Administered 2022-05-22: 2 g via INTRAVENOUS

## 2022-05-22 MED ORDER — ROCURONIUM BROMIDE 10 MG/ML (PF) SYRINGE
PREFILLED_SYRINGE | INTRAVENOUS | Status: AC
Start: 2022-05-22 — End: ?
  Filled 2022-05-22: qty 10

## 2022-05-22 MED ORDER — BUPIVACAINE-EPINEPHRINE 0.5% -1:200000 IJ SOLN
INTRAMUSCULAR | Status: AC
Start: 2022-05-22 — End: ?
  Filled 2022-05-22: qty 1

## 2022-05-22 MED ORDER — SCOPOLAMINE 1 MG/3DAYS TD PT72
MEDICATED_PATCH | TRANSDERMAL | Status: AC
  Administered 2022-05-22: 1.5 mg via TRANSDERMAL
  Filled 2022-05-22: qty 1

## 2022-05-22 MED ORDER — SODIUM CHLORIDE 0.9 % IV SOLN
INTRAVENOUS | Status: AC
  Filled 2022-05-22: qty 20

## 2022-05-22 MED ORDER — LACTATED RINGERS IV SOLN: INTRAVENOUS | Status: DC | PRN

## 2022-05-22 MED ORDER — FENTANYL CITRATE (PF) 250 MCG/5ML IJ SOLN
INTRAMUSCULAR | Status: DC | PRN
  Administered 2022-05-22 (×2): 100 ug via INTRAVENOUS
  Administered 2022-05-22: 50 ug via INTRAVENOUS

## 2022-05-22 MED ORDER — GABAPENTIN 300 MG PO CAPS
ORAL_CAPSULE | ORAL | Status: AC
  Filled 2022-05-22: qty 1

## 2022-05-22 MED ORDER — METRONIDAZOLE 500 MG/100ML IV SOLN
INTRAVENOUS | Status: AC
  Filled 2022-05-22: qty 100

## 2022-05-22 SURGICAL SUPPLY — 68 items
APL PRP STRL LF DISP 70% ISPRP (MISCELLANEOUS) ×1
APL SWBSTK 6 STRL LF DISP (MISCELLANEOUS) ×1
APPLICATOR COTTON TIP 6 STRL (MISCELLANEOUS) ×1 IMPLANT
APPLICATOR COTTON TIP 6IN STRL (MISCELLANEOUS) ×1 IMPLANT
APPLIER CLIP 5 13 M/L LIGAMAX5 (MISCELLANEOUS)
APR CLP MED LRG 5 ANG JAW (MISCELLANEOUS)
BAG COUNTER SPONGE SURGICOUNT (BAG) ×1 IMPLANT
BAG SPNG CNTER NS LX DISP (BAG) ×1
BLADE SURG SZ11 CARB STEEL (BLADE) ×1 IMPLANT
CHLORAPREP W/TINT 26 (MISCELLANEOUS) ×1 IMPLANT
CLIP APPLIE 5 13 M/L LIGAMAX5 (MISCELLANEOUS) IMPLANT
COVER SURGICAL LIGHT HANDLE (MISCELLANEOUS) ×1 IMPLANT
COVER TIP SHEARS 8 DVNC (MISCELLANEOUS) IMPLANT
COVER TIP SHEARS 8MM DA VINCI (MISCELLANEOUS)
DRAIN CHANNEL 19F RND (DRAIN) IMPLANT
DRAIN PENROSE 0.5X18 (DRAIN) IMPLANT
DRAPE ARM DVNC X/XI (DISPOSABLE) ×4 IMPLANT
DRAPE COLUMN DVNC XI (DISPOSABLE) ×1 IMPLANT
DRAPE DA VINCI XI ARM (DISPOSABLE) ×4
DRAPE DA VINCI XI COLUMN (DISPOSABLE) ×1
DRAPE WARM FLUID 44X44 (DRAPES) ×1 IMPLANT
DRSG TEGADERM 2-3/8X2-3/4 SM (GAUZE/BANDAGES/DRESSINGS) ×5 IMPLANT
ELECT REM PT RETURN 15FT ADLT (MISCELLANEOUS) ×1 IMPLANT
ENDOLOOP SUT PDS II  0 18 (SUTURE)
ENDOLOOP SUT PDS II 0 18 (SUTURE) IMPLANT
EVACUATOR DRAINAGE 10X20 100CC (DRAIN) IMPLANT
EVACUATOR SILICONE 100CC (DRAIN) IMPLANT
FELT TEFLON 4 X1 (Mesh General) ×1 IMPLANT
GAUZE SPONGE 2X2 8PLY STRL LF (GAUZE/BANDAGES/DRESSINGS) ×1 IMPLANT
GLOVE ECLIPSE 8.0 STRL XLNG CF (GLOVE) ×2 IMPLANT
GLOVE INDICATOR 8.0 STRL GRN (GLOVE) ×2 IMPLANT
GOWN STRL REUS W/ TWL XL LVL3 (GOWN DISPOSABLE) ×4 IMPLANT
GOWN STRL REUS W/TWL XL LVL3 (GOWN DISPOSABLE) ×4
GRASPER SUT TROCAR 14GX15 (MISCELLANEOUS) IMPLANT
IRRIG SUCT STRYKERFLOW 2 WTIP (MISCELLANEOUS) ×1
IRRIGATION SUCT STRKRFLW 2 WTP (MISCELLANEOUS) ×1 IMPLANT
KIT BASIN OR (CUSTOM PROCEDURE TRAY) ×1 IMPLANT
KIT TURNOVER KIT A (KITS) IMPLANT
MESH PHASIX RESORB RECT 10X15 (Mesh General) IMPLANT
NEEDLE HYPO 22GX1.5 SAFETY (NEEDLE) ×1 IMPLANT
PACK CARDIOVASCULAR III (CUSTOM PROCEDURE TRAY) ×1 IMPLANT
PAD POSITIONING PINK XL (MISCELLANEOUS) ×1 IMPLANT
PENCIL SMOKE EVACUATOR (MISCELLANEOUS) IMPLANT
SCISSORS LAP 5X45 EPIX DISP (ENDOMECHANICALS) IMPLANT
SEAL CANN UNIV 5-8 DVNC XI (MISCELLANEOUS) ×4 IMPLANT
SEAL XI 5MM-8MM UNIVERSAL (MISCELLANEOUS) ×4
SEALER VESSEL DA VINCI XI (MISCELLANEOUS) ×1
SEALER VESSEL EXT DVNC XI (MISCELLANEOUS) ×1 IMPLANT
SOLUTION ELECTROLUBE (MISCELLANEOUS) ×1 IMPLANT
SPIKE FLUID TRANSFER (MISCELLANEOUS) ×1 IMPLANT
STOPCOCK 4 WAY LG BORE MALE ST (IV SETS) ×2 IMPLANT
SUT ETHIBOND 0 36 GRN (SUTURE) ×2 IMPLANT
SUT ETHIBOND NAB CT1 #1 30IN (SUTURE) ×3 IMPLANT
SUT MNCRL AB 4-0 PS2 18 (SUTURE) ×1 IMPLANT
SUT PROLENE 2 0 SH DA (SUTURE) IMPLANT
SUT VICRYL 0 TIES 12 18 (SUTURE) IMPLANT
SUT VICRYL 0 UR6 27IN ABS (SUTURE) IMPLANT
SUT VLOC 180 0 9IN  GS21 (SUTURE) ×2
SUT VLOC 180 0 9IN GS21 (SUTURE) IMPLANT
SUT VLOC 180 2-0 9IN GS21 (SUTURE) IMPLANT
SYR 10ML LL (SYRINGE) ×1 IMPLANT
SYR 20ML LL LF (SYRINGE) ×1 IMPLANT
TOWEL OR 17X26 10 PK STRL BLUE (TOWEL DISPOSABLE) ×1 IMPLANT
TOWEL OR NON WOVEN STRL DISP B (DISPOSABLE) ×1 IMPLANT
TRAY FOLEY MTR SLVR 14FR STAT (SET/KITS/TRAYS/PACK) IMPLANT
TRAY FOLEY MTR SLVR 16FR STAT (SET/KITS/TRAYS/PACK) IMPLANT
TROCAR ADV FIXATION 5X100MM (TROCAR) ×1 IMPLANT
TUBING INSUFFLATION 10FT LAP (TUBING) ×1 IMPLANT

## 2022-05-22 NOTE — Interval H&P Note (Signed)
History and Physical Interval Note:  05/22/2022 10:57 AM  [name]  has presented today for surgery, with the diagnosis of Incercerated hiatal hernia.  The various methods of treatment have been discussed with the patient and family. After consideration of risks, benefits and other options for treatment, the patient has consented to  Procedure(s): XI ROBOTIC [place] (N/A) as a surgical intervention.  The patient's history has been reviewed, patient examined, no change in status, stable for surgery.  I have reviewed the patient's chart and labs.  Questions were answered to the patient's satisfaction.    I have re-reviewed the the patient's records, history, medications, and allergies.  I have re-examined the patient.  I again discussed intraoperative plans and goals of post-operative recovery.  The patient agrees to proceed.  [name]  1951/07/15 [id number]  Patient Care Team: Shary Key, DO as PCP - General (Family Medicine) End, Harrell Gave, MD as PCP - Cardiology (Cardiology) Wilford Corner, MD as Consulting Physician (Gastroenterology)  Patient Active Problem List   Diagnosis Date Noted   Abdominal mass 05/20/2022    Priority: High   Incarcerated hiatal hernia 05/21/2022    Priority: Medium    Acute kidney injury (AKI) with acute tubular necrosis (ATN) ([place]) 05/21/2022   Acute renal failure superimposed on stage 3a chronic kidney disease ([place]) 05/20/2022   Breast mass, left 06/27/2020   Obstructive sleep apnea 01/05/2020   Edema    GERD (gastroesophageal reflux disease)    Vitamin D deficiency 12/29/2019   Hyperlipidemia associated with type 2 diabetes mellitus ([place]) 12/29/2019   Allergic asthma, mild intermittent, uncomplicated 83/15/1761   Ductal carcinoma in situ (DCIS) of right breast 04/12/2017   Moderate osteopenia 02/21/2015   Thalassemia 02/21/2015   Diplopia 11/07/2014   Stage 3a chronic kidney disease (CKD) ([place]) 05/24/2014    History of breast cancer 05/18/2014   Allergic rhinitis 05/18/2014   Diabetes mellitus ([place]) 05/18/2014   Class 3 severe obesity with serious comorbidity and body mass index (BMI) of 45.0 to 49.9 in adult ([place]) 05/15/2014   Breast cancer, right breast ([place]) 03/16/2012   Iron deficiency anemia 05/22/2011   Essential hypertension 05/22/2011    Past Medical History:  Diagnosis Date   Abnormal electrocardiogram (ECG) (EKG)    Anemia    Arthritis    Asthma    Back pain    Bilateral swelling of feet    Blood transfusion 03/14/2012   Bradycardia    Breast cancer ([place]) 03/16/2012   right lumpectomy=high grade ductal ca in situ,ER/PR=positive   Cancer ([place])    right breast ductal carcinoma in situ   Chronic kidney disease    stage III    Diabetes mellitus    type 2   Dyspnea    Edema    GERD (gastroesophageal reflux disease)    Hiatal hernia    large   Hypercholesterolemia    Hypertension    Echocardiogram 03/2011: EF 60-73%, grade 1 diastolic dysfunction, mild MAC, mild LAE   Iron deficiency anemia    Joint pain    Morbid obesity ([place])    Other fatigue    Personal history of radiation therapy    S/P radiation therapy 05/16/12 - 06/29/12   Right Breast: 50 Gray/ 25 Fractions with Boost: 10 Gray/ 5 Fractions   Shortness of breath    with exertion   Shortness of breath on exertion    Sinus drainage    Sleep apnea    Total  knee replacement status 05/15/2014   Use of anastrozole (Arimidex) Started 10/13   Vitamin D deficiency     Past Surgical History:  Procedure Laterality Date   Biospy Right Breast  02/01/12   Right Breast Needle Core Biopsy: ductal Carcinoma In situ with Papillary Features, ER/PR Positive   BREAST EXCISIONAL BIOPSY     left 1998   BREAST LUMPECTOMY  04/1997   Lumpectomy Right Breast  03/16/12   Ductal carcinoma In situ: clear margins: 0/1 Node Negative   TOTAL KNEE ARTHROPLASTY Right 05/15/2014   Procedure: RIGHT TOTAL KNEE ARTHROPLASTY;  Surgeon: Tobi Bastos, MD;  Location: [facility];  Service: Orthopedics;  Laterality: Right;    Social History   Socioeconomic History   Marital status: Married    Spouse name: [name]   Number of children: 1   Years of education: 16   Highest education level: Bachelor's degree (e.g., BA, AB, BS)  Occupational History    Employer: [employer]    Comment: 2 days per week   Tobacco Use   Smoking status: Never   Smokeless tobacco: Never  Vaping Use   Vaping Use: Never used  Substance and Sexual Activity   Alcohol use: No   Drug use: No   Sexual activity: Not Currently    Partners: Male    Comment: menarche age 71,last menses age 10,g1,p1,first live birth age 60  Other Topics Concern   Not on file  Social History Narrative   Patient lives with her husband in [place].    Patient has one son and he lives in [place], [place].   Patient is active in her church and has a close set of friends.   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    Patient is active at the healthy [facility].   Social Determinants of Health   Financial Resource Strain: Low Risk  (03/04/2021)   Overall Financial Resource Strain (CARDIA)    Difficulty of Paying Living Expenses: Not hard at all  Food Insecurity: No Food Insecurity (03/04/2021)   Hunger Vital Sign    Worried About Running Out of Food in the Last Year: Never true    Ran Out of Food in the Last Year: Never true  Transportation Needs: No Transportation Needs (03/04/2021)   PRAPARE - Hydrologist (Medical): No    Lack of Transportation (Non-Medical): No  Physical Activity: Inactive (03/04/2021)   Exercise Vital Sign    Days of Exercise per Week: 0 days    Minutes of Exercise per Session: 0 min  Stress: Stress Concern Present (03/04/2021)   [place]    Feeling of Stress : To some extent  Social Connections: Socially Integrated (03/04/2021)    Social Connection and Isolation Panel [NHANES]    Frequency of Communication with Friends and Family: More than three times a week    Frequency of Social Gatherings with Friends and Family: More than three times a week    Attends Religious Services: More than 4 times per year    Active Member of [employer] or Organizations: Yes    Attends Music therapist: More than 4 times per year    Marital Status: Married  Human resources officer Violence: Not At Risk (03/04/2021)   Humiliation, Afraid, Rape, and Kick questionnaire    Fear of Current or Ex-Partner: No    Emotionally Abused: No    Physically  Abused: No    Sexually Abused: No    Family History  Problem Relation Age of Onset   Hypertension Mother    Diabetes Mother    Heart attack Father 24   Heart disease Father    Sudden death Father    Breast cancer Sister 76   Lymphoma Sister        Deceased at age 72    Medications Prior to Admission  Medication Sig Dispense Refill Last Dose   carvedilol (COREG) 25 MG tablet Take 1 tablet (25 mg total) by mouth 2 (two) times daily with a meal. 180 tablet 3 05/20/2022 at 1100   chlorthalidone (HYGROTON) 25 MG tablet Take 25 mg by mouth daily.   05/20/2022   cholecalciferol (VITAMIN D) 1000 units tablet Take 1,000 Units by mouth daily.   05/20/2022   Cholecalciferol (VITAMIN D) 50 MCG (2000 UT) tablet Take 2,000 Units by mouth daily.   05/20/2022   JARDIANCE 10 MG TABS tablet Take 10 mg by mouth daily.   05/20/2022   KERENDIA 20 MG TABS Take 1 tablet by mouth daily.   05/20/2022   losartan (COZAAR) 100 MG tablet Take 100 mg by mouth daily.   05/20/2022   omeprazole (PRILOSEC) 40 MG capsule Take 40 mg by mouth 2 (two) times daily.   05/20/2022   ondansetron (ZOFRAN) 4 MG tablet Take 4 mg by mouth 3 (three) times daily as needed for nausea or vomiting.   05/20/2022   albuterol (PROAIR HFA) 108 (90 Base) MCG/ACT inhaler Inhale 2 puffs into the lungs every 6 (six) hours as needed for wheezing or shortness  of breath. (Patient not taking: Reported on 05/20/2022) 18 g 5 Not Taking   BREO ELLIPTA 200-25 MCG/INH AEPB TAKE 1 PUFF BY MOUTH EVERY DAY (Patient not taking: Reported on 05/20/2022) 60 each 11 Not Taking   glucose blood (GNP TRUE METRIX GLUCOSE STRIPS) test strip Use as instructed 100 each 12    Vitamin D, Ergocalciferol, (DRISDOL) 1.25 MG (50000 UNIT) CAPS capsule Take 1 capsule (50,000 Units total) by mouth every 7 (seven) days. (Patient not taking: Reported on 06/23/2021) 4 capsule 0 Completed Course    Current Facility-Administered Medications  Medication Dose Route Frequency Provider Last Rate Last Admin   0.9 %  sodium chloride infusion   Intravenous Continuous Barb Merino, MD 125 mL/hr at 05/22/22 0931 Continued from Pre-op at 05/22/22 0931   [MAR Hold] acetaminophen (TYLENOL) tablet 650 mg  650 mg Oral Q6H PRN Marcelyn Bruins, MD       Or   Doug Sou Hold] acetaminophen (TYLENOL) suppository 650 mg  650 mg Rectal Q6H PRN Marcelyn Bruins, MD       [MAR Hold] albuterol (PROVENTIL) (2.5 MG/3ML) 0.083% nebulizer solution 3 mL  3 mL Inhalation Q6H PRN Marcelyn Bruins, MD       bupivacaine liposome (EXPAREL) 1.3 % injection 266 mg  20 mL Infiltration Once Michael Boston, MD       [facility] Hold] carvedilol (COREG) tablet 25 mg  25 mg Oral BID WC Marcelyn Bruins, MD   25 mg at 05/22/22 8182   Chlorhexidine Gluconate Cloth 2 % PADS 6 each  6 each Topical Once Michael Boston, MD       [facility] Hold] enoxaparin (LOVENOX) injection 30 mg  30 mg Subcutaneous Q24H Marcelyn Bruins, MD   30 mg at 05/21/22 2243   feeding supplement (ENSURE PRE-SURGERY) liquid 592 mL  592 mL Oral Once Michael Boston,  MD       [MAR Hold] hydrALAZINE (APRESOLINE) tablet 25 mg  25 mg Oral Q6H PRN Opyd, Ilene Qua, MD       [MAR Hold] insulin aspart (novoLOG) injection 0-15 Units  0-15 Units Subcutaneous TID WC Marcelyn Bruins, MD       metroNIDAZOLE (FLAGYL) 500 MG/100ML IVPB            [MAR Hold] ondansetron  (ZOFRAN) tablet 4 mg  4 mg Oral Q6H PRN Marcelyn Bruins, MD       Or   Doug Sou Hold] ondansetron [facility]) injection 4 mg  4 mg Intravenous Q6H PRN Marcelyn Bruins, MD   4 mg at 05/22/22 0331   [MAR Hold] pantoprazole (PROTONIX) EC tablet 40 mg  40 mg Oral Daily Marcelyn Bruins, MD   40 mg at 05/22/22 0838   [MAR Hold] polyethylene glycol (MIRALAX / GLYCOLAX) packet 17 g  17 g Oral Daily PRN Marcelyn Bruins, MD       scopolamine (TRANSDERM-SCOP) 1 MG/3DAYS 1.5 mg  1 patch Transdermal On Call to OR Michael Boston, MD   1.5 mg at 05/22/22 0950   sodium chloride 0.9 % with cefTRIAXone (ROCEPHIN) ADS Med            [facility] Hold] sodium chloride flush (NS) 0.9 % injection 3 mL  3 mL Intravenous Q12H Marcelyn Bruins, MD   3 mL at 05/21/22 2221     Allergies  Allergen Reactions   Nsaids Other (See Comments)    Chronic kidney disease   Diprivan [Propofol]     Oxygen Levels Lowered    BP (!) 165/93 (BP Location: Left Arm)   Pulse 89   Temp 98 F (36.7 C) (Oral)   Resp 18   Ht 5' 2.5" (1.588 m)   Wt 116.9 kg   SpO2 96%   BMI 46.39 kg/m   Labs: Results for orders placed or performed during the hospital encounter of 05/20/22 (from the past 48 hour(s))  CBC with Differential     Status: Abnormal   Collection Time: 05/20/22  4:59 PM  Result Value Ref Range   WBC 5.8 4.0 - 10.5 K/uL   RBC 5.25 (H) 3.87 - 5.11 MIL/uL   Hemoglobin 12.7 12.0 - 15.0 g/dL   HCT 43.6 36.0 - 46.0 %   MCV 83.0 80.0 - 100.0 fL   MCH 24.2 (L) 26.0 - 34.0 pg   MCHC 29.1 (L) 30.0 - 36.0 g/dL   RDW 15.5 11.5 - 15.5 %   Platelets 208 150 - 400 K/uL   nRBC 0.0 0.0 - 0.2 %   Neutrophils Relative % 73 %   Neutro Abs 4.3 1.7 - 7.7 K/uL   Lymphocytes Relative 14 %   Lymphs Abs 0.8 0.7 - 4.0 K/uL   Monocytes Relative 8 %   Monocytes Absolute 0.5 0.1 - 1.0 K/uL   Eosinophils Relative 4 %   Eosinophils Absolute 0.2 0.0 - 0.5 K/uL   Basophils Relative 1 %   Basophils Absolute 0.0 0.0 - 0.1 K/uL   Immature  Granulocytes 0 %   Abs Immature Granulocytes 0.01 0.00 - 0.07 K/uL    Comment: Performed at [facility], [place] [address] [place], [place] [address]  Comprehensive metabolic panel     Status: Abnormal   Collection Time: 05/20/22  4:59 PM  Result Value Ref Range   Sodium 141 135 - 145 mmol/L   Potassium 4.0 3.5 - 5.1  mmol/L   Chloride 100 98 - 111 mmol/L   CO2 31 22 - 32 mmol/L   Glucose, Bld 109 (H) 70 - 99 mg/dL    Comment: Glucose reference range applies only to samples taken after fasting for at least 8 hours.   BUN 54 (H) 8 - 23 mg/dL   Creatinine, Ser 3.73 (H) 0.44 - 1.00 mg/dL   Calcium 10.1 8.9 - 10.3 mg/dL   Total Protein 7.4 6.5 - 8.1 g/dL   Albumin 3.6 3.5 - 5.0 g/dL   AST 14 (L) 15 - 41 U/L   ALT 15 0 - 44 U/L   Alkaline Phosphatase 61 38 - 126 U/L   Total Bilirubin 0.4 0.3 - 1.2 mg/dL   GFR, Estimated 12 (L) >60 mL/min    Comment: (NOTE) Calculated using the CKD-EPI Creatinine Equation (2021)    Anion gap 10 5 - 15    Comment: Performed at [facility], [place] [address], [place], [place] [address]  Lipase, blood     Status: None   Collection Time: 05/20/22  4:59 PM  Result Value Ref Range   Lipase 44 11 - 51 U/L    Comment: Performed at [facility], [place] [address], [place], [place] [address]  SARS Coronavirus 2 by RT PCR (hospital order, performed in [facility] hospital lab) *cepheid single result test* Anterior Nasal Swab     Status: None   Collection Time: 05/20/22  5:00 PM   Specimen: Anterior Nasal Swab  Result Value Ref Range   SARS Coronavirus 2 by RT PCR NEGATIVE NEGATIVE    Comment: (NOTE) SARS-CoV-2 target nucleic acids are NOT DETECTED.  The SARS-CoV-2 RNA is generally detectable in upper and lower respiratory specimens during the acute phase of infection. The lowest concentration of SARS-CoV-2 viral copies this assay can detect is 250 copies / mL. A negative result does not preclude SARS-CoV-2  infection and should not be used as the sole basis for treatment or other patient management decisions.  A negative result may occur with improper specimen collection / handling, submission of specimen other than nasopharyngeal swab, presence of viral mutation(s) within the areas targeted by this assay, and inadequate number of viral copies (<250 copies / mL). A negative result must be combined with clinical observations, patient history, and epidemiological information.  Fact Sheet for Patients:   https://www.patel.info/  Fact Sheet for Healthcare Providers: https://hall.com/  This test is not yet approved or  cleared by the [place] FDA and has been authorized for detection and/or diagnosis of SARS-CoV-2 by FDA under an Emergency Use Authorization (EUA).  This EUA will remain in effect (meaning this test can be used) for the duration of the COVID-19 declaration under Section 564(b)(1) of the Act, 21 U.S.C. section 360bbb-3(b)(1), unless the authorization is terminated or revoked sooner.  Performed at [facility], [place] [address] [place], [place] [address]   Urinalysis, Routine w reflex microscopic Urine, Clean Catch     Status: Abnormal   Collection Time: 05/20/22  6:06 PM  Result Value Ref Range   Color, Urine YELLOW YELLOW   APPearance HAZY (A) CLEAR   Specific Gravity, Urine 1.011 1.005 - 1.030   pH 5.0 5.0 - 8.0   Glucose, UA >=500 (A) NEGATIVE mg/dL   Hgb urine dipstick SMALL (A) NEGATIVE   Bilirubin Urine NEGATIVE NEGATIVE   Ketones, ur NEGATIVE NEGATIVE mg/dL   Protein, ur NEGATIVE NEGATIVE mg/dL   Nitrite NEGATIVE NEGATIVE  Leukocytes,Ua NEGATIVE NEGATIVE   RBC / HPF 0-5 0 - 5 RBC/hpf   WBC, UA 0-5 0 - 5 WBC/hpf   Bacteria, UA MANY (A) NONE SEEN   Squamous Epithelial / LPF 0-5 0 - 5   Hyaline Casts, UA PRESENT     Comment: Performed at [facility], [place] [address]  [place], [place] [address]  CBG monitoring, ED     Status: None   Collection Time: 05/20/22 10:01 PM  Result Value Ref Range   Glucose-Capillary 80 70 - 99 mg/dL    Comment: Glucose reference range applies only to samples taken after fasting for at least 8 hours.  Comprehensive metabolic panel     Status: Abnormal   Collection Time: 05/21/22  5:00 AM  Result Value Ref Range   Sodium 143 135 - 145 mmol/L   Potassium 4.4 3.5 - 5.1 mmol/L    Comment: HEMOLYSIS AT THIS LEVEL MAY AFFECT RESULT   Chloride 109 98 - 111 mmol/L   CO2 28 22 - 32 mmol/L   Glucose, Bld 87 70 - 99 mg/dL    Comment: Glucose reference range applies only to samples taken after fasting for at least 8 hours.   BUN 46 (H) 8 - 23 mg/dL   Creatinine, Ser 3.16 (H) 0.44 - 1.00 mg/dL   Calcium 8.8 (L) 8.9 - 10.3 mg/dL   Total Protein 6.5 6.5 - 8.1 g/dL   Albumin 3.1 (L) 3.5 - 5.0 g/dL   AST 24 15 - 41 U/L    Comment: HEMOLYSIS AT THIS LEVEL MAY AFFECT RESULT   ALT 11 0 - 44 U/L    Comment: HEMOLYSIS AT THIS LEVEL MAY AFFECT RESULT   Alkaline Phosphatase 51 38 - 126 U/L   Total Bilirubin 1.3 (H) 0.3 - 1.2 mg/dL    Comment: HEMOLYSIS AT THIS LEVEL MAY AFFECT RESULT   GFR, Estimated 15 (L) >60 mL/min    Comment: (NOTE) Calculated using the CKD-EPI Creatinine Equation (2021)    Anion gap 6 5 - 15    Comment: Performed at [facility], [place] [address], [place], [place] [address]  CBC     Status: Abnormal   Collection Time: 05/21/22  5:00 AM  Result Value Ref Range   WBC 4.9 4.0 - 10.5 K/uL   RBC 4.87 3.87 - 5.11 MIL/uL   Hemoglobin 11.9 (L) 12.0 - 15.0 g/dL   HCT 40.8 36.0 - 46.0 %   MCV 83.8 80.0 - 100.0 fL   MCH 24.4 (L) 26.0 - 34.0 pg   MCHC 29.2 (L) 30.0 - 36.0 g/dL   RDW 15.6 (H) 11.5 - 15.5 %   Platelets 211 150 - 400 K/uL   nRBC 0.0 0.0 - 0.2 %    Comment: Performed at [facility], [place] [address] [place], [place] [address]  CBG monitoring, ED     Status: None   Collection  Time: 05/21/22 12:11 PM  Result Value Ref Range   Glucose-Capillary 84 70 - 99 mg/dL    Comment: Glucose reference range applies only to samples taken after fasting for at least 8 hours.  Glucose, capillary     Status: None   Collection Time: 05/21/22  6:21 PM  Result Value Ref Range   Glucose-Capillary 74 70 - 99 mg/dL    Comment: Glucose reference range applies only to samples taken after fasting for at least 8 hours.  Glucose, capillary     Status: Abnormal  Collection Time: 05/22/22  1:01 AM  Result Value Ref Range   Glucose-Capillary 67 (L) 70 - 99 mg/dL    Comment: Glucose reference range applies only to samples taken after fasting for at least 8 hours.   Comment 1 Notify RN    Comment 2 Document in Chart   Glucose, capillary     Status: Abnormal   Collection Time: 05/22/22  1:58 AM  Result Value Ref Range   Glucose-Capillary 150 (H) 70 - 99 mg/dL    Comment: Glucose reference range applies only to samples taken after fasting for at least 8 hours.   Comment 1 Notify RN    Comment 2 Document in Chart   Glucose, capillary     Status: None   Collection Time: 05/22/22  5:26 AM  Result Value Ref Range   Glucose-Capillary 86 70 - 99 mg/dL    Comment: Glucose reference range applies only to samples taken after fasting for at least 8 hours.  Basic metabolic panel     Status: Abnormal   Collection Time: 05/22/22  5:27 AM  Result Value Ref Range   Sodium 143 135 - 145 mmol/L   Potassium 4.0 3.5 - 5.1 mmol/L   Chloride 108 98 - 111 mmol/L   CO2 28 22 - 32 mmol/L   Glucose, Bld 92 70 - 99 mg/dL    Comment: Glucose reference range applies only to samples taken after fasting for at least 8 hours.   BUN 39 (H) 8 - 23 mg/dL   Creatinine, Ser 2.59 (H) 0.44 - 1.00 mg/dL   Calcium 9.2 8.9 - 10.3 mg/dL   GFR, Estimated 19 (L) >60 mL/min    Comment: (NOTE) Calculated using the CKD-EPI Creatinine Equation (2021)    Anion gap 7 5 - 15    Comment: Performed at [facility], [place] [address], [place], [place] [address]  Glucose, capillary     Status: None   Collection Time: 05/22/22  8:35 AM  Result Value Ref Range   Glucose-Capillary 93 70 - 99 mg/dL    Comment: Glucose reference range applies only to samples taken after fasting for at least 8 hours.  Glucose, capillary     Status: None   Collection Time: 05/22/22  9:18 AM  Result Value Ref Range   Glucose-Capillary 98 70 - 99 mg/dL    Comment: Glucose reference range applies only to samples taken after fasting for at least 8 hours.    Imaging / Studies: CT ABDOMEN PELVIS WO CONTRAST  Result Date: 05/20/2022 CLINICAL DATA:  Nausea/vomiting, AKI EXAM: CT ABDOMEN AND PELVIS WITHOUT CONTRAST TECHNIQUE: Multidetector CT imaging of the abdomen and pelvis was performed following the standard protocol without IV contrast. RADIATION DOSE REDUCTION: This exam was performed according to the departmental dose-optimization program which includes automated exposure control, adjustment of the mA and/or kV according to patient size and/or use of iterative reconstruction technique. COMPARISON:  None Available. FINDINGS: Lower chest: Large sliding hiatal hernia containing the stomach. Left basilar atelectasis. Hepatobiliary: No focal liver abnormality is seen. Gallbladder is contracted containing radiopaque gallstone. No radiographic evidence of cholecystitis. No biliary dilatation. Pancreas: Apparent prominence of the head of the pancreas measuring 4.9 x 4.1 cm (image 36/2). Spleen: Normal in size without focal abnormality. Adrenals/Urinary Tract: Adrenal glands are unremarkable. Kidneys are normal, without renal calculi, focal lesion, or hydronephrosis. Bladder is unremarkable. Stomach/Bowel: Large sliding hiatal hernia containing part of the stomach. Appendix appears normal. Apparent mild thickening of the  ascending and transverse colonic wall. Severe diverticulosis of the sigmoid colon without diverticulitis.  Vascular/Lymphatic: Aortic atherosclerosis. No enlarged abdominal or pelvic lymph nodes. Reproductive: Multiple uterine fibroids. Some of the fibroids show popcorn calcifications. Other: There is a complex, well encapsulated, partial wall calcified mass at the right mid abdomen measuring 12 cm AP by 15 cm transverse by 15.7 cm craniocaudad. The mass contains soft tissue and lipomatous components. There are small areas of coarse calcifications seen in the posterior aspect of this cystic mass. There is course calcification of the posterior wall of the mass seen (images 41 through 70 of series 5). Musculoskeletal: There is 1.2 cm nodular soft tissue density seen in the retroareolar mid breast region (image 2/2). With loss of the disc space, endplate marginal osteophytes and endplate sclerosis. IMPRESSION: 1. There is a complex, well encapsulated, partial wall calcified cystic mass in the right mid abdomen containing the fatty, soft tissue components and to a lesser extent small areas of calcifications consistent with mesenteric teratoma measuring approximately 12 x 15 x 15.7 cm. Recommend surgical consultation. 2. Large sliding-type hiatal hernia containing part of the stomach. The herniated stomach shows some thickening of the gastric wall. 3. Questionable mild thickening of the ascending and transverse colonic wall. Severe diverticulosis of the sigmoid colon without diverticulitis. 4. Uterus is enlarged for the patient's age containing multiple fibroids with some of the fibroids showing popcorn calcifications. 5. Cholelithiasis in the contracted gallbladder without cholecystitis. 6. Apparent enlargement of the head of the pancreas measuring 4.9 x 4.1 cm. Recommend CT/MRI of the abdomen per the pancreatic mass protocol. 7.  Aortic atherosclerosis (ICD10-I70.0) 8. 1.2 cm nodular density in the retroareolar left breast. Correlation with ultrasound/mammogram is suggested. 9. Thoracolumbar spondylosis and is most  prominent at L5-S1. At L5-S1, there is loss of the disc space, endplate sclerosis with sclerosis extending into the adjacent vertebral bodies, and prominent endplate osteophytes seen. These findings most probably on the basis of degeneration less likely infection. Clinical and laboratory correlation is suggested in the appropriate clinical setting. Electronically Signed   By: Frazier Richards M.D.   On: 05/20/2022 17:58   DG Chest 2 View  Result Date: 05/20/2022 CLINICAL DATA:  Cough, hypoxia EXAM: CHEST - 2 VIEW COMPARISON:  03/06/2022 FINDINGS: Transverse diameter of heart is increased. Left hemidiaphragm is elevated. Central pulmonary vessels are prominent. There is poor inspiration. Linear densities are seen in the lower lung fields, more so on the left side. IMPRESSION: Central pulmonary vessels are prominent which may be due to poor inspiration. There are no signs of alveolar pulmonary edema. There are linear densities in both lower lung fields suggesting subsegmental atelectasis. Electronically Signed   By: Elmer Picker M.D.   On: 05/20/2022 17:40     .Adin Hector, M.D., F.A.C.S. Gastrointestinal and Minimally Invasive Surgery [facility] [address] [name], [name] [id number] [phone] Main / Paging  05/22/2022 10:57 AM    Adin Hector

## 2022-05-22 NOTE — Anesthesia Procedure Notes (Signed)
Procedure Name: Intubation Date/Time: 05/22/2022 12:28 PM  Performed by: Raenette Rover, CRNAPre-anesthesia Checklist: Patient identified, Emergency Drugs available, Suction available and Patient being monitored Patient Re-evaluated:Patient Re-evaluated prior to induction Oxygen Delivery Method: Circle system utilized Preoxygenation: Pre-oxygenation with 100% oxygen Induction Type: IV induction, Rapid sequence and Cricoid Pressure applied Laryngoscope Size: Mac and 3 Grade View: Grade I Tube type: Oral Tube size: 7.0 mm Number of attempts: 1 Airway Equipment and Method: Stylet Placement Confirmation: ETT inserted through vocal cords under direct vision, positive ETCO2 and breath sounds checked- equal and bilateral Secured at: 21 cm Tube secured with: Tape Dental Injury: Teeth and Oropharynx as per pre-operative assessment

## 2022-05-22 NOTE — Anesthesia Postprocedure Evaluation (Signed)
Anesthesia Post Note  Patient: [name]  Procedure(s) Performed: XI ROBOTIC ASSISTED HIATAL HERNIA REPAIR WITH FUNDOPLICATION AND MESH REINFORCEMENT (Abdomen)     Patient location during evaluation: PACU Anesthesia Type: General Level of consciousness: awake and alert Pain management: pain level controlled Vital Signs Assessment: post-procedure vital signs reviewed and stable Respiratory status: spontaneous breathing, nonlabored ventilation, respiratory function stable and patient connected to nasal cannula oxygen Cardiovascular status: blood pressure returned to baseline and stable Postop Assessment: no apparent nausea or vomiting Anesthetic complications: no   No notable events documented.  Last Vitals:  Vitals:   05/22/22 1815 05/22/22 1848  BP: (!) 143/74 135/78  Pulse: 75 73  Resp: 17 (!) 22  Temp:  36.5 C  SpO2: 95% 95%    Last Pain:  Vitals:   05/22/22 1848  TempSrc: Oral  PainSc:                  Tiajuana Amass

## 2022-05-22 NOTE — Discharge Instructions (Signed)
EATING AFTER YOUR ESOPHAGEAL SURGERY (Stomach Fundoplication, Hiatal Hernia repair, Achalasia surgery, etc)  ######################################################################  EAT Start with a pureed / full liquid diet (see below) Gradually transition to a high fiber diet with a fiber supplement over the next month after discharge.    WALK Walk an hour a day.  Control your pain to do that.    CONTROL PAIN Control pain so that you can walk, sleep, tolerate sneezing/coughing, go up/down stairs.  HAVE A BOWEL MOVEMENT DAILY Keep your bowels regular to avoid problems.  OK to try a laxative to override constipation.  OK to use an antidairrheal to slow down diarrhea.  Call if not better after 2 tries  CALL IF YOU HAVE PROBLEMS/CONCERNS Call if you are still struggling despite following these instructions. Call if you have concerns not answered by these instructions  ######################################################################   After your esophageal surgery, expect some sticking with swallowing over the next 1-2 months.    If food sticks when you eat, it is called "dysphagia".  This is due to swelling around your esophagus at the wrap & hiatal diaphragm repair.  It will gradually ease off over the next few months.  To help you through this temporary phase, we start you out on a pureed (blenderized) diet.  Your first meal in the hospital was thin liquids.  You should have been given a pureed diet by the time you left the hospital.  We ask patients to stay on a pureed diet for the first 2-3 weeks to avoid anything getting "stuck" near your recent surgery.  Don't be alarmed if your ability to swallow doesn't progress according to this plan.  Everyone is different and some diets can advance more or less quickly.    It is often helpful to crush your medications or split them as they can sometimes stick, especially the first week or so.   Some BASIC RULES to follow  are:  Maintain an upright position whenever eating or drinking.  Take small bites - just a teaspoon size bite at a time.  Eat slowly.  It may also help to eat only one food at a time.  Consider nibbling through smaller, more frequent meals & avoid the urge to eat BIG meals  Do not push through feelings of fullness, nausea, or bloatedness  Do not mix solid foods and liquids in the same mouthful  Try not to "wash foods down" with large gulps of liquids.  Avoid carbonated (bubbly/fizzy) drinks.    Avoid foods that make you feel gassy or bloated.  Start with bland foods first.  Wait on trying greasy, fried, or spicy meals until you are tolerating more bland solids well.  Understand that it will be hard to burp and belch at first.  This gradually improves with time.  Expect to be more gassy/flatulent/bloated initially.  Walking will help your body manage it better.  Consider using medications for bloating that contain simethicone such as  Maalox or Gas-X   Consider crushing her medications, especially smaller pills.  The ability to swallow pills should get easier after a few weeks  Eat in a relaxed atmosphere & minimize distractions.  Avoid talking while eating.    Do not use straws.  Following each meal, sit in an upright position (90 degree angle) for 60 to 90 minutes.  Going for a short walk can help as well  If food does stick, don't panic.  Try to relax and let the food pass on its own.    Be gradual in changes & use common sense:  -If you easily tolerating a certain "level" of foods, advance to the next level gradually -If you are having trouble swallowing a particular food, then avoid it.   -If food is sticking when you advance your diet, go back to thinner previous diet (the lower LEVEL) for 1-2 days.  LEVEL 1 = PUREED DIET  Do for the first 2 WEEKS AFTER SURGERY  -Foods in this group are pureed or blenderized to a smooth, mashed  potato-like consistency.  -If necessary, the pureed foods can keep their shape with the addition of a thickening agent.   -Meat should be pureed to a smooth, pasty consistency.  Hot broth or gravy may be added to the pureed meat, approximately 1 oz. of liquid per 3 oz. serving of meat. -CAUTION:  If any foods do not puree into a smooth consistency, swallowing will be more difficult.  (For example, nuts or seeds sometimes do not blend well.)  Hot Foods Cold Foods  Pureed scrambled eggs and cheese Pureed cottage cheese  Baby cereals Thickened juices and nectars  Thinned cooked cereals (no lumps) Thickened milk or eggnog  Pureed [place] toast or pancakes Ensure  Mashed potatoes Ice cream  Pureed parsley, au gratin, scalloped potatoes, candied sweet potatoes Fruit or [place] ice, sherbet  Pureed buttered or alfredo noodles Plain yogurt  Pureed vegetables (no corn or peas) Instant breakfast  Pureed soups and creamed soups Smooth pudding, mousse, custard  Pureed scalloped apples Whipped gelatin  Gravies Sugar, syrup, honey, jelly  Sauces, cheese, tomato, barbecue, white, creamed Cream  Any baby food Creamer  Alcohol in moderation (not beer or champagne) Margarine  Coffee or tea Mayonnaise   Ketchup, mustard   Apple sauce   SAMPLE MENU:  PUREED DIET Breakfast Lunch Dinner  Orange juice, 1/2 cup Cream of wheat, 1/2 cup Pineapple juice, 1/2 cup Pureed [place], barley soup, 3/4 cup Pureed Hawaiian chicken, 3 oz  Scrambled eggs, mashed or blended with cheese, 1/2 cup Tea or coffee, 1 cup  Whole milk, 1 cup  Non-dairy creamer, 2 Tbsp. Mashed potatoes, 1/2 cup Pureed cooled broccoli, 1/2 cup Apple sauce, 1/2 cup Coffee or tea Mashed potatoes, 1/2 cup Pureed spinach, 1/2 cup Frozen yogurt, 1/2 cup Tea or coffee      LEVEL 2 = SOFT DIET  After your first 2 weeks, you can advance to a soft diet.   Keep on this diet until everything goes down easily.  Hot Foods Cold Foods  White fish  Cottage cheese  Stuffed fish Junior baby fruit  Baby food meals Semi thickened juices  Minced soft cooked, scrambled, poached eggs nectars  Souffle & omelets Ripe mashed bananas  Cooked cereals Canned fruit, pineapple sauce, milk  potatoes Milkshake  Buttered or Alfredo noodles Custard  Cooked cooled vegetable Puddings, including tapioca  Sherbet Yogurt  Vegetable soup or alphabet soup Fruit ice, [place] ice  Gravies Whipped gelatin  Sugar, syrup, honey, jelly Junior baby desserts  Sauces:  Cheese, creamed, barbecue, tomato, white Cream  Coffee or tea Margarine   SAMPLE MENU:  LEVEL 2 Breakfast Lunch Dinner  Orange juice, 1/2 cup Oatmeal, 1/2 cup Scrambled eggs with cheese, 1/2 cup Decaffeinated tea, 1 cup Whole milk, 1 cup Non-dairy creamer, 2 Tbsp Pineapple juice, 1/2 cup Minced beef, 3 oz Gravy, 2 Tbsp Mashed potatoes, 1/2 cup Minced fresh broccoli, 1/2 cup Applesauce, 1/2 cup Coffee, 1 cup [place], barley soup, 3/4 cup Minced Hawaiian chicken, 3 oz  Non-dairy creamer, 2 Tbsp  Pineapple juice, 1/2 cup  Minced beef, 3 oz  Gravy, 2 Tbsp  Mashed potatoes, 1/2 cup  Minced fresh broccoli, 1/2 cup  Applesauce, 1/2 cup  Coffee, 1 cup  [place], barley soup, 3/4 cup  Minced Hawaiian chicken, 3 oz  Mashed potatoes, 1/2 cup  Cooked spinach, 1/2 cup  Frozen yogurt, 1/2 cup  Non-dairy creamer, 2 Tbsp      LEVEL 3 = CHOPPED DIET  -After all the foods in level 2 (soft diet) are passing through well you should advance up to more chopped foods.  -It is still important to cut these foods into small pieces and eat slowly.  Hot Foods Cold Foods  Poultry Cottage cheese  Chopped Swedish meatballs Yogurt  Meat salads (ground or flaked meat) Milk  Flaked fish (tuna) Milkshakes  Poached or scrambled eggs Soft, cold, dry cereal  Souffles and omelets Fruit juices or nectars  Cooked cereals Chopped canned fruit  Chopped French toast or pancakes Canned fruit cocktail  Noodles or pasta (no rice) Pudding, mousse, custard  Cooked vegetables (no frozen peas, corn, or mixed vegetables) Green salad  Canned small sweet peas  Ice cream  Creamed soup or vegetable soup Fruit ice, Italian ice  Pureed vegetable soup or alphabet soup Non-dairy creamer  Ground scalloped apples Margarine  Gravies Mayonnaise  Sauces:  Cheese, creamed, barbecue, tomato, white Ketchup  Coffee or tea Mustard   SAMPLE MENU:  LEVEL 3 Breakfast Lunch Dinner   Orange juice, 1/2 cup  Oatmeal, 1/2 cup  Scrambled eggs with cheese, 1/2 cup  Decaffeinated tea, 1 cup  Whole milk, 1 cup  Non-dairy creamer, 2 Tbsp  Ketchup, 1 Tbsp  Margarine, 1 tsp  Salt, 1/4 tsp  Sugar, 2 tsp  Pineapple juice, 1/2 cup  Ground beef, 3 oz  Gravy, 2 Tbsp  Mashed potatoes, 1/2 cup  Cooked spinach, 1/2 cup  Applesauce, 1/2 cup  Decaffeinated coffee  Whole milk  Non-dairy creamer, 2 Tbsp  Margarine, 1 tsp  Salt, 1/4 tsp  Pureed turkey, barley soup, 3/4 cup  Barbecue chicken, 3 oz  Mashed potatoes, 1/2 cup  Ground fresh broccoli, 1/2 cup  Frozen yogurt, 1/2 cup  Decaffeinated tea, 1 cup  Non-dairy creamer, 2 Tbsp  Margarine, 1 tsp  Salt, 1/4 tsp  Sugar, 1 tsp    LEVEL 4:  REGULAR FOODS  -Foods in this group are soft, moist, regularly textured foods.   -This level includes meat and breads, which tend to be the hardest things to swallow.   -Eat very slowly, chew well and continue to avoid carbonated drinks. -most people are at this level in 4-6 weeks  Hot Foods Cold Foods  Baked fish or skinned Soft cheeses - cottage cheese  Souffles and omelets Cream cheese  Eggs Yogurt  Stuffed shells Milk  Spaghetti with meat sauce Milkshakes  Cooked cereal Cold dry cereals (no nuts, dried fruit, coconut)  French toast or pancakes Crackers  Buttered toast Fruit juices or nectars  Noodles or pasta (no rice) Canned fruit  Potatoes (all types) Ripe bananas  Soft, cooked vegetables (no corn, lima, or baked beans) Peeled, ripe, fresh fruit  Creamed soups or vegetable soup Cakes (no nuts, dried fruit, coconut)  Canned chicken  noodle soup Plain doughnuts  Gravies Ice cream  Bacon dressing Pudding, mousse, custard  Sauces:  Cheese, creamed, barbecue, tomato, white Fruit ice, Italian ice, sherbet  Decaffeinated tea or coffee Whipped gelatin  Pork chops Regular gelatin     as needed  6) May hold gluten/wheat products from diet to see if symptoms improve.  7) May try probiotics (Align, Activa, etc) to help calm the bowels down  7) If symptoms become worse call back immediately.    If you have any questions please call our office at [facility]: [phone].     ################################################################  LAPAROSCOPIC SURGERY: POST OP INSTRUCTIONS  ######################################################################  EAT Gradually transition to a high fiber diet with a fiber supplement over the next few weeks after discharge.  Start with a pureed / full liquid diet (see below)  WALK Walk an hour a day.  Control your pain to do that.    CONTROL PAIN Control pain so that you can walk, sleep, tolerate sneezing/coughing, go up/down stairs.  HAVE A BOWEL MOVEMENT DAILY Keep your bowels regular to avoid problems.  OK to try a laxative to override constipation.  OK to use an antidairrheal to slow down diarrhea.  Call if not better after 2 tries  CALL IF YOU HAVE PROBLEMS/CONCERNS Call if you are still struggling despite following these instructions. Call if you have concerns not answered by these instructions  ######################################################################    DIET: Follow a light bland diet & liquids the first 24 hours after arrival home, such as soup, liquids,  starches, etc.  Be sure to drink plenty of fluids.  Quickly advance to a usual solid diet within a few days.  Avoid fast food or heavy meals as your are more likely to get nauseated or have irregular bowels.  A low-fat, high-fiber diet for the rest of your life is ideal.  Take your usually prescribed home medications unless otherwise directed.  PAIN CONTROL: Pain is best controlled by a usual combination of three different methods TOGETHER: Ice/Heat Over the counter pain medication Prescription pain medication Most patients will experience some swelling and bruising around the incisions.  Ice packs or heating pads (30-60 minutes up to 6 times a day) will help. Use ice for the first few days to help decrease swelling and bruising, then switch to heat to help relax tight/sore spots and speed recovery.  Some people prefer to use ice alone, heat alone, alternating between ice & heat.  Experiment to what works for you.  Swelling and bruising can take several weeks to resolve.   It is helpful to take an over-the-counter pain medication regularly for the first few weeks.  Choose one of the following that works best for you: Naproxen (Aleve, etc)  Two 220mg tabs twice a day Ibuprofen (Advil, etc) Three 200mg tabs four times a day (every meal & bedtime) Acetaminophen (Tylenol, etc) 500-650mg four times a day (every meal & bedtime) A  prescription for pain medication (such as oxycodone, hydrocodone, tramadol, gabapentin, methocarbamol, etc) should be given to you upon discharge.  Take your pain medication as prescribed.  If you are having problems/concerns with the prescription medicine (does not control pain, nausea, vomiting, rash, itching, etc), please call us [phone] to see if we need to switch you to a different pain medicine that will work better for you and/or control your side effect better. If you need a refill on your pain medication, please give us 48 hour notice.  contact your pharmacy.   They will contact our office to request authorization. Prescriptions will not be filled after 5 pm or on week-ends  Avoid getting constipated.   Between the surgery and the pain medications, it is common to experience some constipation.   Increasing fluid   intake and taking a fiber supplement (such as Metamucil, Citrucel, FiberCon, MiraLax, etc) 1-2 times a day regularly will usually help prevent this problem from occurring.   A mild laxative (prune juice, Milk of Magnesia, MiraLax, etc) should be taken according to package directions if there are no bowel movements after 48 hours.   Watch out for diarrhea.   If you have many loose bowel movements, simplify your diet to bland foods & liquids for a few days.   Stop any stool softeners and decrease your fiber supplement.   Switching to mild anti-diarrheal medications (Kayopectate, Pepto Bismol) can help.   If this worsens or does not improve, please call us.  Wash / shower every day.  You may shower over the dressings as they are waterproof.  Continue to shower over incision(s) after the dressing is off.  It is good for closed incisions and even open wounds to be washed every day.  Shower every day.  Short baths are fine.  Wash the incisions and wounds clean with soap & water.    You may leave closed incisions open to air if it is dry.   You may cover the incision with clean gauze & replace it after your daily shower for comfort.  TEGADERM:  You have clear gauze band-aid dressings over your closed incision(s).  Remove the dressings 3 days after surgery.    ACTIVITIES as tolerated:   You may resume regular (light) daily activities beginning the next day--such as daily self-care, walking, climbing stairs--gradually increasing activities as tolerated.  If you can walk 30 minutes without difficulty, it is safe to try more intense activity such as jogging, treadmill, bicycling, low-impact aerobics, swimming, etc. Save the most intensive and strenuous  activity for last such as sit-ups, heavy lifting, contact sports, etc  Refrain from any heavy lifting or straining until you are off narcotics for pain control.   DO NOT PUSH THROUGH PAIN.  Let pain be your guide: If it hurts to do something, don't do it.  Pain is your body warning you to avoid that activity for another week until the pain goes down. You may drive when you are no longer taking prescription pain medication, you can comfortably wear a seatbelt, and you can safely maneuver your car and apply brakes. You may have sexual intercourse when it is comfortable.  FOLLOW UP in our office Please call [facility] at [phone] to set up an appointment to see your surgeon in the office for a follow-up appointment approximately 2-3 weeks after your surgery. Make sure that you call for this appointment the day you arrive home to insure a convenient appointment time.  10. IF YOU HAVE DISABILITY OR FAMILY LEAVE FORMS, BRING THEM TO THE OFFICE FOR PROCESSING.  DO NOT GIVE THEM TO YOUR DOCTOR.   WHEN TO CALL US [phone]: Poor pain control Reactions / problems with new medications (rash/itching, nausea, etc)  Fever over 101.5 F (38.5 C) Inability to urinate Nausea and/or vomiting Worsening swelling or bruising Continued bleeding from incision. Increased pain, redness, or drainage from the incision   The clinic staff is available to answer your questions during regular business hours (8:30am-5pm).  Please don't hesitate to call and ask to speak to one of our nurses for clinical concerns.   If you have a medical emergency, go to the nearest emergency room or call 911.  A surgeon from [facility] is always on call at the hospitals   [facility]   [address], [address], [place], [place]  [address] ? MAIN: [phone] ? TOLL FREE: [phone] ?  FAX [phone] [email or web address]  ##############################################################  

## 2022-05-22 NOTE — Transfer of Care (Signed)
Immediate Anesthesia Transfer of Care Note  Patient: [name]  Procedure(s) Performed: XI ROBOTIC ASSISTED HIATAL HERNIA REPAIR WITH FUNDOPLICATION AND MESH REINFORCEMENT (Abdomen)  Patient Location: PACU  Anesthesia Type:General  Level of Consciousness: awake, alert , oriented and patient cooperative  Airway & Oxygen Therapy: Patient Spontanous Breathing and Patient connected to face mask oxygen  Post-op Assessment: Report given to RN and Post -op Vital signs reviewed and stable  Post vital signs: Reviewed and stable  Last Vitals:  Vitals Value Taken Time  BP 108/66 05/22/22 1630  Temp 36.4 C 05/22/22 1630  Pulse 85 05/22/22 1643  Resp 20 05/22/22 1643  SpO2 94 % 05/22/22 1643  Vitals shown include unvalidated device data.  Last Pain:  Vitals:   05/22/22 0942  TempSrc:   PainSc: 0-No pain         Complications: No notable events documented.

## 2022-05-22 NOTE — Anesthesia Preprocedure Evaluation (Signed)
Anesthesia Evaluation  Patient identified by MRN, date of birth, ID band Patient awake    Reviewed: Allergy & Precautions, NPO status , Patient's Chart, lab work & pertinent test results  Airway Mallampati: II  TM Distance: >3 FB     Dental  (+) Dental Advisory Given   Pulmonary asthma , sleep apnea ,    breath sounds clear to auscultation       Cardiovascular hypertension, Pt. on medications and Pt. on home beta blockers  Rhythm:Regular Rate:Normal     Neuro/Psych negative neurological ROS     GI/Hepatic Neg liver ROS, hiatal hernia, GERD  Medicated,  Endo/Other  diabetes, Type 2Morbid obesity  Renal/GU CRF and ARFRenal disease     Musculoskeletal  (+) Arthritis ,   Abdominal   Peds  Hematology   Anesthesia Other Findings   Reproductive/Obstetrics                             Lab Results  Component Value Date   WBC 4.9 05/21/2022   HGB 11.9 (L) 05/21/2022   HCT 40.8 05/21/2022   MCV 83.8 05/21/2022   PLT 211 05/21/2022   Lab Results  Component Value Date   CREATININE 2.59 (H) 05/22/2022   BUN 39 (H) 05/22/2022   NA 143 05/22/2022   K 4.0 05/22/2022   CL 108 05/22/2022   CO2 28 05/22/2022    Anesthesia Physical Anesthesia Plan  ASA: 3  Anesthesia Plan: General   Post-op Pain Management: Ofirmev IV (intra-op)*   Induction: Intravenous, Rapid sequence and Cricoid pressure planned  PONV Risk Score and Plan: 3 and Dexamethasone, Ondansetron and Treatment may vary due to age or medical condition  Airway Management Planned: Oral ETT  Additional Equipment:   Intra-op Plan:   Post-operative Plan: Extubation in OR  Informed Consent: I have reviewed the patients History and Physical, chart, labs and discussed the procedure including the risks, benefits and alternatives for the proposed anesthesia with the patient or authorized representative who has indicated his/her  understanding and acceptance.     Dental advisory given  Plan Discussed with: CRNA  Anesthesia Plan Comments:         Anesthesia Quick Evaluation

## 2022-05-22 NOTE — Progress Notes (Signed)
Initial Nutrition Assessment  DOCUMENTATION CODES:   Morbid obesity  INTERVENTION:   -Once diet advanced, recommend Ensure Plus High Protein po BID, each supplement provides 350 kcal and 20 grams of protein.   NUTRITION DIAGNOSIS:   Increased nutrient needs related to post-op healing as evidenced by estimated needs.  GOAL:   Patient will meet greater than or equal to 90% of their needs  MONITOR:   Diet advancement, Labs, Weight trends, I & O's  REASON FOR ASSESSMENT:   Malnutrition Screening Tool    ASSESSMENT:   71 year old with history of hypertension, type 2 diabetes on insulin, CKD stage IIIa with baseline creatinine about 1.6-1.9, hyperlipidemia, history of breast cancer, GERD, untreated sleep apnea presented to the emergency room with abnormal labs.  Patient currently in OR for planned hiatal hernia repair. NPO for surgery.  Per chart review, pt has been having N/V for a few days and indigestion affecting appetite and PO intakes.  Once diet is advanced, recommend Ensure supplements for additional kcals and protein to aid in post-op recovery.   Per weight records, pt's weight has been trending up since 2022.  Medications: IV Zofran  Labs reviewed: CBGs: 67-150   NUTRITION - FOCUSED PHYSICAL EXAM:  Unable to complete, in OR  Diet Order:   Diet Order             Diet NPO time specified Except for: Ice Chips, Sips with Meds  Diet effective now                   EDUCATION NEEDS:   No education needs have been identified at this time  Skin:  Skin Assessment: Reviewed RN Assessment  Last BM:  8/22  Height:   Ht Readings from Last 1 Encounters:  05/22/22 5' 2.5" (1.588 m)    Weight:   Wt Readings from Last 1 Encounters:  05/22/22 116.9 kg    BMI:  Body mass index is 46.39 kg/m.  Estimated Nutritional Needs:   Kcal:  1500-1700  Protein:  70-85g  Fluid:  1.7l/day  Clayton Bibles, MS, RD, LDN Inpatient Clinical Dietitian Contact  information available via Amion

## 2022-05-22 NOTE — Op Note (Signed)
05/20/2022 - 05/22/2022  4:15 PM  PATIENT:  [name]  71 y.o. female  Patient Care Team: Shary Key, DO as PCP - General (Family Medicine) End, Harrell Gave, MD as PCP - Cardiology (Cardiology) Wilford Corner, MD as Consulting Physician (Gastroenterology)  PRE-OPERATIVE DIAGNOSIS:  Incarcerated hiatal hernia  POST-OPERATIVE DIAGNOSIS:  Incarcerated hiatal hernia with volvulus and obstruction  PROCEDURE:   1. ROBOTIC reduction of paraesophageal hiatal hernia 2. Type II mediastinal dissection. 2.5 right and left diaphragm partial component separation/diaphragm release 3. Primary repair of hiatal hernia over pledgets.  4. Anterior & posterior gastropexy. 5. Toupet (partial posterior 270deg)fundoplication x3 cm  6. Mesh reinforcement with absorbable mesh  SURGEON:  Adin Hector, MD  ASSIST:  OR Staff  ANESTHESIA:   local and general  EBL:  Total I/O In: 2658.8 [I.V.:2558.8; IV Piggyback:100] Out: 750 [Urine:350; Other:250; Blood:150]  Delay start of Pharmacological VTE agent (>24hrs) due to surgical blood loss or risk of bleeding:  no  ANESTHESIA: 1. General anesthesia. 2. Local anesthetic in a field block around all port sites.  SPECIMEN:  Mediastinal hernia sac (not sent).  DRAINS:  A 19-French Blake drain goes from the right upper quadrant along the lesser curvature of the stomach into the mediastinum.  COUNTS:  YES  PLAN OF CARE: Admit to inpatient   PATIENT DISPOSITION:  PACU - hemodynamically stable.  INDICATION:   Patient with symptomatic paraesophageal hiatal hernia.  Worsening symptoms of cramping and nausea and vomiting.  Concern for some twisting.  Was due to see a general surgery when she had intractable nausea and vomiting.  Came in severely hypokalemic and dehydrated.  Completely obstructed.  She was admitted and hydrated.  I recommended urgent hiatal hernia reduction and repair:  The anatomy & physiology of the foregut and anti-reflux  mechanism was discussed.  The pathophysiology of hiatal herniation and GERD was discussed.  Natural history risks without surgery was discussed.   The patient's symptoms are not adequately controlled by medicines and other non-operative treatments.  I feel the risks of no intervention will lead to serious problems that outweigh the operative risks; therefore, I recommended surgery to reduce the hiatal hernia out of the chest and fundoplication to rebuild the anti-reflux valve and control reflux better.  Need for a thorough workup to rule out the differential diagnosis and plan treatment was explained.  I explained laparoscopic techniques with possible need for an open approach.  Risks such as bleeding, infection, abscess, leak, need for further treatment, heart attack, death, and other risks were discussed.   I noted a good likelihood this will help address the problem.  Goals of post-operative recovery were discussed as well.  Possibility that this will not correct all symptoms was explained.  Post-operative dysphagia, need for short-term liquid & pureed diet, inability to vomit, possibility of reherniation, possible need for medicines to help control symptoms in addition to surgery were discussed.  We will work to minimize complications.   Educational handouts further explaining the pathology, treatment options, and dysphagia diet was given as well.  Questions were answered.  The patient expresses understanding & wishes to proceed with surgery.  OR FINDINGS:   Moderate-sized paraesophageal hiatal hernia with 100% of the stomach in the mediastinum.  There was a 9 x 8 cm hiatal defect.  It is a primary repair over pledgets. Mesh reinforcement was used with Mesh was used: PhasixT Mesh (a knitted monofilament mesh scaffold using Poly-4-hydroxybutyrate (P4HB), a biologically derived, fully resorbable material)  The patient has a partial posterior 270 degree Toupet fundoplication x3 cm.  The patient has had  anterior and posterior gastropexy.  DESCRIPTION:   Informed consent was confirmed.  The patient received IV antibiotics prior to incision.  The underwent general anesthesia without difficulty.  A Foley catheter sterilely placed.  The patient was positioned in split leg with arms tucked. The abdomen was prepped and draped in the sterile fashion.  Surgical time-out confirmed our plan.  I placed a 8 mm port in the left subcostal region using Varess entry technique with the patient in steep reverse Trendelenburg and left side up.  Entry was clean.  We induced carbon dioxide insufflation.  Camera inspection revealed no injury.  Under direct visualization, I placed extra ports.  I also placed a 5 mm port in the left subxiphoid region under direct visualization.  I removed that and placed an Omega-shaped rigid Nathanson liver retractor to lift the left lateral sector of the liver anteriorly to expose the esophageal hiatus.  This was secured to the bed using the iron man system.  The Xi robot was carefully docked and instruments placed and advanced under direct visualization.  We focused on dissection.  Entirety of the stomach was up in the hiatal hernia defect.  It was not particularly giant which most likely explain the incarceration.  We grasped the anterior mediastinal sac at the apex of the crus.  I scored through that and got into the anterior mediastinum.  I was able to free the mediastinal sac from its attachments to the pericardium and bilateral pleura using primarily focused gentle blunt dissection as well as focused vessel sealer dissection.  I transected phrenoesophageal attachments to the inner right crus, preserving a two centimeter cuff of mediastinal sac until I found the base of the crura.  Patient clearly had a replaced right hepatic artery.  I skeletonized that but protected that.  Also had a prominent left gastric artery as well that had been stretched out to the skeletonized and preserved.  That  made dissection a little more challenging given morbid obesity and involved arteries.  But eventually we were able to free things off.  I then came around anteriorly on the left side and freed up the phrenoesophageal attachments of the mediastinal sac on the medial part of the left crus on the superior half.  I did careful mediastinal dissection to free the mediastinal sac.  With that, we could relieve the suction cup affect of the hernia sac and help reduce the stomach back down into the abdomen.  It had clearly twisted toward the lesser sac and an organoaxial volvulus.  It looked somewhat ischemic at first.  However once untwisted and flipped back it perked up and seemed better.  No point of necrosis nor perforation..  Her stomach was quite dilated up.  We are able to advance the orogastric tube and to better decompress the stomach.  It had very fecalized material within the stomach and some coffee grounds consistent with some gastritis.  We ligated the short gastrics along the lesser curvature of the stomach about a third the way down and then came up proximally over the fundus.  We released the attachments of the stomach to the retroperitoneum until we were able to connect with the prior dissection on the left crus.  We completed the release of phrenoesophageal attachments to the medial part of the left crus down to its base.  With this, we had circumferential mobilization.    We  placed the stomach and esophagus on axial tension.  I then did a Type II mediastinal dissection where I freed the esophagus from its attachments to the aorta, spine, pleura, and pericardium using primarily gentle blunt as well as focused ultrasonic dissection.  We saw the anterior & posterior vagus nerves intact.  We preserved it at all times.  I procedded to dissect about 20 cm proximally into the mediastinum.  With that I could straighten out the esophagus and get 4 cm of intra-abdominal length of the esophagus at a best  estimation.  I freed the anterior mediastinal sac off the esophagus & stomach.  We saw the anterior vagus nerve and freed the sac off of the vagus.  I dissected out & removed the fatty  epiphrenic pads at the esophagogastric junction. With that, I could better define the esophagogastric junction.  I confirmed the the patient had 4 cm of intra-abdominal esophageal length off tension.  There is some thickness and tension so I did a left diaphragmatic partial component separation by doing a release on the left diaphragm about 6 cm lateral to the crus in the sagittal plane.  That gave 2 cm of relaxation and allow the left crura to come together better.  Did similar relaxation on the right anterior crus parallel by 1 cm lateral to the IVC.  That offered some relaxation as well.  That allowed the crura to come together better.  I brought the fundus of the stomach posterior to the esophagus over to the right side.  The wrap was mobile with the classic shoe shine maneuver.  Wrap became together gently.  We reflected the stomach left laterally and closed the esophageal hiatus using 0 Ethibond stitch using horizontal mattress stitches with pledgets on both sides.  I did that x2 stitches.  The crura had good substance and they came together well without any tension.  Because of the larger defect in a morbidly obese woman, I reinforced the repair using a 15 x 10 cm absorbable Phasix mesh.  I cut out a 2x4 cm part of mesh in the middle third of the mesh such that the mesh had a broad U shape transversely, one tail 5 cm wide & the other 8cm.  We brought the mesh in and laid it over the crural repair, tails anterior over the crura.  I tucked the broader "U" tail of the mesh between the left diaphragm and the spleen, the narrower "U" tail over the right crus.  I then secured the posterior & anterior corners of the narrower"U" tail with 0 V lock rated absorbable suture on the right side of the mesh to the right crus.  I then  did similar suturing on the left posterior corner of the mesh cephalad to where the spleen usually rested and came around upper interrupted suture to the right crus.  I secured to the left lateral and left superior sides of the broader "U" tail to the left diaphragm band with 0 V lock running suture.   I brought the fundus of the stomach behind the esophagus and cardia to set up a fundoplication wrap.  I did a posterior gastropexy by taking of #1 Ethibond stitches to the posterior part of the right side of the wrap and thru the Phasix mesh and crural closure.  I placed a similar stitch on the left anterior side as well.  That way the stomach covered the mesh and protected it from any esophageal exposure.  With the anterior and  posterior gastropexy's, stomach laid well for a fundoplication wrap.   I then did a classic 4cm Toupet fundoplication on the true esophagus above the cardia using 0 Ethibond stitch in the left superior side of the wrap, apex of the left inner crus then left anterior esophagus and tied that down to do a left anterior gastropexy.  Did a mirror-image stitch on the right side to do a right anterior gastropexy.  I then did 2 more distal pairs of suture between the inner part of the wrap and the anterolateral esophagus.  That way there were 3 total pairs of fundoplication sutures offering a posterior 270 degree wrap.  Measured at 3 cm.  Classic Toupet fundoplication.    The wrap was soft and floppy.  I placed a drain as noted above.  Removed the epiploic fat pads and hernia sacs.  All needles had been removed and counts were correct.  I did irrigation and ensured hemostasis.  I saw no evidence of any leak or perforation or other abnormality.  I removed the [facility] liver retractor under direct visualization.  I evacuated carbon dioxide and removed the ports.  The skin was closed with Monocryl and sterile dressings applied.  The patient is being extubated and brought back to the recovery  room.  I discussed postop care in detail with the patient and family in in the office.  Discussed again with the patient in the holding area.  I discussed operative findings, updated the patient's status, discussed probable steps to recovery, and gave postoperative recommendations to the patient's spouse, [name].  Recommendations were made.  Questions were answered.  He expressed understanding & appreciation.   Adin Hector, M.D., F.A.C.S. Gastrointestinal and Minimally Invasive Surgery [facility] [address] [place], [place] [id number] [phone] Main / Paging

## 2022-05-22 NOTE — Progress Notes (Signed)
Pt unable to tolerate cpap with NG tube in. Agreed to hold off until it comes out.

## 2022-05-22 NOTE — Progress Notes (Signed)
PROGRESS NOTE    [name]  TDH:[id number]  DOB: 1951/06/07  DOA: 05/20/2022 PCP: Shary Key, DO Outpatient Specialists:   Hospital course:  71 year old with history of hypertension, type 2 diabetes on insulin, CKD stage IIIa with baseline creatinine about 1.6-1.9, hyperlipidemia, history of breast cancer, GERD, untreated sleep apnea presented to the emergency room with abnormal labs.  Patient was scheduled for a CT scan, lab test showed greatly elevated creatinine so she was sent to the emergency room.  In the emergency room hemodynamically stable.  Creatinine 3.73.  CT scan of the abdomen pelvis with complex encapsulated teratoma like mass on the right mid abdomen, large hiatal hernia, enlarged pancreatic head.  Admitted with significant symptoms and surgery consulted  Subjective:  Patient in OR in PACU all day.  Unable to provide history.   Objective: Vitals:   05/22/22 1645 05/22/22 1700 05/22/22 1715 05/22/22 1730  BP: 131/71 (!) 142/86 (!) 143/86 (!) 153/80  Pulse: 84 79 78 77  Resp: '19 20 20 20  '$ Temp:      TempSrc:      SpO2: 95% 96% 93% 95%  Weight:      Height:        Intake/Output Summary (Last 24 hours) at 05/22/2022 1752 Last data filed at 05/22/2022 1616 Gross per 24 hour  Intake 5385.16 ml  Output 800 ml  Net 4585.16 ml   Filed Weights   05/20/22 1621 05/22/22 0700  Weight: 99.6 kg 116.9 kg     Exam:  General: Sleepy patient lying in bed in NAD s/p anesthesia CVS: S1-S2, regular  Respiratory:  decreased air entry bilaterally secondary to decreased inspiratory effort, rales at bases  GI: CDI Extremities: Reasonable muscle mass  Assessment & Plan:   Incarcerated hernia with volvulus S/p surgical reduction of paraesophageal hernia Ongoing management per general surgery  AKI Improving with fluid resuscitation Hope to see ongoing improvement as patient has decreased nausea and vomiting Baseline creatinine is apparently 1.5-2.0 CT  without hydronephrosis  HTN Continue carvedilol Once patient is adequately fluid repleted, would restart losartan Hold chlorthalidone for now  DM 2 Continue SSI until patient is able to take p.o.    DVT prophylaxis: Lovenox Code Status: Full Family Communication: None Home Disposition Plan:   Patient is from: Home  Anticipated Discharge Location: TBD  Barriers to Discharge: Just had surgery  Is patient medically stable for Discharge: No   Scheduled Meds:  bupivacaine liposome  20 mL Infiltration Once   [MAR Hold] carvedilol  25 mg Oral BID WC   Chlorhexidine Gluconate Cloth  6 each Topical Once   [MAR Hold] enoxaparin (LOVENOX) injection  30 mg Subcutaneous Q24H   feeding supplement  592 mL Oral Once   [MAR Hold] insulin aspart  0-15 Units Subcutaneous TID WC   [MAR Hold] pantoprazole  40 mg Oral Daily   scopolamine  1 patch Transdermal On Call to OR   [facility] Hold] sodium chloride flush  3 mL Intravenous Q12H   Continuous Infusions:  sodium chloride 125 mL/hr at 05/22/22 0734   sodium chloride 0.9 % with cefTRIAXone (ROCEPHIN) ADS Med      Data Reviewed:  Basic Metabolic Panel: Recent Labs  Lab 05/20/22 1510 05/20/22 1659 05/21/22 0500 05/22/22 0527  NA  --  141 143 143  K  --  4.0 4.4 4.0  CL  --  100 109 108  CO2  --  '31 28 28  '$ GLUCOSE  --  109* 87  92  BUN  --  54* 46* 39*  CREATININE 4.10* 3.73* 3.16* 2.59*  CALCIUM  --  10.1 8.8* 9.2    CBC: Recent Labs  Lab 05/20/22 1659 05/21/22 0500  WBC 5.8 4.9  NEUTROABS 4.3  --   HGB 12.7 11.9*  HCT 43.6 40.8  MCV 83.0 83.8  PLT 208 211    Studies: No results found.  Principal Problem:   Organoaxial gastric volvulus with obstruction Active Problems:   Class 3 severe obesity with serious comorbidity and body mass index (BMI) of 45.0 to 49.9 in adult [facility])   Essential hypertension   History of breast cancer   Diabetes mellitus (HCC)   Stage 3a chronic kidney disease (CKD) (HCC)   Allergic asthma,  mild intermittent, uncomplicated   Hyperlipidemia associated with type 2 diabetes mellitus (HCC)   GERD (gastroesophageal reflux disease)   Obstructive sleep apnea   Acute renal failure superimposed on stage 3a chronic kidney disease ([place])   Abdominal mass   Incarcerated hiatal hernia   Acute kidney injury (AKI) with acute tubular necrosis (ATN) ([place])   Toupet fundoplicatiopn 05/06/9832     Dewaine Oats Derek Jack, [facility]  If 7PM-7AM, please contact night-coverage [email or web address]   LOS: 1 day

## 2022-05-22 NOTE — Progress Notes (Signed)
Attempted to get report to send for surgery, left message for RN to return call

## 2022-05-22 NOTE — Progress Notes (Signed)
Report given to Jessica RN

## 2022-05-23 ENCOUNTER — Encounter (HOSPITAL_COMMUNITY): Payer: Self-pay | Admitting: Surgery

## 2022-05-23 DIAGNOSIS — E1169 Type 2 diabetes mellitus with other specified complication: Secondary | ICD-10-CM | POA: Diagnosis not present

## 2022-05-23 DIAGNOSIS — N170 Acute kidney failure with tubular necrosis: Secondary | ICD-10-CM

## 2022-05-23 DIAGNOSIS — N179 Acute kidney failure, unspecified: Secondary | ICD-10-CM | POA: Diagnosis not present

## 2022-05-23 DIAGNOSIS — K3189 Other diseases of stomach and duodenum: Secondary | ICD-10-CM | POA: Diagnosis not present

## 2022-05-23 LAB — CBC
HCT: 43.4 % (ref 36.0–46.0)
Hemoglobin: 12.2 g/dL (ref 12.0–15.0)
MCH: 24.5 pg — ABNORMAL LOW (ref 26.0–34.0)
MCHC: 28.1 g/dL — ABNORMAL LOW (ref 30.0–36.0)
MCV: 87.1 fL (ref 80.0–100.0)
Platelets: 181 10*3/uL (ref 150–400)
RBC: 4.98 MIL/uL (ref 3.87–5.11)
RDW: 15.8 % — ABNORMAL HIGH (ref 11.5–15.5)
WBC: 8.5 10*3/uL (ref 4.0–10.5)
nRBC: 0 % (ref 0.0–0.2)

## 2022-05-23 LAB — COMPREHENSIVE METABOLIC PANEL
ALT: 319 U/L — ABNORMAL HIGH (ref 0–44)
AST: 360 U/L — ABNORMAL HIGH (ref 15–41)
Albumin: 3 g/dL — ABNORMAL LOW (ref 3.5–5.0)
Alkaline Phosphatase: 50 U/L (ref 38–126)
Anion gap: 10 (ref 5–15)
BUN: 39 mg/dL — ABNORMAL HIGH (ref 8–23)
CO2: 26 mmol/L (ref 22–32)
Calcium: 8.3 mg/dL — ABNORMAL LOW (ref 8.9–10.3)
Chloride: 108 mmol/L (ref 98–111)
Creatinine, Ser: 2.32 mg/dL — ABNORMAL HIGH (ref 0.44–1.00)
GFR, Estimated: 22 mL/min — ABNORMAL LOW (ref >60)
Glucose, Bld: 128 mg/dL — ABNORMAL HIGH (ref 70–99)
Potassium: 4.8 mmol/L (ref 3.5–5.1)
Sodium: 144 mmol/L (ref 135–145)
Total Bilirubin: 0.9 mg/dL (ref 0.3–1.2)
Total Protein: 6.6 g/dL (ref 6.5–8.1)

## 2022-05-23 LAB — HEMOGLOBIN A1C
Hgb A1c MFr Bld: 8.2 % — ABNORMAL HIGH (ref 4.8–5.6)
Mean Plasma Glucose: 188.64 mg/dL

## 2022-05-23 LAB — GLUCOSE, CAPILLARY
Glucose-Capillary: 131 mg/dL — ABNORMAL HIGH (ref 70–99)
Glucose-Capillary: 94 mg/dL (ref 70–99)
Glucose-Capillary: 84 mg/dL (ref 70–99)

## 2022-05-23 MED ORDER — METOPROLOL TARTRATE 5 MG/5ML IV SOLN
2.5000 mg | Freq: Three times a day (TID) | INTRAVENOUS | Status: DC
  Administered 2022-05-23 – 2022-05-29 (×18): 2.5 mg via INTRAVENOUS
  Filled 2022-05-23 (×18): qty 5

## 2022-05-23 MED ORDER — PANTOPRAZOLE SODIUM 40 MG IV SOLR
40.0000 mg | Freq: Every day | INTRAVENOUS | Status: DC
  Administered 2022-05-23 – 2022-06-02 (×11): 40 mg via INTRAVENOUS
  Filled 2022-05-23 (×11): qty 10

## 2022-05-23 MED ORDER — INSULIN ASPART 100 UNIT/ML IJ SOLN
0.0000 [IU] | Freq: Three times a day (TID) | INTRAMUSCULAR | Status: DC
  Administered 2022-05-25: 2 [IU] via SUBCUTANEOUS
  Administered 2022-05-25: 1 [IU] via SUBCUTANEOUS
  Administered 2022-05-26: 2 [IU] via SUBCUTANEOUS
  Administered 2022-05-26: 1 [IU] via SUBCUTANEOUS
  Administered 2022-05-27: 2 [IU] via SUBCUTANEOUS

## 2022-05-23 NOTE — Progress Notes (Signed)
Pt has declined CPAP for tonight due to NG tube in place.

## 2022-05-23 NOTE — Progress Notes (Signed)
[facility]  PROGRESS NOTE  [name] QQI:[id number] DOB: 1951-09-26 DOA: 05/20/2022 PCP: Andrea Key, DO   Brief HPI:   71 year old female with a history of hypertension, diabetes mellitus type 2 on insulin, CKD stage III a with baseline creatinine 1.6-1.9, hyperlipidemia, history of breast cancer, GERD, untreated sleep apnea presented to ED with abnormal labs.  Patient was scheduled for CT scan, lab test showed increased creatinine so she went to sent to the ED.  In the ED creatinine was 3.73, CT of the abdomen/pelvis with complex encapsulated teratoma like mass on the right mid abdomen, large hiatal hernia, enlarged pancreatic head.  Patient admitted and general surgery consulted.  Patient underwent surgical reduction of paraesophageal hernia    Subjective   Patient seen and examined, NG tube to suction.   Assessment/Plan:     Incarcerated hernia with volvulus -S/p surgical reduction of paraesophageal hernia -Operative management per general surgery -NG tube in place with intermittent suction  Acute kidney injury -Likely from dehydration, nausea and vomiting -CT showed no hydronephrosis -Baseline creatinine around 1.5-2.0 -Patient presented with creatinine of 4.10; improved to 2.32 today -Follow BMP in am  Hypertension -We will start metoprolol 2.5 mg IV every 8 hours  Diabetes mellitus type 2 -CBG well controlled -Continue sliding scale insulin NovoLog    Medications     carvedilol  25 mg Oral BID WC   Chlorhexidine Gluconate Cloth  6 each Topical Once   enoxaparin (LOVENOX) injection  30 mg Subcutaneous Q24H   insulin aspart  0-15 Units Subcutaneous TID WC   pantoprazole (PROTONIX) IV  40 mg Intravenous Daily   sodium chloride flush  3 mL Intravenous Q12H     Data Reviewed:   CBG:  Recent Labs  Lab 05/22/22 0835 05/22/22 0918 05/22/22 2142 05/23/22 0755 05/23/22 1129  GLUCAP 93 98 113* 131* 94    SpO2: 97 % O2 Flow Rate (L/min):  2 L/min    Vitals:   05/23/22 0500 05/23/22 0644 05/23/22 0821 05/23/22 1318  BP:  128/65  (!) 121/47  Pulse:  81  82  Resp:  18  20  Temp:  (!) 97.5 F (36.4 C)  97.6 F (36.4 C)  TempSrc:  Oral  Oral  SpO2:  95% 94% 97%  Weight: 121 kg     Height:          Data Reviewed:  Basic Metabolic Panel: Recent Labs  Lab 05/20/22 1510 05/20/22 1659 05/21/22 0500 05/22/22 0527 05/23/22 0559  NA  --  141 143 143 144  K  --  4.0 4.4 4.0 4.8  CL  --  100 109 108 108  CO2  --  '31 28 28 26  '$ GLUCOSE  --  109* 87 92 128*  BUN  --  54* 46* 39* 39*  CREATININE 4.10* 3.73* 3.16* 2.59* 2.32*  CALCIUM  --  10.1 8.8* 9.2 8.3*    CBC: Recent Labs  Lab 05/20/22 1659 05/21/22 0500 05/23/22 0559  WBC 5.8 4.9 8.5  NEUTROABS 4.3  --   --   HGB 12.7 11.9* 12.2  HCT 43.6 40.8 43.4  MCV 83.0 83.8 87.1  PLT 208 211 181    LFT Recent Labs  Lab 05/20/22 1659 05/21/22 0500 05/23/22 0559  AST 14* 24 360*  ALT 15 11 319*  ALKPHOS 61 51 50  BILITOT 0.4 1.3* 0.9  PROT 7.4 6.5 6.6  ALBUMIN 3.6 3.1* 3.0*     Antibiotics: Anti-infectives (From admission,  onward)    Start     Dose/Rate Route Frequency Ordered Stop   05/22/22 0947  sodium chloride 0.9 % with cefTRIAXone (ROCEPHIN) ADS Med       Note to Pharmacy: [name] E: cabinet override      05/22/22 0947 05/22/22 2159   05/22/22 0947  metroNIDAZOLE (FLAGYL) 500 MG/100ML IVPB       Note to Pharmacy: [name] E: cabinet override      05/22/22 0947 05/22/22 1242   05/21/22 1300  cefTRIAXone (ROCEPHIN) 2 g in sodium chloride 0.9 % 100 mL IVPB  Status:  Discontinued       See Hyperspace for full Linked Orders Report.   2 g 200 mL/hr over 30 Minutes Intravenous On call to O.R. 05/21/22 1250 05/22/22 0559   05/21/22 1300  metroNIDAZOLE (FLAGYL) IVPB 500 mg  Status:  Discontinued       See Hyperspace for full Linked Orders Report.   500 mg 100 mL/hr over 60 Minutes Intravenous On call to O.R. 05/21/22 1250  05/22/22 0559        DVT prophylaxis: Lovenox  Code Status: Full code  Family Communication: No family at bedside   CONSULTS General surgery   Objective    Physical Examination:   General-appears in no acute distress Heart-S1-S2, regular, no murmur auscultated Lungs-clear to auscultation bilaterally, no wheezing or crackles auscultated Abdomen-soft, nontender, no organomegaly Extremities-no edema in the lower extremities Neuro-alert, oriented x3, no focal deficit noted  Status is: Inpatient:          [name]   [facility] If 7PM-7AM, please contact night-coverage at [email or web address], Office  [phone]   05/23/2022, 2:11 PM  LOS: 2 days

## 2022-05-23 NOTE — Evaluation (Addendum)
Physical Therapy Evaluation Patient Details Name: [name] MRN: [id number] DOB: 1951/02/01 Today's Date: 05/23/2022  History of Present Illness  71 yo female admitted with gastric obstruction. S/P hernia repair with mesh 05/22/22. Hx of R TKA 2015, chronic L knee pain, obesity, DM, CKD, breast ca, OSA  Clinical Impression  On eval, pt required Min A for mobility. She walked ~50 feet with a RW. Ambulation distance limited by fatigue. Used recliner to transport pt back to room. Pt reports pain as controlled. Recommend daily ambulation in hallway with nursing assistance as able. Recommending HHPT and RW use at this time. Will continue to follow pt during this hospital stay.        Recommendations for follow up therapy are one component of a multi-disciplinary discharge planning process, led by the attending physician.  Recommendations may be updated based on patient status, additional functional criteria and insurance authorization.  Follow Up Recommendations Home health PT (vs no follow up-depending on progress)      Assistance Recommended at Discharge Intermittent Supervision/Assistance  Patient can return home with the following  A little help with walking and/or transfers;A little help with bathing/dressing/bathroom;Assistance with cooking/housework;Assist for transportation;Help with stairs or ramp for entrance    Equipment Recommendations Rolling walker (2 wheels)  Recommendations for Other Services       Functional Status Assessment Patient has had a recent decline in their functional status and demonstrates the ability to make significant improvements in function in a reasonable and predictable amount of time.     Precautions / Restrictions Precautions Precautions: Fall Precaution Comments: jp drain, NG tube, abs sg Restrictions Weight Bearing Restrictions: No      Mobility  Bed Mobility Overal bed mobility: Needs Assistance Bed Mobility: Supine to Sit     Supine to  sit: Min assist, HOB elevated     General bed mobility comments: Assist to scoot to EOB. Increased time. Pt relied on bedrail. Rest break needed after bed mobility    Transfers Overall transfer level: Needs assistance Equipment used: Rolling walker (2 wheels) Transfers: Sit to/from Stand Sit to Stand: Min assist, From elevated surface           General transfer comment: Assist to rise, steady. Cues for safety, hand placement.    Ambulation/Gait Ambulation/Gait assistance: Min assist Gait Distance (Feet): 50 Feet Assistive device: Rolling walker (2 wheels) Gait Pattern/deviations: Step-through pattern, Decreased stride length       General Gait Details: Cues for safety, RW proximity. Pt fatigues fairly easily. Followed with recliner and used it to transport pt back to room.  Stairs            Wheelchair Mobility    Modified Rankin (Stroke Patients Only)       Balance Overall balance assessment: Needs assistance         Standing balance support: Bilateral upper extremity supported, Reliant on assistive device for balance, During functional activity Standing balance-Leahy Scale: Fair                               Pertinent Vitals/Pain Pain Assessment Pain Assessment: 0-10 Pain Score: 3  Pain Location: abdomen Pain Descriptors / Indicators: Discomfort, Sore, Tightness Pain Intervention(s): Limited activity within patient's tolerance, Monitored during session, Repositioned    Home Living Family/patient expects to be discharged to:: Private residence Living Arrangements: Spouse/significant other Available Help at Discharge: Family Type of Home: House  Home Equipment: Kasandra Knudsen - single point      Prior Function Prior Level of Function : Independent/Modified Independent             Mobility Comments: uses cane in community       Hand Dominance        Extremity/Trunk Assessment   Upper Extremity Assessment Upper  Extremity Assessment: Overall WFL for tasks assessed    Lower Extremity Assessment Lower Extremity Assessment: Generalized weakness    Cervical / Trunk Assessment Cervical / Trunk Assessment: Normal  Communication   Communication: No difficulties  Cognition Arousal/Alertness: Awake/alert Behavior During Therapy: WFL for tasks assessed/performed Overall Cognitive Status: Within Functional Limits for tasks assessed                                          General Comments      Exercises     Assessment/Plan    PT Assessment Patient needs continued PT services  PT Problem List Decreased strength;Decreased mobility;Decreased activity tolerance;Decreased balance;Decreased knowledge of use of DME;Pain       PT Treatment Interventions DME instruction;Gait training;Therapeutic activities;Therapeutic exercise;Patient/family education;Functional mobility training;Balance training    PT Goals (Current goals can be found in the Care Plan section)  Acute Rehab PT Goals Patient Stated Goal: regain PLOF PT Goal Formulation: With patient Time For Goal Achievement: 06/06/22 Potential to Achieve Goals: Good    Frequency Min 3X/week     Co-evaluation               AM-PAC PT "6 Clicks" Mobility  Outcome Measure Help needed turning from your back to your side while in a flat bed without using bedrails?: A Little Help needed moving from lying on your back to sitting on the side of a flat bed without using bedrails?: A Little Help needed moving to and from a bed to a chair (including a wheelchair)?: A Little Help needed standing up from a chair using your arms (e.g., wheelchair or bedside chair)?: A Little Help needed to walk in hospital room?: A Little Help needed climbing 3-5 steps with a railing? : A Little 6 Click Score: 18    End of Session   Activity Tolerance: Patient tolerated treatment well Patient left: in chair;with call bell/phone within  reach Nurse Communication: at end of session, made RN aware that pt was back in room (so she could reconnect NG tube to suction)  PT Visit Diagnosis: Difficulty in walking, not elsewhere classified (R26.2);Pain;Muscle weakness (generalized) (M62.81) Pain - part of body:  (abdomen)    Time: 5597-4163 PT Time Calculation (min) (ACUTE ONLY): 33 min   Charges:   PT Evaluation $PT Eval Moderate Complexity: 1 Mod PT Treatments $Gait Training: 8-22 mins           Doreatha Massed, PT Acute Rehabilitation  Office: [phone] Pager: [phone]

## 2022-05-23 NOTE — Progress Notes (Signed)
1 Day Post-Op   Subjective/Chief Complaint: No complaints, discussed surgery   Objective: Vital signs in last 24 hours: Temp:  [97.3 F (36.3 C)-98 F (36.7 C)] 97.5 F (36.4 C) (08/26 0644) Pulse Rate:  [73-89] 81 (08/26 0644) Resp:  [17-22] 18 (08/26 0644) BP: (108-165)/(56-93) 128/65 (08/26 0644) SpO2:  [92 %-99 %] 95 % (08/26 0644) Weight:  [121 kg] 121 kg (08/26 0500) Last BM Date : 05/19/22  Intake/Output from previous day: 08/25 0701 - 08/26 0700 In: 5200.5 [I.V.:4900.5; IV Piggyback:300] Out: 1850 [Urine:1000; Emesis/NG output:300; Drains:150; Blood:150] Intake/Output this shift: No intake/output data recorded.  Abdomen approp tender, drain serosang as expected, soft  Lab Results:  Recent Labs    05/21/22 0500 05/23/22 0559  WBC 4.9 8.5  HGB 11.9* 12.2  HCT 40.8 43.4  PLT 211 181   BMET Recent Labs    05/21/22 0500 05/22/22 0527  NA 143 143  K 4.4 4.0  CL 109 108  CO2 28 28  GLUCOSE 87 92  BUN 46* 39*  CREATININE 3.16* 2.59*  CALCIUM 8.8* 9.2   PT/INR No results for input(s): "LABPROT", "INR" in the last 72 hours. ABG No results for input(s): "PHART", "HCO3" in the last 72 hours.  Invalid input(s): "PCO2", "PO2"  Studies/Results: No results found.  Anti-infectives: Anti-infectives (From admission, onward)    Start     Dose/Rate Route Frequency Ordered Stop   05/22/22 0947  sodium chloride 0.9 % with cefTRIAXone (ROCEPHIN) ADS Med       Note to Pharmacy: Kyra Leyland E: cabinet override      05/22/22 0947 05/22/22 2159   05/22/22 0947  metroNIDAZOLE (FLAGYL) 500 MG/100ML IVPB       Note to Pharmacy: Kyra Leyland E: cabinet override      05/22/22 0947 05/22/22 1242   05/21/22 1300  cefTRIAXone (ROCEPHIN) 2 g in sodium chloride 0.9 % 100 mL IVPB  Status:  Discontinued       See Hyperspace for full Linked Orders Report.   2 g 200 mL/hr over 30 Minutes Intravenous On call to O.R. 05/21/22 1250 05/22/22 0559   05/21/22 1300   metroNIDAZOLE (FLAGYL) IVPB 500 mg  Status:  Discontinued       See Hyperspace for full Linked Orders Report.   500 mg 100 mL/hr over 60 Minutes Intravenous On call to O.R. 05/21/22 1250 05/22/22 0559       Assessment/Plan: POD 1 PEH repair-Gross -leave ng tube today, if does well will do contrast study tomorrow and possibly remove -npo -foley can come out today -lovenox, scds -needs oob, pulm toilet  Rolm Bookbinder 05/23/2022

## 2022-05-24 ENCOUNTER — Inpatient Hospital Stay (HOSPITAL_COMMUNITY): Payer: Medicare PPO

## 2022-05-24 DIAGNOSIS — E1169 Type 2 diabetes mellitus with other specified complication: Secondary | ICD-10-CM | POA: Diagnosis not present

## 2022-05-24 DIAGNOSIS — N189 Chronic kidney disease, unspecified: Secondary | ICD-10-CM

## 2022-05-24 DIAGNOSIS — K440 Diaphragmatic hernia with obstruction, without gangrene: Secondary | ICD-10-CM | POA: Diagnosis not present

## 2022-05-24 DIAGNOSIS — N179 Acute kidney failure, unspecified: Secondary | ICD-10-CM | POA: Diagnosis not present

## 2022-05-24 DIAGNOSIS — I10 Essential (primary) hypertension: Secondary | ICD-10-CM | POA: Diagnosis not present

## 2022-05-24 LAB — GLUCOSE, CAPILLARY
Glucose-Capillary: 75 mg/dL (ref 70–99)
Glucose-Capillary: 93 mg/dL (ref 70–99)
Glucose-Capillary: 87 mg/dL (ref 70–99)
Glucose-Capillary: 69 mg/dL — ABNORMAL LOW (ref 70–99)
Glucose-Capillary: 86 mg/dL (ref 70–99)
Glucose-Capillary: 113 mg/dL — ABNORMAL HIGH (ref 70–99)
Glucose-Capillary: 158 mg/dL — ABNORMAL HIGH (ref 70–99)

## 2022-05-24 LAB — COMPREHENSIVE METABOLIC PANEL
ALT: 290 U/L — ABNORMAL HIGH (ref 0–44)
AST: 248 U/L — ABNORMAL HIGH (ref 15–41)
Albumin: 2.9 g/dL — ABNORMAL LOW (ref 3.5–5.0)
Alkaline Phosphatase: 45 U/L (ref 38–126)
Anion gap: 7 (ref 5–15)
BUN: 41 mg/dL — ABNORMAL HIGH (ref 8–23)
CO2: 25 mmol/L (ref 22–32)
Calcium: 7.4 mg/dL — ABNORMAL LOW (ref 8.9–10.3)
Chloride: 112 mmol/L — ABNORMAL HIGH (ref 98–111)
Creatinine, Ser: 2.24 mg/dL — ABNORMAL HIGH (ref 0.44–1.00)
GFR, Estimated: 23 mL/min — ABNORMAL LOW (ref >60)
Glucose, Bld: 86 mg/dL (ref 70–99)
Potassium: 4.2 mmol/L (ref 3.5–5.1)
Sodium: 144 mmol/L (ref 135–145)
Total Bilirubin: 0.9 mg/dL (ref 0.3–1.2)
Total Protein: 6.2 g/dL — ABNORMAL LOW (ref 6.5–8.1)

## 2022-05-24 LAB — CBC
HCT: 41.6 % (ref 36.0–46.0)
Hemoglobin: 11.7 g/dL — ABNORMAL LOW (ref 12.0–15.0)
MCH: 24.2 pg — ABNORMAL LOW (ref 26.0–34.0)
MCHC: 28.1 g/dL — ABNORMAL LOW (ref 30.0–36.0)
MCV: 86.1 fL (ref 80.0–100.0)
Platelets: 164 10*3/uL (ref 150–400)
RBC: 4.83 MIL/uL (ref 3.87–5.11)
RDW: 16.2 % — ABNORMAL HIGH (ref 11.5–15.5)
WBC: 7.4 10*3/uL (ref 4.0–10.5)
nRBC: 0 % (ref 0.0–0.2)

## 2022-05-24 MED ORDER — SODIUM CHLORIDE 0.9 % IV SOLN: 250.0000 mL | INTRAVENOUS | Status: DC | PRN

## 2022-05-24 MED ORDER — IOHEXOL 300 MG/ML  SOLN
50.0000 mL | Freq: Once | INTRAMUSCULAR | Status: AC | PRN
  Administered 2022-05-24: 50 mL via ORAL

## 2022-05-24 MED ORDER — FUROSEMIDE 10 MG/ML IJ SOLN: 40.0000 mg | Freq: Once | INTRAMUSCULAR | Status: DC

## 2022-05-24 MED ORDER — HYDRALAZINE HCL 20 MG/ML IJ SOLN
10.0000 mg | Freq: Four times a day (QID) | INTRAMUSCULAR | Status: DC | PRN
Start: 2022-05-24 — End: 2022-06-02
  Administered 2022-05-26 – 2022-05-28 (×2): 10 mg via INTRAVENOUS
  Filled 2022-05-24 (×3): qty 1

## 2022-05-24 MED ORDER — SODIUM CHLORIDE 0.9% FLUSH
3.0000 mL | Freq: Two times a day (BID) | INTRAVENOUS | Status: DC
  Administered 2022-05-24 – 2022-05-25 (×2): 3 mL via INTRAVENOUS

## 2022-05-24 MED ORDER — SODIUM CHLORIDE 0.9% FLUSH: 3.0000 mL | INTRAVENOUS | Status: DC | PRN

## 2022-05-24 MED ORDER — DEXAMETHASONE SODIUM PHOSPHATE 10 MG/ML IJ SOLN
4.0000 mg | Freq: Two times a day (BID) | INTRAMUSCULAR | Status: AC
  Administered 2022-05-24 – 2022-05-26 (×6): 4 mg via INTRAVENOUS
  Filled 2022-05-24 (×6): qty 1

## 2022-05-24 MED ORDER — LACTATED RINGERS IV BOLUS: 1000.0000 mL | Freq: Three times a day (TID) | INTRAVENOUS | Status: DC | PRN

## 2022-05-24 MED ORDER — DEXTROSE IN LACTATED RINGERS 5 % IV SOLN
INTRAVENOUS | Status: DC
Start: 2022-05-24 — End: 2022-05-25

## 2022-05-24 NOTE — Progress Notes (Signed)
2 Days Post-Op   Subjective/Chief Complaint: No complaints this am   Objective: Vital signs in last 24 hours: Temp:  [97.6 F (36.4 C)-98.2 F (36.8 C)] 98.2 F (36.8 C) (08/26 2057) Pulse Rate:  [82-88] 85 (08/27 0522) Resp:  [17-20] 17 (08/27 0522) BP: (121-131)/(47-65) 124/65 (08/27 0522) SpO2:  [91 %-97 %] 91 % (08/27 0522) Last BM Date : 05/19/22  Intake/Output from previous day: 08/26 0701 - 08/27 0700 In: 1797.2 [I.V.:1797.2] Out: 2070 [Urine:1650; Emesis/NG output:100; Drains:320] Intake/Output this shift: No intake/output data recorded.  Ab approp tender drain serosang, nondistended dressings dry  Lab Results:  Recent Labs    05/23/22 0559 05/24/22 0734  WBC 8.5 7.4  HGB 12.2 11.7*  HCT 43.4 41.6  PLT 181 164   BMET Recent Labs    05/23/22 0559 05/24/22 0734  NA 144 144  K 4.8 4.2  CL 108 112*  CO2 26 25  GLUCOSE 128* 86  BUN 39* 41*  CREATININE 2.32* 2.24*  CALCIUM 8.3* 7.4*   PT/INR No results for input(s): "LABPROT", "INR" in the last 72 hours. ABG No results for input(s): "PHART", "HCO3" in the last 72 hours.  Invalid input(s): "PCO2", "PO2"  Studies/Results: No results found.  Anti-infectives: Anti-infectives (From admission, onward)    Start     Dose/Rate Route Frequency Ordered Stop   05/22/22 0947  sodium chloride 0.9 % with cefTRIAXone (ROCEPHIN) ADS Med       Note to Pharmacy: Kyra Leyland E: cabinet override      05/22/22 0947 05/22/22 2159   05/22/22 0947  metroNIDAZOLE (FLAGYL) 500 MG/100ML IVPB       Note to Pharmacy: Kyra Leyland E: cabinet override      05/22/22 0947 05/22/22 1242   05/21/22 1300  cefTRIAXone (ROCEPHIN) 2 g in sodium chloride 0.9 % 100 mL IVPB  Status:  Discontinued       See Hyperspace for full Linked Orders Report.   2 g 200 mL/hr over 30 Minutes Intravenous On call to O.R. 05/21/22 1250 05/22/22 0559   05/21/22 1300  metroNIDAZOLE (FLAGYL) IVPB 500 mg  Status:  Discontinued       See  Hyperspace for full Linked Orders Report.   500 mg 100 mL/hr over 60 Minutes Intravenous On call to O.R. 05/21/22 1250 05/22/22 0559       Assessment/Plan: POD 2 PEH repair-Gross -leave ng tube until after swallow planned for today, if that goes well will dc and let her have clears -npo for now -lovenox, scds -needs oob, pulm toilet Rolm Bookbinder 05/24/2022

## 2022-05-24 NOTE — Progress Notes (Signed)
PROGRESS NOTE   [name]  EZM:[id number]    DOB: [birth year]    DOA: 05/20/2022  PCP: Shary Key, DO   I have briefly reviewed patients previous medical records in [facility].  Chief Complaint  Patient presents with   Emesis   Hypoxia    Brief Narrative:  71 year old female with PMH of breast cancer s/p right lumpectomy and radiation, stage IIIb CKD, type II DM, GERD, large hiatal hernia, HLD, HTN, iron deficiency anemia, morbid obesity, OSA and anemia, presented to the ED on 05/20/2022 for evaluation of acute kidney injury.  She was known to have a large hiatal hernia with intermittent nausea and vomiting, symptoms got progressively worse in the week PTA with progressive nausea and vomiting and unable to keep anything down.  Has a outpatient, she had a upper GI series that showed a possible mass so CT abdomen and pelvis was ordered.  Serum creatinine was checked and was over 4.  She was also noted to be hypoxic in the 80s.  Admitted for incarcerated hiatal hernia with volvulus causing total obstruction, abdominal mass/teratoma likely an incidental finding, acute kidney injury.  General surgery was consulted and s/p paraesophageal hernia repair by Dr. Johney Maine on 8/25.   Assessment & Plan:  Principal Problem:   Incarcerated hiatal hernia Active Problems:   Class 3 severe obesity with serious comorbidity and body mass index (BMI) of 45.0 to 49.9 in adult [facility])   Essential hypertension   History of breast cancer   Diabetes mellitus (HCC)   Allergic asthma, mild intermittent, uncomplicated   Hyperlipidemia associated with type 2 diabetes mellitus (HCC)   GERD (gastroesophageal reflux disease)   Obstructive sleep apnea   Acute kidney injury superimposed on chronic kidney disease ([place])   Abdominal mass   [place] fundoplicatiopn 01/29/5464   Incarcerated hiatal hernia with volvulus causing total obstruction: General surgery consulted and s/p paraesophageal hernia repair on  8/25 by Dr. Johney Maine.  Management per general surgery.  Postop esophagogram 8/27: NG tube was in the mouth, removed.  Continue pain management, IV PPI, antiemetics and IV fluids.  Acute kidney injury complicating stage IIIb (not 3a) CKD: Baseline creatinine in the 1.4-1.5 range since 2021.  Presented with creatinine of 3.73.  Likely due to dehydration from poor oral intake, GI losses and home losartan and chlorthalidone use (meds held).  Treating with IV fluids and creatinine steadily improving.  Continue to trend daily BMP and continue IV fluids.  No hydronephrosis on CT.  Acute transaminitis: LFTs were unremarkable preop and abruptly increased to AST of 360 and ALT of 319 on 8/26.  Unclear etiology.  Did not notice any preop hypotension to suggest shock liver.?  Related to anesthesia.  Fortunately improving, AST down to 248 and ALT 290.  Continue to trend daily CMP.  Further evaluation if worsens.  Abdominal mass/possible teratoma CT A/P 05/20/2022: "There is a complex, well encapsulated, partial wall calcified cystic mass in the right mid abdomen containing the fatty, soft tissue components and to a lesser extent small areas of calcifications consistent with mesenteric teratoma measuring approximately 12 x 15 x 15.7 cm". Per general surgery, incidental finding and no urgent need for surgery.  Essential hypertension: Holding home p.o. meds (carvedilol, chlorthalidone and losartan) due to n.p.o. and AKI.  Currently on IV metoprolol and controlled.  Change to oral carvedilol when tolerating p.o.   Type II with renal complication's K8L 8.2 on 8/26.  Tightly controlled CBGs and even had hypoglycemic episode  on 8/27, likely due to NPO.  Change IVF to D5 containing fluids.  DC SSI for now and consider resuming when CBGs consistently >150 or 180.  Anemia: Stable.  GERD: Management as above for hiatal hernia.  PPI.  Asthma Stable without bronchospasm.  As needed albuterol nebs.  OSA on  CPAP Continue CPAP when NG tube discontinued.  Breast cancer, s/p lumpectomy, s/p XRT See below under CT incidental findings.  Incidental findings on CT A/P 8/23 that need further evaluation, as outpatient as deemed necessary: - Questionable mild thickening of the ascending and transverse colonic wall.  Severe diverticulosis of sigmoid colon.  Colonoscopy if not already done recently - Enlarged uterus for age containing multiple fibroids with some fibroid showing popcorn calcifications.  Outpatient GYN consultation -Apparent enlargement of pancreatic head measuring 4.9 x 4.1 cm.  CT/MRI of the abdomen per the pancreatic mass protocol. - 1.2 cm nodular density in the retroareolar area lateral left breast.  Needs ultrasound/mammogram.  Body mass index is 48.01 kg/m./Morbid obesity  Nutritional Status Nutrition Problem: Increased nutrient needs Etiology: post-op healing Signs/Symptoms: estimated needs Interventions: Refer to RD note for recommendations  DVT prophylaxis: enoxaparin (LOVENOX) injection 30 mg Start: 05/20/22 2200     Code Status: Full Code:  Family Communication: None at bedside Disposition:  Status is: Inpatient Remains inpatient appropriate because: Postop progress.  Still n.p.o., IV fluids etc.     Consultants:   General surgery  Procedures:   As above  Antimicrobials:      Subjective:  Reports that she did not sleep well last night because of cough with intermittent white sputum.  Reports some nausea and vomiting.  Indicates that she was up in chair for several hours yesterday and even walked up to the nurses station.  Still without a BM or flatus.  Objective:   Vitals:   05/23/22 1318 05/23/22 2057 05/24/22 0522 05/24/22 0906  BP: (!) 121/47 (!) 131/55 124/65   Pulse: 82 88 85 80  Resp: '20 17 17   '$ Temp: 97.6 F (36.4 C) 98.2 F (36.8 C)    TempSrc: Oral Oral    SpO2: 97% 93% 91% 91%  Weight:      Height:        General exam: Elderly female,  moderately built and morbidly obese lying comfortably propped up in bed without distress.  Head NG tube in place. Respiratory system: Clear to auscultation. Respiratory effort normal. Cardiovascular system: S1 & S2 heard, RRR. No JVD, murmurs, rubs, gallops or clicks. No pedal edema.  Telemetry personally reviewed: Sinus rhythm. Gastrointestinal system: Abdomen is nondistended, appropriate postop soreness/tenderness, drain with serosanguineous drainage, surgical dressing clean and dry.  Absent bowel sounds. Central nervous system: Alert and oriented. No focal neurological deficits. Extremities: Symmetric 5 x 5 power. Skin: No rashes, lesions or ulcers Psychiatry: Judgement and insight appear normal. Mood & affect appropriate.     Data Reviewed:   I have personally reviewed following labs and imaging studies   CBC: Recent Labs  Lab 05/20/22 1659 05/21/22 0500 05/23/22 0559 05/24/22 0734  WBC 5.8 4.9 8.5 7.4  NEUTROABS 4.3  --   --   --   HGB 12.7 11.9* 12.2 11.7*  HCT 43.6 40.8 43.4 41.6  MCV 83.0 83.8 87.1 86.1  PLT 208 211 181 659    Basic Metabolic Panel: Recent Labs  Lab 05/20/22 1659 05/21/22 0500 05/22/22 0527 05/23/22 0559 05/24/22 0734  NA 141 143 143 144 144  K 4.0 4.4 4.0 4.8  4.2  CL 100 109 108 108 112*  CO2 '31 28 28 26 25  '$ GLUCOSE 109* 87 92 128* 86  BUN 54* 46* 39* 39* 41*  CREATININE 3.73* 3.16* 2.59* 2.32* 2.24*  CALCIUM 10.1 8.8* 9.2 8.3* 7.4*    Liver Function Tests: Recent Labs  Lab 05/20/22 1659 05/21/22 0500 05/23/22 0559 05/24/22 0734  AST 14* 24 360* 248*  ALT 15 11 319* 290*  ALKPHOS 61 51 50 45  BILITOT 0.4 1.3* 0.9 0.9  PROT 7.4 6.5 6.6 6.2*  ALBUMIN 3.6 3.1* 3.0* 2.9*    CBG: Recent Labs  Lab 05/24/22 0759 05/24/22 1129 05/24/22 1157  GLUCAP 87 69* 42    Microbiology Studies:   Recent Results (from the past 240 hour(s))  SARS Coronavirus 2 by RT PCR (hospital order, performed in [facility] hospital lab) *cepheid  single result test* Anterior Nasal Swab     Status: None   Collection Time: 05/20/22  5:00 PM   Specimen: Anterior Nasal Swab  Result Value Ref Range Status   SARS Coronavirus 2 by RT PCR NEGATIVE NEGATIVE Final    Comment: (NOTE) SARS-CoV-2 target nucleic acids are NOT DETECTED.  The SARS-CoV-2 RNA is generally detectable in upper and lower respiratory specimens during the acute phase of infection. The lowest concentration of SARS-CoV-2 viral copies this assay can detect is 250 copies / mL. A negative result does not preclude SARS-CoV-2 infection and should not be used as the sole basis for treatment or other patient management decisions.  A negative result may occur with improper specimen collection / handling, submission of specimen other than nasopharyngeal swab, presence of viral mutation(s) within the areas targeted by this assay, and inadequate number of viral copies (<250 copies / mL). A negative result must be combined with clinical observations, patient history, and epidemiological information.  Fact Sheet for Patients:   https://www.patel.info/  Fact Sheet for Healthcare Providers: https://hall.com/  This test is not yet approved or  cleared by the [place] FDA and has been authorized for detection and/or diagnosis of SARS-CoV-2 by FDA under an Emergency Use Authorization (EUA).  This EUA will remain in effect (meaning this test can be used) for the duration of the COVID-19 declaration under Section 564(b)(1) of the Act, 21 U.S.C. section 360bbb-3(b)(1), unless the authorization is terminated or revoked sooner.  Performed at [facility], [place] [address] [place], [place] [address]     Radiology Studies:  DG ESOPHAGUS W SINGLE CM (SOL OR THIN BA)  Result Date: 05/24/2022 CLINICAL DATA:  Two days postop from fundoplication for large hiatal hernia. EXAM: ESOPHAGRAM WITH WATER-SOLUBLE CONTRAST  TECHNIQUE: Single contrast examination was performed using water-soluble Omnipaque 300 contrast. This exam was performed by Durenda Guthrie, PA-C, and was supervised and interpreted by Earle Gell, MD. FLUOROSCOPY: Radiation Exposure Index (as provided by the fluoroscopic device): 22.6 mGy Kerma COMPARISON:  None Available. FINDINGS: Scout image shows a left abdominal surgical drain, and pre-existing contrast material in the left colon which is attributable to prior CT. In addition, a nasogastric tube is seen which is looped back on itself in the proximal thoracic esophagus. The patient vomited during the exam, and the nasogastric tube came out. Patient drank 50 mL of Omnipaque 300. The esophagus is mildly dilated, and there is new complete obstruction of the distal esophagus with a "bird beak" appearance. Only a tiny amount of contrast passes into the stomach. No evidence of contrast leak or extravasation. Patient vomited, and the NG  tube came into her mouth. The NG tube was therefore removed. IMPRESSION: Near-complete obstruction of the distal esophagus, with only a very small amount of contrast passing into the stomach. No evidence of contrast leak or extravasation. The patient vomited after drinking oral contrast contrast, and the nasogastric tube came out of her mouth, and was therefore removed. These results will be called to the ordering clinician or representative by the Radiologist Assistant, and communication documented in the PACS or [employer]. Electronically Signed   By: Marlaine Hind M.D.   On: 05/24/2022 11:29    Scheduled Meds:    Chlorhexidine Gluconate Cloth  6 each Topical Once   dexamethasone (DECADRON) injection  4 mg Intravenous Q12H   enoxaparin (LOVENOX) injection  30 mg Subcutaneous Q24H   insulin aspart  0-9 Units Subcutaneous TID WC   metoprolol tartrate  2.5 mg Intravenous Q8H   pantoprazole (PROTONIX) IV  40 mg Intravenous Daily   sodium chloride flush  3 mL Intravenous Q12H    sodium chloride flush  3 mL Intravenous Q12H   sodium chloride flush  3 mL Intravenous Q12H    Continuous Infusions:    sodium chloride     sodium chloride     lactated ringers     ondansetron (ZOFRAN) IV       LOS: 3 days     Vernell Leep, MD,  FACP, [facility], [facility], [facility] (Care Management Physician Certified) [place]  To contact the attending provider between 7A-7P or the covering provider during after hours 7P-7A, please log into the web site [email or web address] and access using universal  password for that web site. If you do not have the password, please call the hospital operator.  05/24/2022, 1:30 PM

## 2022-05-24 NOTE — Progress Notes (Signed)
Patient came down to radiology for post OP water soluble esophagram.   Scout image showed no NGT in the UGI.  Patient started vomiting after drinking 50 mL Omni 300, NGT was visible in her mouth, NGT removed.   Patient tolerated the exam well otherwise, transferred back to the floor.  Please see dictation for detail.  Armando Gang Shyna Duignan PA-C 05/24/2022 10:27 AM

## 2022-05-24 NOTE — Progress Notes (Signed)
Pt wants to hold off on CPAP for tonight.

## 2022-05-24 NOTE — Progress Notes (Signed)
Patient CBG rechecked due to initial low result of 69 : rechecked result 86.

## 2022-05-25 ENCOUNTER — Inpatient Hospital Stay (HOSPITAL_COMMUNITY): Payer: Medicare PPO

## 2022-05-25 DIAGNOSIS — N179 Acute kidney failure, unspecified: Secondary | ICD-10-CM | POA: Diagnosis not present

## 2022-05-25 DIAGNOSIS — K440 Diaphragmatic hernia with obstruction, without gangrene: Secondary | ICD-10-CM

## 2022-05-25 DIAGNOSIS — I10 Essential (primary) hypertension: Secondary | ICD-10-CM | POA: Diagnosis not present

## 2022-05-25 DIAGNOSIS — M7989 Other specified soft tissue disorders: Secondary | ICD-10-CM

## 2022-05-25 DIAGNOSIS — E1169 Type 2 diabetes mellitus with other specified complication: Secondary | ICD-10-CM | POA: Diagnosis not present

## 2022-05-25 LAB — CBC
HCT: 40.6 % (ref 36.0–46.0)
Hemoglobin: 11.6 g/dL — ABNORMAL LOW (ref 12.0–15.0)
MCH: 24.5 pg — ABNORMAL LOW (ref 26.0–34.0)
MCHC: 28.6 g/dL — ABNORMAL LOW (ref 30.0–36.0)
MCV: 85.8 fL (ref 80.0–100.0)
Platelets: 139 10*3/uL — ABNORMAL LOW (ref 150–400)
RBC: 4.73 MIL/uL (ref 3.87–5.11)
RDW: 16.4 % — ABNORMAL HIGH (ref 11.5–15.5)
WBC: 6 10*3/uL (ref 4.0–10.5)
nRBC: 0 % (ref 0.0–0.2)

## 2022-05-25 LAB — GLUCOSE, CAPILLARY
Glucose-Capillary: 168 mg/dL — ABNORMAL HIGH (ref 70–99)
Glucose-Capillary: 115 mg/dL — ABNORMAL HIGH (ref 70–99)
Glucose-Capillary: 138 mg/dL — ABNORMAL HIGH (ref 70–99)

## 2022-05-25 LAB — COMPREHENSIVE METABOLIC PANEL
ALT: 278 U/L — ABNORMAL HIGH (ref 0–44)
AST: 194 U/L — ABNORMAL HIGH (ref 15–41)
Albumin: 2.9 g/dL — ABNORMAL LOW (ref 3.5–5.0)
Alkaline Phosphatase: 44 U/L (ref 38–126)
Anion gap: 6 (ref 5–15)
BUN: 38 mg/dL — ABNORMAL HIGH (ref 8–23)
CO2: 26 mmol/L (ref 22–32)
Calcium: 7.8 mg/dL — ABNORMAL LOW (ref 8.9–10.3)
Chloride: 114 mmol/L — ABNORMAL HIGH (ref 98–111)
Creatinine, Ser: 1.89 mg/dL — ABNORMAL HIGH (ref 0.44–1.00)
GFR, Estimated: 28 mL/min — ABNORMAL LOW (ref >60)
Glucose, Bld: 186 mg/dL — ABNORMAL HIGH (ref 70–99)
Potassium: 4.7 mmol/L (ref 3.5–5.1)
Sodium: 146 mmol/L — ABNORMAL HIGH (ref 135–145)
Total Bilirubin: 0.9 mg/dL (ref 0.3–1.2)
Total Protein: 6.2 g/dL — ABNORMAL LOW (ref 6.5–8.1)

## 2022-05-25 LAB — POCT I-STAT CREATININE: Creatinine, Ser: 4.1 mg/dL — ABNORMAL HIGH (ref 0.44–1.00)

## 2022-05-25 MED ORDER — ENOXAPARIN SODIUM 40 MG/0.4ML IJ SOSY
40.0000 mg | PREFILLED_SYRINGE | INTRAMUSCULAR | Status: DC
  Administered 2022-05-25 – 2022-06-01 (×8): 40 mg via SUBCUTANEOUS
  Filled 2022-05-25 (×8): qty 0.4

## 2022-05-25 MED ORDER — ORAL CARE MOUTH RINSE
15.0000 mL | OROMUCOSAL | Status: DC
  Administered 2022-05-25 – 2022-06-02 (×29): 15 mL via OROMUCOSAL

## 2022-05-25 MED ORDER — ORAL CARE MOUTH RINSE: 15.0000 mL | OROMUCOSAL | Status: DC | PRN

## 2022-05-25 MED ORDER — SALINE SPRAY 0.65 % NA SOLN: 1.0000 | NASAL | Status: DC | PRN

## 2022-05-25 MED ORDER — ACETAMINOPHEN 10 MG/ML IV SOLN
1000.0000 mg | Freq: Four times a day (QID) | INTRAVENOUS | Status: DC
Start: 2022-05-25 — End: 2022-05-26
  Administered 2022-05-25 – 2022-05-26 (×3): 1000 mg via INTRAVENOUS
  Filled 2022-05-25 (×4): qty 100

## 2022-05-25 NOTE — Progress Notes (Signed)
Pt IV infiltrated to left arm causing edema up to shoulder. She has an IV to right hand placed by R. Fitzgerald MD in surgery. The right extremity is restricted d/t mastectomy and lymph nodes removed. At this time IV team is unable to get an IV in the LUE. She is on mIVF with dextrose d/t frequent low blood sugars. On call made aware and discuss need for central line. At this time pt arm is propped up to reduce edema and IV team will be call to place IV if edema reduce in 1-2 hrs.

## 2022-05-25 NOTE — Progress Notes (Signed)
Left upper extremity venous duplex has been completed. Preliminary results can be found in CV Proc through chart review.  Results were given to the patient's nurse, Logan.  05/25/22 10:05 AM [name] RVT

## 2022-05-25 NOTE — TOC Initial Note (Addendum)
Transition of Care [facility]) - Initial/Assessment Note    Patient Details  Name: [name] MRN: [id number] Date of Birth: 04/13/1951  [facility] CM/SW Contact:    Vassie Moselle, LCSW Phone Number: 05/25/2022, 12:34 PM  Clinical Narrative:                 Met with pt and confirmed need for rolling walker Pt state she has had a walker in the past that she received in 2017 or 2018 however, no longer has this walker. She states she believes she had a different insurance at the time that was used for this walker. Pt declines need for HHPT. CSW ordered rolling walker through [employer] who will check pt's insurance to confirm insurance coverage for DME. Rolling walker is to be delivered to pt's room prior to discharge.  Pt falls in doughnut hole with insurance/medication coverage. Unfortunately, there are no additional resources for medication assistance as pt has insurance.   Expected Discharge Plan: Home/Self Care Barriers to Discharge: No Barriers Identified   Patient Goals and CMS Choice Patient states their goals for this hospitalization and ongoing recovery are:: To return home   Choice offered to / list presented to : Patient  Expected Discharge Plan and Services Expected Discharge Plan: Home/Self Care In-house Referral: NA Discharge Planning Services: CM Consult Post Acute Care Choice: Durable Medical Equipment Living arrangements for the past 2 months: Single Family Home                 DME Arranged: Walker rolling DME Agency: [employer] Date DME Agency Contacted: 05/25/22 Time DME Agency Contacted: 76 Representative spoke with at DME Agency: Andee Poles            Prior Living Arrangements/Services Living arrangements for the past 2 months: Single Family Home Lives with:: Spouse Patient language and need for interpreter reviewed:: Yes Do you feel safe going back to the place where you live?: Yes      Need for Family Participation in Patient Care: No  (Comment) Care giver support system in place?: No (comment)   Criminal Activity/Legal Involvement Pertinent to Current Situation/Hospitalization: No - Comment as needed  Activities of Daily Living Home Assistive Devices/Equipment: Eyeglasses, CBG Meter (blood pressure monitor) ADL Screening (condition at time of admission) Patient's cognitive ability adequate to safely complete daily activities?: Yes Is the patient deaf or have difficulty hearing?: No Does the patient have difficulty seeing, even when wearing glasses/contacts?: No Does the patient have difficulty concentrating, remembering, or making decisions?: No Patient able to express need for assistance with ADLs?: Yes Does the patient have difficulty dressing or bathing?: No Independently performs ADLs?: Yes (appropriate for developmental age) Does the patient have difficulty walking or climbing stairs?: Yes (gets sob) Weakness of Legs: Left (left knee is weak, hx of right knee replacement) Weakness of Arms/Hands: None  Permission Sought/Granted   Permission granted to share information with : No              Emotional Assessment Appearance:: Appears stated age Attitude/Demeanor/Rapport: Engaged Affect (typically observed): Accepting Orientation: : Oriented to Self, Oriented to Place, Oriented to  Time, Oriented to Situation Alcohol / Substance Use: Not Applicable Psych Involvement: No (comment)  Admission diagnosis:  Acute kidney injury ([place]) [N17.9] Intraabdominal mass [R19.00] Acute kidney injury (AKI) with acute tubular necrosis (ATN) (HCC) [N17.0] Acute renal failure superimposed on stage 3a chronic kidney disease ([place]) [N17.9, N18.31] Patient Active Problem List   Diagnosis Date Noted  T[place]et fundoplicatiopn 05/15/2992 71/69/6789   Incarcerated hiatal hernia 05/21/2022   Acute kidney injury superimposed on chronic kidney disease ([place]) 05/20/2022   Abdominal mass 05/20/2022   Breast mass, left 06/27/2020    Obstructive sleep apnea 01/05/2020   Edema    GERD (gastroesophageal reflux disease)    Vitamin D deficiency 12/29/2019   Hyperlipidemia associated with type 2 diabetes mellitus ([place]) 12/29/2019   Allergic asthma, mild intermittent, uncomplicated 38/06/1750   Ductal carcinoma in situ (DCIS) of right breast 04/12/2017   Moderate osteopenia 02/21/2015   Thalassemia 02/21/2015   Diplopia 11/07/2014   History of breast cancer 05/18/2014   Allergic rhinitis 05/18/2014   Diabetes mellitus ([place]) 05/18/2014   Class 3 severe obesity with serious comorbidity and body mass index (BMI) of 45.0 to 49.9 in adult [facility]) 05/15/2014   Breast cancer, right breast ([place]) 03/16/2012   Iron deficiency anemia 05/22/2011   Essential hypertension 05/22/2011   PCP:  Shary Key, DO Pharmacy:   [facility] [place], [place] - [address] [place] [address] Phone: [phone] Fax: [phone]     Social Determinants of Health (SDOH) Interventions    Readmission Risk Interventions    05/25/2022   12:32 PM  Readmission Risk Prevention Plan  Transportation Screening Complete  PCP or Specialist Appt within 3-5 Days Complete  HRI or [place] Complete  Social Work Consult for [place] Planning/Counseling Complete  Palliative Care Screening Not Applicable  Medication Review [employer]) Complete

## 2022-05-25 NOTE — Progress Notes (Signed)
Physical Therapy Treatment Patient Details Name: [name] MRN: [id number] DOB: 12/20/1950 Today's Date: 05/25/2022   History of Present Illness 71 yo female admitted with gastric obstruction. S/P hernia repair with mesh 05/22/22. Hx of R TKA 2015, chronic L knee pain, obesity, DM, CKD, breast ca, OSA    PT Comments    POD # 3  General Comments: AxO x 3 very pleasant Lady.  Assisted OOB to amb went well.  General bed mobility comments: Min Assist for upper body with supine to sit and increased time to complete scooting to EOB   General transfer comment: Assist to rise, steady. Cues for safety, hand placement.  General Gait Details: tolerated an increased distance of 62 feet with walker with slightly forward flex posture due to recent ABD sugery.  hen Min Asisst with lower body to support B LE up onto bed.  Pt plans to return home with Spouse and will also have "some Nieces" who are going to help as well.   Recommendations for follow up therapy are one component of a multi-disciplinary discharge planning process, led by the attending physician.  Recommendations may be updated based on patient status, additional functional criteria and insurance authorization.  Follow Up Recommendations  No PT follow up (pt declines need for [facility])     Assistance Recommended at Discharge Intermittent Supervision/Assistance  Patient can return home with the following A little help with walking and/or transfers;A little help with bathing/dressing/bathroom;Assistance with cooking/housework;Assist for transportation;Help with stairs or ramp for entrance   Equipment Recommendations  Rolling walker (2 wheels)    Recommendations for Other Services       Precautions / Restrictions Precautions Precaution Comments: R JP Drain, ABD surgery Restrictions Weight Bearing Restrictions: No     Mobility  Bed Mobility Overal bed mobility: Needs Assistance Bed Mobility: Supine to Sit, Sit to Supine     Supine to  sit: Min assist Sit to supine: Min assist   General bed mobility comments: Min Assist for upper body with supine to sit and increased time to complete scooting to EOB then Min Asisst with lower body to support B LE up onto bed.    Transfers Overall transfer level: Needs assistance Equipment used: Rolling walker (2 wheels) Transfers: Sit to/from Stand Sit to Stand: Supervision, Min guard           General transfer comment: Assist to rise, steady. Cues for safety, hand placement.    Ambulation/Gait Ambulation/Gait assistance: Min guard, Supervision Gait Distance (Feet): 62 Feet Assistive device: Rolling walker (2 wheels) Gait Pattern/deviations: Step-through pattern, Decreased stride length Gait velocity: decreased     General Gait Details: tolerated an increased distance of 62 feet with walker with slightly forward flex posture due to recent ABD sugery.   Stairs             Wheelchair Mobility    Modified Rankin (Stroke Patients Only)       Balance                                            Cognition Arousal/Alertness: Awake/alert Behavior During Therapy: WFL for tasks assessed/performed Overall Cognitive Status: Within Functional Limits for tasks assessed                                 General  Comments: AxO x 3 very pleasant Lady        Exercises      General Comments        Pertinent Vitals/Pain Pain Assessment Pain Assessment: Faces Faces Pain Scale: Hurts little more Pain Location: abdomen after amb Pain Descriptors / Indicators: Discomfort, Sore, Tightness, Operative site guarding Pain Intervention(s): Monitored during session, Repositioned    Home Living                          Prior Function            PT Goals (current goals can now be found in the care plan section) Progress towards PT goals: Progressing toward goals    Frequency    Min 3X/week      PT Plan Current plan  remains appropriate    Co-evaluation              AM-PAC PT "6 Clicks" Mobility   Outcome Measure  Help needed turning from your back to your side while in a flat bed without using bedrails?: A Little Help needed moving from lying on your back to sitting on the side of a flat bed without using bedrails?: A Little Help needed moving to and from a bed to a chair (including a wheelchair)?: A Little Help needed standing up from a chair using your arms (e.g., wheelchair or bedside chair)?: A Little Help needed to walk in hospital room?: A Little Help needed climbing 3-5 steps with a railing? : A Little 6 Click Score: 18    End of Session Equipment Utilized During Treatment: Gait belt Activity Tolerance: Patient tolerated treatment well Patient left: in bed;with bed alarm set;with call bell/phone within reach Nurse Communication: Mobility status PT Visit Diagnosis: Difficulty in walking, not elsewhere classified (R26.2);Pain;Muscle weakness (generalized) (M62.81)     Time: 9767-3419 PT Time Calculation (min) (ACUTE ONLY): 18 min  Charges:  $Gait Training: 8-22 mins                     Rica Koyanagi  PTA [place] Office M-F          [phone] Weekend pager [phone]

## 2022-05-25 NOTE — Care Management Important Message (Signed)
Important Message  Patient Details IM Letter given to the Patient. Name: [name] MRN: [id number] Date of Birth: 12/29/1950   Medicare Important Message Given:  Yes     Kerin Salen 05/25/2022, 10:43 AM

## 2022-05-25 NOTE — Progress Notes (Signed)
PROGRESS NOTE   [name]  IOX:[id number]    DOB: [birth year]    DOA: 05/20/2022  PCP: Shary Key, DO   I have briefly reviewed patients previous medical records in [facility].  Chief Complaint  Patient presents with   Emesis   Hypoxia    Brief Narrative:  71 year old female with PMH of breast cancer s/p right lumpectomy and radiation, stage IIIb CKD, type II DM, GERD, large hiatal hernia, HLD, HTN, iron deficiency anemia, morbid obesity, OSA and anemia, presented to the ED on 05/20/2022 for evaluation of acute kidney injury.  She was known to have a large hiatal hernia with intermittent nausea and vomiting, symptoms got progressively worse in the week PTA with progressive nausea and vomiting and unable to keep anything down.  Has a outpatient, she had a upper GI series that showed a possible mass so CT abdomen and pelvis was ordered.  Serum creatinine was checked and was over 4.  She was also noted to be hypoxic in the 80s.  Admitted for incarcerated hiatal hernia with volvulus causing total obstruction, abdominal mass/teratoma likely an incidental finding, acute kidney injury.  General surgery was consulted and s/p paraesophageal hernia repair by Dr. Johney Maine on 8/25.  Remains n.p.o., difficult IV access, IR consulted for central line placement.   Assessment & Plan:  Principal Problem:   Incarcerated hiatal hernia Active Problems:   Class 3 severe obesity with serious comorbidity and body mass index (BMI) of 45.0 to 49.9 in adult [facility])   Essential hypertension   History of breast cancer   Diabetes mellitus (HCC)   Allergic asthma, mild intermittent, uncomplicated   Hyperlipidemia associated with type 2 diabetes mellitus (HCC)   GERD (gastroesophageal reflux disease)   Obstructive sleep apnea   Acute kidney injury superimposed on chronic kidney disease ([place])   Abdominal mass   [place] fundoplicatiopn 2/68/3419   Incarcerated hiatal hernia with volvulus causing total  obstruction: General surgery consulted and s/p paraesophageal hernia repair on 8/25 by Dr. Johney Maine.  Management per general surgery.  Postop esophagogram 8/27: NG tube was in the mouth, removed.  Continue pain management, IV PPI, antiemetics, IV steroids.  As per [facility] follow-up, still with edema and GEJ (oral contrast from 8/27 still seen in distal esophagus on chest x-ray 8/28), replacing NG tube/may be by radiology, minimize IVF, as needed diuresis (would stop IV fluids but avoid diuresis given recent AKI).  Difficult IV access/LUE IV infiltration Overnight 8/27, left upper extremity IV infiltration with diffuse swelling.  Restricted right upper extremity use due to prior breast cancer surgery.  Given acute on chronic kidney disease, avoiding PICC line.  Consulted IR for central line placement.  Elevate left upper extremity.  Acute kidney injury complicating stage IIIb (not 3a) CKD: Baseline creatinine in the 1.4-1.5 range since 2021.  Presented with creatinine of 3.73.  Likely due to dehydration from poor oral intake, GI losses and home losartan and chlorthalidone use (meds held).  Treating with IV fluids and creatinine steadily improving, creatinine down to 1.89.  Continue to trend daily BMP.  Minimize IV fluids.  No hydronephrosis on CT.  Acute transaminitis: LFTs were unremarkable preop and abruptly increased to AST of 360 and ALT of 319 on 8/26.  Unclear etiology.  Did not notice any preop hypotension to suggest shock liver.?  Related to anesthesia.  Continue to slowly improve. Continue to trend daily CMP.  Further evaluation if worsens.  Abdominal mass/possible teratoma CT A/P 05/20/2022: "There is  a complex, well encapsulated, partial wall calcified cystic mass in the right mid abdomen containing the fatty, soft tissue components and to a lesser extent small areas of calcifications consistent with mesenteric teratoma measuring approximately 12 x 15 x 15.7 cm". Per general surgery, incidental  finding and no urgent need for surgery.  Essential hypertension: Holding home p.o. meds (carvedilol, chlorthalidone and losartan) due to n.p.o. and AKI.  Currently on IV metoprolol and controlled.  Change to oral carvedilol when tolerating p.o..  Reasonably controlled.  Type II with renal complication's L8V 8.2 on 8/26.  Tightly controlled CBGs and even had hypoglycemic episode on 8/27, likely due to NPO.  Change IVF to D5 containing fluids.  DC SSI for now and consider resuming when CBGs consistently >150 or 180.  No further hypoglycemic episodes.  Anemia: Stable.  GERD: Management as above for hiatal hernia.  PPI.  Asthma Stable without bronchospasm.  As needed albuterol nebs.  OSA on CPAP Patient declined CPAP.  Breast cancer, s/p lumpectomy, s/p XRT See below under CT incidental findings.  Incidental findings on CT A/P 8/23 that need further evaluation, as outpatient as deemed necessary: - Questionable mild thickening of the ascending and transverse colonic wall.  Severe diverticulosis of sigmoid colon.  Colonoscopy if not already done recently - Enlarged uterus for age containing multiple fibroids with some fibroid showing popcorn calcifications.  Outpatient GYN consultation -Apparent enlargement of pancreatic head measuring 4.9 x 4.1 cm.  CT/MRI of the abdomen per the pancreatic mass protocol. - 1.2 cm nodular density in the retroareolar area lateral left breast.  Needs ultrasound/mammogram.  Body mass index is 48.01 kg/m./Morbid obesity  Nutritional Status Nutrition Problem: Increased nutrient needs Etiology: post-op healing Signs/Symptoms: estimated needs Interventions: Refer to RD note for recommendations  DVT prophylaxis: enoxaparin (LOVENOX) injection 30 mg Start: 05/20/22 2200     Code Status: Full Code:  Family Communication: None at bedside Disposition:  Status is: Inpatient Remains inpatient appropriate because: Postop progress.  Still n.p.o., IV fluids  etc.     Consultants:   General surgery  Procedures:   As above  Antimicrobials:      Subjective:  Left upper extremity swelling overnight.  Not painful.  Able to move fingers and hand.  Feels like she is going to have a BM but has not had one yet.  Objective:   Vitals:   05/24/22 0906 05/24/22 1337 05/24/22 2236 05/25/22 0512  BP:  (!) 137/58 (!) 149/74 (!) 154/77  Pulse: 80 85 81 79  Resp:  18 (!) 21 (!) 22  Temp:  97.8 F (36.6 C) 98.2 F (36.8 C) 97.6 F (36.4 C)  TempSrc:  Oral Oral Oral  SpO2: 91% 92% 92% 90%  Weight:      Height:        General exam: Elderly female, moderately built and morbidly obese lying comfortably propped up in bed without distress.  Looks more comfortable than she did yesterday. Respiratory system: Clear to auscultation.  No increased work of breathing. Cardiovascular system: S1 & S2 heard, RRR. No JVD, murmurs, rubs, gallops or clicks. No pedal edema.   Gastrointestinal system: Abdomen is nondistended, soft and nontender.JP drain with 80 mL output yesterday.  Minimal bowel sounds. Central nervous system: Alert and oriented. No focal neurological deficits. Extremities: Symmetric 5 x 5 power.  No leg edema.  Left upper extremity diffusely swollen from shoulder to fingers.  Compartments soft.  Good distal pulses.  Able to move fingers. Skin: No rashes, lesions  or ulcers Psychiatry: Judgement and insight appear normal. Mood & affect appropriate.     Data Reviewed:   I have personally reviewed following labs and imaging studies   CBC: Recent Labs  Lab 05/20/22 1659 05/21/22 0500 05/23/22 0559 05/24/22 0734 05/25/22 0510  WBC 5.8   < > 8.5 7.4 6.0  NEUTROABS 4.3  --   --   --   --   HGB 12.7   < > 12.2 11.7* 11.6*  HCT 43.6   < > 43.4 41.6 40.6  MCV 83.0   < > 87.1 86.1 85.8  PLT 208   < > 181 164 139*   < > = values in this interval not displayed.    Basic Metabolic Panel: Recent Labs  Lab 05/21/22 0500 05/22/22 0527  05/23/22 0559 05/24/22 0734 05/25/22 0510  NA 143 143 144 144 146*  K 4.4 4.0 4.8 4.2 4.7  CL 109 108 108 112* 114*  CO2 '28 28 26 25 26  '$ GLUCOSE 87 92 128* 86 186*  BUN 46* 39* 39* 41* 38*  CREATININE 3.16* 2.59* 2.32* 2.24* 1.89*  CALCIUM 8.8* 9.2 8.3* 7.4* 7.8*    Liver Function Tests: Recent Labs  Lab 05/20/22 1659 05/21/22 0500 05/23/22 0559 05/24/22 0734 05/25/22 0510  AST 14* 24 360* 248* 194*  ALT 15 11 319* 290* 278*  ALKPHOS 61 51 50 45 44  BILITOT 0.4 1.3* 0.9 0.9 0.9  PROT 7.4 6.5 6.6 6.2* 6.2*  ALBUMIN 3.6 3.1* 3.0* 2.9* 2.9*    CBG: Recent Labs  Lab 05/24/22 1654 05/24/22 2232 05/25/22 0509  GLUCAP 113* 158* 168*    Microbiology Studies:   Recent Results (from the past 240 hour(s))  SARS Coronavirus 2 by RT PCR (hospital order, performed in [facility] hospital lab) *cepheid single result test* Anterior Nasal Swab     Status: None   Collection Time: 05/20/22  5:00 PM   Specimen: Anterior Nasal Swab  Result Value Ref Range Status   SARS Coronavirus 2 by RT PCR NEGATIVE NEGATIVE Final    Comment: (NOTE) SARS-CoV-2 target nucleic acids are NOT DETECTED.  The SARS-CoV-2 RNA is generally detectable in upper and lower respiratory specimens during the acute phase of infection. The lowest concentration of SARS-CoV-2 viral copies this assay can detect is 250 copies / mL. A negative result does not preclude SARS-CoV-2 infection and should not be used as the sole basis for treatment or other patient management decisions.  A negative result may occur with improper specimen collection / handling, submission of specimen other than nasopharyngeal swab, presence of viral mutation(s) within the areas targeted by this assay, and inadequate number of viral copies (<250 copies / mL). A negative result must be combined with clinical observations, patient history, and epidemiological information.  Fact Sheet for Patients:    https://www.patel.info/  Fact Sheet for Healthcare Providers: https://hall.com/  This test is not yet approved or  cleared by the [place] FDA and has been authorized for detection and/or diagnosis of SARS-CoV-2 by FDA under an Emergency Use Authorization (EUA).  This EUA will remain in effect (meaning this test can be used) for the duration of the COVID-19 declaration under Section 564(b)(1) of the Act, 21 U.S.C. section 360bbb-3(b)(1), unless the authorization is terminated or revoked sooner.  Performed at [facility], [place] [address] [place],  [address]     Radiology Studies:  DG CHEST PORT 1 VIEW  Result Date: 05/25/2022 CLINICAL DATA:  Shortness of  breath. History of asthma. Recent fundoplication for hiatal hernia. EXAM: PORTABLE CHEST 1 VIEW COMPARISON:  Swallowing study done yesterday.  05/20/2022. FINDINGS: S heart size is normal. The patient has not taken a deep inspiration. Mild volume loss at the lung bases related to that. Surgical drain in the upper abdomen. There is some free air beneath the diaphragm, presumably secondary to the recent surgery. Previously administered contrast is visible in the distal esophagus. IMPRESSION: Previously administered oral contrast remains visible in the distal esophagus. Surgical drain in the upper abdomen. Some free intraperitoneal air remains visible, presumably subsequent to the previous surgery. Poor inspiration.  Mild volume loss at the lung bases. Electronically Signed   By: Nelson Chimes M.D.   On: 05/25/2022 08:51   DG ESOPHAGUS W SINGLE CM (SOL OR THIN BA)  Result Date: 05/24/2022 CLINICAL DATA:  Two days postop from fundoplication for large hiatal hernia. EXAM: ESOPHAGRAM WITH WATER-SOLUBLE CONTRAST TECHNIQUE: Single contrast examination was performed using water-soluble Omnipaque 300 contrast. This exam was performed by Durenda Guthrie, PA-C, and was supervised and  interpreted by Earle Gell, MD. FLUOROSCOPY: Radiation Exposure Index (as provided by the fluoroscopic device): 22.6 mGy Kerma COMPARISON:  None Available. FINDINGS: Scout image shows a left abdominal surgical drain, and pre-existing contrast material in the left colon which is attributable to prior CT. In addition, a nasogastric tube is seen which is looped back on itself in the proximal thoracic esophagus. The patient vomited during the exam, and the nasogastric tube came out. Patient drank 50 mL of Omnipaque 300. The esophagus is mildly dilated, and there is new complete obstruction of the distal esophagus with a "bird beak" appearance. Only a tiny amount of contrast passes into the stomach. No evidence of contrast leak or extravasation. Patient vomited, and the NG tube came into her mouth. The NG tube was therefore removed. IMPRESSION: Near-complete obstruction of the distal esophagus, with only a very small amount of contrast passing into the stomach. No evidence of contrast leak or extravasation. The patient vomited after drinking oral contrast contrast, and the nasogastric tube came out of her mouth, and was therefore removed. These results will be called to the ordering clinician or representative by the Radiologist Assistant, and communication documented in the PACS or [employer]. Electronically Signed   By: Marlaine Hind M.D.   On: 05/24/2022 11:29    Scheduled Meds:    Chlorhexidine Gluconate Cloth  6 each Topical Once   dexamethasone (DECADRON) injection  4 mg Intravenous Q12H   enoxaparin (LOVENOX) injection  30 mg Subcutaneous Q24H   insulin aspart  0-9 Units Subcutaneous TID WC   metoprolol tartrate  2.5 mg Intravenous Q8H   pantoprazole (PROTONIX) IV  40 mg Intravenous Daily   sodium chloride flush  3 mL Intravenous Q12H   sodium chloride flush  3 mL Intravenous Q12H   sodium chloride flush  3 mL Intravenous Q12H    Continuous Infusions:    sodium chloride     sodium chloride      acetaminophen     dextrose 5% lactated ringers Stopped (05/25/22 0634)   lactated ringers     ondansetron (ZOFRAN) IV       LOS: 4 days     Vernell Leep, MD,  FACP, [facility], [facility], [facility] (Care Management Physician Certified) [place]  To contact the attending provider between 7A-7P or the covering provider during after hours 7P-7A, please log into the web site [email or web address] and access  using universal [place] password for that web site. If you do not have the password, please call the hospital operator.  05/25/2022, 9:11 AM

## 2022-05-25 NOTE — Progress Notes (Signed)
Consult for PIV restart. R arm restricted due to hx of R mastectomy with lymph node removal. L arm with 3+ edema from shoulder to finger tip. Currently has PIV in R wrist placed by MD. Alroy Dust RN notified to contact MD for further orders.

## 2022-05-25 NOTE — Progress Notes (Addendum)
[facility] Progress Note  3 Days Post-Op  Subjective: CC-  Vomited during UGI yesterday and NG came out. Denies any n/v since then. No flatus or BM. Pain well controlled with IV tylenol.  Complaining of some upper airway congestion and intermittent SOB. On 1L [place]. Pulling 600 on IS. Denies productive cough. Also had some new LUE swelling over night. IV removed.  Objective: Vital signs in last 24 hours: Temp:  [97.6 F (36.4 C)-98.2 F (36.8 C)] 97.6 F (36.4 C) (08/28 0512) Pulse Rate:  [79-85] 79 (08/28 0512) Resp:  [18-22] 22 (08/28 0512) BP: (137-154)/(58-77) 154/77 (08/28 0512) SpO2:  [90 %-92 %] 90 % (08/28 0512) Last BM Date : 05/24/22  Intake/Output from previous day: 08/27 0701 - 08/28 0700 In: 49 [I.V.:9] Out: 980 [Urine:900; Drains:80] Intake/Output this shift: No intake/output data recorded.  PE: Gen:  Alert, NAD, pleasant Pulm: rate and effort normal Abd: Soft, nondistended, nontender, lap incisions cdi, JP drain with scant serosanguinous fluid in bulb LUE: some swelling present but compartments compressible and nontender  Lab Results:  Recent Labs    05/24/22 0734 05/25/22 0510  WBC 7.4 6.0  HGB 11.7* 11.6*  HCT 41.6 40.6  PLT 164 139*   BMET Recent Labs    05/24/22 0734 05/25/22 0510  NA 144 146*  K 4.2 4.7  CL 112* 114*  CO2 25 26  GLUCOSE 86 186*  BUN 41* 38*  CREATININE 2.24* 1.89*  CALCIUM 7.4* 7.8*   PT/INR No results for input(s): "LABPROT", "INR" in the last 72 hours. CMP     Component Value Date/Time   NA 146 (H) 05/25/2022 0510   NA 141 12/10/2020 1027   NA 143 04/12/2017 0939   K 4.7 05/25/2022 0510   K 4.2 04/12/2017 0939   CL 114 (H) 05/25/2022 0510   CL 104 02/02/2013 1127   CO2 26 05/25/2022 0510   CO2 30 (H) 04/12/2017 0939   GLUCOSE 186 (H) 05/25/2022 0510   GLUCOSE 70 04/12/2017 0939   GLUCOSE 95 02/02/2013 1127   BUN 38 (H) 05/25/2022 0510   BUN 25 12/10/2020 1027   BUN 20.5 04/12/2017 0939    CREATININE 1.89 (H) 05/25/2022 0510   CREATININE 1.2 (H) 04/12/2017 0939   CALCIUM 7.8 (L) 05/25/2022 0510   CALCIUM 9.8 04/12/2017 0939   PROT 6.2 (L) 05/25/2022 0510   PROT 6.7 12/10/2020 1027   PROT 7.0 04/12/2017 0939   ALBUMIN 2.9 (L) 05/25/2022 0510   ALBUMIN 3.8 12/10/2020 1027   ALBUMIN 3.3 (L) 04/12/2017 0939   AST 194 (H) 05/25/2022 0510   AST 15 04/12/2017 0939   ALT 278 (H) 05/25/2022 0510   ALT 25 04/12/2017 0939   ALKPHOS 44 05/25/2022 0510   ALKPHOS 67 04/12/2017 0939   BILITOT 0.9 05/25/2022 0510   BILITOT 0.2 12/10/2020 1027   BILITOT 0.53 04/12/2017 0939   GFRNONAA 28 (L) 05/25/2022 0510   GFRAA 35 (L) 11/18/2020 1544   Lipase     Component Value Date/Time   LIPASE 44 05/20/2022 1659       Studies/Results: DG ESOPHAGUS W SINGLE CM (SOL OR THIN BA)  Result Date: 05/24/2022 CLINICAL DATA:  Two days postop from fundoplication for large hiatal hernia. EXAM: ESOPHAGRAM WITH WATER-SOLUBLE CONTRAST TECHNIQUE: Single contrast examination was performed using water-soluble Omnipaque 300 contrast. This exam was performed by Durenda Guthrie, PA-C, and was supervised and interpreted by Earle Gell, MD. FLUOROSCOPY: Radiation Exposure Index (as provided by the fluoroscopic device): 22.6  mGy Kerma COMPARISON:  None Available. FINDINGS: Scout image shows a left abdominal surgical drain, and pre-existing contrast material in the left colon which is attributable to prior CT. In addition, a nasogastric tube is seen which is looped back on itself in the proximal thoracic esophagus. The patient vomited during the exam, and the nasogastric tube came out. Patient drank 50 mL of Omnipaque 300. The esophagus is mildly dilated, and there is new complete obstruction of the distal esophagus with a "bird beak" appearance. Only a tiny amount of contrast passes into the stomach. No evidence of contrast leak or extravasation. Patient vomited, and the NG tube came into her mouth. The NG tube was  therefore removed. IMPRESSION: Near-complete obstruction of the distal esophagus, with only a very small amount of contrast passing into the stomach. No evidence of contrast leak or extravasation. The patient vomited after drinking oral contrast contrast, and the nasogastric tube came out of her mouth, and was therefore removed. These results will be called to the ordering clinician or representative by the Radiologist Assistant, and communication documented in the PACS or [employer]. Electronically Signed   By: Marlaine Hind M.D.   On: 05/24/2022 11:29    Anti-infectives: Anti-infectives (From admission, onward)    Start     Dose/Rate Route Frequency Ordered Stop   05/22/22 0947  sodium chloride 0.9 % with cefTRIAXone (ROCEPHIN) ADS Med       Note to Pharmacy: Kyra Leyland E: cabinet override      05/22/22 0947 05/22/22 2159   05/22/22 0947  metroNIDAZOLE (FLAGYL) 500 MG/100ML IVPB       Note to Pharmacy: Kyra Leyland E: cabinet override      05/22/22 0947 05/22/22 1242   05/21/22 1300  cefTRIAXone (ROCEPHIN) 2 g in sodium chloride 0.9 % 100 mL IVPB  Status:  Discontinued       See Hyperspace for full Linked Orders Report.   2 g 200 mL/hr over 30 Minutes Intravenous On call to O.R. 05/21/22 1250 05/22/22 0559   05/21/22 1300  metroNIDAZOLE (FLAGYL) IVPB 500 mg  Status:  Discontinued       See Hyperspace for full Linked Orders Report.   500 mg 100 mL/hr over 60 Minutes Intravenous On call to O.R. 05/21/22 1250 05/22/22 0559        Assessment/Plan POD#3 PEH repair-Gross 8/25 -UGI 8/27 with near-complete obstruction of the distal esophagus, with only a very small amount of contrast passing into the stomach; no evidence of contrast leak -NG fell out yesterday, ok to leave out unless she gets n/v -continue IV decadron and NPO -plan to repeat UGI 8/30. Consider TPN then if unable to advance diet -continue IV tylenol for pain control -continue PT/mobilize -check CXR with  SOB, and LUE u/s to r/o DVT  ID - rocephin/flagyl 8/24>>8/25 FEN - IVF, NPO VTE - lovenox Foley - none  AKI on CKD - Cr improving, 1.89 OSA Obesity HTN DM GERD Asthma H/O breast cancer    LOS: 4 days    [name], [facility] 05/25/2022, 8:17 AM Please see Amion for pager number during day hours 7:00am-4:30pm

## 2022-05-25 NOTE — Procedures (Signed)
Central Venous Catheter Insertion Procedure Note  VINIA JEMMOTT  [id number]  1951/07/24  Date:05/25/22  Time:2:33 PM   Provider Performing:Shae Hinnenkamp A Rejina Odle   Procedure: Insertion of Non-tunneled Central Venous [phone]) with US guidance ([id number])   Indication(s) Difficult access  Consent Risks of the procedure as well as the alternatives and risks of each were explained to the patient and/or caregiver.  Consent for the procedure was obtained and is signed in the bedside chart  Anesthesia Topical only with 1% lidocaine   Timeout Verified patient identification, verified procedure, site/side was marked, verified correct patient position, special equipment/implants available, medications/allergies/relevant history reviewed, required imaging and test results available.  Sterile Technique Maximal sterile technique including full sterile barrier drape, hand hygiene, sterile gown, sterile gloves, mask, hair covering, sterile ultrasound probe cover (if used).  Procedure Description Area of catheter insertion was cleaned with chlorhexidine and draped in sterile fashion.  With real-time ultrasound guidance a central venous catheter was placed into the left internal jugular vein. Nonpulsatile blood flow and easy flushing noted in all ports.  The catheter was sutured in place and sterile dressing applied.  Complications/Tolerance None; patient tolerated the procedure well. Chest X-ray is ordered to verify placement for internal jugular or subclavian cannulation.   Chest x-ray is not ordered for femoral cannulation.  EBL Minimal  Specimen(s) None

## 2022-05-26 DIAGNOSIS — E1169 Type 2 diabetes mellitus with other specified complication: Secondary | ICD-10-CM | POA: Diagnosis not present

## 2022-05-26 DIAGNOSIS — K440 Diaphragmatic hernia with obstruction, without gangrene: Secondary | ICD-10-CM | POA: Diagnosis not present

## 2022-05-26 DIAGNOSIS — I10 Essential (primary) hypertension: Secondary | ICD-10-CM | POA: Diagnosis not present

## 2022-05-26 DIAGNOSIS — N179 Acute kidney failure, unspecified: Secondary | ICD-10-CM | POA: Diagnosis not present

## 2022-05-26 LAB — COMPREHENSIVE METABOLIC PANEL
ALT: 209 U/L — ABNORMAL HIGH (ref 0–44)
AST: 90 U/L — ABNORMAL HIGH (ref 15–41)
Albumin: 2.8 g/dL — ABNORMAL LOW (ref 3.5–5.0)
Alkaline Phosphatase: 39 U/L (ref 38–126)
Anion gap: 3 — ABNORMAL LOW (ref 5–15)
BUN: 36 mg/dL — ABNORMAL HIGH (ref 8–23)
CO2: 30 mmol/L (ref 22–32)
Calcium: 8.2 mg/dL — ABNORMAL LOW (ref 8.9–10.3)
Chloride: 114 mmol/L — ABNORMAL HIGH (ref 98–111)
Creatinine, Ser: 1.7 mg/dL — ABNORMAL HIGH (ref 0.44–1.00)
GFR, Estimated: 31 mL/min — ABNORMAL LOW (ref >60)
Glucose, Bld: 173 mg/dL — ABNORMAL HIGH (ref 70–99)
Potassium: 4.6 mmol/L (ref 3.5–5.1)
Sodium: 147 mmol/L — ABNORMAL HIGH (ref 135–145)
Total Bilirubin: 0.6 mg/dL (ref 0.3–1.2)
Total Protein: 6.2 g/dL — ABNORMAL LOW (ref 6.5–8.1)

## 2022-05-26 LAB — CBC
HCT: 38.7 % (ref 36.0–46.0)
Hemoglobin: 11.1 g/dL — ABNORMAL LOW (ref 12.0–15.0)
MCH: 24.2 pg — ABNORMAL LOW (ref 26.0–34.0)
MCHC: 28.7 g/dL — ABNORMAL LOW (ref 30.0–36.0)
MCV: 84.5 fL (ref 80.0–100.0)
Platelets: 151 10*3/uL (ref 150–400)
RBC: 4.58 MIL/uL (ref 3.87–5.11)
RDW: 16.5 % — ABNORMAL HIGH (ref 11.5–15.5)
WBC: 6.3 10*3/uL (ref 4.0–10.5)
nRBC: 0 % (ref 0.0–0.2)

## 2022-05-26 LAB — GLUCOSE, CAPILLARY
Glucose-Capillary: 133 mg/dL — ABNORMAL HIGH (ref 70–99)
Glucose-Capillary: 139 mg/dL — ABNORMAL HIGH (ref 70–99)
Glucose-Capillary: 151 mg/dL — ABNORMAL HIGH (ref 70–99)
Glucose-Capillary: 162 mg/dL — ABNORMAL HIGH (ref 70–99)
Glucose-Capillary: 166 mg/dL — ABNORMAL HIGH (ref 70–99)

## 2022-05-26 MED ORDER — DEXTROSE 5 % IV SOLN
INTRAVENOUS | Status: AC
Start: 2022-05-26 — End: 2022-05-27

## 2022-05-26 MED ORDER — SODIUM CHLORIDE 0.9% FLUSH
10.0000 mL | INTRAVENOUS | Status: DC | PRN
  Administered 2022-05-26 – 2022-06-01 (×2): 10 mL

## 2022-05-26 MED ORDER — SODIUM CHLORIDE 0.9% FLUSH
10.0000 mL | Freq: Two times a day (BID) | INTRAVENOUS | Status: DC
  Administered 2022-05-27 – 2022-06-01 (×8): 10 mL

## 2022-05-26 NOTE — Progress Notes (Signed)
Patient declines nocturnal CPAP. Equipment removed from room per patient request.

## 2022-05-26 NOTE — Progress Notes (Signed)
Pt vomited during UGI esophagram -tight head wrap.  Recommend IV Decadron twice daily for the next 72 hours to help with swelling.  Normally recommend diuresis but we are limited given the fact she came in and renal failure.  She is nonoliguric.  Hopefully can keep her on the dry side.  May be diuresis when she is doing better.  We will contrasted CT scan with IV contrast to evaluate 16 cm retroperitoneal mass per surgical oncology's recommendations.

## 2022-05-26 NOTE — Progress Notes (Signed)
Attempted to call report to [address] for pt to transfer to [phone]. Awaiting call back. Plan of care ongoing.

## 2022-05-26 NOTE — Progress Notes (Signed)
PROGRESS NOTE   [name]  FYB:[id number]    DOB: 02/19/1951    DOA: 05/20/2022  PCP: Shary Key, DO   I have briefly reviewed patients previous medical records in [facility].  Chief Complaint  Patient presents with   Emesis   Hypoxia    Brief Narrative:  71 year old female with PMH of breast cancer s/p right lumpectomy and radiation, stage IIIb CKD, type II DM, GERD, large hiatal hernia, HLD, HTN, iron deficiency anemia, morbid obesity, OSA and anemia, presented to the ED on 05/20/2022 for evaluation of acute kidney injury.  She was known to have a large hiatal hernia with intermittent nausea and vomiting, symptoms got progressively worse in the week PTA with progressive nausea and vomiting and unable to keep anything down.  Has a outpatient, she had a upper GI series that showed a possible mass so CT abdomen and pelvis was ordered.  Serum creatinine was checked and was over 4.  She was also noted to be hypoxic in the 80s.  Admitted for incarcerated hiatal hernia with volvulus causing total obstruction, abdominal mass/teratoma likely an incidental finding, acute kidney injury.  General surgery was consulted and s/p paraesophageal hernia repair by Dr. Johney Maine on 8/25.  Remains n.p.o., difficult IV access, IR consulted for central line placement.   Assessment & Plan:  Principal Problem:   Incarcerated hiatal hernia Active Problems:   Class 3 severe obesity with serious comorbidity and body mass index (BMI) of 45.0 to 49.9 in adult [facility])   Essential hypertension   History of breast cancer   Diabetes mellitus (HCC)   Allergic asthma, mild intermittent, uncomplicated   Hyperlipidemia associated with type 2 diabetes mellitus (HCC)   GERD (gastroesophageal reflux disease)   Obstructive sleep apnea   Acute kidney injury superimposed on chronic kidney disease ([place])   Abdominal mass   [place] fundoplicatiopn 02/22/7823   Incarcerated hiatal hernia with volvulus causing total  obstruction: General surgery consulted and s/p paraesophageal hernia repair on 8/25 by Dr. Johney Maine.  Management per general surgery.  Postop esophagogram 8/27: NG tube was in the mouth, removed.  Continue pain management, IV PPI, antiemetics, IV steroids.  As per [facility] follow-up, still with edema and GEJ (oral contrast from 8/27 still seen in distal esophagus on chest x-ray 8/28), NG was not placed, planning UGI in a.m. and if improved then considering advancing diet.  Given n.p.o., worsening hypernatremia, placed on gentle IV D5W.  Mobilize.  Difficult IV access/LUE IV infiltration Overnight 8/27, left upper extremity IV infiltration with diffuse swelling.  Restricted right upper extremity use due to prior breast cancer surgery.  Given acute on chronic kidney disease, avoiding PICC line.  PCCM placed central line.  LUE venous Doppler without DVT but showed acute SVT involving the left basilic vein and cephalic vein.  Supportive/symptomatic care.  Elevate extremity.  Swelling improving.  Acute kidney injury complicating stage IIIb (not 3a) CKD: Baseline creatinine in the 1.4-1.5 range since 2021.  Presented with creatinine of 3.73.  Likely due to dehydration from poor oral intake, GI losses and home losartan and chlorthalidone use (meds held).  No hydronephrosis on CT. with IV fluids, creatinine has gradually come down to 1.7.  However serum sodium over the last 2 days has increased to 147.  Remains n.p.o.  Started D5W at 50 mL/h.  Follow BMP in AM.  Acute transaminitis: LFTs were unremarkable preop and abruptly increased to AST of 360 and ALT of 319 on 8/26.  Unclear  etiology.  Did not notice any preop hypotension to suggest shock liver.?  Related to anesthesia.  Continues to slowly improve. Continue to trend daily CMP.  Further evaluation if worsens.  Abdominal mass/possible teratoma CT A/P 05/20/2022: "There is a complex, well encapsulated, partial wall calcified cystic mass in the right mid abdomen  containing the fatty, soft tissue components and to a lesser extent small areas of calcifications consistent with mesenteric teratoma measuring approximately 12 x 15 x 15.7 cm". Per general surgery, incidental finding and no urgent need for surgery.  Essential hypertension: Holding home p.o. meds (carvedilol, chlorthalidone and losartan) due to n.p.o. and AKI.  Currently on IV metoprolol and controlled.  Change to oral carvedilol when tolerating p.o..  Reasonably controlled.  Type II with renal complication's H0W 8.2 on 8/26.  Tightly controlled CBGs and even had hypoglycemic episode on 8/27, likely due to NPO.  Change IVF to D5 containing fluids.  DC SSI for now and consider resuming when CBGs consistently >150 or 180.  No further hypoglycemic episodes.  Anemia: Stable.  GERD: Management as above for hiatal hernia.  PPI.  Asthma Stable without bronchospasm.  As needed albuterol nebs.  OSA on CPAP Patient declined CPAP.  Breast cancer, s/p lumpectomy, s/p XRT See below under CT incidental findings.  Incidental findings on CT A/P 8/23 that need further evaluation, as outpatient as deemed necessary: - Questionable mild thickening of the ascending and transverse colonic wall.  Severe diverticulosis of sigmoid colon.  Colonoscopy if not already done recently - Enlarged uterus for age containing multiple fibroids with some fibroid showing popcorn calcifications.  Outpatient GYN consultation -Apparent enlargement of pancreatic head measuring 4.9 x 4.1 cm.  CT/MRI of the abdomen per the pancreatic mass protocol. - 1.2 cm nodular density in the retroareolar area lateral left breast.  Needs ultrasound/mammogram.  Body mass index is 46.7 kg/m./Morbid obesity  Nutritional Status Nutrition Problem: Increased nutrient needs Etiology: post-op healing Signs/Symptoms: estimated needs Interventions: Refer to RD note for recommendations  DVT prophylaxis: enoxaparin (LOVENOX) injection 40 mg  Start: 05/25/22 2200     Code Status: Full Code:  Family Communication: None at bedside Disposition:  Status is: Inpatient Remains inpatient appropriate because: Postop progress.  Still n.p.o., IV fluids etc.     Consultants:   General surgery PCCM  Procedures:   CVC placement 8/28.  Antimicrobials:      Subjective:  Left upper extremity swelling decreasing.  No pain.  Has some reflux symptoms but no nausea or vomiting.  Passing small amount of flatus but no BM but feels like she is going to have 1.  Reports ambulating once in the hallway yesterday.  Encouraged aggressive mobilization.  Objective:   Vitals:   05/25/22 1700 05/25/22 2007 05/26/22 0416 05/26/22 0500  BP: (!) 151/82 (!) 154/88 (!) 149/83   Pulse: 79 73 65   Resp: '17 18 16   '$ Temp:  97.7 F (36.5 C) (!) 97.5 F (36.4 C)   TempSrc:  Oral Oral   SpO2: 95% 93% 92%   Weight:    117.7 kg  Height:        General exam: Elderly female, moderately built and morbidly obese lying comfortably propped up in bed without distress.  Appears in good spirits. Respiratory system: Clear to auscultation.  No increased work of breathing. Cardiovascular system: S1 and S2 heard, RRR.  No JVD, murmurs or pedal edema. Gastrointestinal system: Abdomen is nondistended, soft and nontender.  Bowel sounds heard. Central nervous system: Alert  and oriented. No focal neurological deficits. Extremities: Symmetric 5 x 5 power.  No leg edema.  Has bilateral lower extremity SCDs.  Left upper extremity diffuse swelling definitely better than yesterday. Skin: No rashes, lesions or ulcers Psychiatry: Judgement and insight appear normal. Mood & affect appropriate.     Data Reviewed:   I have personally reviewed following labs and imaging studies   CBC: Recent Labs  Lab 05/20/22 1659 05/21/22 0500 05/24/22 0734 05/25/22 0510 05/26/22 0549  WBC 5.8   < > 7.4 6.0 6.3  NEUTROABS 4.3  --   --   --   --   HGB 12.7   < > 11.7* 11.6* 11.1*   HCT 43.6   < > 41.6 40.6 38.7  MCV 83.0   < > 86.1 85.8 84.5  PLT 208   < > 164 139* 151   < > = values in this interval not displayed.    Basic Metabolic Panel: Recent Labs  Lab 05/22/22 0527 05/23/22 0559 05/24/22 0734 05/25/22 0510 05/26/22 0549  NA 143 144 144 146* 147*  K 4.0 4.8 4.2 4.7 4.6  CL 108 108 112* 114* 114*  CO2 '28 26 25 26 30  '$ GLUCOSE 92 128* 86 186* 173*  BUN 39* 39* 41* 38* 36*  CREATININE 2.59* 2.32* 2.24* 1.89* 1.70*  CALCIUM 9.2 8.3* 7.4* 7.8* 8.2*    Liver Function Tests: Recent Labs  Lab 05/21/22 0500 05/23/22 0559 05/24/22 0734 05/25/22 0510 05/26/22 0549  AST 24 360* 248* 194* 90*  ALT 11 319* 290* 278* 209*  ALKPHOS 51 50 45 44 39  BILITOT 1.3* 0.9 0.9 0.9 0.6  PROT 6.5 6.6 6.2* 6.2* 6.2*  ALBUMIN 3.1* 3.0* 2.9* 2.9* 2.8*    CBG: Recent Labs  Lab 05/26/22 0536 05/26/22 0828 05/26/22 1157  GLUCAP 162* 139* 133*    Microbiology Studies:   Recent Results (from the past 240 hour(s))  SARS Coronavirus 2 by RT PCR (hospital order, performed in [facility] hospital lab) *cepheid single result test* Anterior Nasal Swab     Status: None   Collection Time: 05/20/22  5:00 PM   Specimen: Anterior Nasal Swab  Result Value Ref Range Status   SARS Coronavirus 2 by RT PCR NEGATIVE NEGATIVE Final    Comment: (NOTE) SARS-CoV-2 target nucleic acids are NOT DETECTED.  The SARS-CoV-2 RNA is generally detectable in upper and lower respiratory specimens during the acute phase of infection. The lowest concentration of SARS-CoV-2 viral copies this assay can detect is 250 copies / mL. A negative result does not preclude SARS-CoV-2 infection and should not be used as the sole basis for treatment or other patient management decisions.  A negative result may occur with improper specimen collection / handling, submission of specimen other than nasopharyngeal swab, presence of viral mutation(s) within the areas targeted by this assay, and inadequate  number of viral copies (<250 copies / mL). A negative result must be combined with clinical observations, patient history, and epidemiological information.  Fact Sheet for Patients:   https://www.patel.info/  Fact Sheet for Healthcare Providers: https://hall.com/  This test is not yet approved or  cleared by the [place] FDA and has been authorized for detection and/or diagnosis of SARS-CoV-2 by FDA under an Emergency Use Authorization (EUA).  This EUA will remain in effect (meaning this test can be used) for the duration of the COVID-19 declaration under Section 564(b)(1) of the Act, 21 U.S.C. section 360bbb-3(b)(1), unless the authorization is terminated or revoked  sooner.  Performed at [facility], [place] [address], [place], [place] [address]     Radiology Studies:  VAS [place] UPPER EXTREMITY VENOUS DUPLEX  Result Date: 05/25/2022 UPPER VENOUS STUDY  Patient Name:  [name]  Date of Exam:   05/25/2022 Medical Rec #: [id number]      Accession #:    [id number] Date of Birth: 31-May-1951      Patient Gender: F Patient Age:   43 years Exam Location:  [facility] Procedure:      VAS [place] UPPER EXTREMITY VENOUS DUPLEX Referring Phys: Margie Billet --------------------------------------------------------------------------------  Indications: Swelling Risk Factors: None identified. Comparison Study: No prior studies. Performing Technologist: Oliver Hum RVT  Examination Guidelines: A complete evaluation includes B-mode imaging, spectral Doppler, color Doppler, and power Doppler as needed of all accessible portions of each vessel. Bilateral testing is considered an integral part of a complete examination. Limited examinations for reoccurring indications may be performed as noted.  Right Findings: +----------+------------+---------+-----------+----------+-------+ RIGHT      CompressiblePhasicitySpontaneousPropertiesSummary +----------+------------+---------+-----------+----------+-------+ Subclavian    Full       Yes       Yes                      +----------+------------+---------+-----------+----------+-------+  Left Findings: +----------+------------+---------+-----------+----------+-------+ LEFT      CompressiblePhasicitySpontaneousPropertiesSummary +----------+------------+---------+-----------+----------+-------+ IJV           Full       Yes       Yes                      +----------+------------+---------+-----------+----------+-------+ Subclavian    Full       Yes       Yes                      +----------+------------+---------+-----------+----------+-------+ Axillary      Full       Yes       Yes                      +----------+------------+---------+-----------+----------+-------+ Brachial      Full       Yes       Yes                      +----------+------------+---------+-----------+----------+-------+ Radial        Full                                          +----------+------------+---------+-----------+----------+-------+ Ulnar         Full                                          +----------+------------+---------+-----------+----------+-------+ Cephalic      None                                   Acute  +----------+------------+---------+-----------+----------+-------+ Basilic       None                                   Acute  +----------+------------+---------+-----------+----------+-------+  Thrombus located in the cephalic vein is noted to be in the antecubital fossa. Thrombus located in the basilic vein is noted to be in the distal upper arm.  Summary:  Right: No evidence of thrombosis in the subclavian.  Left: No evidence of deep vein thrombosis in the upper extremity. Findings consistent with acute superficial vein thrombosis involving the left basilic vein and left cephalic vein.   *See table(s) above for measurements and observations.  Diagnosing physician: Servando Snare MD Electronically signed by Servando Snare MD on 05/25/2022 at 4:34:14 PM.    Final    DG Chest Port 1 View  Result Date: 05/25/2022 CLINICAL DATA:  Central line care EXAM: PORTABLE CHEST 1 VIEW COMPARISON:  May 25, 2022 FINDINGS: A left central line is been placed in the interval. The distal tip is in the central SVC. No pneumothorax. Layering effusion with underlying atelectasis on the left. Mild atelectasis in the right base. The cardiomediastinal silhouette is stable. IMPRESSION: Interval insertion of a left central line terminating in the central SVC without pneumothorax. Layering left effusion with underlying atelectasis. Mild right basilar atelectasis. Electronically Signed   By: Dorise Bullion III M.D.   On: 05/25/2022 15:13   DG CHEST PORT 1 VIEW  Result Date: 05/25/2022 CLINICAL DATA:  Shortness of breath. History of asthma. Recent fundoplication for hiatal hernia. EXAM: PORTABLE CHEST 1 VIEW COMPARISON:  Swallowing study done yesterday.  05/20/2022. FINDINGS: S heart size is normal. The patient has not taken a deep inspiration. Mild volume loss at the lung bases related to that. Surgical drain in the upper abdomen. There is some free air beneath the diaphragm, presumably secondary to the recent surgery. Previously administered contrast is visible in the distal esophagus. IMPRESSION: Previously administered oral contrast remains visible in the distal esophagus. Surgical drain in the upper abdomen. Some free intraperitoneal air remains visible, presumably subsequent to the previous surgery. Poor inspiration.  Mild volume loss at the lung bases. Electronically Signed   By: Nelson Chimes M.D.   On: 05/25/2022 08:51    Scheduled Meds:    Chlorhexidine Gluconate Cloth  6 each Topical Once   dexamethasone (DECADRON) injection  4 mg Intravenous Q12H   enoxaparin (LOVENOX) injection  40 mg Subcutaneous Q24H    insulin aspart  0-9 Units Subcutaneous TID WC   metoprolol tartrate  2.5 mg Intravenous Q8H   mouth rinse  15 mL Mouth Rinse 4 times per day   pantoprazole (PROTONIX) IV  40 mg Intravenous Daily   sodium chloride flush  10-40 mL Intracatheter Q12H   sodium chloride flush  3 mL Intravenous Q12H   sodium chloride flush  3 mL Intravenous Q12H   sodium chloride flush  3 mL Intravenous Q12H    Continuous Infusions:    sodium chloride     sodium chloride     ondansetron (ZOFRAN) IV       LOS: 5 days     Vernell Leep, MD,  FACP, [facility], [facility], [facility] (Care Management Physician Certified) [place]  To contact the attending provider between 7A-7P or the covering provider during after hours 7P-7A, please log into the web site [email or web address] and access using universal [place] password for that web site. If you do not have the password, please call the hospital operator.  05/26/2022, 1:46 PM

## 2022-05-26 NOTE — Progress Notes (Signed)
Mobility Specialist Cancellation/Refusal Note:   Reason for Cancellation/Refusal: Pt declined mobility at this time. NT assisting w/ ALDs.  Will check back as schedule permits.      [facility]

## 2022-05-26 NOTE — Progress Notes (Addendum)
[name] [id number] 08/08/1951  CARE TEAM:  PCP: Shary Key, DO  Outpatient Care Team: Patient Care Team: Shary Key, DO as PCP - General (Family Medicine) End, Harrell Gave, MD as PCP - Cardiology (Cardiology) Wilford Corner, MD as Consulting Physician (Gastroenterology)  Inpatient Treatment Team: Treatment Team: Attending Provider: Modena Jansky, MD; Consulting Physician: Edison Pace, Md, MD; Respiratory Therapist: Kellie Moor, RRT; Rounding Team: Joycelyn Das, MD; Technician: Brantley Persons, NT; Pharmacist: Polly Cobia, [facility]; Registered Nurse: Samuella Cota, RN; Mobility Specialist: Trevor Mace; Social Worker: Vassie Moselle, LCSW   Problem List:   Principal Problem:   Incarcerated hiatal hernia Active Problems:   Abdominal mass   Essential hypertension   Class 3 severe obesity with serious comorbidity and body mass index (BMI) of 45.0 to 49.9 in adult [facility])   History of breast cancer   Diabetes mellitus ([place])   Allergic asthma, mild intermittent, uncomplicated   Hyperlipidemia associated with type 2 diabetes mellitus ([place])   GERD (gastroesophageal reflux disease)   Obstructive sleep apnea   Acute kidney injury superimposed on chronic kidney disease ([place])   Toupet fundoplicatiopn 11/19/9796   4 Days Post-Op  05/22/2022  POST-OPERATIVE DIAGNOSIS:  Incarcerated hiatal hernia with volvulus and obstruction   PROCEDURE:   1. ROBOTIC reduction of paraesophageal hiatal hernia 2. Type II mediastinal dissection. 2.5 right and left diaphragm partial component separation/diaphragm release 3. Primary repair of hiatal hernia over pledgets.  4. Anterior & posterior gastropexy. 5. Toupet (partial posterior 270deg)fundoplication x3 cm  6. Mesh reinforcement with absorbable mesh   SURGEON:  Adin Hector, MD  OR FINDINGS:    Moderate-sized paraesophageal hiatal hernia with 100% of the stomach in the mediastinum.  There was a 9 x 8  cm hiatal defect.   It is a primary repair over pledgets. Mesh reinforcement was used with Mesh was used: PhasixT Mesh (a knitted monofilament mesh scaffold using Poly-4-hydroxybutyrate (P4HB), a biologically derived, fully resorbable material)   The patient has a partial posterior 270 degree Toupet fundoplication x3 cm.  The patient has had anterior and posterior gastropexy.   Assessment  Slowly recovering  [facility] Stay = 5 days)  Assessment/Plan POD#3 PEH repair-Dr. Johney Maine 8/25 -UGI 8/27 with near-complete obstruction of the distal esophagus, with only a very small amount of contrast passing into the stomach; no evidence of contrast leak  -continue IV decadron and NPO -Ideally would give Lasix for diuresis but came in dehydrated renal failure.  Creatinine starting to stabilize.  Keep on the dry side. -plan to repeat UGI 8/30. Consider TPN then if unable to advance diet -continue IV tylenol for pain control -continue PT/mobilize -CXR with no major effusion.  Drain output lower.  Remove drain. -Ultrasound shows known upper extremity venous thrombosis.  Mild cephalic vein thrombosis.  Most likely elevation and compresses.  Defer to primary service.  ID - rocephin/flagyl 8/24>>8/25 FEN - IVF, tried low volume sips of clears.  Upper GI tomorrow.  If no leak or obstruction, advance to dysphagia 1/pured diet.   VTE - lovenox Foley - none   AKI on CKD - Cr improving, 1.7 OSA Obesity HTN DM GERD Asthma H/O breast cancer -VTE prophylaxis- SCDs, etc -mobilize as tolerated to help recovery  I discussed operative findings, updated the patient's status, discussed probable steps to recovery, and gave postoperative recommendations to the patient.  Recommendations were made.  Questions were answered.  She expressed understanding & appreciation.   Disposition:  I reviewed nursing notes, hospitalist notes, last 24 h vitals and pain scores, last 48 h intake and output, last  24 h labs and trends, and last 24 h imaging results. I have reviewed this patient's available data, including medical history, events of note, test results, etc as part of my evaluation.  A significant portion of that time was spent in counseling.  Care during the described time interval was provided by me.  This care required moderate level of medical decision making.  05/26/2022    Subjective: (Chief complaint)  Patient feeling much better overall.  Tolerating ice chips and a few sips.  Mild phlegm but not spitting up all saliva.  Walking more.  Objective:  Vital signs:  Vitals:   05/25/22 1700 05/25/22 2007 05/26/22 0416 05/26/22 0500  BP: (!) 151/82 (!) 154/88 (!) 149/83   Pulse: 79 73 65   Resp: '17 18 16   '$ Temp:  97.7 F (36.5 C) (!) 97.5 F (36.4 C)   TempSrc:  Oral Oral   SpO2: 95% 93% 92%   Weight:    117.7 kg  Height:        Last BM Date : 05/24/22  Intake/Output   Yesterday:  08/28 0701 - 08/29 0700 In: 74.1 [IV Piggyback:74.1] Out: 750 [Urine:750] This shift:  No intake/output data recorded.  Bowel function:  Flatus: YES  BM:  No  Drain: Serosanguinous -no output recorded last 24 hours   Physical Exam:  General: Pt awake/alert in no acute distress.  Sitting up smiling.  Calm and chatty. Eyes: PERRL, normal EOM.  Sclera clear.  No icterus Neuro: CN II-XII intact w/o focal sensory/motor deficits. Lymph: No head/neck/groin lymphadenopathy Psych:  No delerium/psychosis/paranoia.  Oriented x 4 HENT: Normocephalic, Mucus membranes moist.  No thrush Neck: Supple, No tracheal deviation.  No obvious thyromegaly Chest: No pain to chest wall compression.  Good respiratory excursion.  No audible wheezing CV:  Pulses intact.  Regular rhythm.  No major extremity edema MS: Normal AROM mjr joints.  No obvious deformity  Abdomen: Soft.  Nondistended.  Mildly tender at incisions only.  No evidence of peritonitis.  No incarcerated hernias.  Ext:   No  deformity.  No mjr edema.  No cyanosis Skin: No petechiae / purpurea.  No major sores.  Warm and dry    Results:   Cultures: Recent Results (from the past 720 hour(s))  SARS Coronavirus 2 by RT PCR (hospital order, performed in [facility] hospital lab) *cepheid single result test* Anterior Nasal Swab     Status: None   Collection Time: 05/20/22  5:00 PM   Specimen: Anterior Nasal Swab  Result Value Ref Range Status   SARS Coronavirus 2 by RT PCR NEGATIVE NEGATIVE Final    Comment: (NOTE) SARS-CoV-2 target nucleic acids are NOT DETECTED.  The SARS-CoV-2 RNA is generally detectable in upper and lower respiratory specimens during the acute phase of infection. The lowest concentration of SARS-CoV-2 viral copies this assay can detect is 250 copies / mL. A negative result does not preclude SARS-CoV-2 infection and should not be used as the sole basis for treatment or other patient management decisions.  A negative result may occur with improper specimen collection / handling, submission of specimen other than nasopharyngeal swab, presence of viral mutation(s) within the areas targeted by this assay, and inadequate number of viral copies (<250 copies / mL). A negative result must be combined with clinical observations, patient history, and epidemiological information.  Fact Sheet for Patients:  https://www.patel.info/  Fact Sheet for Healthcare Providers: https://hall.com/  This test is not yet approved or  cleared by the [place] FDA and has been authorized for detection and/or diagnosis of SARS-CoV-2 by FDA under an Emergency Use Authorization (EUA).  This EUA will remain in effect (meaning this test can be used) for the duration of the COVID-19 declaration under Section 564(b)(1) of the Act, 21 U.S.C. section 360bbb-3(b)(1), unless the authorization is terminated or revoked sooner.  Performed at [facility],  [place] [address], [place], [place] [address]     Labs: Results for orders placed or performed during the hospital encounter of 05/20/22 (from the past 48 hour(s))  Glucose, capillary     Status: Abnormal   Collection Time: 05/24/22 11:29 AM  Result Value Ref Range   Glucose-Capillary 69 (L) 70 - 99 mg/dL    Comment: Glucose reference range applies only to samples taken after fasting for at least 8 hours.  Glucose, capillary     Status: None   Collection Time: 05/24/22 11:57 AM  Result Value Ref Range   Glucose-Capillary 86 70 - 99 mg/dL    Comment: Glucose reference range applies only to samples taken after fasting for at least 8 hours.  Glucose, capillary     Status: Abnormal   Collection Time: 05/24/22  4:54 PM  Result Value Ref Range   Glucose-Capillary 113 (H) 70 - 99 mg/dL    Comment: Glucose reference range applies only to samples taken after fasting for at least 8 hours.  Glucose, capillary     Status: Abnormal   Collection Time: 05/24/22 10:32 PM  Result Value Ref Range   Glucose-Capillary 158 (H) 70 - 99 mg/dL    Comment: Glucose reference range applies only to samples taken after fasting for at least 8 hours.   Comment 1 Notify RN    Comment 2 Document in Chart   Glucose, capillary     Status: Abnormal   Collection Time: 05/25/22  5:09 AM  Result Value Ref Range   Glucose-Capillary 168 (H) 70 - 99 mg/dL    Comment: Glucose reference range applies only to samples taken after fasting for at least 8 hours.   Comment 1 Notify RN    Comment 2 Document in Chart   CBC     Status: Abnormal   Collection Time: 05/25/22  5:10 AM  Result Value Ref Range   WBC 6.0 4.0 - 10.5 K/uL   RBC 4.73 3.87 - 5.11 MIL/uL   Hemoglobin 11.6 (L) 12.0 - 15.0 g/dL   HCT 40.6 36.0 - 46.0 %   MCV 85.8 80.0 - 100.0 fL   MCH 24.5 (L) 26.0 - 34.0 pg   MCHC 28.6 (L) 30.0 - 36.0 g/dL   RDW 16.4 (H) 11.5 - 15.5 %   Platelets 139 (L) 150 - 400 K/uL   nRBC 0.0 0.0 - 0.2 %    Comment: Performed at  [facility], [place] [address] [place], [place] [address]  Comprehensive metabolic panel     Status: Abnormal   Collection Time: 05/25/22  5:10 AM  Result Value Ref Range   Sodium 146 (H) 135 - 145 mmol/L   Potassium 4.7 3.5 - 5.1 mmol/L   Chloride 114 (H) 98 - 111 mmol/L   CO2 26 22 - 32 mmol/L   Glucose, Bld 186 (H) 70 - 99 mg/dL    Comment: Glucose reference range applies only to samples taken after fasting for at least  8 hours.   BUN 38 (H) 8 - 23 mg/dL   Creatinine, Ser 1.89 (H) 0.44 - 1.00 mg/dL   Calcium 7.8 (L) 8.9 - 10.3 mg/dL   Total Protein 6.2 (L) 6.5 - 8.1 g/dL   Albumin 2.9 (L) 3.5 - 5.0 g/dL   AST 194 (H) 15 - 41 U/L   ALT 278 (H) 0 - 44 U/L   Alkaline Phosphatase 44 38 - 126 U/L   Total Bilirubin 0.9 0.3 - 1.2 mg/dL   GFR, Estimated 28 (L) >60 mL/min    Comment: (NOTE) Calculated using the CKD-EPI Creatinine Equation (2021)    Anion gap 6 5 - 15    Comment: Performed at [facility], [place] [address], [place], [place] [address]  Glucose, capillary     Status: Abnormal   Collection Time: 05/25/22 12:37 PM  Result Value Ref Range   Glucose-Capillary 115 (H) 70 - 99 mg/dL    Comment: Glucose reference range applies only to samples taken after fasting for at least 8 hours.   Comment 1 Notify RN    Comment 2 Document in Chart   Glucose, capillary     Status: Abnormal   Collection Time: 05/25/22  6:54 PM  Result Value Ref Range   Glucose-Capillary 138 (H) 70 - 99 mg/dL    Comment: Glucose reference range applies only to samples taken after fasting for at least 8 hours.   Comment 1 Notify RN    Comment 2 Document in Chart   Glucose, capillary     Status: Abnormal   Collection Time: 05/26/22 12:00 AM  Result Value Ref Range   Glucose-Capillary 151 (H) 70 - 99 mg/dL    Comment: Glucose reference range applies only to samples taken after fasting for at least 8 hours.  Glucose, capillary     Status: Abnormal   Collection Time:  05/26/22  5:36 AM  Result Value Ref Range   Glucose-Capillary 162 (H) 70 - 99 mg/dL    Comment: Glucose reference range applies only to samples taken after fasting for at least 8 hours.  CBC     Status: Abnormal   Collection Time: 05/26/22  5:49 AM  Result Value Ref Range   WBC 6.3 4.0 - 10.5 K/uL   RBC 4.58 3.87 - 5.11 MIL/uL   Hemoglobin 11.1 (L) 12.0 - 15.0 g/dL   HCT 38.7 36.0 - 46.0 %   MCV 84.5 80.0 - 100.0 fL   MCH 24.2 (L) 26.0 - 34.0 pg   MCHC 28.7 (L) 30.0 - 36.0 g/dL   RDW 16.5 (H) 11.5 - 15.5 %   Platelets 151 150 - 400 K/uL   nRBC 0.0 0.0 - 0.2 %    Comment: Performed at [facility], [place] [address], [place], [place] [address]  Comprehensive metabolic panel     Status: Abnormal   Collection Time: 05/26/22  5:49 AM  Result Value Ref Range   Sodium 147 (H) 135 - 145 mmol/L   Potassium 4.6 3.5 - 5.1 mmol/L   Chloride 114 (H) 98 - 111 mmol/L   CO2 30 22 - 32 mmol/L   Glucose, Bld 173 (H) 70 - 99 mg/dL    Comment: Glucose reference range applies only to samples taken after fasting for at least 8 hours.   BUN 36 (H) 8 - 23 mg/dL   Creatinine, Ser 1.70 (H) 0.44 - 1.00 mg/dL   Calcium 8.2 (L) 8.9 - 10.3 mg/dL   Total Protein  6.2 (L) 6.5 - 8.1 g/dL   Albumin 2.8 (L) 3.5 - 5.0 g/dL   AST 90 (H) 15 - 41 U/L   ALT 209 (H) 0 - 44 U/L   Alkaline Phosphatase 39 38 - 126 U/L   Total Bilirubin 0.6 0.3 - 1.2 mg/dL   GFR, Estimated 31 (L) >60 mL/min    Comment: (NOTE) Calculated using the CKD-EPI Creatinine Equation (2021)    Anion gap 3 (L) 5 - 15    Comment: Performed at [facility], [place] [address], [place], [place] [address]    Imaging / Studies: VAS [place] UPPER EXTREMITY VENOUS DUPLEX  Result Date: 05/25/2022 UPPER VENOUS STUDY  Patient Name:  KIYONA MCNALL  Date of Exam:   05/25/2022 Medical Rec #: [id number]      Accession #:    [id number] Date of Birth: 1951-06-13      Patient Gender: F Patient Age:   71 years Exam Location:  [facility] Procedure:      VAS [place] UPPER EXTREMITY VENOUS DUPLEX Referring Phys: Margie Billet --------------------------------------------------------------------------------  Indications: Swelling Risk Factors: None identified. Comparison Study: No prior studies. Performing Technologist: Oliver Hum RVT  Examination Guidelines: A complete evaluation includes B-mode imaging, spectral Doppler, color Doppler, and power Doppler as needed of all accessible portions of each vessel. Bilateral testing is considered an integral part of a complete examination. Limited examinations for reoccurring indications may be performed as noted.  Right Findings: +----------+------------+---------+-----------+----------+-------+ RIGHT     CompressiblePhasicitySpontaneousPropertiesSummary +----------+------------+---------+-----------+----------+-------+ Subclavian    Full       Yes       Yes                      +----------+------------+---------+-----------+----------+-------+  Left Findings: +----------+------------+---------+-----------+----------+-------+ LEFT      CompressiblePhasicitySpontaneousPropertiesSummary +----------+------------+---------+-----------+----------+-------+ IJV           Full       Yes       Yes                      +----------+------------+---------+-----------+----------+-------+ Subclavian    Full       Yes       Yes                      +----------+------------+---------+-----------+----------+-------+ Axillary      Full       Yes       Yes                      +----------+------------+---------+-----------+----------+-------+ Brachial      Full       Yes       Yes                      +----------+------------+---------+-----------+----------+-------+ Radial        Full                                          +----------+------------+---------+-----------+----------+-------+ Ulnar         Full                                           +----------+------------+---------+-----------+----------+-------+ Cephalic  None                                   Acute  +----------+------------+---------+-----------+----------+-------+ Basilic       None                                   Acute  +----------+------------+---------+-----------+----------+-------+ Thrombus located in the cephalic vein is noted to be in the antecubital fossa. Thrombus located in the basilic vein is noted to be in the distal upper arm.  Summary:  Right: No evidence of thrombosis in the subclavian.  Left: No evidence of deep vein thrombosis in the upper extremity. Findings consistent with acute superficial vein thrombosis involving the left basilic vein and left cephalic vein.  *See table(s) above for measurements and observations.  Diagnosing physician: Servando Snare MD Electronically signed by Servando Snare MD on 05/25/2022 at 4:34:14 PM.    Final    DG Chest Port 1 View  Result Date: 05/25/2022 CLINICAL DATA:  Central line care EXAM: PORTABLE CHEST 1 VIEW COMPARISON:  May 25, 2022 FINDINGS: A left central line is been placed in the interval. The distal tip is in the central SVC. No pneumothorax. Layering effusion with underlying atelectasis on the left. Mild atelectasis in the right base. The cardiomediastinal silhouette is stable. IMPRESSION: Interval insertion of a left central line terminating in the central SVC without pneumothorax. Layering left effusion with underlying atelectasis. Mild right basilar atelectasis. Electronically Signed   By: Dorise Bullion III M.D.   On: 05/25/2022 15:13   DG CHEST PORT 1 VIEW  Result Date: 05/25/2022 CLINICAL DATA:  Shortness of breath. History of asthma. Recent fundoplication for hiatal hernia. EXAM: PORTABLE CHEST 1 VIEW COMPARISON:  Swallowing study done yesterday.  05/20/2022. FINDINGS: S heart size is normal. The patient has not taken a deep inspiration. Mild volume loss at the lung bases related to that.  Surgical drain in the upper abdomen. There is some free air beneath the diaphragm, presumably secondary to the recent surgery. Previously administered contrast is visible in the distal esophagus. IMPRESSION: Previously administered oral contrast remains visible in the distal esophagus. Surgical drain in the upper abdomen. Some free intraperitoneal air remains visible, presumably subsequent to the previous surgery. Poor inspiration.  Mild volume loss at the lung bases. Electronically Signed   By: Nelson Chimes M.D.   On: 05/25/2022 08:51   DG ESOPHAGUS W SINGLE CM (SOL OR THIN BA)  Result Date: 05/24/2022 CLINICAL DATA:  Two days postop from fundoplication for large hiatal hernia. EXAM: ESOPHAGRAM WITH WATER-SOLUBLE CONTRAST TECHNIQUE: Single contrast examination was performed using water-soluble Omnipaque 300 contrast. This exam was performed by Durenda Guthrie, PA-C, and was supervised and interpreted by Earle Gell, MD. FLUOROSCOPY: Radiation Exposure Index (as provided by the fluoroscopic device): 22.6 mGy Kerma COMPARISON:  None Available. FINDINGS: Scout image shows a left abdominal surgical drain, and pre-existing contrast material in the left colon which is attributable to prior CT. In addition, a nasogastric tube is seen which is looped back on itself in the proximal thoracic esophagus. The patient vomited during the exam, and the nasogastric tube came out. Patient drank 50 mL of Omnipaque 300. The esophagus is mildly dilated, and there is new complete obstruction of the distal esophagus with a "bird beak" appearance. Only a tiny amount of contrast passes into the stomach.  No evidence of contrast leak or extravasation. Patient vomited, and the NG tube came into her mouth. The NG tube was therefore removed. IMPRESSION: Near-complete obstruction of the distal esophagus, with only a very small amount of contrast passing into the stomach. No evidence of contrast leak or extravasation. The patient vomited after  drinking oral contrast contrast, and the nasogastric tube came out of her mouth, and was therefore removed. These results will be called to the ordering clinician or representative by the Radiologist Assistant, and communication documented in the PACS or [employer]. Electronically Signed   By: Marlaine Hind M.D.   On: 05/24/2022 11:29    Medications / Allergies: per chart  Antibiotics: Anti-infectives (From admission, onward)    Start     Dose/Rate Route Frequency Ordered Stop   05/22/22 0947  sodium chloride 0.9 % with cefTRIAXone (ROCEPHIN) ADS Med       Note to Pharmacy: Kyra Leyland E: cabinet override      05/22/22 0947 05/22/22 2159   05/22/22 0947  metroNIDAZOLE (FLAGYL) 500 MG/100ML IVPB       Note to Pharmacy: Kyra Leyland E: cabinet override      05/22/22 0947 05/22/22 1242   05/21/22 1300  cefTRIAXone (ROCEPHIN) 2 g in sodium chloride 0.9 % 100 mL IVPB  Status:  Discontinued       See Hyperspace for full Linked Orders Report.   2 g 200 mL/hr over 30 Minutes Intravenous On call to O.R. 05/21/22 1250 05/22/22 0559   05/21/22 1300  metroNIDAZOLE (FLAGYL) IVPB 500 mg  Status:  Discontinued       See Hyperspace for full Linked Orders Report.   500 mg 100 mL/hr over 60 Minutes Intravenous On call to O.R. 05/21/22 1250 05/22/22 0559         Note: Portions of this report may have been transcribed using voice recognition software. Every effort was made to ensure accuracy; however, inadvertent computerized transcription errors may be present.   Any transcriptional errors that result from this process are unintentional.    Adin Hector, MD, FACS, MASCRS Esophageal, Gastrointestinal & Colorectal Surgery Robotic and Minimally Invasive Surgery  [facility] [place]. [address], [place], [place] [address] [phone] Fax [phone] Main  CONTACT INFORMATION:  Weekday (9AM-5PM): Call [facility] main  office at [phone]  Weeknight (5PM-9AM) or Weekend/Holiday: Check [email or web address] (password " [email or web address]") for General Surgery [facility] coverage  (Please, do not use SecureChat as it is not reliable communication to reach operating surgeons for immediate patient care)      05/26/2022  8:17 AM

## 2022-05-27 ENCOUNTER — Inpatient Hospital Stay (HOSPITAL_COMMUNITY): Payer: Medicare PPO

## 2022-05-27 DIAGNOSIS — K440 Diaphragmatic hernia with obstruction, without gangrene: Secondary | ICD-10-CM | POA: Diagnosis not present

## 2022-05-27 LAB — CBC
HCT: 39.1 % (ref 36.0–46.0)
Hemoglobin: 11.5 g/dL — ABNORMAL LOW (ref 12.0–15.0)
MCH: 24.3 pg — ABNORMAL LOW (ref 26.0–34.0)
MCHC: 29.4 g/dL — ABNORMAL LOW (ref 30.0–36.0)
MCV: 82.5 fL (ref 80.0–100.0)
Platelets: 162 10*3/uL (ref 150–400)
RBC: 4.74 MIL/uL (ref 3.87–5.11)
RDW: 16.3 % — ABNORMAL HIGH (ref 11.5–15.5)
WBC: 6.4 10*3/uL (ref 4.0–10.5)
nRBC: 0 % (ref 0.0–0.2)

## 2022-05-27 LAB — COMPREHENSIVE METABOLIC PANEL
ALT: 162 U/L — ABNORMAL HIGH (ref 0–44)
AST: 52 U/L — ABNORMAL HIGH (ref 15–41)
Albumin: 2.9 g/dL — ABNORMAL LOW (ref 3.5–5.0)
Alkaline Phosphatase: 40 U/L (ref 38–126)
Anion gap: 7 (ref 5–15)
BUN: 38 mg/dL — ABNORMAL HIGH (ref 8–23)
CO2: 30 mmol/L (ref 22–32)
Calcium: 8.8 mg/dL — ABNORMAL LOW (ref 8.9–10.3)
Chloride: 112 mmol/L — ABNORMAL HIGH (ref 98–111)
Creatinine, Ser: 1.5 mg/dL — ABNORMAL HIGH (ref 0.44–1.00)
GFR, Estimated: 37 mL/min — ABNORMAL LOW (ref >60)
Glucose, Bld: 178 mg/dL — ABNORMAL HIGH (ref 70–99)
Potassium: 4.1 mmol/L (ref 3.5–5.1)
Sodium: 149 mmol/L — ABNORMAL HIGH (ref 135–145)
Total Bilirubin: 0.7 mg/dL (ref 0.3–1.2)
Total Protein: 6.4 g/dL — ABNORMAL LOW (ref 6.5–8.1)

## 2022-05-27 LAB — GLUCOSE, CAPILLARY
Glucose-Capillary: 135 mg/dL — ABNORMAL HIGH (ref 70–99)
Glucose-Capillary: 177 mg/dL — ABNORMAL HIGH (ref 70–99)
Glucose-Capillary: 179 mg/dL — ABNORMAL HIGH (ref 70–99)
Glucose-Capillary: 208 mg/dL — ABNORMAL HIGH (ref 70–99)

## 2022-05-27 MED ORDER — INSULIN ASPART 100 UNIT/ML IJ SOLN
0.0000 [IU] | Freq: Four times a day (QID) | INTRAMUSCULAR | Status: DC
  Administered 2022-05-27: 2 [IU] via SUBCUTANEOUS
  Administered 2022-05-28 – 2022-05-29 (×6): 3 [IU] via SUBCUTANEOUS

## 2022-05-27 MED ORDER — DEXAMETHASONE SODIUM PHOSPHATE 4 MG/ML IJ SOLN
4.0000 mg | Freq: Two times a day (BID) | INTRAMUSCULAR | Status: DC
  Administered 2022-05-27 – 2022-05-28 (×2): 4 mg via INTRAVENOUS
  Filled 2022-05-27 (×2): qty 1

## 2022-05-27 MED ORDER — FUROSEMIDE 10 MG/ML IJ SOLN
40.0000 mg | Freq: Once | INTRAMUSCULAR | Status: AC
  Administered 2022-05-27: 40 mg via INTRAVENOUS
  Filled 2022-05-27: qty 4

## 2022-05-27 MED ORDER — TRAVASOL 10 % IV SOLN
INTRAVENOUS | Status: AC
  Filled 2022-05-27: qty 384

## 2022-05-27 MED ORDER — IOHEXOL 300 MG/ML  SOLN
150.0000 mL | Freq: Once | INTRAMUSCULAR | Status: AC | PRN
  Administered 2022-05-27: 35 mL via ORAL

## 2022-05-27 NOTE — Progress Notes (Signed)
Patient continues to decline nocturnal CPAP.  

## 2022-05-27 NOTE — Progress Notes (Signed)
Patient declines CPAP

## 2022-05-27 NOTE — Plan of Care (Signed)

## 2022-05-27 NOTE — Progress Notes (Signed)
PHYSICAL THERAPY  Pt was able to amb with our Mobility Specialist.  Will continue to monitor throughout Acute Care stay.    Rica Koyanagi  PTA Acute  Rehabilitation Services Office M-F          [phone] Weekend pager [phone]

## 2022-05-27 NOTE — Progress Notes (Signed)
Mobility Specialist - Progress Note   05/27/22 1213  Mobility  HOB Elevated/Bed Position Self regulated  Activity Ambulated with assistance in hallway;Ambulated with assistance to bathroom  Range of Motion/Exercises Active  Level of Assistance Standby assist, set-up cues, supervision of patient - no hands on  Assistive Device Front wheel walker  Distance Ambulated (ft) 140 ft  Activity Response Tolerated well  Transport method Ambulatory  $Mobility charge 1 Mobility   Pt received in bed and agreeable to mobility. Assisted w/ restroom needs. Pt to recliner after session with all needs met.    [facility]

## 2022-05-27 NOTE — Progress Notes (Addendum)
Progress Note  5 Days Post-Op  Subjective: Had emesis with sips over clear yesterday and overnight. Did not have any emesis with contrast for UGI. Abdominal pain stable though sore over incisions  Objective: Vital signs in last 24 hours: Temp:  [97.4 F (36.3 C)-98.2 F (36.8 C)] 97.4 F (36.3 C) (08/30 0611) Pulse Rate:  [69-74] 72 (08/30 0611) Resp:  [18] 18 (08/30 0611) BP: (143-183)/(76-119) 160/99 (08/30 0611) SpO2:  [92 %-94 %] 92 % (08/30 0611) Last BM Date : 05/26/22  Intake/Output from previous day: 08/29 0701 - 08/30 0700 In: 421.4 [P.O.:340; I.V.:81.4] Out: 570 [Urine:550; Drains:20] Intake/Output this shift: No intake/output data recorded.  PE: General: pleasant, WD, female who is laying in bed in NAD HEENT: head is normocephalic, atraumatic.  Lungs:  Respiratory effort nonlabored Abd: soft, ND, +BS, incisions without erythema or induration but with scat serous discharge from left middle and lateral incision. Mild TTP without rebound or guarding MSK: all 4 extremities are symmetrical with no cyanosis, clubbing, or edema. Skin: warm and dry Psych: A&Ox3 with an appropriate affect.    Lab Results:  Recent Labs    05/26/22 0549 05/27/22 0310  WBC 6.3 6.4  HGB 11.1* 11.5*  HCT 38.7 39.1  PLT 151 162   BMET Recent Labs    05/26/22 0549 05/27/22 0310  NA 147* 149*  K 4.6 4.1  CL 114* 112*  CO2 30 30  GLUCOSE 173* 178*  BUN 36* 38*  CREATININE 1.70* 1.50*  CALCIUM 8.2* 8.8*   PT/INR No results for input(s): "LABPROT", "INR" in the last 72 hours. CMP     Component Value Date/Time   NA 149 (H) 05/27/2022 0310   NA 141 12/10/2020 1027   NA 143 04/12/2017 0939   K 4.1 05/27/2022 0310   K 4.2 04/12/2017 0939   CL 112 (H) 05/27/2022 0310   CL 104 02/02/2013 1127   CO2 30 05/27/2022 0310   CO2 30 (H) 04/12/2017 0939   GLUCOSE 178 (H) 05/27/2022 0310   GLUCOSE 70 04/12/2017 0939   GLUCOSE 95 02/02/2013 1127   BUN 38 (H) 05/27/2022 0310    BUN 25 12/10/2020 1027   BUN 20.5 04/12/2017 0939   CREATININE 1.50 (H) 05/27/2022 0310   CREATININE 1.2 (H) 04/12/2017 0939   CALCIUM 8.8 (L) 05/27/2022 0310   CALCIUM 9.8 04/12/2017 0939   PROT 6.4 (L) 05/27/2022 0310   PROT 6.7 12/10/2020 1027   PROT 7.0 04/12/2017 0939   ALBUMIN 2.9 (L) 05/27/2022 0310   ALBUMIN 3.8 12/10/2020 1027   ALBUMIN 3.3 (L) 04/12/2017 0939   AST 52 (H) 05/27/2022 0310   AST 15 04/12/2017 0939   ALT 162 (H) 05/27/2022 0310   ALT 25 04/12/2017 0939   ALKPHOS 40 05/27/2022 0310   ALKPHOS 67 04/12/2017 0939   BILITOT 0.7 05/27/2022 0310   BILITOT 0.2 12/10/2020 1027   BILITOT 0.53 04/12/2017 0939   GFRNONAA 37 (L) 05/27/2022 0310   GFRAA 35 (L) 11/18/2020 1544   Lipase     Component Value Date/Time   LIPASE 44 05/20/2022 1659       Studies/Results: VAS [place] UPPER EXTREMITY VENOUS DUPLEX  Result Date: 05/25/2022 UPPER VENOUS STUDY  Patient Name:  ENYA BUREAU  Date of Exam:   05/25/2022 Medical Rec #: [id number]      Accession #:    [id number] Date of Birth: 01-Aug-1951      Patient Gender: F Patient Age:   71 years Exam Location:  [facility] Procedure:      VAS [place] UPPER EXTREMITY VENOUS DUPLEX Referring Phys: Jerene Pitch MEUTH --------------------------------------------------------------------------------  Indications: Swelling Risk Factors: None identified. Comparison Study: No prior studies. Performing Technologist: Oliver Hum RVT  Examination Guidelines: A complete evaluation includes B-mode imaging, spectral Doppler, color Doppler, and power Doppler as needed of all accessible portions of each vessel. Bilateral testing is considered an integral part of a complete examination. Limited examinations for reoccurring indications may be performed as noted.  Right Findings: +----------+------------+---------+-----------+----------+-------+ RIGHT     CompressiblePhasicitySpontaneousPropertiesSummary  +----------+------------+---------+-----------+----------+-------+ Subclavian    Full       Yes       Yes                      +----------+------------+---------+-----------+----------+-------+  Left Findings: +----------+------------+---------+-----------+----------+-------+ LEFT      CompressiblePhasicitySpontaneousPropertiesSummary +----------+------------+---------+-----------+----------+-------+ IJV           Full       Yes       Yes                      +----------+------------+---------+-----------+----------+-------+ Subclavian    Full       Yes       Yes                      +----------+------------+---------+-----------+----------+-------+ Axillary      Full       Yes       Yes                      +----------+------------+---------+-----------+----------+-------+ Brachial      Full       Yes       Yes                      +----------+------------+---------+-----------+----------+-------+ Radial        Full                                          +----------+------------+---------+-----------+----------+-------+ Ulnar         Full                                          +----------+------------+---------+-----------+----------+-------+ Cephalic      None                                   Acute  +----------+------------+---------+-----------+----------+-------+ Basilic       None                                   Acute  +----------+------------+---------+-----------+----------+-------+ Thrombus located in the cephalic vein is noted to be in the antecubital fossa. Thrombus located in the basilic vein is noted to be in the distal upper arm.  Summary:  Right: No evidence of thrombosis in the subclavian.  Left: No evidence of deep vein thrombosis in the upper extremity. Findings consistent with acute superficial vein thrombosis involving the left basilic vein and left cephalic vein.  *See table(s) above for measurements and observations.   Diagnosing physician: Servando Snare MD Electronically signed  by Servando Snare MD on 05/25/2022 at 4:34:14 PM.    Final    DG Chest Port 1 View  Result Date: 05/25/2022 CLINICAL DATA:  Central line care EXAM: PORTABLE CHEST 1 VIEW COMPARISON:  May 25, 2022 FINDINGS: A left central line is been placed in the interval. The distal tip is in the central SVC. No pneumothorax. Layering effusion with underlying atelectasis on the left. Mild atelectasis in the right base. The cardiomediastinal silhouette is stable. IMPRESSION: Interval insertion of a left central line terminating in the central SVC without pneumothorax. Layering left effusion with underlying atelectasis. Mild right basilar atelectasis. Electronically Signed   By: Dorise Bullion III M.D.   On: 05/25/2022 15:13    Anti-infectives: Anti-infectives (From admission, onward)    Start     Dose/Rate Route Frequency Ordered Stop   05/22/22 0947  sodium chloride 0.9 % with cefTRIAXone (ROCEPHIN) ADS Med       Note to Pharmacy: Kyra Leyland E: cabinet override      05/22/22 0947 05/22/22 2159   05/22/22 0947  metroNIDAZOLE (FLAGYL) 500 MG/100ML IVPB       Note to Pharmacy: Kyra Leyland E: cabinet override      05/22/22 0947 05/22/22 1242   05/21/22 1300  cefTRIAXone (ROCEPHIN) 2 g in sodium chloride 0.9 % 100 mL IVPB  Status:  Discontinued       See Hyperspace for full Linked Orders Report.   2 g 200 mL/hr over 30 Minutes Intravenous On call to O.R. 05/21/22 1250 05/22/22 0559   05/21/22 1300  metroNIDAZOLE (FLAGYL) IVPB 500 mg  Status:  Discontinued       See Hyperspace for full Linked Orders Report.   500 mg 100 mL/hr over 60 Minutes Intravenous On call to O.R. 05/21/22 1250 05/22/22 0559        Assessment/Plan  Incarcerated hiatal hernia with volvulus and obstruction S/p robotic PEH with mesh reinforcement 8/25 Dr. Johney Maine --UGI 8/27 with near-complete obstruction of the distal esophagus, with only a very small amount of  contrast passing into the stomach; no evidence of contrast leak - repeat UGI this am with Persistent significant luminal narrowing at the GE junction with only a thin stream of contrast passing through distal esophagus. Given this and continued emesis with clears do not recommend advancing diet at this time. May need to start TPN - will discuss with MD -continue IV decadron and NPO -continue IV tylenol for pain control -continue PT/mobilize   ID - rocephin/flagyl 8/24>>8/25 FEN - NPO/sips, IVF VTE - lovenox Foley - none   AKI on CKD - Cr improving, 1.5  OSA Obesity HTN DM GERD Asthma H/O breast cancer       LOS: 6 days   Winferd Humphrey, [facility] Surgery 05/27/2022, 9:23 AM Please see Amion for pager number during day hours 7:00am-4:30pm

## 2022-05-27 NOTE — Progress Notes (Signed)
PHARMACY - TOTAL PARENTERAL NUTRITION CONSULT NOTE     Indication:  Intolerance to enteral feeding  Patient Measurements: Height: 5' 2.5" (158.8 cm) Weight: 117.7 kg (259 lb 7.7 oz) IBW/kg (Calculated) : 51.25 TPN AdjBW (KG): 62.5 Body mass index is 46.7 kg/m.  Usual Weight:   Assessment:  Pharmacy is initiate TPN in pt intolerant to tube feeds. Pt with significant luminal narrowing at the GE junction. Pt also with continued emesis with clear. Not advancing diet.   Glucose / Insulin:  - CBGs have been controlled. On sSSI - A1c is 8.2  Electrolytes:  - Na has been trending up. Today it is 149  - Other electrolytes are WNL Renal:  - SCr elevated 1.50 mg/dl, trending down  - BUN elevated at 38  Hepatic: LFTs elevated, improving. Unclear etiology  Intake / Output; MIVF:  - D5W at 50 ml/hr  - I/O likely not charted completely  GI Imaging: UGI 8/27 with near-complete obstruction of the distal esophagus, with only a very small amount of contrast passing into the stomach; no evidence of contrast leak - 8/30 Persistent significant luminal narrowing at the GE junction with only a thin stream of contrast passing through distal esophagus. GI Surgeries / Procedures:  - 8/25 robotic PEH with mesh reinforcement 8/25  Central access: 05/25/2022 TPN start date: 05/27/2022  Nutritional Goals: Goal TPN rate is 70 mL/hr (provides 75 g of protein and 1663 kcals per day)    RD Assessment: Estimated Needs Total Energy Estimated Needs: 1500-1700 Total Protein Estimated Needs: 70-85g Total Fluid Estimated Needs: 1.7l/day  Current Nutrition:  Clear liquids  Plan:  Start TPN at 94m/hr at 1800 Electrolytes in TPN:  Na to 0 mEq/L,  K 534m/L,  Ca 67m62mL,  Mg 67mE106m,  Phos 167mm20m.  Cl:Ac 1:1 Add standard MVI and trace elements to TPN Adjust Sensitive q6h SSI and adjust as needed  Orders for  MIVF expire this PM  Monitor TPN labs on Mon/Thurs    NikolRoyetta AsalrmD,  BCPS 05/27/2022 12:38 PM

## 2022-05-27 NOTE — Progress Notes (Signed)
PROGRESS NOTE  [name] FXJ:[id number] DOB: 1951-08-30 DOA: 05/20/2022 PCP: Shary Key, DO   LOS: 6 days   Brief Narrative / Interim history: 71 year old female with PMH of breast cancer s/p right lumpectomy and radiation, stage IIIb CKD, type II DM, GERD, large hiatal hernia, HLD, HTN, iron deficiency anemia, morbid obesity, OSA and anemia, presented to the ED on 05/20/2022 for evaluation of acute kidney injury.  She was known to have a large hiatal hernia with intermittent nausea and vomiting, symptoms got progressively worse in the week PTA with progressive nausea and vomiting and unable to keep anything down.  Has a outpatient, she had a upper GI series that showed a possible mass so CT abdomen and pelvis was ordered.  Serum creatinine was checked and was over 4.  She was also noted to be hypoxic in the 80s.  Admitted for incarcerated hiatal hernia with volvulus causing total obstruction, abdominal mass/teratoma likely an incidental finding, acute kidney injury.  General surgery was consulted and s/p paraesophageal hernia repair by Dr. Johney Maine on 8/25.    Subjective / 24h Interval events: Doing well, no nausea or vomiting.  She is about to go have upper GI series  Assesement and Plan: Principal Problem:   Incarcerated hiatal hernia Active Problems:   Class 3 severe obesity with serious comorbidity and body mass index (BMI) of 45.0 to 49.9 in adult [facility])   Essential hypertension   History of breast cancer   Diabetes mellitus (HCC)   Allergic asthma, mild intermittent, uncomplicated   Hyperlipidemia associated with type 2 diabetes mellitus (HCC)   GERD (gastroesophageal reflux disease)   Obstructive sleep apnea   Acute kidney injury superimposed on chronic kidney disease ([place])   Abdominal mass   Toupet fundoplicatiopn 6/41/5830   Principal problem Incarcerated hiatal hernia with volvulus causing total obstruction-General surgery consulted and s/p paraesophageal hernia repair  on 8/25 by Dr. Johney Maine.  Postoperative course complicated by near complete obstruction/edema of the distal esophagus for which she was placed on IV steroids, n.p.o.  Repeat UGI 8/30 with persistent luminal narrowing at the GE junction with only a thin stream of contrast passing through the distal esophagus.  Keep n.p.o., steroids.  May need TPN  Active problems Difficult IV access/LUE IV infiltration -Overnight 8/27, left upper extremity IV infiltration with diffuse swelling.  Restricted right upper extremity use due to prior breast cancer surgery.  Given acute on chronic kidney disease, avoiding PICC line.  PCCM placed central line.  LUE venous Doppler without DVT but showed acute SVT involving the left basilic vein and cephalic vein.  Supportive/symptomatic care.  Elevate extremity.  Swelling improving.   Acute kidney injury complicating stage IIIb (not 3a) CKD-Baseline creatinine in the 1.4-1.5 range since 2021.  Presented with creatinine of 3.73.  Likely due to dehydration from poor oral intake, GI losses and home losartan and chlorthalidone use (meds held).  No hydronephrosis on CT. with IV fluids, creatinine has now returned to baseline and is 1.5 this morning   Acute transaminitis-LFTs were unremarkable preop and abruptly increased to AST of 360 and ALT of 319 on 8/26.  Unclear etiology.  Did not notice any preop hypotension to suggest shock liver.?  Related to anesthesia.  Continues to improve on a daily basis.  Hypernatremia-due to dehydration, now starting on TPN  Abdominal mass/possible teratoma -CT A/P 05/20/2022: "There is a complex, well encapsulated, partial wall calcified cystic mass in the right mid abdomen containing the fatty, soft tissue components and  to a lesser extent small areas of calcifications consistent with mesenteric teratoma measuring approximately 12 x 15 x 15.7 cm". Per general surgery, incidental finding and no urgent need for surgery.   Essential hypertension-Holding  home p.o. meds (carvedilol, chlorthalidone and losartan) due to n.p.o. and AKI.  Currently on IV metoprolol and controlled.  Change to oral carvedilol when tolerating p.o. blood pressure elevated at times, continue hydralazine as needed   Diabetes mellitus type II with renal complication's-A1c 8.2 on 8/26.  Tightly controlled CBGs and even had hypoglycemic episode on 8/27, likely due to NPO.     Anemia-Stable.   GERD-Management as above for hiatal hernia.  PPI.   Asthma-Stable without bronchospasm.  As needed albuterol nebs.   OSA on CPAP-Patient declined CPAP.   Breast cancer, s/p lumpectomy, s/p XRT -See below under CT incidental findings.   Incidental findings on CT A/P 8/23 that need further evaluation, as outpatient as deemed necessary -Questionable mild thickening of the ascending and transverse colonic wall.  Severe diverticulosis of sigmoid colon.  Colonoscopy if not already done recently -Enlarged uterus for age containing multiple fibroids with some fibroid showing popcorn calcifications.  Outpatient GYN consultation -Apparent enlargement of pancreatic head measuring 4.9 x 4.1 cm.  CT/MRI of the abdomen per the pancreatic mass protocol. -1.2 cm nodular density in the retroareolar area lateral left breast.  Needs ultrasound/mammogram.  Scheduled Meds:  Chlorhexidine Gluconate Cloth  6 each Topical Once   enoxaparin (LOVENOX) injection  40 mg Subcutaneous Q24H   insulin aspart  0-9 Units Subcutaneous Q6H   metoprolol tartrate  2.5 mg Intravenous Q8H   mouth rinse  15 mL Mouth Rinse 4 times per day   pantoprazole (PROTONIX) IV  40 mg Intravenous Daily   sodium chloride flush  10-40 mL Intracatheter Q12H   Continuous Infusions:  dextrose 50 mL/hr at 05/27/22 0827   ondansetron (ZOFRAN) IV     TPN ADULT (ION)     PRN Meds:.albuterol, hydrALAZINE, metoCLOPramide (REGLAN) injection, morphine injection, ondansetron (ZOFRAN) IV **OR** ondansetron (ZOFRAN) IV, mouth rinse,  polyethylene glycol, prochlorperazine, sodium chloride, sodium chloride flush  Diet Orders (From admission, onward)     Start     Ordered   05/26/22 0820  Diet clear liquid Room service appropriate? Yes; Fluid consistency: Thin; Fluid restriction: 1500 mL Fluid  Diet effective now       Comments: If patient experiences nausea, vomiting, worsening pain, or worsening distention; then, make NPO with sips/ice chips only until seen by MD  Question Answer Comment  Room service appropriate? Yes   Fluid consistency: Thin   Fluid restriction: 1500 mL Fluid   Na restriction, if any: 2 gm Na      05/26/22 0820            DVT prophylaxis: enoxaparin (LOVENOX) injection 40 mg Start: 05/25/22 2200   Lab Results  Component Value Date   PLT 162 05/27/2022      Code Status: Full Code  Family Communication: No family at bedside  Status is: Inpatient Remains inpatient appropriate because: severity of illness  Level of care: Med-Surg  Consultants:  General surgery   Objective: Vitals:   05/27/22 0234 05/27/22 0611 05/27/22 0930 05/27/22 1345  BP: (!) 143/76 (!) 160/99 (!) 179/91 (!) 180/90  Pulse: 69 72 73 67  Resp: '18 18 17 18  '$ Temp:  (!) 97.4 F (36.3 C) 98.6 F (37 C) 98.3 F (36.8 C)  TempSrc:  Oral Oral Oral  SpO2: 94% 92%  96% 92%  Weight:      Height:        Intake/Output Summary (Last 24 hours) at 05/27/2022 1358 Last data filed at 05/27/2022 1345 Gross per 24 hour  Intake 691.38 ml  Output 550 ml  Net 141.38 ml   Wt Readings from Last 3 Encounters:  05/26/22 117.7 kg  06/23/21 113.2 kg  04/21/21 110.8 kg    Examination:  Constitutional: NAD Eyes: no scleral icterus ENMT: Mucous membranes are moist.  Neck: normal, supple Respiratory: clear to auscultation bilaterally, no wheezing, no crackles. Normal respiratory effort. No accessory muscle use.  Cardiovascular: Regular rate and rhythm, no murmurs / rubs / gallops. No LE edema.  Abdomen: non distended, no  tenderness. Bowel sounds positive.  Musculoskeletal: no clubbing / cyanosis.  Skin: no rashes Neurologic: non focal   Data Reviewed: I have independently reviewed following labs and imaging studies   CBC Recent Labs  Lab 05/20/22 1659 05/21/22 0500 05/23/22 0559 05/24/22 0734 05/25/22 0510 05/26/22 0549 05/27/22 0310  WBC 5.8   < > 8.5 7.4 6.0 6.3 6.4  HGB 12.7   < > 12.2 11.7* 11.6* 11.1* 11.5*  HCT 43.6   < > 43.4 41.6 40.6 38.7 39.1  PLT 208   < > 181 164 139* 151 162  MCV 83.0   < > 87.1 86.1 85.8 84.5 82.5  MCH 24.2*   < > 24.5* 24.2* 24.5* 24.2* 24.3*  MCHC 29.1*   < > 28.1* 28.1* 28.6* 28.7* 29.4*  RDW 15.5   < > 15.8* 16.2* 16.4* 16.5* 16.3*  LYMPHSABS 0.8  --   --   --   --   --   --   MONOABS 0.5  --   --   --   --   --   --   EOSABS 0.2  --   --   --   --   --   --   BASOSABS 0.0  --   --   --   --   --   --    < > = values in this interval not displayed.    Recent Labs  Lab 05/23/22 0559 05/23/22 1418 05/24/22 0734 05/25/22 0510 05/26/22 0549 05/27/22 0310  NA 144  --  144 146* 147* 149*  K 4.8  --  4.2 4.7 4.6 4.1  CL 108  --  112* 114* 114* 112*  CO2 26  --  '25 26 30 30  '$ GLUCOSE 128*  --  86 186* 173* 178*  BUN 39*  --  41* 38* 36* 38*  CREATININE 2.32*  --  2.24* 1.89* 1.70* 1.50*  CALCIUM 8.3*  --  7.4* 7.8* 8.2* 8.8*  AST 360*  --  248* 194* 90* 52*  ALT 319*  --  290* 278* 209* 162*  ALKPHOS 50  --  45 44 39 40  BILITOT 0.9  --  0.9 0.9 0.6 0.7  ALBUMIN 3.0*  --  2.9* 2.9* 2.8* 2.9*  HGBA1C  --  8.2*  --   --   --   --     ------------------------------------------------------------------------------------------------------------------ No results for input(s): "CHOL", "HDL", "LDLCALC", "TRIG", "CHOLHDL", "LDLDIRECT" in the last 72 hours.  Lab Results  Component Value Date   HGBA1C 8.2 (H) 05/23/2022   ------------------------------------------------------------------------------------------------------------------ No results for  input(s): "TSH", "T4TOTAL", "T3FREE", "THYROIDAB" in the last 72 hours.  Invalid input(s): "FREET3"  Cardiac Enzymes No results for input(s): "CKMB", "TROPONINI", "MYOGLOBIN" in the last 168  hours.  Invalid input(s): "CK" ------------------------------------------------------------------------------------------------------------------ No results found for: "BNP"  CBG: Recent Labs  Lab 05/26/22 0828 05/26/22 1157 05/26/22 1717 05/27/22 0628 05/27/22 1146  GLUCAP 139* 133* 166* 177* 135*    Recent Results (from the past 240 hour(s))  SARS Coronavirus 2 by RT PCR (hospital order, performed in [facility] hospital lab) *cepheid single result test* Anterior Nasal Swab     Status: None   Collection Time: 05/20/22  5:00 PM   Specimen: Anterior Nasal Swab  Result Value Ref Range Status   SARS Coronavirus 2 by RT PCR NEGATIVE NEGATIVE Final    Comment: (NOTE) SARS-CoV-2 target nucleic acids are NOT DETECTED.  The SARS-CoV-2 RNA is generally detectable in upper and lower respiratory specimens during the acute phase of infection. The lowest concentration of SARS-CoV-2 viral copies this assay can detect is 250 copies / mL. A negative result does not preclude SARS-CoV-2 infection and should not be used as the sole basis for treatment or other patient management decisions.  A negative result may occur with improper specimen collection / handling, submission of specimen other than nasopharyngeal swab, presence of viral mutation(s) within the areas targeted by this assay, and inadequate number of viral copies (<250 copies / mL). A negative result must be combined with clinical observations, patient history, and epidemiological information.  Fact Sheet for Patients:   https://www.patel.info/  Fact Sheet for Healthcare Providers: https://hall.com/  This test is not yet approved or  cleared by the [place] FDA and has been authorized for  detection and/or diagnosis of SARS-CoV-2 by FDA under an Emergency Use Authorization (EUA).  This EUA will remain in effect (meaning this test can be used) for the duration of the COVID-19 declaration under Section 564(b)(1) of the Act, 21 U.S.C. section 360bbb-3(b)(1), unless the authorization is terminated or revoked sooner.  Performed at [facility], [place] [address] [place], [place] [address]      Radiology Studies: DG ESOPHAGUS W SINGLE CM (SOL OR THIN BA)  Result Date: 05/27/2022 CLINICAL DATA:  Patient with history of repair of incarcerated hiatal hernia with volvulus/obstruction, toupet fundoplication on 5/72/62 EXAM: ESOPHAGUS WITH WATER SOLUBLE CONTRAST TECHNIQUE: Single contrast examination was performed using thin liquid barium. This exam was performed by Nash Mantis and was supervised and interpreted by Dr. Suzy Bouchard FLUOROSCOPY: Radiation Exposure Index (as provided by the fluoroscopic device): 21.80 mGy Kerma COMPARISON:  Esophagram on 05/24/22 FINDINGS: Swallowing: Appears normal. No vestibular penetration or aspiration seen. Pharynx: Unremarkable. Esophagus:mildly dilated ; persistent distal narrowing of the esophagus at the GE junction. There anchors over approximately 5 cm segment with only a thin stream of contrast passing through the distal esophagus. Esophageal motility: Delayed emptying at GE junction Hiatal Hernia: None. Gastroesophageal reflux: None visualized. Ingested 72m barium tablet: Not given Other: No definitive evidence of contrast leak IMPRESSION: Persistent significant luminal narrowing at the GE junction with only a thin stream of contrast passing through distal esophagus. Electronically Signed   By: SSuzy BouchardM.D.   On: 05/27/2022 09:40     CMarzetta Board MD, PhD [facility]  Between 7 am - 7 pm I am available, please contact me via Amion (for emergencies) or Securechat (non urgent messages)  Between 7 pm - 7 am  I am not available, please contact night coverage MD/APP via Amion

## 2022-05-28 ENCOUNTER — Other Ambulatory Visit (HOSPITAL_COMMUNITY): Payer: Medicare PPO

## 2022-05-28 ENCOUNTER — Encounter (HOSPITAL_COMMUNITY): Payer: Self-pay

## 2022-05-28 DIAGNOSIS — K440 Diaphragmatic hernia with obstruction, without gangrene: Secondary | ICD-10-CM | POA: Diagnosis not present

## 2022-05-28 LAB — COMPREHENSIVE METABOLIC PANEL
ALT: 118 U/L — ABNORMAL HIGH (ref 0–44)
AST: 31 U/L (ref 15–41)
Albumin: 2.9 g/dL — ABNORMAL LOW (ref 3.5–5.0)
Alkaline Phosphatase: 36 U/L — ABNORMAL LOW (ref 38–126)
Anion gap: 8 (ref 5–15)
BUN: 37 mg/dL — ABNORMAL HIGH (ref 8–23)
CO2: 31 mmol/L (ref 22–32)
Calcium: 8.9 mg/dL (ref 8.9–10.3)
Chloride: 103 mmol/L (ref 98–111)
Creatinine, Ser: 1.51 mg/dL — ABNORMAL HIGH (ref 0.44–1.00)
GFR, Estimated: 36 mL/min — ABNORMAL LOW (ref >60)
Glucose, Bld: 221 mg/dL — ABNORMAL HIGH (ref 70–99)
Potassium: 4.1 mmol/L (ref 3.5–5.1)
Sodium: 142 mmol/L (ref 135–145)
Total Bilirubin: 0.8 mg/dL (ref 0.3–1.2)
Total Protein: 6.2 g/dL — ABNORMAL LOW (ref 6.5–8.1)

## 2022-05-28 LAB — CBC
HCT: 39.5 % (ref 36.0–46.0)
Hemoglobin: 11.9 g/dL — ABNORMAL LOW (ref 12.0–15.0)
MCH: 24.5 pg — ABNORMAL LOW (ref 26.0–34.0)
MCHC: 30.1 g/dL (ref 30.0–36.0)
MCV: 81.4 fL (ref 80.0–100.0)
Platelets: 162 10*3/uL (ref 150–400)
RBC: 4.85 MIL/uL (ref 3.87–5.11)
RDW: 16.3 % — ABNORMAL HIGH (ref 11.5–15.5)
WBC: 5.5 10*3/uL (ref 4.0–10.5)
nRBC: 0.4 % — ABNORMAL HIGH (ref 0.0–0.2)

## 2022-05-28 LAB — TRIGLYCERIDES: Triglycerides: 134 mg/dL (ref <150)

## 2022-05-28 LAB — GLUCOSE, CAPILLARY
Glucose-Capillary: 218 mg/dL — ABNORMAL HIGH (ref 70–99)
Glucose-Capillary: 205 mg/dL — ABNORMAL HIGH (ref 70–99)
Glucose-Capillary: 241 mg/dL — ABNORMAL HIGH (ref 70–99)

## 2022-05-28 LAB — PHOSPHORUS: Phosphorus: <1 mg/dL — CL (ref 2.5–4.6)

## 2022-05-28 LAB — MAGNESIUM: Magnesium: 1.6 mg/dL — ABNORMAL LOW (ref 1.7–2.4)

## 2022-05-28 MED ORDER — MAGNESIUM SULFATE 2 GM/50ML IV SOLN
2.0000 g | Freq: Once | INTRAVENOUS | Status: AC
Start: 2022-05-28 — End: 2022-05-28
  Administered 2022-05-28: 2 g via INTRAVENOUS
  Filled 2022-05-28: qty 50

## 2022-05-28 MED ORDER — SODIUM PHOSPHATES 45 MMOLE/15ML IV SOLN
30.0000 mmol | Freq: Once | INTRAVENOUS | Status: AC
  Administered 2022-05-28: 30 mmol via INTRAVENOUS
  Filled 2022-05-28: qty 10

## 2022-05-28 MED ORDER — DEXAMETHASONE SODIUM PHOSPHATE 4 MG/ML IJ SOLN
4.0000 mg | Freq: Two times a day (BID) | INTRAMUSCULAR | Status: DC
  Administered 2022-05-28 – 2022-06-02 (×10): 4 mg via INTRAVENOUS
  Filled 2022-05-28 (×11): qty 1

## 2022-05-28 MED ORDER — ACETAMINOPHEN 10 MG/ML IV SOLN: 1000.0000 mg | Freq: Four times a day (QID) | INTRAVENOUS | Status: AC | PRN

## 2022-05-28 MED ORDER — POTASSIUM PHOSPHATES 15 MMOLE/5ML IV SOLN
30.0000 mmol | Freq: Once | INTRAVENOUS | Status: AC
  Administered 2022-05-28: 30 mmol via INTRAVENOUS
  Filled 2022-05-28: qty 10

## 2022-05-28 MED ORDER — TRAVASOL 10 % IV SOLN
INTRAVENOUS | Status: AC
  Filled 2022-05-28: qty 384

## 2022-05-28 MED ORDER — FUROSEMIDE 10 MG/ML IJ SOLN
40.0000 mg | Freq: Once | INTRAMUSCULAR | Status: AC
  Administered 2022-05-28: 40 mg via INTRAVENOUS
  Filled 2022-05-28: qty 4

## 2022-05-28 NOTE — Inpatient Diabetes Management (Signed)
Inpatient Diabetes Program Recommendations  AACE/ADA: New Consensus Statement on Inpatient Glycemic Control  Target Ranges:  Prepandial:   less than 140 mg/dL      Peak postprandial:   less than 180 mg/dL (1-2 hours)      Critically ill patients:  140 - 180 mg/dL    Latest Reference Range & Units 05/27/22 06:28 05/27/22 11:46 05/27/22 17:49 05/27/22 23:56 05/28/22 05:12  Glucose-Capillary 70 - 99 mg/dL 177 (H) 135 (H) 179 (H) 208 (H) 218 (H)   Review of Glycemic Control  Diabetes history: DM2 Outpatient Diabetes medications: Jardiance 10 mg daily Current orders for Inpatient glycemic control: Novolog 0-9 units Q6H; Decadron 4 mg Q12H, TPN @ 40 ml/hr  Inpatient Diabetes Program Recommendations:    Insulin: If TPN and Decadron are continued as ordered, please consider adding Regular insulin 10 units to TPN.  Thanks, Barnie Alderman, RN, MSN, [place] Diabetes Coordinator Inpatient Diabetes Program [phone] (Team Pager from 8am to [place])

## 2022-05-28 NOTE — Progress Notes (Addendum)
Nutrition Follow-up  DOCUMENTATION CODES:   Morbid obesity  INTERVENTION:   Monitor magnesium, potassium, and phosphorus BID for at least 3 days, MD to replete as needed, as pt is at risk for refeeding syndrome.  -TPN management per Pharmacy -Recommend 100 mg Thiamine x 5 days d/t refeeding syndrome   -Will monitor for diet advancement  NUTRITION DIAGNOSIS:   Increased nutrient needs related to post-op healing as evidenced by estimated needs.  Ongoing.  GOAL:   Patient will meet greater than or equal to 90% of their needs  Progressing.  MONITOR:   Diet advancement, Labs, Weight trends, I & O's, TPN  REASON FOR ASSESSMENT:   Consult New TPN/TNA  ASSESSMENT:   71 year old with history of hypertension, type 2 diabetes on insulin, CKD stage IIIa with baseline creatinine about 1.6-1.9, hyperlipidemia, history of breast cancer, GERD, untreated sleep apnea presented to the emergency room with abnormal labs.  8/25: s/p paraesophageal hiatal hernia reduction and repair, toupet fundoplication, gastropexy  Patient now on baratric clears. TPN initiated yesterday at 40 ml/hr. Showing signs of refeeding syndrome so will continue at 40 ml/hr tonight per Pharmacy note.   Pt states the last time she was able to swallow food without difficulty was in the ED 8/23. Has been without nutrition for 7 days now.  Admission weight: 219 lbs Current weight: 251 lbs  Medications: IV Mg sulfate, sodium Phos  Labs reviewed:   CBGs: 135-218 Low Phos <1 Low Mg TG 134  NUTRITION - FOCUSED PHYSICAL EXAM:  No depletions noted.  Diet Order:   Diet Order             Diet bariatric clear liquid Room service appropriate? Yes; Fluid consistency: Thin  Diet effective now                   EDUCATION NEEDS:   No education needs have been identified at this time  Skin:  Skin Assessment: Reviewed RN Assessment  Last BM:  8/31 -type 6  Height:   Ht Readings from Last 1  Encounters:  05/22/22 5' 2.5" (1.588 m)    Weight:   Wt Readings from Last 1 Encounters:  05/28/22 114 kg    BMI:  Body mass index is 45.24 kg/m.  Estimated Nutritional Needs:   Kcal:  1500-1700  Protein:  70-85g  Fluid:  1.7l/day  Andrea Bibles, MS, RD, LDN Inpatient Clinical Dietitian Contact information available via Amion

## 2022-05-28 NOTE — Progress Notes (Signed)
Upper GI with wrap tight but contrast going the stomach.  Patient retched again.  Continue IV Decadron twice daily.  Try more aggressive diuresis.  We were able to reach with Dr. Cruzita Lederer who thought a Lasix trial was reasonable since Cr improved.  Hopefully with diuresis and time, swelling will go down faster.  Try bariatric clears and see if she can tolerate that.  If clinically improves advance as tolerated.  If equivocal then restudy in a few days.  Agree with parenteral nutrition so she does not get malnourished.  We will contrasted CT scan with oral and IV contrast when creatinine stabilized and able to tolerate p.o. better.  Help evaluate retroperitoneal mass.  Adin Hector, MD, FACS, MASCRS Esophageal, Gastrointestinal & Colorectal Surgery Robotic and Minimally Invasive Surgery  [facility] [address], [place], [place] [address] [phone] Fax [phone] Main  CONTACT INFORMATION:  Weekday (9AM-5PM): Call [facility] main office at [phone]  Weeknight (5PM-9AM) or Weekend/Holiday: Check [email or web address] (password " [email or web address]") for General Surgery [facility] coverage  (Please, do not use SecureChat as it is not reliable communication to reach operating surgeons for immediate patient care)

## 2022-05-28 NOTE — Progress Notes (Signed)
Mobility Specialist - Progress Note   05/28/22 1000  Mobility  HOB Elevated/Bed Position Self regulated  Activity Ambulated with assistance in hallway  Range of Motion/Exercises Active  Level of Assistance Modified independent, requires aide device or extra time  Assistive Device None  Distance Ambulated (ft) 150 ft  Activity Response Tolerated well  Transport method Ambulatory  $Mobility charge 1 Mobility   Pt received in bed and agreeable to mobility.  Pt to recliner after session with all needs met.     [facility]

## 2022-05-28 NOTE — Progress Notes (Signed)
PROGRESS NOTE  [name] AYT:[id number] DOB: [birth year] DOA: 05/20/2022 PCP: Shary Key, DO   LOS: 7 days   Brief Narrative / Interim history: 71 year old female with PMH of breast cancer s/p right lumpectomy and radiation, stage IIIb CKD, type II DM, GERD, large hiatal hernia, HLD, HTN, iron deficiency anemia, morbid obesity, OSA and anemia, presented to the ED on 05/20/2022 for evaluation of acute kidney injury.  She was known to have a large hiatal hernia with intermittent nausea and vomiting, symptoms got progressively worse in the week PTA with progressive nausea and vomiting and unable to keep anything down.  Has a outpatient, she had a upper GI series that showed a possible mass so CT abdomen and pelvis was ordered.  Serum creatinine was checked and was over 4.  She was also noted to be hypoxic in the 80s.  Admitted for incarcerated hiatal hernia with volvulus causing total obstruction, abdominal mass/teratoma likely an incidental finding, acute kidney injury.  General surgery was consulted and s/p paraesophageal hernia repair by Dr. Johney Maine on 8/25.    Subjective / 24h Interval events: She is feeling well this morning.  Feels like she has been able to hold down liquids a little bit better  Assesement and Plan: Principal Problem:   Incarcerated hiatal hernia Active Problems:   Class 3 severe obesity with serious comorbidity and body mass index (BMI) of 45.0 to 49.9 in adult [facility])   Essential hypertension   History of breast cancer   Diabetes mellitus (HCC)   Allergic asthma, mild intermittent, uncomplicated   Hyperlipidemia associated with type 2 diabetes mellitus (HCC)   GERD (gastroesophageal reflux disease)   Obstructive sleep apnea   Acute kidney injury superimposed on chronic kidney disease ([place])   Abdominal mass   Toupet fundoplicatiopn 3/55/7322   Principal problem Incarcerated hiatal hernia with volvulus causing total obstruction-General surgery consulted and s/p  paraesophageal hernia repair on 8/25 by Dr. Johney Maine.  Postoperative course complicated by near complete obstruction/edema of the distal esophagus for which she was placed on IV steroids, n.p.o.  Repeat UGI 8/30 with persistent luminal narrowing at the GE junction with only a thin stream of contrast passing through the distal esophagus.  Continue clear liquids, steroids.  She has been placed on TPN.  Management per surgery  Active problems Difficult IV access/LUE IV infiltration -Overnight 8/27, left upper extremity IV infiltration with diffuse swelling.  Restricted right upper extremity use due to prior breast cancer surgery.  Given acute on chronic kidney disease, avoiding PICC line.  PCCM placed central line.  LUE venous Doppler without DVT but showed acute SVT involving the left basilic vein and cephalic vein.  Supportive/symptomatic care.  Elevate extremity.  Swelling is improved significantly   Acute kidney injury complicating stage IIIb (not 3a) CKD-Baseline creatinine in the 1.4-1.5 range since 2021.  Presented with creatinine of 3.73.  Likely due to dehydration from poor oral intake, GI losses and home losartan and chlorthalidone use (meds held).  No hydronephrosis on CT. with IV fluids, creatinine is now at baseline   Acute transaminitis-LFTs were unremarkable preop and abruptly increased to AST of 360 and ALT of 319 on 8/26.  Unclear etiology.  Did not notice any preop hypotension to suggest shock liver.?  Related to anesthesia.  Continues to improve on a daily basis.  Hypernatremia-due to dehydration, now starting on TPN.  Sodium normalized this morning  Hypomagnesemia-replete magnesium  Hypophosphatemia-due to refeeding syndrome, replete phosphorus  Abdominal mass/possible teratoma -CT A/P  05/20/2022: "There is a complex, well encapsulated, partial wall calcified cystic mass in the right mid abdomen containing the fatty, soft tissue components and to a lesser extent small areas of  calcifications consistent with mesenteric teratoma measuring approximately 12 x 15 x 15.7 cm". Per general surgery, incidental finding and no urgent need for surgery.   Essential hypertension-Holding home p.o. meds (carvedilol, chlorthalidone and losartan) due to n.p.o. and AKI.  Currently on IV metoprolol and controlled.  Change to oral carvedilol when tolerating p.o. blood pressure elevated at times, continue hydralazine as needed   Diabetes mellitus type II with renal complication's-A1c 8.2 on 8/26.  Tightly controlled CBGs and even had hypoglycemic episode on 8/27, likely due to NPO.     Anemia-Stable.   GERD-Management as above for hiatal hernia.  PPI.   Asthma-Stable without bronchospasm.  As needed albuterol nebs.   OSA on CPAP-Patient declined CPAP.   Breast cancer, s/p lumpectomy, s/p XRT -See below under CT incidental findings.   Incidental findings on CT A/P 8/23 that need further evaluation, as outpatient as deemed necessary -Questionable mild thickening of the ascending and transverse colonic wall.  Severe diverticulosis of sigmoid colon.  Colonoscopy if not already done recently -Enlarged uterus for age containing multiple fibroids with some fibroid showing popcorn calcifications.  Outpatient GYN consultation -Apparent enlargement of pancreatic head measuring 4.9 x 4.1 cm.  CT/MRI of the abdomen per the pancreatic mass protocol. -1.2 cm nodular density in the retroareolar area lateral left breast.  Needs ultrasound/mammogram.  Scheduled Meds:  Chlorhexidine Gluconate Cloth  6 each Topical Once   dexamethasone (DECADRON) injection  4 mg Intravenous Q12H   enoxaparin (LOVENOX) injection  40 mg Subcutaneous Q24H   insulin aspart  0-9 Units Subcutaneous Q6H   metoprolol tartrate  2.5 mg Intravenous Q8H   mouth rinse  15 mL Mouth Rinse 4 times per day   pantoprazole (PROTONIX) IV  40 mg Intravenous Daily   sodium chloride flush  10-40 mL Intracatheter Q12H   Continuous  Infusions:  ondansetron (ZOFRAN) IV     sodium phosphate 30 mmol in dextrose 5 % 250 mL infusion 30 mmol (05/28/22 0930)   TPN ADULT (ION) 40 mL/hr at 05/27/22 1759   TPN ADULT (ION)     PRN Meds:.albuterol, hydrALAZINE, metoCLOPramide (REGLAN) injection, morphine injection, ondansetron (ZOFRAN) IV **OR** ondansetron (ZOFRAN) IV, mouth rinse, polyethylene glycol, prochlorperazine, sodium chloride, sodium chloride flush  Diet Orders (From admission, onward)     Start     Ordered   05/28/22 0943  Diet bariatric clear liquid Room service appropriate? Yes; Fluid consistency: Thin  Diet effective now       Question Answer Comment  Room service appropriate? Yes   Fluid consistency: Thin      05/28/22 0942            DVT prophylaxis: enoxaparin (LOVENOX) injection 40 mg Start: 05/25/22 2200   Lab Results  Component Value Date   PLT 162 05/28/2022      Code Status: Full Code  Family Communication: No family at bedside  Status is: Inpatient Remains inpatient appropriate because: severity of illness  Level of care: Med-Surg  Consultants:  General surgery   Objective: Vitals:   05/27/22 1505 05/27/22 1753 05/27/22 2056 05/28/22 0514  BP: (!) 161/77 (!) 165/94 (!) 157/88 (!) 151/86  Pulse:  73 78 64  Resp:  '17 18 16  '$ Temp:  98.1 F (36.7 C) 98.5 F (36.9 C) 98.5 F (36.9 C)  TempSrc:  Oral Oral Oral  SpO2:  92% 91% 91%  Weight:    114 kg  Height:        Intake/Output Summary (Last 24 hours) at 05/28/2022 1051 Last data filed at 05/28/2022 0931 Gross per 24 hour  Intake 2282.79 ml  Output 3050 ml  Net -767.21 ml    Wt Readings from Last 3 Encounters:  05/28/22 114 kg  06/23/21 113.2 kg  04/21/21 110.8 kg    Examination:  Constitutional: NAD Eyes: lids and conjunctivae normal, no scleral icterus ENMT: mmm Neck: normal, supple Respiratory: clear to auscultation bilaterally, no wheezing, no crackles. Normal respiratory effort.  Cardiovascular: Regular  rate and rhythm, no murmurs / rubs / gallops. No LE edema. Abdomen: soft, no distention, no tenderness. Bowel sounds positive.  Skin: no rashes Neurologic: no focal deficits, equal strength   Data Reviewed: I have independently reviewed following labs and imaging studies   CBC Recent Labs  Lab 05/24/22 0734 05/25/22 0510 05/26/22 0549 05/27/22 0310 05/28/22 0334  WBC 7.4 6.0 6.3 6.4 5.5  HGB 11.7* 11.6* 11.1* 11.5* 11.9*  HCT 41.6 40.6 38.7 39.1 39.5  PLT 164 139* 151 162 162  MCV 86.1 85.8 84.5 82.5 81.4  MCH 24.2* 24.5* 24.2* 24.3* 24.5*  MCHC 28.1* 28.6* 28.7* 29.4* 30.1  RDW 16.2* 16.4* 16.5* 16.3* 16.3*     Recent Labs  Lab 05/23/22 1418 05/24/22 0734 05/25/22 0510 05/26/22 0549 05/27/22 0310 05/28/22 0334  NA  --  144 146* 147* 149* 142  K  --  4.2 4.7 4.6 4.1 4.1  CL  --  112* 114* 114* 112* 103  CO2  --  '25 26 30 30 31  '$ GLUCOSE  --  86 186* 173* 178* 221*  BUN  --  41* 38* 36* 38* 37*  CREATININE  --  2.24* 1.89* 1.70* 1.50* 1.51*  CALCIUM  --  7.4* 7.8* 8.2* 8.8* 8.9  AST  --  248* 194* 90* 52* 31  ALT  --  290* 278* 209* 162* 118*  ALKPHOS  --  45 44 39 40 36*  BILITOT  --  0.9 0.9 0.6 0.7 0.8  ALBUMIN  --  2.9* 2.9* 2.8* 2.9* 2.9*  MG  --   --   --   --   --  1.6*  HGBA1C 8.2*  --   --   --   --   --      ------------------------------------------------------------------------------------------------------------------ Recent Labs    05/28/22 0334  TRIG 134    Lab Results  Component Value Date   HGBA1C 8.2 (H) 05/23/2022   ------------------------------------------------------------------------------------------------------------------ No results for input(s): "TSH", "T4TOTAL", "T3FREE", "THYROIDAB" in the last 72 hours.  Invalid input(s): "FREET3"  Cardiac Enzymes No results for input(s): "CKMB", "TROPONINI", "MYOGLOBIN" in the last 168 hours.  Invalid input(s):  "CK" ------------------------------------------------------------------------------------------------------------------ No results found for: "BNP"  CBG: Recent Labs  Lab 05/27/22 0628 05/27/22 1146 05/27/22 1749 05/27/22 2356 05/28/22 0512  GLUCAP 177* 135* 179* 208* 218*     Recent Results (from the past 240 hour(s))  SARS Coronavirus 2 by RT PCR (hospital order, performed in [facility] hospital lab) *cepheid single result test* Anterior Nasal Swab     Status: None   Collection Time: 05/20/22  5:00 PM   Specimen: Anterior Nasal Swab  Result Value Ref Range Status   SARS Coronavirus 2 by RT PCR NEGATIVE NEGATIVE Final    Comment: (NOTE) SARS-CoV-2 target nucleic acids are NOT DETECTED.  The  SARS-CoV-2 RNA is generally detectable in upper and lower respiratory specimens during the acute phase of infection. The lowest concentration of SARS-CoV-2 viral copies this assay can detect is 250 copies / mL. A negative result does not preclude SARS-CoV-2 infection and should not be used as the sole basis for treatment or other patient management decisions.  A negative result may occur with improper specimen collection / handling, submission of specimen other than nasopharyngeal swab, presence of viral mutation(s) within the areas targeted by this assay, and inadequate number of viral copies (<250 copies / mL). A negative result must be combined with clinical observations, patient history, and epidemiological information.  Fact Sheet for Patients:   https://www.patel.info/  Fact Sheet for Healthcare Providers: https://hall.com/  This test is not yet approved or  cleared by the [place] FDA and has been authorized for detection and/or diagnosis of SARS-CoV-2 by FDA under an Emergency Use Authorization (EUA).  This EUA will remain in effect (meaning this test can be used) for the duration of the COVID-19 declaration under Section  564(b)(1) of the Act, 21 U.S.C. section 360bbb-3(b)(1), unless the authorization is terminated or revoked sooner.  Performed at [facility], [place] [address], [place], [place] [address]      Radiology Studies: No results found.   Marzetta Board, MD, PhD [facility]  Between 7 am - 7 pm I am available, please contact me via Amion (for emergencies) or Securechat (non urgent messages)  Between 7 pm - 7 am I am not available, please contact night coverage MD/APP via Amion

## 2022-05-28 NOTE — Progress Notes (Addendum)
PHARMACY - TOTAL PARENTERAL NUTRITION CONSULT NOTE    Indication:  Intolerance to enteral feeding  Patient Measurements: Height: 5' 2.5" (158.8 cm) Weight: 114 kg (251 lb 5.2 oz) IBW/kg (Calculated) : 51.25 TPN AdjBW (KG): 62.5 Body mass index is 45.24 kg/m.    Assessment:  Pharmacy consulted for TPN in pt intolerant to tube feeds. Pt with significant luminal narrowing at the GE junction. Pt with continued emesis with clear liquid diet.  Glucose / Insulin: A1c 8.2 - CBGs trending up - on SSI q6h - On dexamethasone 4 mg IV BID x 3 days (through 9/1) Electrolytes:  - Na improved - Phos, mag low Renal:  - SCr back to baseline - BUN elevated at 37  Hepatic: LFTs elevated, improving. TG WNL Intake / Output; MIVF: UOP ~ 1 L GI Imaging: UGI 8/27 with near-complete obstruction of the distal esophagus, with only a very small amount of contrast passing into the stomach; no evidence of contrast leak - 8/30 Persistent significant luminal narrowing at the GE junction with only a thin stream of contrast passing through distal esophagus. GI Surgeries / Procedures:  - 8/25 robotic PEH with mesh reinforcement 8/25  Central access: 05/25/2022 TPN start date: 05/27/2022  Nutritional Goals: Goal TPN rate is 70 mL/hr (provides 75 g of protein and 1663 kcals per day)    RD Assessment: Estimated Needs Total Energy Estimated Needs: 1500-1700 Total Protein Estimated Needs: 70-85g Total Fluid Estimated Needs: 1.7l/day  Current Nutrition:  Clear liquids  Plan:  Now - Na phos 30 mmol IV + mag 2 g IV  Continue TPN at 40 mL/hr - do not advance given electrolyte abnormalities needing replacement and risk of refeeding Electrolytes in TPN:  Na 20 mEq/L (add back at minimal concentration) K 50 mEq/L  Ca 5 mEq/L  Mg 5 mEq/L  Phos 20 mmol/L (increased) Cl:Ac 1:1 Add standard MVI and trace elements to TPN Continue Sensitive q6h SSI - will add 10 units regular insulin to TPN today per DM  coordinator recs given ongoing steroids. May be able to remove tomorrow or next day if steroids are no longer continued Monitor TPN labs on Mon/Thurs   Tawnya Crook, PharmD, BCPS Clinical Pharmacist 05/28/2022 9:48 AM

## 2022-05-28 NOTE — Progress Notes (Signed)
Mobility Specialist - Progress Note   Pre-mobility: 71 bpm HR, 95% SpO2 During mobility: 87 bpm HR, 93% SpO2 Post-mobility: 76 bpm HR, 95% SPO2   05/28/22 1524  Mobility  Activity Ambulated with assistance in hallway  Level of Assistance Modified independent, requires aide device or extra time  Assistive Device Front wheel walker  Distance Ambulated (ft) 200 ft  Activity Response Tolerated well  $Mobility charge 1 Mobility   Pt was found in recliner chair and agreeable to mobilize. Had no complaints during ambulation and at EOS returned back to bed with all necessities in reach.  Ferd Hibbs Mobility Specialist

## 2022-05-28 NOTE — Progress Notes (Addendum)
Progress Note  6 Days Post-Op  Subjective: Having bowel movements and sipping on clears. Had a small amount of regurgitation with clears but noticeable improvement. Pain well controlled  Objective: Vital signs in last 24 hours: Temp:  [98.1 F (36.7 C)-98.6 F (37 C)] 98.5 F (36.9 C) (08/31 0514) Pulse Rate:  [64-78] 64 (08/31 0514) Resp:  [16-18] 16 (08/31 0514) BP: (151-180)/(77-94) 151/86 (08/31 0514) SpO2:  [91 %-96 %] 91 % (08/31 0514) Weight:  [195 kg] 114 kg (08/31 0514) Last BM Date : 05/26/22  Intake/Output from previous day: 08/30 0701 - 08/31 0700 In: 2412.8 [P.O.:1260; I.V.:1152.8] Out: 2750 [Urine:2750] Intake/Output this shift: No intake/output data recorded.  PE: General: pleasant, WD, female who is laying in bed in NAD HEENT: head is normocephalic, atraumatic.  Lungs:  Respiratory effort nonlabored Abd: soft, ND, +BS, incisions without erythema, induration or discharge. Mild TTP without rebound or guarding MSK: all 4 extremities are symmetrical with no cyanosis, clubbing, or edema. Skin: warm and dry Psych: A&Ox3 with an appropriate affect.    Lab Results:  Recent Labs    05/27/22 0310 05/28/22 0334  WBC 6.4 5.5  HGB 11.5* 11.9*  HCT 39.1 39.5  PLT 162 162    BMET Recent Labs    05/27/22 0310 05/28/22 0334  NA 149* 142  K 4.1 4.1  CL 112* 103  CO2 30 31  GLUCOSE 178* 221*  BUN 38* 37*  CREATININE 1.50* 1.51*  CALCIUM 8.8* 8.9    PT/INR No results for input(s): "LABPROT", "INR" in the last 72 hours. CMP     Component Value Date/Time   NA 142 05/28/2022 0334   NA 141 12/10/2020 1027   NA 143 04/12/2017 0939   K 4.1 05/28/2022 0334   K 4.2 04/12/2017 0939   CL 103 05/28/2022 0334   CL 104 02/02/2013 1127   CO2 31 05/28/2022 0334   CO2 30 (H) 04/12/2017 0939   GLUCOSE 221 (H) 05/28/2022 0334   GLUCOSE 70 04/12/2017 0939   GLUCOSE 95 02/02/2013 1127   BUN 37 (H) 05/28/2022 0334   BUN 25 12/10/2020 1027   BUN 20.5  04/12/2017 0939   CREATININE 1.51 (H) 05/28/2022 0334   CREATININE 1.2 (H) 04/12/2017 0939   CALCIUM 8.9 05/28/2022 0334   CALCIUM 9.8 04/12/2017 0939   PROT 6.2 (L) 05/28/2022 0334   PROT 6.7 12/10/2020 1027   PROT 7.0 04/12/2017 0939   ALBUMIN 2.9 (L) 05/28/2022 0334   ALBUMIN 3.8 12/10/2020 1027   ALBUMIN 3.3 (L) 04/12/2017 0939   AST 31 05/28/2022 0334   AST 15 04/12/2017 0939   ALT 118 (H) 05/28/2022 0334   ALT 25 04/12/2017 0939   ALKPHOS 36 (L) 05/28/2022 0334   ALKPHOS 67 04/12/2017 0939   BILITOT 0.8 05/28/2022 0334   BILITOT 0.2 12/10/2020 1027   BILITOT 0.53 04/12/2017 0939   GFRNONAA 36 (L) 05/28/2022 0334   GFRAA 35 (L) 11/18/2020 1544   Lipase     Component Value Date/Time   LIPASE 44 05/20/2022 1659       Studies/Results: DG ESOPHAGUS W SINGLE CM (SOL OR THIN BA)  Result Date: 05/27/2022 CLINICAL DATA:  Patient with history of repair of incarcerated hiatal hernia with volvulus/obstruction, toupet fundoplication on 0/93/26 EXAM: ESOPHAGUS WITH WATER SOLUBLE CONTRAST TECHNIQUE: Single contrast examination was performed using thin liquid barium. This exam was performed by Nash Mantis and was supervised and interpreted by Dr. Suzy Bouchard FLUOROSCOPY: Radiation Exposure Index (as provided by  the fluoroscopic device): 21.80 mGy Kerma COMPARISON:  Esophagram on 05/24/22 FINDINGS: Swallowing: Appears normal. No vestibular penetration or aspiration seen. Pharynx: Unremarkable. Esophagus:mildly dilated ; persistent distal narrowing of the esophagus at the GE junction. There anchors over approximately 5 cm segment with only a thin stream of contrast passing through the distal esophagus. Esophageal motility: Delayed emptying at GE junction Hiatal Hernia: None. Gastroesophageal reflux: None visualized. Ingested 25m barium tablet: Not given Other: No definitive evidence of contrast leak IMPRESSION: Persistent significant luminal narrowing at the GE junction with only a  thin stream of contrast passing through distal esophagus. Electronically Signed   By: SSuzy BouchardM.D.   On: 05/27/2022 09:40    Anti-infectives: Anti-infectives (From admission, onward)    Start     Dose/Rate Route Frequency Ordered Stop   05/22/22 0947  sodium chloride 0.9 % with cefTRIAXone (ROCEPHIN) ADS Med       Note to Pharmacy: BKyra LeylandE: cabinet override      05/22/22 0947 05/22/22 2159   05/22/22 0947  metroNIDAZOLE (FLAGYL) 500 MG/100ML IVPB       Note to Pharmacy: BKyra LeylandE: cabinet override      05/22/22 0947 05/22/22 1242   05/21/22 1300  cefTRIAXone (ROCEPHIN) 2 g in sodium chloride 0.9 % 100 mL IVPB  Status:  Discontinued       See Hyperspace for full Linked Orders Report.   2 g 200 mL/hr over 30 Minutes Intravenous On call to O.R. 05/21/22 1250 05/22/22 0559   05/21/22 1300  metroNIDAZOLE (FLAGYL) IVPB 500 mg  Status:  Discontinued       See Hyperspace for full Linked Orders Report.   500 mg 100 mL/hr over 60 Minutes Intravenous On call to O.R. 05/21/22 1250 05/22/22 0559        Assessment/Plan  Incarcerated hiatal hernia with volvulus and obstruction S/p robotic PEH with mesh reinforcement 8/25 Dr. GJohney Maine--UGI 8/27 with near-complete obstruction of the distal esophagus, with only a very small amount of contrast passing into the stomach; no evidence of contrast leak - repeat UGI 8/31 with Persistent significant luminal narrowing at the GE junction with only a thin stream of contrast passing through distal esophagus.  - one dose lasix 40 mg IV given yesterday. Cr stable - tolerating sips of clears and will give bariatric clear liquids today but cautioned patient to move slowly with intake - will discuss with MD when to proceed with repeat UGI -continue IV decadron  -continue IV tylenol for pain control -continue PT/mobilize   ID - rocephin/flagyl 8/24>>8/25 FEN - bariatric CLD, IVF VTE - lovenox Foley - none   AKI on CKD - Cr  improving, 1.5  OSA Obesity HTN DM GERD Asthma H/O breast cancer       LOS: 7 days   MWinferd Humphrey PSamuel Simmonds Memorial HospitalSurgery 05/28/2022, 9:09 AM Please see Amion for pager number during day hours 7:00am-4:30pm

## 2022-05-28 NOTE — Progress Notes (Signed)
Lab called that [name] phophorus level is less than 1, on call notified no new order. We continue to monitor.

## 2022-05-29 DIAGNOSIS — K440 Diaphragmatic hernia with obstruction, without gangrene: Secondary | ICD-10-CM | POA: Diagnosis not present

## 2022-05-29 LAB — CBC
HCT: 38.4 % (ref 36.0–46.0)
Hemoglobin: 11.7 g/dL — ABNORMAL LOW (ref 12.0–15.0)
MCH: 24.3 pg — ABNORMAL LOW (ref 26.0–34.0)
MCHC: 30.5 g/dL (ref 30.0–36.0)
MCV: 79.7 fL — ABNORMAL LOW (ref 80.0–100.0)
Platelets: 152 10*3/uL (ref 150–400)
RBC: 4.82 MIL/uL (ref 3.87–5.11)
RDW: 16.4 % — ABNORMAL HIGH (ref 11.5–15.5)
WBC: 5.4 10*3/uL (ref 4.0–10.5)
nRBC: 0.4 % — ABNORMAL HIGH (ref 0.0–0.2)

## 2022-05-29 LAB — COMPREHENSIVE METABOLIC PANEL
ALT: 88 U/L — ABNORMAL HIGH (ref 0–44)
AST: 23 U/L (ref 15–41)
Albumin: 2.8 g/dL — ABNORMAL LOW (ref 3.5–5.0)
Alkaline Phosphatase: 36 U/L — ABNORMAL LOW (ref 38–126)
Anion gap: 7 (ref 5–15)
BUN: 33 mg/dL — ABNORMAL HIGH (ref 8–23)
CO2: 31 mmol/L (ref 22–32)
Calcium: 8.5 mg/dL — ABNORMAL LOW (ref 8.9–10.3)
Chloride: 96 mmol/L — ABNORMAL LOW (ref 98–111)
Creatinine, Ser: 1.33 mg/dL — ABNORMAL HIGH (ref 0.44–1.00)
GFR, Estimated: 42 mL/min — ABNORMAL LOW (ref >60)
Glucose, Bld: 278 mg/dL — ABNORMAL HIGH (ref 70–99)
Potassium: 4.2 mmol/L (ref 3.5–5.1)
Sodium: 134 mmol/L — ABNORMAL LOW (ref 135–145)
Total Bilirubin: 0.7 mg/dL (ref 0.3–1.2)
Total Protein: 6 g/dL — ABNORMAL LOW (ref 6.5–8.1)

## 2022-05-29 LAB — GLUCOSE, CAPILLARY
Glucose-Capillary: 197 mg/dL — ABNORMAL HIGH (ref 70–99)
Glucose-Capillary: 199 mg/dL — ABNORMAL HIGH (ref 70–99)
Glucose-Capillary: 235 mg/dL — ABNORMAL HIGH (ref 70–99)
Glucose-Capillary: 244 mg/dL — ABNORMAL HIGH (ref 70–99)
Glucose-Capillary: 260 mg/dL — ABNORMAL HIGH (ref 70–99)

## 2022-05-29 LAB — MAGNESIUM: Magnesium: 1.7 mg/dL (ref 1.7–2.4)

## 2022-05-29 LAB — PHOSPHORUS: Phosphorus: 4.9 mg/dL — ABNORMAL HIGH (ref 2.5–4.6)

## 2022-05-29 MED ORDER — INSULIN ASPART 100 UNIT/ML IJ SOLN
0.0000 [IU] | INTRAMUSCULAR | Status: DC
  Administered 2022-05-29: 8 [IU] via SUBCUTANEOUS
  Administered 2022-05-29 – 2022-05-30 (×3): 3 [IU] via SUBCUTANEOUS
  Administered 2022-05-30: 1 [IU] via SUBCUTANEOUS
  Administered 2022-05-30 (×2): 3 [IU] via SUBCUTANEOUS
  Administered 2022-05-30: 5 [IU] via SUBCUTANEOUS
  Administered 2022-05-30: 3 [IU] via SUBCUTANEOUS
  Administered 2022-05-31: 5 [IU] via SUBCUTANEOUS
  Administered 2022-05-31: 3 [IU] via SUBCUTANEOUS
  Administered 2022-05-31 (×2): 5 [IU] via SUBCUTANEOUS
  Administered 2022-05-31: 2 [IU] via SUBCUTANEOUS
  Administered 2022-05-31: 3 [IU] via SUBCUTANEOUS
  Administered 2022-06-01: 4 [IU] via SUBCUTANEOUS
  Administered 2022-06-01: 3 [IU] via SUBCUTANEOUS
  Administered 2022-06-01: 8 [IU] via SUBCUTANEOUS
  Administered 2022-06-01: 5 [IU] via SUBCUTANEOUS
  Administered 2022-06-01 – 2022-06-02 (×3): 3 [IU] via SUBCUTANEOUS
  Administered 2022-06-02: 5 [IU] via SUBCUTANEOUS
  Administered 2022-06-02 (×2): 3 [IU] via SUBCUTANEOUS
  Administered 2022-06-02: 5 [IU] via SUBCUTANEOUS

## 2022-05-29 MED ORDER — MAGNESIUM SULFATE 2 GM/50ML IV SOLN
INTRAVENOUS | Status: AC
  Filled 2022-05-29: qty 50

## 2022-05-29 MED ORDER — MAGNESIUM SULFATE 2 GM/50ML IV SOLN
2.0000 g | Freq: Once | INTRAVENOUS | Status: AC
Start: 2022-05-29 — End: 2022-05-29
  Administered 2022-05-29: 2 g via INTRAVENOUS
  Filled 2022-05-29: qty 50

## 2022-05-29 MED ORDER — CARVEDILOL 25 MG PO TABS
25.0000 mg | ORAL_TABLET | Freq: Two times a day (BID) | ORAL | Status: DC
  Administered 2022-05-29 – 2022-06-02 (×10): 25 mg via ORAL
  Filled 2022-05-29 (×10): qty 1

## 2022-05-29 MED ORDER — TRAVASOL 10 % IV SOLN
INTRAVENOUS | Status: AC
  Filled 2022-05-29: qty 384

## 2022-05-29 NOTE — Care Management Important Message (Signed)
Important Message  Patient Details IM Letter given to the Patient. Name: [name] MRN: [id number] Date of Birth: [birth year]   Medicare Important Message Given:  Yes     Kerin Salen 05/29/2022, 12:50 PM

## 2022-05-29 NOTE — Progress Notes (Signed)
Mobility Specialist - Progress Note   05/29/22 1400  Mobility  Activity Ambulated with assistance in hallway  Level of Assistance Independent after set-up  Assistive Device Front wheel walker  Distance Ambulated (ft) 300 ft  Activity Response Tolerated well  $Mobility charge 1 Mobility   Pt received in chair and agreed to mobilize. No c/o pain nor discomfort. Pt returned to chair with all needs met and call bell within reach.   Roderick Pee Mobility Specialist

## 2022-05-29 NOTE — Progress Notes (Signed)
Physical Therapy Discharge Patient Details Name: ANIYIA RANE MRN: [id number] DOB: [birth year] Today's Date: 05/29/2022 Time:  -     Patient discharged from PT services secondary to goals met and no further PT needs identified.  Please see latest therapy progress note for current level of functioning and progress toward goals.     Pt has been ambulating with mobility specialist and at least supervision level for mobility at this time.  Pt has met supervision level acute care goals.  PT to sign off.  Recommend continuing to mobilize with staff.     Kati L Payson 05/29/2022, 8:40 AM  Arlyce Dice, DPT Physical Therapist Acute Rehabilitation Services Preferred contact method: Secure Chat Weekend Pager Only: [phone] Office: [phone]

## 2022-05-29 NOTE — Progress Notes (Signed)
PHARMACY - TOTAL PARENTERAL NUTRITION CONSULT NOTE    Indication:  Intolerance to enteral feeding  Patient Measurements: Height: 5' 2.5" (158.8 cm) Weight: 114 kg (251 lb 5.2 oz) IBW/kg (Calculated) : 51.25 TPN AdjBW (KG): 62.5 Body mass index is 45.24 kg/m.    Assessment:  Pharmacy consulted for TPN in pt intolerant to tube feeds. Pt with significant luminal narrowing at the GE junction. Pt with continued emesis with clear liquid diet.  Glucose / Insulin: A1c 8.2 - CBGs trending up - on SSI q6h (12 units SSI given last 24hr) + 10 units/L insulin added to TPN bag 8/31 - On dexamethasone 4 mg IV BID x 7 days (through 9/7) Electrolytes:  - Na and Cl now slightly below normal lab range - Phos, now high - note was given 52mol NaPhos and 363ml Kphos on 8/31 - Mag 1.7 and K 4.2 Renal:  - SCr 1.33 improved - BUN elevated at 37  Hepatic: LFTs elevated, improving. TG WNL Intake / Output; MIVF: -3327met I/O, no MIVF GI Imaging: UGI 8/27 with near-complete obstruction of the distal esophagus, with only a very small amount of contrast passing into the stomach; no evidence of contrast leak - 8/30 Persistent significant luminal narrowing at the GE junction with only a thin stream of contrast passing through distal esophagus. GI Surgeries / Procedures:  - 8/25 robotic PEH with mesh reinforcement 8/25  Central access: 05/25/2022 TPN start date: 05/27/2022  Nutritional Goals: Goal TPN rate is 70 mL/hr (provides 75 g of protein and 1663 kcals per day)    RD Assessment: Estimated Needs Total Energy Estimated Needs: 1500-1700 Total Protein Estimated Needs: 70-85g Total Fluid Estimated Needs: 1.7l/day  Current Nutrition:  Clear liquids  Plan:  Now - Mag 2 g IV  Continue TPN at 40 mL/hr - do not advance given electrolyte abnormalities and risk of refeeding Electrolytes in TPN:  Na 50 mEq/L (increased) K 50 mEq/L  Ca 5 mEq/L  Mg 10 mEq/L (increased) Phos 10 mmol/L  (decreased) Cl:Ac 1:1 Add standard MVI and trace elements to TPN Continue SSI but will change to moderate scale and change schedule to q4h With steroids to continue and CBGs continuing to trend up, will increase insulin in TPN from 10 units to 18 units. If CBGs don't improve, will also consider reducing dextrose amount in TPN Monitor TPN labs on Mon/Thurs and prn   LegKara MeadharmD, BCPS Clinical Pharmacist 05/29/2022 8:02 AM

## 2022-05-29 NOTE — Progress Notes (Signed)
PROGRESS NOTE  [name] PRX:[id number] DOB: [birth year] DOA: 05/20/2022 PCP: Shary Key, DO   LOS: 8 days   Brief Narrative / Interim history: 71 year old female with PMH of breast cancer s/p right lumpectomy and radiation, stage IIIb CKD, type II DM, GERD, large hiatal hernia, HLD, HTN, iron deficiency anemia, morbid obesity, OSA and anemia, presented to the ED on 05/20/2022 for evaluation of acute kidney injury.  She was known to have a large hiatal hernia with intermittent nausea and vomiting, symptoms got progressively worse in the week PTA with progressive nausea and vomiting and unable to keep anything down.  Has a outpatient, she had a upper GI series that showed a possible mass so CT abdomen and pelvis was ordered.  Serum creatinine was checked and was over 4.  She was also noted to be hypoxic in the 80s.  Admitted for incarcerated hiatal hernia with volvulus causing total obstruction, abdominal mass/teratoma likely an incidental finding, acute kidney injury.  General surgery was consulted and s/p paraesophageal hernia repair by Dr. Johney Maine on 8/25.    Subjective / 24h Interval events: Doing well.  Some abdominal soreness but no significant pain.  Assesement and Plan: Principal Problem:   Incarcerated hiatal hernia Active Problems:   Class 3 severe obesity with serious comorbidity and body mass index (BMI) of 45.0 to 49.9 in adult [facility])   Essential hypertension   History of breast cancer   Diabetes mellitus (HCC)   Allergic asthma, mild intermittent, uncomplicated   Hyperlipidemia associated with type 2 diabetes mellitus (HCC)   GERD (gastroesophageal reflux disease)   Obstructive sleep apnea   Acute kidney injury superimposed on chronic kidney disease ([place])   Abdominal mass   Toupet fundoplicatiopn 4/62/8638   Principal problem Incarcerated hiatal hernia with volvulus causing total obstruction-General surgery consulted and s/p paraesophageal hernia repair on 8/25 by Dr.  Johney Maine.  Postoperative course complicated by near complete obstruction/edema of the distal esophagus for which she was placed on IV steroids, n.p.o.  Repeat UGI 8/30 with persistent luminal narrowing at the GE junction with only a thin stream of contrast passing through the distal esophagus.  Continue clear liquid, dexamethasone, was given Lasix for this as well per general surgery  Active problems Difficult IV access/LUE IV infiltration -Overnight 8/27, left upper extremity IV infiltration with diffuse swelling.  Restricted right upper extremity use due to prior breast cancer surgery.  Given acute on chronic kidney disease, avoiding PICC line.  PCCM placed central line.  LUE venous Doppler without DVT but showed acute SVT involving the left basilic vein and cephalic vein.  Supportive/symptomatic care.  Elevate extremity.  Swelling continues to improve   Acute kidney injury complicating stage IIIb (not 3a) CKD-Baseline creatinine in the 1.4-1.5 range since 2021.  Presented with creatinine of 3.73.  Likely due to dehydration from poor oral intake, GI losses and home losartan and chlorthalidone use (meds held).  No hydronephrosis on CT. with IV fluids, creatinine has returned to baseline.  She was mildly fluid overloaded and has received Lasix, creatinine continues to remain stable and at baseline   Acute transaminitis-LFTs were unremarkable preop and abruptly increased to AST of 360 and ALT of 319 on 8/26.  Unclear etiology.  Did not notice any preop hypotension to suggest shock liver.?  Related to anesthesia.  This is now improving  Hypernatremia/hyponatremia-due to dehydration, now starting on TPN.  Better, slightly low at 134 today  Hypomagnesemia-replete and continue to monitor  Hypophosphatemia-repleted yesterday, today phosphorus  is on the high side  Abdominal mass/possible teratoma -CT A/P 05/20/2022: "There is a complex, well encapsulated, partial wall calcified cystic mass in the right mid  abdomen containing the fatty, soft tissue components and to a lesser extent small areas of calcifications consistent with mesenteric teratoma measuring approximately 12 x 15 x 15.7 cm". Per general surgery, incidental finding and no urgent need for surgery.   Essential hypertension-hypertensive today.  Resume carvedilol, hopefully she will be able to keep the pills down.  Continue hydralazine PRN   Diabetes mellitus type II with renal complication's-A1c 8.2 on 8/26.  Tightly controlled CBGs and even had hypoglycemic episode on 8/27, likely due to NPO.  Now she is on TPN, continue sliding scale every 4  CBG (last 3)  Recent Labs    05/28/22 1701 05/29/22 0006 05/29/22 0551  GLUCAP 241* 235* 244*     Anemia-Stable.   GERD-Management as above for hiatal hernia.  PPI.   Asthma-Stable without bronchospasm.  As needed albuterol nebs.   OSA on CPAP-Patient declined CPAP.   Breast cancer, s/p lumpectomy, s/p XRT -See below under CT incidental findings.   Incidental findings on CT A/P 8/23 that need further evaluation, as outpatient as deemed necessary -Questionable mild thickening of the ascending and transverse colonic wall.  Severe diverticulosis of sigmoid colon.  Colonoscopy if not already done recently -Enlarged uterus for age containing multiple fibroids with some fibroid showing popcorn calcifications.  Outpatient GYN consultation -Apparent enlargement of pancreatic head measuring 4.9 x 4.1 cm.  CT/MRI of the abdomen per the pancreatic mass protocol. -1.2 cm nodular density in the retroareolar area lateral left breast.  Needs ultrasound/mammogram.  Scheduled Meds:  Chlorhexidine Gluconate Cloth  6 each Topical Once   dexamethasone (DECADRON) injection  4 mg Intravenous Q12H   enoxaparin (LOVENOX) injection  40 mg Subcutaneous Q24H   insulin aspart  0-15 Units Subcutaneous Q4H   metoprolol tartrate  2.5 mg Intravenous Q8H   mouth rinse  15 mL Mouth Rinse 4 times per day    pantoprazole (PROTONIX) IV  40 mg Intravenous Daily   sodium chloride flush  10-40 mL Intracatheter Q12H   Continuous Infusions:  acetaminophen     magnesium sulfate bolus IVPB     ondansetron (ZOFRAN) IV     TPN ADULT (ION) 40 mL/hr at 05/28/22 1706   TPN ADULT (ION)     PRN Meds:.acetaminophen, albuterol, hydrALAZINE, metoCLOPramide (REGLAN) injection, morphine injection, ondansetron (ZOFRAN) IV **OR** ondansetron (ZOFRAN) IV, mouth rinse, polyethylene glycol, prochlorperazine, sodium chloride, sodium chloride flush  Diet Orders (From admission, onward)     Start     Ordered   05/28/22 0943  Diet bariatric clear liquid Room service appropriate? Yes; Fluid consistency: Thin  Diet effective now       Question Answer Comment  Room service appropriate? Yes   Fluid consistency: Thin      05/28/22 0942            DVT prophylaxis: enoxaparin (LOVENOX) injection 40 mg Start: 05/25/22 2200   Lab Results  Component Value Date   PLT 152 05/29/2022      Code Status: Full Code  Family Communication: No family at bedside  Status is: Inpatient Remains inpatient appropriate because: severity of illness  Level of care: Med-Surg  Consultants:  General surgery   Objective: Vitals:   05/28/22 1355 05/28/22 1503 05/28/22 2053 05/29/22 0634  BP: (!) 198/93 (!) 166/75 (!) 188/93 (!) 184/95  Pulse:  72 77 (!)  55  Resp:  '18 18 18  '$ Temp:  98.2 F (36.8 C) 98.3 F (36.8 C) (!) 97.5 F (36.4 C)  TempSrc:  Oral Oral Oral  SpO2:  93% 95% 97%  Weight:      Height:        Intake/Output Summary (Last 24 hours) at 05/29/2022 1051 Last data filed at 05/29/2022 0700 Gross per 24 hour  Intake 1517.71 ml  Output 1950 ml  Net -432.29 ml    Wt Readings from Last 3 Encounters:  05/28/22 114 kg  06/23/21 113.2 kg  04/21/21 110.8 kg    Examination:  Constitutional: NAD Eyes: lids and conjunctivae normal, no scleral icterus ENMT: mmm Neck: normal, supple Respiratory: clear to  auscultation bilaterally, no wheezing, no crackles. Normal respiratory effort.  Cardiovascular: Regular rate and rhythm, no murmurs / rubs / gallops. No LE edema. Abdomen: soft, no distention, no tenderness. Bowel sounds positive.  Skin: no rashes Neurologic: no focal deficits, equal strength   Data Reviewed: I have independently reviewed following labs and imaging studies   CBC Recent Labs  Lab 05/25/22 0510 05/26/22 0549 05/27/22 0310 05/28/22 0334 05/29/22 0325  WBC 6.0 6.3 6.4 5.5 5.4  HGB 11.6* 11.1* 11.5* 11.9* 11.7*  HCT 40.6 38.7 39.1 39.5 38.4  PLT 139* 151 162 162 152  MCV 85.8 84.5 82.5 81.4 79.7*  MCH 24.5* 24.2* 24.3* 24.5* 24.3*  MCHC 28.6* 28.7* 29.4* 30.1 30.5  RDW 16.4* 16.5* 16.3* 16.3* 16.4*     Recent Labs  Lab 05/23/22 1418 05/24/22 0734 05/25/22 0510 05/26/22 0549 05/27/22 0310 05/28/22 0334 05/29/22 0325  NA  --    < > 146* 147* 149* 142 134*  K  --    < > 4.7 4.6 4.1 4.1 4.2  CL  --    < > 114* 114* 112* 103 96*  CO2  --    < > '26 30 30 31 31  '$ GLUCOSE  --    < > 186* 173* 178* 221* 278*  BUN  --    < > 38* 36* 38* 37* 33*  CREATININE  --    < > 1.89* 1.70* 1.50* 1.51* 1.33*  CALCIUM  --    < > 7.8* 8.2* 8.8* 8.9 8.5*  AST  --    < > 194* 90* 52* 31 23  ALT  --    < > 278* 209* 162* 118* 88*  ALKPHOS  --    < > 44 39 40 36* 36*  BILITOT  --    < > 0.9 0.6 0.7 0.8 0.7  ALBUMIN  --    < > 2.9* 2.8* 2.9* 2.9* 2.8*  MG  --   --   --   --   --  1.6* 1.7  HGBA1C 8.2*  --   --   --   --   --   --    < > = values in this interval not displayed.     ------------------------------------------------------------------------------------------------------------------ Recent Labs    05/28/22 0334  TRIG 134     Lab Results  Component Value Date   HGBA1C 8.2 (H) 05/23/2022   ------------------------------------------------------------------------------------------------------------------ No results for input(s): "TSH", "T4TOTAL", "T3FREE",  "THYROIDAB" in the last 72 hours.  Invalid input(s): "FREET3"  Cardiac Enzymes No results for input(s): "CKMB", "TROPONINI", "MYOGLOBIN" in the last 168 hours.  Invalid input(s): "CK" ------------------------------------------------------------------------------------------------------------------ No results found for: "BNP"  CBG: Recent Labs  Lab 05/28/22 0512 05/28/22 1204 05/28/22 1701 05/29/22 0006  05/29/22 0551  GLUCAP 218* 205* 241* 235* 244*     Recent Results (from the past 240 hour(s))  SARS Coronavirus 2 by RT PCR (hospital order, performed in [facility] hospital lab) *cepheid single result test* Anterior Nasal Swab     Status: None   Collection Time: 05/20/22  5:00 PM   Specimen: Anterior Nasal Swab  Result Value Ref Range Status   SARS Coronavirus 2 by RT PCR NEGATIVE NEGATIVE Final    Comment: (NOTE) SARS-CoV-2 target nucleic acids are NOT DETECTED.  The SARS-CoV-2 RNA is generally detectable in upper and lower respiratory specimens during the acute phase of infection. The lowest concentration of SARS-CoV-2 viral copies this assay can detect is 250 copies / mL. A negative result does not preclude SARS-CoV-2 infection and should not be used as the sole basis for treatment or other patient management decisions.  A negative result may occur with improper specimen collection / handling, submission of specimen other than nasopharyngeal swab, presence of viral mutation(s) within the areas targeted by this assay, and inadequate number of viral copies (<250 copies / mL). A negative result must be combined with clinical observations, patient history, and epidemiological information.  Fact Sheet for Patients:   https://www.patel.info/  Fact Sheet for Healthcare Providers: https://hall.com/  This test is not yet approved or  cleared by the [place] FDA and has been authorized for detection and/or diagnosis of  SARS-CoV-2 by FDA under an Emergency Use Authorization (EUA).  This EUA will remain in effect (meaning this test can be used) for the duration of the COVID-19 declaration under Section 564(b)(1) of the Act, 21 U.S.C. section 360bbb-3(b)(1), unless the authorization is terminated or revoked sooner.  Performed at [facility], [place] [address] [place], [place] [address]      Radiology Studies: No results found.   Marzetta Board, MD, PhD [facility]  Between 7 am - 7 pm I am available, please contact me via Amion (for emergencies) or Securechat (non urgent messages)  Between 7 pm - 7 am I am not available, please contact night coverage MD/APP via Amion

## 2022-05-29 NOTE — Progress Notes (Signed)
Progress Note  7 Days Post-Op  Subjective: Continues to feel better with clears - some phlegm but no emesis. Pain well controlled  Objective: Vital signs in last 24 hours: Temp:  [97.5 F (36.4 C)-98.4 F (36.9 C)] 97.5 F (36.4 C) (09/01 0634) Pulse Rate:  [55-77] 55 (09/01 0634) Resp:  [16-18] 18 (09/01 0634) BP: (166-198)/(75-104) 184/95 (09/01 0634) SpO2:  [90 %-97 %] 97 % (09/01 0634) Last BM Date : 05/28/22  Intake/Output from previous day: 08/31 0701 - 09/01 0700 In: 1517.7 [P.O.:530; I.V.:353.6; IV Piggyback:634.2] Out: 2250 [Urine:2250] Intake/Output this shift: No intake/output data recorded.  PE: General: pleasant, WD, female who is laying in bed in NAD HEENT: head is normocephalic, atraumatic.  Lungs:  Respiratory effort nonlabored Abd: soft, ND, +BS, incisions without erythema, induration or discharge. Mild TTP without rebound or guarding MSK: all 4 extremities are symmetrical with no cyanosis, clubbing, or edema. Skin: warm and dry Psych: A&Ox3 with an appropriate affect.    Lab Results:  Recent Labs    05/28/22 0334 05/29/22 0325  WBC 5.5 5.4  HGB 11.9* 11.7*  HCT 39.5 38.4  PLT 162 152    BMET Recent Labs    05/28/22 0334 05/29/22 0325  NA 142 134*  K 4.1 4.2  CL 103 96*  CO2 31 31  GLUCOSE 221* 278*  BUN 37* 33*  CREATININE 1.51* 1.33*  CALCIUM 8.9 8.5*    PT/INR No results for input(s): "LABPROT", "INR" in the last 72 hours. CMP     Component Value Date/Time   NA 134 (L) 05/29/2022 0325   NA 141 12/10/2020 1027   NA 143 04/12/2017 0939   K 4.2 05/29/2022 0325   K 4.2 04/12/2017 0939   CL 96 (L) 05/29/2022 0325   CL 104 02/02/2013 1127   CO2 31 05/29/2022 0325   CO2 30 (H) 04/12/2017 0939   GLUCOSE 278 (H) 05/29/2022 0325   GLUCOSE 70 04/12/2017 0939   GLUCOSE 95 02/02/2013 1127   BUN 33 (H) 05/29/2022 0325   BUN 25 12/10/2020 1027   BUN 20.5 04/12/2017 0939   CREATININE 1.33 (H) 05/29/2022 0325   CREATININE 1.2  (H) 04/12/2017 0939   CALCIUM 8.5 (L) 05/29/2022 0325   CALCIUM 9.8 04/12/2017 0939   PROT 6.0 (L) 05/29/2022 0325   PROT 6.7 12/10/2020 1027   PROT 7.0 04/12/2017 0939   ALBUMIN 2.8 (L) 05/29/2022 0325   ALBUMIN 3.8 12/10/2020 1027   ALBUMIN 3.3 (L) 04/12/2017 0939   AST 23 05/29/2022 0325   AST 15 04/12/2017 0939   ALT 88 (H) 05/29/2022 0325   ALT 25 04/12/2017 0939   ALKPHOS 36 (L) 05/29/2022 0325   ALKPHOS 67 04/12/2017 0939   BILITOT 0.7 05/29/2022 0325   BILITOT 0.2 12/10/2020 1027   BILITOT 0.53 04/12/2017 0939   GFRNONAA 42 (L) 05/29/2022 0325   GFRAA 35 (L) 11/18/2020 1544   Lipase     Component Value Date/Time   LIPASE 44 05/20/2022 1659       Studies/Results: No results found.  Anti-infectives: Anti-infectives (From admission, onward)    Start     Dose/Rate Route Frequency Ordered Stop   05/22/22 0947  sodium chloride 0.9 % with cefTRIAXone (ROCEPHIN) ADS Med       Note to Pharmacy: Kyra Leyland E: cabinet override      05/22/22 0947 05/22/22 2159   05/22/22 0947  metroNIDAZOLE (FLAGYL) 500 MG/100ML IVPB       Note to Pharmacy: Tempie Donning,  Ria Comment E: cabinet override      05/22/22 0947 05/22/22 1242   05/21/22 1300  cefTRIAXone (ROCEPHIN) 2 g in sodium chloride 0.9 % 100 mL IVPB  Status:  Discontinued       See Hyperspace for full Linked Orders Report.   2 g 200 mL/hr over 30 Minutes Intravenous On call to O.R. 05/21/22 1250 05/22/22 0559   05/21/22 1300  metroNIDAZOLE (FLAGYL) IVPB 500 mg  Status:  Discontinued       See Hyperspace for full Linked Orders Report.   500 mg 100 mL/hr over 60 Minutes Intravenous On call to O.R. 05/21/22 1250 05/22/22 0559        Assessment/Plan  Incarcerated hiatal hernia with volvulus and obstruction S/p robotic PEH with mesh reinforcement 8/25 Dr. Johney Maine --UGI 8/27 with near-complete obstruction of the distal esophagus, with only a very small amount of contrast passing into the stomach; no evidence of contrast  leak - repeat UGI 8/31 with Persistent significant luminal narrowing at the GE junction with only a thin stream of contrast passing through distal esophagus.  - one dose lasix 40 mg IV given 8/30. Cr stable - tolerating slow intake clear liquids  - plan for repeat UGI Sun/Mon (has been ordered). CLD until then -continue IV decadron  -continue IV tylenol for pain control -continue PT/mobilize   ID - rocephin/flagyl 8/24>>8/25 FEN - bariatric CLD, IVF VTE - lovenox Foley - none   AKI on CKD - Cr improving, 1.5  OSA Obesity HTN DM GERD Asthma H/O breast cancer       LOS: 8 days   Winferd Humphrey, [facility] 05/29/2022, 11:00 AM Please see Amion for pager number during day hours 7:00am-4:30pm

## 2022-05-30 DIAGNOSIS — K440 Diaphragmatic hernia with obstruction, without gangrene: Secondary | ICD-10-CM | POA: Diagnosis not present

## 2022-05-30 LAB — CBC
HCT: 37.9 % (ref 36.0–46.0)
Hemoglobin: 11.7 g/dL — ABNORMAL LOW (ref 12.0–15.0)
MCH: 24.5 pg — ABNORMAL LOW (ref 26.0–34.0)
MCHC: 30.9 g/dL (ref 30.0–36.0)
MCV: 79.3 fL — ABNORMAL LOW (ref 80.0–100.0)
Platelets: 157 10*3/uL (ref 150–400)
RBC: 4.78 MIL/uL (ref 3.87–5.11)
RDW: 16.5 % — ABNORMAL HIGH (ref 11.5–15.5)
WBC: 6.2 10*3/uL (ref 4.0–10.5)
nRBC: 0 % (ref 0.0–0.2)

## 2022-05-30 LAB — GLUCOSE, CAPILLARY
Glucose-Capillary: 165 mg/dL — ABNORMAL HIGH (ref 70–99)
Glucose-Capillary: 170 mg/dL — ABNORMAL HIGH (ref 70–99)
Glucose-Capillary: 192 mg/dL — ABNORMAL HIGH (ref 70–99)
Glucose-Capillary: 194 mg/dL — ABNORMAL HIGH (ref 70–99)
Glucose-Capillary: 199 mg/dL — ABNORMAL HIGH (ref 70–99)
Glucose-Capillary: 203 mg/dL — ABNORMAL HIGH (ref 70–99)
Glucose-Capillary: 211 mg/dL — ABNORMAL HIGH (ref 70–99)

## 2022-05-30 LAB — COMPREHENSIVE METABOLIC PANEL
ALT: 64 U/L — ABNORMAL HIGH (ref 0–44)
AST: 18 U/L (ref 15–41)
Albumin: 2.7 g/dL — ABNORMAL LOW (ref 3.5–5.0)
Alkaline Phosphatase: 34 U/L — ABNORMAL LOW (ref 38–126)
Anion gap: 8 (ref 5–15)
BUN: 33 mg/dL — ABNORMAL HIGH (ref 8–23)
CO2: 29 mmol/L (ref 22–32)
Calcium: 8.4 mg/dL — ABNORMAL LOW (ref 8.9–10.3)
Chloride: 96 mmol/L — ABNORMAL LOW (ref 98–111)
Creatinine, Ser: 1.15 mg/dL — ABNORMAL HIGH (ref 0.44–1.00)
GFR, Estimated: 50 mL/min — ABNORMAL LOW (ref >60)
Glucose, Bld: 197 mg/dL — ABNORMAL HIGH (ref 70–99)
Potassium: 4.7 mmol/L (ref 3.5–5.1)
Sodium: 133 mmol/L — ABNORMAL LOW (ref 135–145)
Total Bilirubin: 0.7 mg/dL (ref 0.3–1.2)
Total Protein: 5.8 g/dL — ABNORMAL LOW (ref 6.5–8.1)

## 2022-05-30 LAB — PHOSPHORUS: Phosphorus: 3.6 mg/dL (ref 2.5–4.6)

## 2022-05-30 LAB — MAGNESIUM: Magnesium: 2 mg/dL (ref 1.7–2.4)

## 2022-05-30 MED ORDER — SODIUM CHLORIDE (PF) 0.9 % IJ SOLN
INTRAMUSCULAR | Status: AC
  Administered 2022-05-30: 10 mL
  Filled 2022-05-30: qty 10

## 2022-05-30 MED ORDER — LOSARTAN POTASSIUM 50 MG PO TABS
100.0000 mg | ORAL_TABLET | Freq: Every day | ORAL | Status: DC
  Administered 2022-05-30 – 2022-06-02 (×4): 100 mg via ORAL
  Filled 2022-05-30 (×5): qty 2

## 2022-05-30 MED ORDER — ACETAMINOPHEN 325 MG PO TABS
650.0000 mg | ORAL_TABLET | Freq: Four times a day (QID) | ORAL | Status: DC | PRN
Start: 2022-05-30 — End: 2022-05-30

## 2022-05-30 MED ORDER — ACETAMINOPHEN 160 MG/5ML PO SOLN
500.0000 mg | Freq: Four times a day (QID) | ORAL | Status: DC | PRN
  Administered 2022-05-31: 500 mg via ORAL
  Filled 2022-05-30: qty 20.3

## 2022-05-30 MED ORDER — TRAVASOL 10 % IV SOLN
INTRAVENOUS | Status: AC
  Filled 2022-05-30: qty 528

## 2022-05-30 NOTE — Progress Notes (Signed)
Mobility Specialist - Progress Note   05/30/22 1546  Mobility  HOB Elevated/Bed Position Self regulated  Activity Ambulated independently in hallway  Range of Motion/Exercises Active  Level of Assistance Modified independent, requires aide device or extra time  Assistive Device  (IV Pole)  Distance Ambulated (ft) 150 ft  Activity Response Tolerated well  Transport method Ambulatory  $Mobility charge 1 Mobility   Pt received in bed and agreeable to mobility. Pt to bed after session with all needs met.    [facility]

## 2022-05-30 NOTE — Progress Notes (Signed)
8 Days Post-Op   Subjective/Chief Complaint: Feeling better. Tolerating liquids   Objective: Vital signs in last 24 hours: Temp:  [97.7 F (36.5 C)-98.4 F (36.9 C)] 97.7 F (36.5 C) (09/02 0816) Pulse Rate:  [55-61] 58 (09/02 1001) Resp:  [18-20] 20 (09/02 0816) BP: (155-188)/(75-105) 159/105 (09/02 1001) SpO2:  [96 %-97 %] 96 % (09/02 0816) Weight:  [112.5 kg] 112.5 kg (09/02 0419) Last BM Date : 05/30/22  Intake/Output from previous day: 09/01 0701 - 09/02 0700 In: 1056.1 [P.O.:600; I.V.:456.1] Out: 690 [Urine:690] Intake/Output this shift: No intake/output data recorded.  General appearance: alert and cooperative Resp: clear to auscultation bilaterally Cardio: regular rate and rhythm GI: soft, minimal tenderness  Lab Results:  Recent Labs    05/29/22 0325 05/30/22 0304  WBC 5.4 6.2  HGB 11.7* 11.7*  HCT 38.4 37.9  PLT 152 157   BMET Recent Labs    05/29/22 0325 05/30/22 0304  NA 134* 133*  K 4.2 4.7  CL 96* 96*  CO2 31 29  GLUCOSE 278* 197*  BUN 33* 33*  CREATININE 1.33* 1.15*  CALCIUM 8.5* 8.4*   PT/INR No results for input(s): "LABPROT", "INR" in the last 72 hours. ABG No results for input(s): "PHART", "HCO3" in the last 72 hours.  Invalid input(s): "PCO2", "PO2"  Studies/Results: No results found.  Anti-infectives: Anti-infectives (From admission, onward)    Start     Dose/Rate Route Frequency Ordered Stop   05/22/22 0947  sodium chloride 0.9 % with cefTRIAXone (ROCEPHIN) ADS Med       Note to Pharmacy: Kyra Leyland E: cabinet override      05/22/22 0947 05/22/22 2159   05/22/22 0947  metroNIDAZOLE (FLAGYL) 500 MG/100ML IVPB       Note to Pharmacy: Kyra Leyland E: cabinet override      05/22/22 0947 05/22/22 1242   05/21/22 1300  cefTRIAXone (ROCEPHIN) 2 g in sodium chloride 0.9 % 100 mL IVPB  Status:  Discontinued       See Hyperspace for full Linked Orders Report.   2 g 200 mL/hr over 30 Minutes Intravenous On call to  O.R. 05/21/22 1250 05/22/22 0559   05/21/22 1300  metroNIDAZOLE (FLAGYL) IVPB 500 mg  Status:  Discontinued       See Hyperspace for full Linked Orders Report.   500 mg 100 mL/hr over 60 Minutes Intravenous On call to O.R. 05/21/22 1250 05/22/22 0559       Assessment/Plan: s/p Procedure(s): XI ROBOTIC ASSISTED HIATAL HERNIA REPAIR WITH FUNDOPLICATION AND MESH REINFORCEMENT (N/A) Continue clears Plan for UGI again on Sunday to see if she is opening up  Incarcerated hiatal hernia with volvulus and obstruction S/p robotic PEH with mesh reinforcement 8/25 Dr. Johney Maine --UGI 8/27 with near-complete obstruction of the distal esophagus, with only a very small amount of contrast passing into the stomach; no evidence of contrast leak - repeat UGI 8/31 with Persistent significant luminal narrowing at the GE junction with only a thin stream of contrast passing through distal esophagus.  - one dose lasix 40 mg IV given 8/30. Cr stable - tolerating slow intake clear liquids  - plan for repeat UGI Sun/Mon (has been ordered). CLD until then -continue IV decadron  -continue IV tylenol for pain control -continue PT/mobilize   ID - rocephin/flagyl 8/24>>8/25 FEN - bariatric CLD, IVF VTE - lovenox Foley - none   AKI on CKD - Cr improving, 1.5  OSA Obesity HTN DM GERD Asthma H/O breast cancer  LOS: 9  days    Autumn Messing III 05/30/2022

## 2022-05-30 NOTE — Progress Notes (Signed)
PROGRESS NOTE  [name] ZOX:[id number] DOB: 1951/08/31 DOA: 05/20/2022 PCP: Shary Key, DO   LOS: 9 days   Brief Narrative / Interim history: 71 year old female with PMH of breast cancer s/p right lumpectomy and radiation, stage IIIb CKD, type II DM, GERD, large hiatal hernia, HLD, HTN, iron deficiency anemia, morbid obesity, OSA and anemia, presented to the ED on 05/20/2022 for evaluation of acute kidney injury.  She was known to have a large hiatal hernia with intermittent nausea and vomiting, symptoms got progressively worse in the week PTA with progressive nausea and vomiting and unable to keep anything down.  Has a outpatient, she had a upper GI series that showed a possible mass so CT abdomen and pelvis was ordered.  Serum creatinine was checked and was over 4.  She was also noted to be hypoxic in the 80s.  Admitted for incarcerated hiatal hernia with volvulus causing total obstruction, abdominal mass/teratoma likely an incidental finding, acute kidney injury.  General surgery was consulted and s/p paraesophageal hernia repair by Dr. Johney Maine on 8/25.    Subjective / 24h Interval events: No chest pain, no shortness of breath.  Mild abdominal soreness but no significant pain.  Assesement and Plan: Principal Problem:   Incarcerated hiatal hernia Active Problems:   Class 3 severe obesity with serious comorbidity and body mass index (BMI) of 45.0 to 49.9 in adult [facility])   Essential hypertension   History of breast cancer   Diabetes mellitus (HCC)   Allergic asthma, mild intermittent, uncomplicated   Hyperlipidemia associated with type 2 diabetes mellitus (HCC)   GERD (gastroesophageal reflux disease)   Obstructive sleep apnea   Acute kidney injury superimposed on chronic kidney disease ([place])   Abdominal mass   [place] fundoplicatiopn 05/08/9146   Principal problem Incarcerated hiatal hernia with volvulus causing total obstruction-General surgery consulted and s/p paraesophageal  hernia repair on 8/25 by Dr. Johney Maine.  Postoperative course complicated by near complete obstruction/edema of the distal esophagus for which she was placed on IV steroids, n.p.o.  Repeat UGI 8/30 with persistent luminal narrowing at the GE junction with only a thin stream of contrast passing through the distal esophagus.  Continue clear liquids, steroids, repeat UGI tomorrow  Active problems Difficult IV access/LUE IV infiltration -Overnight 8/27, left upper extremity IV infiltration with diffuse swelling.  Restricted right upper extremity use due to prior breast cancer surgery.  Given acute on chronic kidney disease, avoiding PICC line.  PCCM placed central line.  LUE venous Doppler without DVT but showed acute SVT involving the left basilic vein and cephalic vein.  Supportive/symptomatic care.  Elevate extremity.  Almost resolved   Acute kidney injury complicating stage IIIb (not 3a) CKD-Baseline creatinine in the 1.4-1.5 range since 2021.  Presented with creatinine of 3.73.  Likely due to dehydration from poor oral intake, GI losses and home losartan and chlorthalidone use (meds held).  No hydronephrosis on CT. with IV fluids, creatinine has returned to baseline.  She was mildly fluid overloaded and has received Lasix, creatinine back to baseline today   Acute transaminitis-LFTs were unremarkable preop and abruptly increased to AST of 360 and ALT of 319 on 8/26.  Unclear etiology.  Did not notice any preop hypotension to suggest shock liver.?  Related to anesthesia.  This is now improving  Hypernatremia/hyponatremia-due to dehydration, now starting on TPN.  Over the stable, sodium 133 today  Hypomagnesemia-replete and continue to monitor  Hypophosphatemia-repleted yesterday, today phosphorus is on the high side  Abdominal  mass/possible teratoma -CT A/P 05/20/2022: "There is a complex, well encapsulated, partial wall calcified cystic mass in the right mid abdomen containing the fatty, soft tissue  components and to a lesser extent small areas of calcifications consistent with mesenteric teratoma measuring approximately 12 x 15 x 15.7 cm". Per general surgery, incidental finding and no urgent need for surgery.   Essential hypertension-regular resumed yesterday, doing well with it.  Blood pressure little bit better but still suboptimal.  Add back her home Cozaar now that her kidney function has returned to her baseline   Diabetes mellitus type II with renal complication's-A1c 8.2 on 8/26.  Tightly controlled CBGs and even had hypoglycemic episode on 8/27, likely due to NPO.  Now she is on TPN, continue sliding scale every 4  CBG (last 3)  Recent Labs    05/30/22 0004 05/30/22 0407 05/30/22 0817  GLUCAP 170* 199* 203*      Anemia-Stable.   GERD-Management as above for hiatal hernia.  PPI.   Asthma-Stable without bronchospasm.  As needed albuterol nebs.   OSA on CPAP-Patient declined CPAP.   Breast cancer, s/p lumpectomy, s/p XRT -See below under CT incidental findings.   Incidental findings on CT A/P 8/23 that need further evaluation, as outpatient as deemed necessary -Questionable mild thickening of the ascending and transverse colonic wall.  Severe diverticulosis of sigmoid colon.  Colonoscopy if not already done recently -Enlarged uterus for age containing multiple fibroids with some fibroid showing popcorn calcifications.  Outpatient GYN consultation -Apparent enlargement of pancreatic head measuring 4.9 x 4.1 cm.  CT/MRI of the abdomen per the pancreatic mass protocol. -1.2 cm nodular density in the retroareolar area lateral left breast.  Needs ultrasound/mammogram.  Scheduled Meds:  carvedilol  25 mg Oral BID WC   Chlorhexidine Gluconate Cloth  6 each Topical Once   dexamethasone (DECADRON) injection  4 mg Intravenous Q12H   enoxaparin (LOVENOX) injection  40 mg Subcutaneous Q24H   insulin aspart  0-15 Units Subcutaneous Q4H   losartan  100 mg Oral Daily   mouth rinse   15 mL Mouth Rinse 4 times per day   pantoprazole (PROTONIX) IV  40 mg Intravenous Daily   sodium chloride flush  10-40 mL Intracatheter Q12H   Continuous Infusions:  ondansetron (ZOFRAN) IV     TPN ADULT (ION) 40 mL/hr at 05/29/22 1731   TPN ADULT (ION)     PRN Meds:.acetaminophen (TYLENOL) oral liquid 160 mg/5 mL, albuterol, hydrALAZINE, metoCLOPramide (REGLAN) injection, morphine injection, ondansetron (ZOFRAN) IV **OR** ondansetron (ZOFRAN) IV, mouth rinse, polyethylene glycol, prochlorperazine, sodium chloride, sodium chloride flush  Diet Orders (From admission, onward)     Start     Ordered   05/28/22 0943  Diet bariatric clear liquid Room service appropriate? Yes; Fluid consistency: Thin  Diet effective now       Question Answer Comment  Room service appropriate? Yes   Fluid consistency: Thin      05/28/22 0942            DVT prophylaxis: enoxaparin (LOVENOX) injection 40 mg Start: 05/25/22 2200   Lab Results  Component Value Date   PLT 157 05/30/2022      Code Status: Full Code  Family Communication: No family at bedside  Status is: Inpatient Remains inpatient appropriate because: severity of illness  Level of care: Med-Surg  Consultants:  General surgery   Objective: Vitals:   05/30/22 0417 05/30/22 0419 05/30/22 0816 05/30/22 1001  BP: (!) 155/75  (!) 158/90 [name])  159/105  Pulse: (!) 55  61 (!) 58  Resp: 18  20   Temp: 97.8 F (36.6 C)  97.7 F (36.5 C)   TempSrc: Oral  Oral   SpO2: 97%  96%   Weight:  112.5 kg    Height:        Intake/Output Summary (Last 24 hours) at 05/30/2022 1034 Last data filed at 05/30/2022 0600 Gross per 24 hour  Intake 1056.07 ml  Output 600 ml  Net 456.07 ml    Wt Readings from Last 3 Encounters:  05/30/22 112.5 kg  06/23/21 113.2 kg  04/21/21 110.8 kg    Examination:  Constitutional: NAD Eyes: lids and conjunctivae normal, no scleral icterus ENMT: mmm Neck: normal, supple Respiratory: clear to  auscultation bilaterally, no wheezing, no crackles. Normal respiratory effort.  Cardiovascular: Regular rate and rhythm, no murmurs / rubs / gallops. No LE edema. Abdomen: soft, no distention, no tenderness. Bowel sounds positive.  Skin: no rashes Neurologic: no focal deficits, equal strength   Data Reviewed: I have independently reviewed following labs and imaging studies   CBC Recent Labs  Lab 05/26/22 0549 05/27/22 0310 05/28/22 0334 05/29/22 0325 05/30/22 0304  WBC 6.3 6.4 5.5 5.4 6.2  HGB 11.1* 11.5* 11.9* 11.7* 11.7*  HCT 38.7 39.1 39.5 38.4 37.9  PLT 151 162 162 152 157  MCV 84.5 82.5 81.4 79.7* 79.3*  MCH 24.2* 24.3* 24.5* 24.3* 24.5*  MCHC 28.7* 29.4* 30.1 30.5 30.9  RDW 16.5* 16.3* 16.3* 16.4* 16.5*     Recent Labs  Lab 05/23/22 1418 05/24/22 0734 05/26/22 0549 05/27/22 0310 05/28/22 0334 05/29/22 0325 05/30/22 0304  NA  --    < > 147* 149* 142 134* 133*  K  --    < > 4.6 4.1 4.1 4.2 4.7  CL  --    < > 114* 112* 103 96* 96*  CO2  --    < > '30 30 31 31 29  '$ GLUCOSE  --    < > 173* 178* 221* 278* 197*  BUN  --    < > 36* 38* 37* 33* 33*  CREATININE  --    < > 1.70* 1.50* 1.51* 1.33* 1.15*  CALCIUM  --    < > 8.2* 8.8* 8.9 8.5* 8.4*  AST  --    < > 90* 52* '31 23 18  '$ ALT  --    < > 209* 162* 118* 88* 64*  ALKPHOS  --    < > 39 40 36* 36* 34*  BILITOT  --    < > 0.6 0.7 0.8 0.7 0.7  ALBUMIN  --    < > 2.8* 2.9* 2.9* 2.8* 2.7*  MG  --   --   --   --  1.6* 1.7 2.0  HGBA1C 8.2*  --   --   --   --   --   --    < > = values in this interval not displayed.     ------------------------------------------------------------------------------------------------------------------ Recent Labs    05/28/22 0334  TRIG 134     Lab Results  Component Value Date   HGBA1C 8.2 (H) 05/23/2022   ------------------------------------------------------------------------------------------------------------------ No results for input(s): "TSH", "T4TOTAL", "T3FREE",  "THYROIDAB" in the last 72 hours.  Invalid input(s): "FREET3"  Cardiac Enzymes No results for input(s): "CKMB", "TROPONINI", "MYOGLOBIN" in the last 168 hours.  Invalid input(s): "CK" ------------------------------------------------------------------------------------------------------------------ No results found for: "BNP"  CBG: Recent Labs  Lab 05/29/22 1635 05/29/22 1951 05/30/22  0004 05/30/22 0407 05/30/22 0817  GLUCAP 260* 197* 170* 199* 203*     Recent Results (from the past 240 hour(s))  SARS Coronavirus 2 by RT PCR (hospital order, performed in [facility] hospital lab) *cepheid single result test* Anterior Nasal Swab     Status: None   Collection Time: 05/20/22  5:00 PM   Specimen: Anterior Nasal Swab  Result Value Ref Range Status   SARS Coronavirus 2 by RT PCR NEGATIVE NEGATIVE Final    Comment: (NOTE) SARS-CoV-2 target nucleic acids are NOT DETECTED.  The SARS-CoV-2 RNA is generally detectable in upper and lower respiratory specimens during the acute phase of infection. The lowest concentration of SARS-CoV-2 viral copies this assay can detect is 250 copies / mL. A negative result does not preclude SARS-CoV-2 infection and should not be used as the sole basis for treatment or other patient management decisions.  A negative result may occur with improper specimen collection / handling, submission of specimen other than nasopharyngeal swab, presence of viral mutation(s) within the areas targeted by this assay, and inadequate number of viral copies (<250 copies / mL). A negative result must be combined with clinical observations, patient history, and epidemiological information.  Fact Sheet for Patients:   https://www.patel.info/  Fact Sheet for Healthcare Providers: https://hall.com/  This test is not yet approved or  cleared by the [place] FDA and has been authorized for detection and/or diagnosis of  SARS-CoV-2 by FDA under an Emergency Use Authorization (EUA).  This EUA will remain in effect (meaning this test can be used) for the duration of the COVID-19 declaration under Section 564(b)(1) of the Act, 21 U.S.C. section 360bbb-3(b)(1), unless the authorization is terminated or revoked sooner.  Performed at [facility], [place] [address], [place], [place] [address]      Radiology Studies: No results found.   Marzetta Board, MD, PhD [facility]  Between 7 am - 7 pm I am available, please contact me via Amion (for emergencies) or Securechat (non urgent messages)  Between 7 pm - 7 am I am not available, please contact night coverage MD/APP via Amion

## 2022-05-30 NOTE — Progress Notes (Signed)
PHARMACY - TOTAL PARENTERAL NUTRITION CONSULT NOTE    Indication:  Intolerance to enteral feeding  Patient Measurements: Height: 5' 2.5" (158.8 cm) Weight: 112.5 kg (248 lb) IBW/kg (Calculated) : 51.25 TPN AdjBW (KG): 62.5 Body mass index is 44.64 kg/m.    Assessment:  Pharmacy consulted for TPN in pt intolerant to tube feeds. Pt with significant luminal narrowing at the GE junction. Pt with continued emesis with clear liquid diet.  Glucose / Insulin: A1c 8.2 - CBGs appear to be improving a little (range 170-203 with new TPN bag 9/1) - on SSI q4h (20 units SSI given last 24hr) + 18 units insulin added to TPN bag 9/1 - On dexamethasone 4 mg IV BID x 7 days (through 9/7) Electrolytes:  - Na and Cl still slightly below normal lab range - Phos decreased to WNL - note was given 52mol NaPhos and 321ml Kphos on 8/31 - Mag 2 and K 4.7 Renal:  - SCr 1.15 improved - BUN elevated at 37  Hepatic: LFTs elevated, improving. TG WNL Intake / Output; MIVF: I/O not accurate, no MIVF GI Imaging: UGI 8/27 with near-complete obstruction of the distal esophagus, with only a very small amount of contrast passing into the stomach; no evidence of contrast leak - 8/30 Persistent significant luminal narrowing at the GE junction with only a thin stream of contrast passing through distal esophagus. GI Surgeries / Procedures:  - 8/25 robotic PEH with mesh reinforcement 8/25 - per CCS, plan repeat UGI this weekend, continue CLD pending those results  Central access: 05/25/2022 TPN start date: 05/27/2022  Nutritional Goals: Goal TPN rate is 70 mL/hr (provides 75 g of protein and 1663 kcals per day)    RD Assessment: Estimated Needs Total Energy Estimated Needs: 1500-1700 Total Protein Estimated Needs: 70-85g Total Fluid Estimated Needs: 1.7l/day  Current Nutrition:  Clear liquids  Plan:  Continue TPN and will advance rate to 55 mL/hr Electrolytes in TPN:  Na 75 mEq/L (increased) K 40 mEq/L  (decreased) Ca 5 mEq/L  Mg 10 mEq/L (increased) Phos 10 mmol/L (decreased) Cl:Ac 1:1 Add standard MVI and trace elements to TPN Continue SSI moderate scale q4h With steroids to continue and CBGs continuing to trend up, will increase insulin in TPN from 18 units to 22 units.  Monitor TPN labs on Mon/Thurs and prn   LeKara MeadPharmD, BCPS Clinical Pharmacist 05/30/2022 8:41 AM

## 2022-05-30 NOTE — Progress Notes (Signed)
Mobility Specialist - Progress Note   05/30/22 1224  Mobility  HOB Elevated/Bed Position Self regulated  Activity Ambulated with assistance in hallway  Range of Motion/Exercises Active  Level of Assistance Modified independent, requires aide device or extra time  Assistive Device Front wheel walker  Distance Ambulated (ft) 300 ft  Activity Response Tolerated well  Transport method Ambulatory  $Mobility charge 1 Mobility   Pt received in recliner and agreeable to mobility.  Pt to recliner after session with all needs met.    [facility]

## 2022-05-31 ENCOUNTER — Inpatient Hospital Stay (HOSPITAL_COMMUNITY): Payer: Medicare PPO

## 2022-05-31 DIAGNOSIS — K440 Diaphragmatic hernia with obstruction, without gangrene: Secondary | ICD-10-CM | POA: Diagnosis not present

## 2022-05-31 LAB — GLUCOSE, CAPILLARY
Glucose-Capillary: 242 mg/dL — ABNORMAL HIGH (ref 70–99)
Glucose-Capillary: 229 mg/dL — ABNORMAL HIGH (ref 70–99)
Glucose-Capillary: 128 mg/dL — ABNORMAL HIGH (ref 70–99)
Glucose-Capillary: 184 mg/dL — ABNORMAL HIGH (ref 70–99)
Glucose-Capillary: 218 mg/dL — ABNORMAL HIGH (ref 70–99)

## 2022-05-31 LAB — BASIC METABOLIC PANEL
Anion gap: 7 (ref 5–15)
BUN: 35 mg/dL — ABNORMAL HIGH (ref 8–23)
CO2: 28 mmol/L (ref 22–32)
Calcium: 8.7 mg/dL — ABNORMAL LOW (ref 8.9–10.3)
Chloride: 97 mmol/L — ABNORMAL LOW (ref 98–111)
Creatinine, Ser: 1.27 mg/dL — ABNORMAL HIGH (ref 0.44–1.00)
GFR, Estimated: 45 mL/min — ABNORMAL LOW (ref >60)
Glucose, Bld: 231 mg/dL — ABNORMAL HIGH (ref 70–99)
Potassium: 4.9 mmol/L (ref 3.5–5.1)
Sodium: 132 mmol/L — ABNORMAL LOW (ref 135–145)

## 2022-05-31 LAB — PHOSPHORUS: Phosphorus: 3.8 mg/dL (ref 2.5–4.6)

## 2022-05-31 LAB — MAGNESIUM: Magnesium: 2.2 mg/dL (ref 1.7–2.4)

## 2022-05-31 MED ORDER — TRAVASOL 10 % IV SOLN
INTRAVENOUS | Status: AC
  Filled 2022-05-31: qty 594

## 2022-05-31 NOTE — Progress Notes (Signed)
9 Days Post-Op   Subjective/Chief Complaint: Reports no difficulty swallowing this morning Continue to tolerate liquids   Objective: Vital signs in last 24 hours: Temp:  [97.5 F (36.4 C)-98.7 F (37.1 C)] 97.5 F (36.4 C) (09/03 0352) Pulse Rate:  [55-63] 62 (09/03 0832) Resp:  [18] 18 (09/03 0352) BP: (127-178)/(72-105) 136/75 (09/03 0352) SpO2:  [93 %-97 %] 95 % (09/03 0352) Last BM Date : 05/30/22  Intake/Output from previous day: 09/02 0701 - 09/03 0700 In: 927.5 [P.O.:220; I.V.:707.5] Out: 1700 [Urine:1700] Intake/Output this shift: No intake/output data recorded.  Exam: Looks great this morning Abdomen soft, minimally tender  Lab Results:  Recent Labs    05/29/22 0325 05/30/22 0304  WBC 5.4 6.2  HGB 11.7* 11.7*  HCT 38.4 37.9  PLT 152 157   BMET Recent Labs    05/30/22 0304 05/31/22 0308  NA 133* 132*  K 4.7 4.9  CL 96* 97*  CO2 29 28  GLUCOSE 197* 231*  BUN 33* 35*  CREATININE 1.15* 1.27*  CALCIUM 8.4* 8.7*   PT/INR No results for input(s): "LABPROT", "INR" in the last 72 hours. ABG No results for input(s): "PHART", "HCO3" in the last 72 hours.  Invalid input(s): "PCO2", "PO2"  Studies/Results: No results found.  Anti-infectives: Anti-infectives (From admission, onward)    Start     Dose/Rate Route Frequency Ordered Stop   05/22/22 0947  sodium chloride 0.9 % with cefTRIAXone (ROCEPHIN) ADS Med       Note to Pharmacy: Kyra Leyland E: cabinet override      05/22/22 0947 05/22/22 2159   05/22/22 0947  metroNIDAZOLE (FLAGYL) 500 MG/100ML IVPB       Note to Pharmacy: Kyra Leyland E: cabinet override      05/22/22 0947 05/22/22 1242   05/21/22 1300  cefTRIAXone (ROCEPHIN) 2 g in sodium chloride 0.9 % 100 mL IVPB  Status:  Discontinued       See Hyperspace for full Linked Orders Report.   2 g 200 mL/hr over 30 Minutes Intravenous On call to O.R. 05/21/22 1250 05/22/22 0559   05/21/22 1300  metroNIDAZOLE (FLAGYL) IVPB 500 mg   Status:  Discontinued       See Hyperspace for full Linked Orders Report.   500 mg 100 mL/hr over 60 Minutes Intravenous On call to O.R. 05/21/22 1250 05/22/22 0559       Assessment/Plan: Incarcerated hiatal hernia with volvulus and obstruction S/p robotic PEH with mesh reinforcement 8/25 Dr. Johney Maine --UGI 8/27 with near-complete obstruction of the distal esophagus, with only a very small amount of contrast passing into the stomach; no evidence of contrast leak - repeat UGI 8/31 with Persistent significant luminal narrowing at the GE junction with only a thin stream of contrast passing through distal esophagus.   Continues to improve clinically.  Repeat upper GI to see if swelling at wrap has improved enough to allow [place] to advance her diet  Coralie Keens MD 05/31/2022

## 2022-05-31 NOTE — Progress Notes (Signed)
Mobility Specialist - Progress Note   05/31/22 1013  Mobility  Activity Ambulated with assistance in hallway  Level of Assistance Modified independent, requires aide device or extra time  Assistive Device Front wheel walker  Distance Ambulated (ft) 320 ft  Activity Response Tolerated well  $Mobility charge 1 Mobility   Pt received in bed and agreed to mobilize. No c/o pain nor discomfort during ambulation. Pt returned to chair with all needs met and call bell within reach.   Roderick Pee Mobility Specialist

## 2022-05-31 NOTE — Progress Notes (Signed)
PROGRESS NOTE  EMNET MONK INO:[id number] DOB: 1951/08/31 DOA: 05/20/2022 PCP: Shary Key, DO   LOS: 10 days   Brief Narrative / Interim history: 71 year old female with PMH of breast cancer s/p right lumpectomy and radiation, stage IIIb CKD, type II DM, GERD, large hiatal hernia, HLD, HTN, iron deficiency anemia, morbid obesity, OSA and anemia, presented to the ED on 05/20/2022 for evaluation of acute kidney injury.  She was known to have a large hiatal hernia with intermittent nausea and vomiting, symptoms got progressively worse in the week PTA with progressive nausea and vomiting and unable to keep anything down.  Has a outpatient, she had a upper GI series that showed a possible mass so CT abdomen and pelvis was ordered.  Serum creatinine was checked and was over 4.  She was also noted to be hypoxic in the 80s.  Admitted for incarcerated hiatal hernia with volvulus causing total obstruction, abdominal mass/teratoma likely an incidental finding, acute kidney injury.  General surgery was consulted and s/p paraesophageal hernia repair by Dr. Johney Maine on 8/25.    Subjective / 24h Interval events: She feels well this morning.  No abdominal pain, no nausea or vomiting.  Tolerating pills well  Assesement and Plan: Principal Problem:   Incarcerated hiatal hernia Active Problems:   Class 3 severe obesity with serious comorbidity and body mass index (BMI) of 45.0 to 49.9 in adult [facility])   Essential hypertension   History of breast cancer   Diabetes mellitus (HCC)   Allergic asthma, mild intermittent, uncomplicated   Hyperlipidemia associated with type 2 diabetes mellitus (HCC)   GERD (gastroesophageal reflux disease)   Obstructive sleep apnea   Acute kidney injury superimposed on chronic kidney disease ([place])   Abdominal mass   Toupet fundoplicatiopn 0/96/2836   Principal problem Incarcerated hiatal hernia with volvulus causing total obstruction-General surgery consulted and s/p  paraesophageal hernia repair on 8/25 by Dr. Johney Maine.  Postoperative course complicated by near complete obstruction/edema of the distal esophagus for which she was placed on IV steroids, n.p.o.  Repeat UGI 8/30 with persistent luminal narrowing at the GE junction with only a thin stream of contrast passing through the distal esophagus.  Repeat UGI series hopefully today, further management per general surgery  Active problems Difficult IV access/LUE IV infiltration -Overnight 8/27, left upper extremity IV infiltration with diffuse swelling.  Restricted right upper extremity use due to prior breast cancer surgery.  Given acute on chronic kidney disease, avoiding PICC line.  PCCM placed central line.  LUE venous Doppler without DVT but showed acute SVT involving the left basilic vein and cephalic vein.  Supportive/symptomatic care.  Elevate extremity.  Almost resolved   Acute kidney injury complicating stage IIIb (not 3a) CKD-Baseline creatinine in the 1.4-1.5 range since 2021.  Presented with creatinine of 3.73.  Likely due to dehydration from poor oral intake, GI losses and home losartan and chlorthalidone use (meds held).  No hydronephrosis on CT. with IV fluids, creatinine has returned to baseline.  Creatinine remains at baseline today   Acute transaminitis-LFTs were unremarkable preop and abruptly increased to AST of 360 and ALT of 319 on 8/26.  Unclear etiology.  Did not notice any preop hypotension to suggest shock liver.?  Related to anesthesia.  This continues to improve  Hypernatremia/hyponatremia-due to dehydration, now starting on TPN.  Overall sodium has remained stable,  Hypomagnesemia-replete and continue to monitor  Hypophosphatemia-repleted yesterday, today phosphorus is on the high side  Abdominal mass/possible teratoma -CT A/P 05/20/2022: "  There is a complex, well encapsulated, partial wall calcified cystic mass in the right mid abdomen containing the fatty, soft tissue components and to  a lesser extent small areas of calcifications consistent with mesenteric teratoma measuring approximately 12 x 15 x 15.7 cm". Per general surgery, incidental finding and no urgent need for surgery.   Essential hypertension-regular resumed yesterday, doing well with it.  Blood pressure little bit better but still suboptimal.  Add back her home Cozaar now that her kidney function has returned to her baseline   Diabetes mellitus type II with renal complication's-A1c 8.2 on 8/26.  Tightly controlled CBGs and even had hypoglycemic episode on 8/27, likely due to NPO.  Now she is on TPN, continue sliding scale every 4  CBG (last 3)  Recent Labs    05/30/22 2351 05/31/22 0349 05/31/22 0823  GLUCAP 194* 242* 229*      Anemia-Stable.   GERD-Management as above for hiatal hernia.  PPI.   Asthma-Stable without bronchospasm.  As needed albuterol nebs.   OSA on CPAP-Patient declined CPAP.   Breast cancer, s/p lumpectomy, s/p XRT -See below under CT incidental findings.   Incidental findings on CT A/P 8/23 that need further evaluation, as outpatient as deemed necessary -Questionable mild thickening of the ascending and transverse colonic wall.  Severe diverticulosis of sigmoid colon.  Colonoscopy if not already done recently -Enlarged uterus for age containing multiple fibroids with some fibroid showing popcorn calcifications.  Outpatient GYN consultation -Apparent enlargement of pancreatic head measuring 4.9 x 4.1 cm.  CT/MRI of the abdomen per the pancreatic mass protocol. -1.2 cm nodular density in the retroareolar area lateral left breast.  Needs ultrasound/mammogram.  Scheduled Meds:  carvedilol  25 mg Oral BID WC   Chlorhexidine Gluconate Cloth  6 each Topical Once   dexamethasone (DECADRON) injection  4 mg Intravenous Q12H   enoxaparin (LOVENOX) injection  40 mg Subcutaneous Q24H   insulin aspart  0-15 Units Subcutaneous Q4H   losartan  100 mg Oral Daily   mouth rinse  15 mL Mouth Rinse  4 times per day   pantoprazole (PROTONIX) IV  40 mg Intravenous Daily   sodium chloride flush  10-40 mL Intracatheter Q12H   Continuous Infusions:  ondansetron (ZOFRAN) IV     TPN ADULT (ION) 55 mL/hr at 05/30/22 1740   TPN ADULT (ION)     PRN Meds:.acetaminophen (TYLENOL) oral liquid 160 mg/5 mL, albuterol, hydrALAZINE, metoCLOPramide (REGLAN) injection, morphine injection, ondansetron (ZOFRAN) IV **OR** ondansetron (ZOFRAN) IV, mouth rinse, polyethylene glycol, prochlorperazine, sodium chloride, sodium chloride flush  Diet Orders (From admission, onward)     Start     Ordered   05/28/22 0943  Diet bariatric clear liquid Room service appropriate? Yes; Fluid consistency: Thin  Diet effective now       Question Answer Comment  Room service appropriate? Yes   Fluid consistency: Thin      05/28/22 0942            DVT prophylaxis: enoxaparin (LOVENOX) injection 40 mg Start: 05/25/22 2200   Lab Results  Component Value Date   PLT 157 05/30/2022      Code Status: Full Code  Family Communication: No family at bedside  Status is: Inpatient Remains inpatient appropriate because: severity of illness  Level of care: Med-Surg  Consultants:  General surgery   Objective: Vitals:   05/30/22 2037 05/31/22 0214 05/31/22 0352 05/31/22 0832  BP: (!) 158/97 127/72 136/75   Pulse: (!) 57 (!) 57 [name])  55 62  Resp: '18 18 18   '$ Temp: 97.8 F (36.6 C) 98.7 F (37.1 C) (!) 97.5 F (36.4 C)   TempSrc: Oral Oral Oral   SpO2: 93% 95% 95%   Weight:      Height:        Intake/Output Summary (Last 24 hours) at 05/31/2022 0929 Last data filed at 05/31/2022 6503 Gross per 24 hour  Intake 927.46 ml  Output 1500 ml  Net -572.54 ml    Wt Readings from Last 3 Encounters:  05/30/22 112.5 kg  06/23/21 113.2 kg  04/21/21 110.8 kg    Examination:  Constitutional: NAD Eyes: lids and conjunctivae normal, no scleral icterus ENMT: mmm Neck: normal, supple Respiratory: clear to  auscultation bilaterally, no wheezing, no crackles.  Cardiovascular: Regular rate and rhythm, no murmurs / rubs / gallops. No LE edema. Abdomen: soft, no distention, no tenderness. Bowel sounds positive.  Skin: no rashes Neurologic: no focal deficits, equal strength   Data Reviewed: I have independently reviewed following labs and imaging studies   CBC Recent Labs  Lab 05/26/22 0549 05/27/22 0310 05/28/22 0334 05/29/22 0325 05/30/22 0304  WBC 6.3 6.4 5.5 5.4 6.2  HGB 11.1* 11.5* 11.9* 11.7* 11.7*  HCT 38.7 39.1 39.5 38.4 37.9  PLT 151 162 162 152 157  MCV 84.5 82.5 81.4 79.7* 79.3*  MCH 24.2* 24.3* 24.5* 24.3* 24.5*  MCHC 28.7* 29.4* 30.1 30.5 30.9  RDW 16.5* 16.3* 16.3* 16.4* 16.5*     Recent Labs  Lab 05/26/22 0549 05/27/22 0310 05/28/22 0334 05/29/22 0325 05/30/22 0304 05/31/22 0308  NA 147* 149* 142 134* 133* 132*  K 4.6 4.1 4.1 4.2 4.7 4.9  CL 114* 112* 103 96* 96* 97*  CO2 '30 30 31 31 29 28  '$ GLUCOSE 173* 178* 221* 278* 197* 231*  BUN 36* 38* 37* 33* 33* 35*  CREATININE 1.70* 1.50* 1.51* 1.33* 1.15* 1.27*  CALCIUM 8.2* 8.8* 8.9 8.5* 8.4* 8.7*  AST 90* 52* '31 23 18  '$ --   ALT 209* 162* 118* 88* 64*  --   ALKPHOS 39 40 36* 36* 34*  --   BILITOT 0.6 0.7 0.8 0.7 0.7  --   ALBUMIN 2.8* 2.9* 2.9* 2.8* 2.7*  --   MG  --   --  1.6* 1.7 2.0 2.2     ------------------------------------------------------------------------------------------------------------------ No results for input(s): "CHOL", "HDL", "LDLCALC", "TRIG", "CHOLHDL", "LDLDIRECT" in the last 72 hours.   Lab Results  Component Value Date   HGBA1C 8.2 (H) 05/23/2022   ------------------------------------------------------------------------------------------------------------------ No results for input(s): "TSH", "T4TOTAL", "T3FREE", "THYROIDAB" in the last 72 hours.  Invalid input(s): "FREET3"  Cardiac Enzymes No results for input(s): "CKMB", "TROPONINI", "MYOGLOBIN" in the last 168  hours.  Invalid input(s): "CK" ------------------------------------------------------------------------------------------------------------------ No results found for: "BNP"  CBG: Recent Labs  Lab 05/30/22 1513 05/30/22 2039 05/30/22 2351 05/31/22 0349 05/31/22 0823  GLUCAP 192* 211* 194* 242* 229*     No results found for this or any previous visit (from the past 240 hour(s)).    Radiology Studies: No results found.   Marzetta Board, MD, PhD [facility]  Between 7 am - 7 pm I am available, please contact me via Amion (for emergencies) or Securechat (non urgent messages)  Between 7 pm - 7 am I am not available, please contact night coverage MD/APP via Amion

## 2022-05-31 NOTE — Progress Notes (Signed)
PHARMACY - TOTAL PARENTERAL NUTRITION CONSULT NOTE    Indication:  Intolerance to enteral feeding  Patient Measurements: Height: 5' 2.5" (158.8 cm) Weight: 112.5 kg (248 lb) IBW/kg (Calculated) : 51.25 TPN AdjBW (KG): 62.5 Body mass index is 44.64 kg/m.    Assessment:  Pharmacy consulted for TPN in pt intolerant to tube feeds. Pt with significant luminal narrowing at the GE junction. Pt with continued emesis with clear liquid diet.  Glucose / Insulin: A1c 8.2 - CBGs rising again (range 192-242) - on SSI q4h ( 29units SSI given last 24hr) + 22 units insulin now in TPN bag from 9/2 - On dexamethasone 4 mg IV BID x 7 days (through 9/7) Electrolytes:  - Na and Cl still slightly below normal lab range - Phos still WNL - Mag 2.2 and K 4.9 Renal:  - SCr 1.27 up some - BUN 35 up Hepatic: LFTs elevated, improving. TG WNL Intake / Output; MIVF: I/O not accurate, no MIVF GI Imaging: UGI 8/27 with near-complete obstruction of the distal esophagus, with only a very small amount of contrast passing into the stomach; no evidence of contrast leak - 8/30 Persistent significant luminal narrowing at the GE junction with only a thin stream of contrast passing through distal esophagus. - plan repeat UGI to see if can advance diet per CCS note 9/3, continue CLD pending those results GI Surgeries / Procedures:  - 8/25 robotic PEH with mesh reinforcement 8/25  Central access: 05/25/2022 TPN start date: 05/27/2022  Nutritional Goals: Goal TPN rate is 70 mL/hr (provides 75 g of protein and 1548 kcals per day)    RD Assessment: Estimated Needs Total Energy Estimated Needs: 1500-1700 Total Protein Estimated Needs: 70-85g Total Fluid Estimated Needs: 1.7l/day  Current Nutrition:  Clear liquids  Plan:  Continue TPN at current rate of 55 mL/hr until CBGs better controlled. Will reduce amount of dextrose in TPN this evening to hopefully help with CBGs Electrolytes in TPN:  Na 100 mEq/L  (increased) K 35 mEq/L (decreased) Ca 5 mEq/L  Mg 10 mEq/L (increased) Phos 10 mmol/L (decreased) Cl:Ac 1:1 Add standard MVI and trace elements to TPN Continue SSI moderate scale q4h With steroids to continue and CBGs continuing to trend up, Will increase insulin in TPN from 22 units to 25 units, not as much of increase as would normally do due to also a reduction in dextrose in TPN Monitor TPN labs on Mon/Thurs and prn   Kara Mead, PharmD, BCPS Clinical Pharmacist 05/31/2022 8:59 AM

## 2022-06-01 DIAGNOSIS — K440 Diaphragmatic hernia with obstruction, without gangrene: Secondary | ICD-10-CM | POA: Diagnosis not present

## 2022-06-01 LAB — GLUCOSE, CAPILLARY
Glucose-Capillary: 168 mg/dL — ABNORMAL HIGH (ref 70–99)
Glucose-Capillary: 184 mg/dL — ABNORMAL HIGH (ref 70–99)
Glucose-Capillary: 189 mg/dL — ABNORMAL HIGH (ref 70–99)
Glucose-Capillary: 202 mg/dL — ABNORMAL HIGH (ref 70–99)
Glucose-Capillary: 244 mg/dL — ABNORMAL HIGH (ref 70–99)
Glucose-Capillary: 290 mg/dL — ABNORMAL HIGH (ref 70–99)

## 2022-06-01 LAB — COMPREHENSIVE METABOLIC PANEL
ALT: 46 U/L — ABNORMAL HIGH (ref 0–44)
AST: 16 U/L (ref 15–41)
Albumin: 2.7 g/dL — ABNORMAL LOW (ref 3.5–5.0)
Alkaline Phosphatase: 34 U/L — ABNORMAL LOW (ref 38–126)
Anion gap: 6 (ref 5–15)
BUN: 33 mg/dL — ABNORMAL HIGH (ref 8–23)
CO2: 26 mmol/L (ref 22–32)
Calcium: 8.6 mg/dL — ABNORMAL LOW (ref 8.9–10.3)
Chloride: 99 mmol/L (ref 98–111)
Creatinine, Ser: 1.25 mg/dL — ABNORMAL HIGH (ref 0.44–1.00)
GFR, Estimated: 46 mL/min — ABNORMAL LOW (ref >60)
Glucose, Bld: 173 mg/dL — ABNORMAL HIGH (ref 70–99)
Potassium: 4.8 mmol/L (ref 3.5–5.1)
Sodium: 131 mmol/L — ABNORMAL LOW (ref 135–145)
Total Bilirubin: 0.6 mg/dL (ref 0.3–1.2)
Total Protein: 5.6 g/dL — ABNORMAL LOW (ref 6.5–8.1)

## 2022-06-01 LAB — MAGNESIUM: Magnesium: 2.2 mg/dL (ref 1.7–2.4)

## 2022-06-01 LAB — PHOSPHORUS: Phosphorus: 3.5 mg/dL (ref 2.5–4.6)

## 2022-06-01 LAB — TRIGLYCERIDES: Triglycerides: 80 mg/dL (ref <150)

## 2022-06-01 MED ORDER — TRAVASOL 10 % IV SOLN
INTRAVENOUS | Status: DC
  Filled 2022-06-01: qty 594

## 2022-06-01 MED ORDER — GLUCERNA SHAKE PO LIQD
237.0000 mL | Freq: Three times a day (TID) | ORAL | Status: DC
  Administered 2022-06-01 (×2): 237 mL via ORAL
  Filled 2022-06-01 (×6): qty 237

## 2022-06-01 NOTE — Care Management Important Message (Signed)
Important Message  Patient Details IM Letter given to the Patient. Name: [name] MRN: [id number] Date of Birth: Sep 23, 1951   Medicare Important Message Given:  Yes     Kerin Salen 06/01/2022, 1:44 PM

## 2022-06-01 NOTE — Progress Notes (Signed)
PHARMACY - TOTAL PARENTERAL NUTRITION CONSULT NOTE    Indication:  Intolerance to enteral feeding  Patient Measurements: Height: 5' 2.5" (158.8 cm) Weight: 112.5 kg (248 lb) IBW/kg (Calculated) : 51.25 TPN AdjBW (KG): 62.5 Body mass index is 44.64 kg/m.    Assessment:  Pharmacy consulted for TPN in pt intolerant to tube feeds. Pt with significant luminal narrowing at the GE junction. Pt with continued emesis with clear liquid diet.  Glucose / Insulin: A1c 8.2 - CBGs appears to be trending down this AM after dextrose amount in TPN reduced and insulin in TPN increased to 25 units - continues on SSI q4h ( 23units SSI given last 24hr) + 25 units insulin now in TPN bag from 9/3 - On dexamethasone 4 mg IV BID x 7 days (through 9/7) Electrolytes:  - Na still below normal lab range - Mag 2.2 and K 4.8 Renal:  - SCr 1.25 stable - BUN 33 down Hepatic: LFTs elevated, improving. TG WNL Intake / Output; MIVF: I/O not accurate, no MIVF GI Imaging: UGI 8/27 with near-complete obstruction of the distal esophagus, with only a very small amount of contrast passing into the stomach; no evidence of contrast leak - 8/30 Persistent significant luminal narrowing at the GE junction with only a thin stream of contrast passing through distal esophagus. - plan repeat UGI to see if can advance diet per CCS note 9/3, continue CLD pending those results GI Surgeries / Procedures:  - 8/25 robotic PEH with mesh reinforcement 8/25  Central access: 05/25/2022 TPN start date: 05/27/2022  Nutritional Goals: Goal TPN rate is 70 mL/hr (provides 75 g of protein and 1548 kcals per day)    RD Assessment: Estimated Needs Total Energy Estimated Needs: 1500-1700 Total Protein Estimated Needs: 70-85g Total Fluid Estimated Needs: 1.7l/day  Current Nutrition:  Clear liquids  Plan:  Continue TPN at current rate of 55 mL/hr until CBGs better controlled over 24hr period Electrolytes in TPN:  Na 125 mEq/L  (increased) K 30 mEq/L (decreased) Ca 5 mEq/L  Mg 10 mEq/L (increased) Phos 10 mmol/L (decreased) Cl:Ac 1:1 Add standard MVI and trace elements to TPN Continue SSI moderate scale q4h With steroids to continue and CBGs continuing to trend up, continue insulin in TPN at 25 units. Want to see if CBGs continue to stabilize before making further adjustments. Note that dexamethasone scheduled to stop on 9/7 so will need to monitor CBGs very closely at that point Monitor TPN labs on Mon/Thurs and prn   Kara Mead, PharmD, BCPS Clinical Pharmacist 06/01/2022 7:34 AM

## 2022-06-01 NOTE — Progress Notes (Signed)
10 Days Post-Op   Subjective/Chief Complaint: No complaints Continues to tolerate fulls without nausea or abdominal pain   Objective: Vital signs in last 24 hours: Temp:  [97.5 F (36.4 C)-97.7 F (36.5 C)] 97.5 F (36.4 C) (09/04 0626) Pulse Rate:  [46-64] 61 (09/04 0807) Resp:  [16-18] 16 (09/04 0626) BP: (145-179)/(68-91) 155/73 (09/04 0626) SpO2:  [95 %-98 %] 98 % (09/04 0626) Last BM Date : 05/30/22  Intake/Output from previous day: 09/03 0701 - 09/04 0700 In: 1815.2 [P.O.:480; I.V.:1335.2] Out: 400 [Urine:400] Intake/Output this shift: No intake/output data recorded.  Exam: Awake and alert Looks well Abdomen soft, NT/ND  Lab Results:  Recent Labs    05/30/22 0304  WBC 6.2  HGB 11.7*  HCT 37.9  PLT 157   BMET Recent Labs    05/31/22 0308 06/01/22 0304  NA 132* 131*  K 4.9 4.8  CL 97* 99  CO2 28 26  GLUCOSE 231* 173*  BUN 35* 33*  CREATININE 1.27* 1.25*  CALCIUM 8.7* 8.6*   PT/INR No results for input(s): "LABPROT", "INR" in the last 72 hours. ABG No results for input(s): "PHART", "HCO3" in the last 72 hours.  Invalid input(s): "PCO2", "PO2"  Studies/Results: DG ESOPHAGUS W SINGLE CM (SOL OR THIN BA)  Result Date: 05/31/2022 CLINICAL DATA:  Patient with history of incarcerated hiatal hernia s/p Toupet fundoplication 7/74/12. Previous esophagram shows persistent narrowing at the GE junction. Request for repeat esophagram today to assess for improvement in swelling at the surgical site. Per patient swallowing has improved since last esophagram. EXAM: ESOPHAGUS/BARIUM SWALLOW/TABLET STUDY TECHNIQUE: Single contrast examination was performed using thin liquid barium. This exam was performed by Candiss Norse, PA-C, and was supervised and interpreted by Sabino Dick, MD. FLUOROSCOPY: Radiation Exposure Index (as provided by the fluoroscopic device): 41.30 mGy Kerma COMPARISON:  Esophagram 05/24/22, 05/27/22 FINDINGS: Swallowing: Appears normal. No  vestibular penetration or aspiration seen. Pharynx: Unremarkable. Esophagus: Mildly dilated. Persistent distal narrowing of the esophagus at the gastroesophageal junction essentially unchanged compared to previous studies. Esophageal motility: Delayed emptying at the gastroesophageal junction. Hiatal Hernia: None. Gastroesophageal reflux: None visualized. Ingested 13 mm barium tablet: Not given Other: No contrast leak noted. IMPRESSION: Persistent distal narrowing of the esophagus at the GE junction essentially unchanged compared to previous studies. Electronically Signed   By: Marijo Conception M.D.   On: 05/31/2022 11:05    Anti-infectives: Anti-infectives (From admission, onward)    Start     Dose/Rate Route Frequency Ordered Stop   05/22/22 0947  sodium chloride 0.9 % with cefTRIAXone (ROCEPHIN) ADS Med       Note to Pharmacy: Kyra Leyland E: cabinet override      05/22/22 0947 05/22/22 2159   05/22/22 0947  metroNIDAZOLE (FLAGYL) 500 MG/100ML IVPB       Note to Pharmacy: Kyra Leyland E: cabinet override      05/22/22 0947 05/22/22 1242   05/21/22 1300  cefTRIAXone (ROCEPHIN) 2 g in sodium chloride 0.9 % 100 mL IVPB  Status:  Discontinued       See Hyperspace for full Linked Orders Report.   2 g 200 mL/hr over 30 Minutes Intravenous On call to O.R. 05/21/22 1250 05/22/22 0559   05/21/22 1300  metroNIDAZOLE (FLAGYL) IVPB 500 mg  Status:  Discontinued       See Hyperspace for full Linked Orders Report.   500 mg 100 mL/hr over 60 Minutes Intravenous On call to O.R. 05/21/22 1250 05/22/22 0559  Assessment/Plan: Incarcerated hiatal hernia with volvulus and obstruction S/p robotic PEH with mesh reinforcement 8/25 Dr. Johney Maine --UGI 8/27 with near-complete obstruction of the distal esophagus, with only a very small amount of contrast passing into the stomach; no evidence of contrast leak - repeat UGI 8/31 with Persistent significant luminal narrowing at the GE junction with only a  thin stream of contrast passing through distal esophagus.  -repeat UGI 9/3 unchanged from 8/31  Clinically she looks well so will try full liquids.  If she develops nausea, bloating, or pain, will have to back her back to clears Continue TNA  Coralie Keens MD 06/01/2022

## 2022-06-01 NOTE — Progress Notes (Signed)
PROGRESS NOTE  [name] RAQ:[id number] DOB: 1951/09/20 DOA: 05/20/2022 PCP: Shary Key, DO   LOS: 11 days   Brief Narrative / Interim history: 71 year old female with PMH of breast cancer s/p right lumpectomy and radiation, stage IIIb CKD, type II DM, GERD, large hiatal hernia, HLD, HTN, iron deficiency anemia, morbid obesity, OSA and anemia, presented to the ED on 05/20/2022 for evaluation of acute kidney injury.  She was known to have a large hiatal hernia with intermittent nausea and vomiting, symptoms got progressively worse in the week PTA with progressive nausea and vomiting and unable to keep anything down.  Has a outpatient, she had a upper GI series that showed a possible mass so CT abdomen and pelvis was ordered.  Serum creatinine was checked and was over 4.  She was also noted to be hypoxic in the 80s.  Admitted for incarcerated hiatal hernia with volvulus causing total obstruction, abdominal mass/teratoma likely an incidental finding, acute kidney injury.  General surgery was consulted and s/p paraesophageal hernia repair by Dr. Johney Maine on 8/25.    Subjective / 24h Interval events: Feeling well.  No abdominal pain, no nausea or vomiting.  Ambulating in the hallway  Assesement and Plan: Principal Problem:   Incarcerated hiatal hernia Active Problems:   Class 3 severe obesity with serious comorbidity and body mass index (BMI) of 45.0 to 49.9 in adult [facility])   Essential hypertension   History of breast cancer   Diabetes mellitus (HCC)   Allergic asthma, mild intermittent, uncomplicated   Hyperlipidemia associated with type 2 diabetes mellitus (HCC)   GERD (gastroesophageal reflux disease)   Obstructive sleep apnea   Acute kidney injury superimposed on chronic kidney disease ([place])   Abdominal mass   Toupet fundoplicatiopn 4/56/2563   Principal problem Incarcerated hiatal hernia with volvulus causing total obstruction-General surgery consulted and s/p paraesophageal  hernia repair on 8/25 by Dr. Johney Maine.  Postoperative course complicated by near complete obstruction/edema of the distal esophagus.  Repeat upper GI series on 8/30 as well as 9/3 shows persistent luminal narrowing at the GE junction with only a thin stream of contrast passing through the distal esophagus.  Despite imaging, she feels well and tolerating pills.  Discussed with the surgical team this morning, advance her to full liquid diet, add nutritional supplements.  If she is able to tolerate dose and maintain a good caloric intake could potentially go home and would not need TPN  Active problems LUE IV infiltration -resolved   Acute kidney injury complicating stage IIIb (not 3a) CKD-Baseline creatinine in the 1.4-1.5 range since 2021.  Presented with creatinine of 3.73.  Likely due to dehydration from poor oral intake, GI losses. No hydronephrosis on CT. with IV fluids, creatinine has returned to baseline.  Creatinine remains at baseline today   Acute transaminitis-LFTs were unremarkable preop and abruptly increased to AST of 360 and ALT of 319 on 8/26.  Unclear etiology.  Did not notice any preop hypotension to suggest shock liver.?  Related to anesthesia.  This continues to improve  Hypernatremia/hyponatremia-due to dehydration, now starting on TPN.  Overall sodium has remained stable, 131 this morning  Hypomagnesemia-magnesium normal at 2.2  Hypophosphatemia-phosphorus now normalized  Abdominal mass/possible teratoma -CT A/P 05/20/2022: "There is a complex, well encapsulated, partial wall calcified cystic mass in the right mid abdomen containing the fatty, soft tissue components and to a lesser extent small areas of calcifications consistent with mesenteric teratoma measuring approximately 12 x 15 x 15.7 cm". Per  general surgery, incidental finding and no urgent need for surgery.   Essential hypertension-continue Coreg and losartan.  Renal function is stable   Diabetes mellitus type II with  renal complication's-A1c 8.2 on 8/26.  Tightly controlled CBGs and even had hypoglycemic episode on 8/27.  Now she is on TPN, continue sliding scale every 4  CBG (last 3)  Recent Labs    06/01/22 0001 06/01/22 0356 06/01/22 0727  GLUCAP 202* 189* 184*      Anemia-Stable.   GERD-Management as above for hiatal hernia.  PPI.   Asthma-Stable without bronchospasm.  As needed albuterol nebs.   OSA on CPAP-Patient declined CPAP.   Breast cancer, s/p lumpectomy, s/p XRT -See below under CT incidental findings.   Incidental findings on CT A/P 8/23 that need further evaluation, as outpatient as deemed necessary -Questionable mild thickening of the ascending and transverse colonic wall.  Severe diverticulosis of sigmoid colon.  Colonoscopy if not already done recently -Enlarged uterus for age containing multiple fibroids with some fibroid showing popcorn calcifications.  Outpatient GYN consultation -Apparent enlargement of pancreatic head measuring 4.9 x 4.1 cm.  CT/MRI of the abdomen per the pancreatic mass protocol. -1.2 cm nodular density in the retroareolar area lateral left breast.  Needs ultrasound/mammogram.  Scheduled Meds:  carvedilol  25 mg Oral BID WC   Chlorhexidine Gluconate Cloth  6 each Topical Once   dexamethasone (DECADRON) injection  4 mg Intravenous Q12H   enoxaparin (LOVENOX) injection  40 mg Subcutaneous Q24H   feeding supplement (GLUCERNA SHAKE)  237 mL Oral TID BM   insulin aspart  0-15 Units Subcutaneous Q4H   losartan  100 mg Oral Daily   mouth rinse  15 mL Mouth Rinse 4 times per day   pantoprazole (PROTONIX) IV  40 mg Intravenous Daily   sodium chloride flush  10-40 mL Intracatheter Q12H   Continuous Infusions:  ondansetron (ZOFRAN) IV     TPN ADULT (ION) 55 mL/hr at 05/31/22 1752   TPN ADULT (ION)     PRN Meds:.acetaminophen (TYLENOL) oral liquid 160 mg/5 mL, albuterol, hydrALAZINE, metoCLOPramide (REGLAN) injection, morphine injection, ondansetron  (ZOFRAN) IV **OR** ondansetron (ZOFRAN) IV, mouth rinse, polyethylene glycol, prochlorperazine, sodium chloride, sodium chloride flush  Diet Orders (From admission, onward)     Start     Ordered   06/01/22 0839  Diet full liquid Room service appropriate? Yes; Fluid consistency: Thin  Diet effective now       Question Answer Comment  Room service appropriate? Yes   Fluid consistency: Thin      06/01/22 0838            DVT prophylaxis: enoxaparin (LOVENOX) injection 40 mg Start: 05/25/22 2200   Lab Results  Component Value Date   PLT 157 05/30/2022      Code Status: Full Code  Family Communication: No family at bedside  Status is: Inpatient Remains inpatient appropriate because: severity of illness  Level of care: Med-Surg  Consultants:  General surgery   Objective: Vitals:   05/31/22 1626 05/31/22 2213 06/01/22 0626 06/01/22 0807  BP:  (!) 145/68 (!) 155/73   Pulse: 64 (!) 50 (!) 46 61  Resp:  18 16   Temp:  (!) 97.5 F (36.4 C) (!) 97.5 F (36.4 C)   TempSrc:  Oral Oral   SpO2:  95% 98%   Weight:      Height:        Intake/Output Summary (Last 24 hours) at 06/01/2022 1009 Last data  filed at 06/01/2022 0700 Gross per 24 hour  Intake 1695.19 ml  Output 400 ml  Net 1295.19 ml    Wt Readings from Last 3 Encounters:  05/30/22 112.5 kg  06/23/21 113.2 kg  04/21/21 110.8 kg    Examination:  Constitutional: NAD Eyes: lids and conjunctivae normal, no scleral icterus ENMT: mmm Neck: normal, supple Respiratory: clear to auscultation bilaterally, no wheezing, no crackles. Normal respiratory effort.  Cardiovascular: Regular rate and rhythm, no murmurs / rubs / gallops. No LE edema. Abdomen: soft, no distention, no tenderness. Bowel sounds positive.  Skin: no rashes Neurologic: no focal deficits, equal strength   Data Reviewed: I have independently reviewed following labs and imaging studies   CBC Recent Labs  Lab 05/26/22 0549 05/27/22 0310  05/28/22 0334 05/29/22 0325 05/30/22 0304  WBC 6.3 6.4 5.5 5.4 6.2  HGB 11.1* 11.5* 11.9* 11.7* 11.7*  HCT 38.7 39.1 39.5 38.4 37.9  PLT 151 162 162 152 157  MCV 84.5 82.5 81.4 79.7* 79.3*  MCH 24.2* 24.3* 24.5* 24.3* 24.5*  MCHC 28.7* 29.4* 30.1 30.5 30.9  RDW 16.5* 16.3* 16.3* 16.4* 16.5*     Recent Labs  Lab 05/27/22 0310 05/28/22 0334 05/29/22 0325 05/30/22 0304 05/31/22 0308 06/01/22 0304  NA 149* 142 134* 133* 132* 131*  K 4.1 4.1 4.2 4.7 4.9 4.8  CL 112* 103 96* 96* 97* 99  CO2 '30 31 31 29 28 26  '$ GLUCOSE 178* 221* 278* 197* 231* 173*  BUN 38* 37* 33* 33* 35* 33*  CREATININE 1.50* 1.51* 1.33* 1.15* 1.27* 1.25*  CALCIUM 8.8* 8.9 8.5* 8.4* 8.7* 8.6*  AST 52* '31 23 18  '$ --  16  ALT 162* 118* 88* 64*  --  46*  ALKPHOS 40 36* 36* 34*  --  34*  BILITOT 0.7 0.8 0.7 0.7  --  0.6  ALBUMIN 2.9* 2.9* 2.8* 2.7*  --  2.7*  MG  --  1.6* 1.7 2.0 2.2 2.2     ------------------------------------------------------------------------------------------------------------------ Recent Labs    06/01/22 0304  TRIG 80     Lab Results  Component Value Date   HGBA1C 8.2 (H) 05/23/2022   ------------------------------------------------------------------------------------------------------------------ No results for input(s): "TSH", "T4TOTAL", "T3FREE", "THYROIDAB" in the last 72 hours.  Invalid input(s): "FREET3"  Cardiac Enzymes No results for input(s): "CKMB", "TROPONINI", "MYOGLOBIN" in the last 168 hours.  Invalid input(s): "CK" ------------------------------------------------------------------------------------------------------------------ No results found for: "BNP"  CBG: Recent Labs  Lab 05/31/22 1608 05/31/22 2017 06/01/22 0001 06/01/22 0356 06/01/22 0727  GLUCAP 184* 218* 202* 189* 184*     No results found for this or any previous visit (from the past 240 hour(s)).    Radiology Studies: DG ESOPHAGUS W SINGLE CM (SOL OR THIN BA)  Result Date:  05/31/2022 CLINICAL DATA:  Patient with history of incarcerated hiatal hernia s/p Toupet fundoplication 8/78/67. Previous esophagram shows persistent narrowing at the GE junction. Request for repeat esophagram today to assess for improvement in swelling at the surgical site. Per patient swallowing has improved since last esophagram. EXAM: ESOPHAGUS/BARIUM SWALLOW/TABLET STUDY TECHNIQUE: Single contrast examination was performed using thin liquid barium. This exam was performed by Candiss Norse, PA-C, and was supervised and interpreted by Sabino Dick, MD. FLUOROSCOPY: Radiation Exposure Index (as provided by the fluoroscopic device): 41.30 mGy Kerma COMPARISON:  Esophagram 05/24/22, 05/27/22 FINDINGS: Swallowing: Appears normal. No vestibular penetration or aspiration seen. Pharynx: Unremarkable. Esophagus: Mildly dilated. Persistent distal narrowing of the esophagus at the gastroesophageal junction essentially unchanged compared to previous studies.  Esophageal motility: Delayed emptying at the gastroesophageal junction. Hiatal Hernia: None. Gastroesophageal reflux: None visualized. Ingested 13 mm barium tablet: Not given Other: No contrast leak noted. IMPRESSION: Persistent distal narrowing of the esophagus at the GE junction essentially unchanged compared to previous studies. Electronically Signed   By: Marijo Conception M.D.   On: 05/31/2022 11:05     Marzetta Board, MD, PhD [facility]  Between 7 am - 7 pm I am available, please contact me via Amion (for emergencies) or Securechat (non urgent messages)  Between 7 pm - 7 am I am not available, please contact night coverage MD/APP via Amion

## 2022-06-01 NOTE — Progress Notes (Signed)
Mobility Specialist - Progress Note   06/01/22 0918  Mobility  HOB Elevated/Bed Position Self regulated  Activity Ambulated with assistance in hallway  Range of Motion/Exercises Active  Level of Assistance Modified independent, requires aide device or extra time  Assistive Device  (IV Pole)  Distance Ambulated (ft) 250 ft  Activity Response Tolerated well  Transport method Ambulatory  $Mobility charge 1 Mobility   Pt received in bed and agreeable to mobility. No C/o of pain. Pt to bed after session with all needs met.     [facility]

## 2022-06-02 DIAGNOSIS — K440 Diaphragmatic hernia with obstruction, without gangrene: Secondary | ICD-10-CM | POA: Diagnosis not present

## 2022-06-02 LAB — BASIC METABOLIC PANEL
Anion gap: 5 (ref 5–15)
BUN: 40 mg/dL — ABNORMAL HIGH (ref 8–23)
CO2: 29 mmol/L (ref 22–32)
Calcium: 8.7 mg/dL — ABNORMAL LOW (ref 8.9–10.3)
Chloride: 100 mmol/L (ref 98–111)
Creatinine, Ser: 1.28 mg/dL — ABNORMAL HIGH (ref 0.44–1.00)
GFR, Estimated: 44 mL/min — ABNORMAL LOW (ref >60)
Glucose, Bld: 207 mg/dL — ABNORMAL HIGH (ref 70–99)
Potassium: 5.1 mmol/L (ref 3.5–5.1)
Sodium: 134 mmol/L — ABNORMAL LOW (ref 135–145)

## 2022-06-02 LAB — GLUCOSE, CAPILLARY
Glucose-Capillary: 206 mg/dL — ABNORMAL HIGH (ref 70–99)
Glucose-Capillary: 200 mg/dL — ABNORMAL HIGH (ref 70–99)
Glucose-Capillary: 196 mg/dL — ABNORMAL HIGH (ref 70–99)
Glucose-Capillary: 179 mg/dL — ABNORMAL HIGH (ref 70–99)
Glucose-Capillary: 222 mg/dL — ABNORMAL HIGH (ref 70–99)

## 2022-06-02 LAB — CBC
HCT: 37.9 % (ref 36.0–46.0)
Hemoglobin: 11.4 g/dL — ABNORMAL LOW (ref 12.0–15.0)
MCH: 24.2 pg — ABNORMAL LOW (ref 26.0–34.0)
MCHC: 30.1 g/dL (ref 30.0–36.0)
MCV: 80.5 fL (ref 80.0–100.0)
Platelets: 175 10*3/uL (ref 150–400)
RBC: 4.71 MIL/uL (ref 3.87–5.11)
RDW: 16.3 % — ABNORMAL HIGH (ref 11.5–15.5)
WBC: 7.1 10*3/uL (ref 4.0–10.5)
nRBC: 0 % (ref 0.0–0.2)

## 2022-06-02 MED ORDER — DEXAMETHASONE 2 MG PO TABS: ORAL_TABLET | ORAL | 0 refills | Status: AC

## 2022-06-02 MED ORDER — GLUCERNA SHAKE PO LIQD: 237.0000 mL | Freq: Three times a day (TID) | ORAL | 0 refills | Status: AC

## 2022-06-02 MED ORDER — TRAVASOL 10 % IV SOLN
INTRAVENOUS | Status: AC
  Filled 2022-06-02: qty 27

## 2022-06-02 MED ORDER — FUROSEMIDE 10 MG/ML IJ SOLN
40.0000 mg | Freq: Once | INTRAMUSCULAR | Status: AC
  Administered 2022-06-02: 40 mg via INTRAVENOUS
  Filled 2022-06-02: qty 4

## 2022-06-02 NOTE — Inpatient Diabetes Management (Signed)
Inpatient Diabetes Program Recommendations  AACE/ADA: New Consensus Statement on Inpatient Glycemic Control (2015)  Target Ranges:  Prepandial:   less than 140 mg/dL      Peak postprandial:   less than 180 mg/dL (1-2 hours)      Critically ill patients:  140 - 180 mg/dL    Latest Reference Range & Units 06/01/22 00:01 06/01/22 03:56 06/01/22 07:27 06/01/22 11:57 06/01/22 15:53 06/01/22 20:15  Glucose-Capillary 70 - 99 mg/dL 202 (H)  5 units Novolog   189 (H)  3 units Novolog  184 (H)  3 units Novolog  168 (H)  3 units Novolog  244 (H)  4 units Novolog  290 (H)  8 units Novolog   (H): Data is abnormally high  Latest Reference Range & Units 06/02/22 00:06 06/02/22 04:30  Glucose-Capillary 70 - 99 mg/dL 206 (H)  5 units Novolog  200 (H)  3 units Novolog   (H): Data is abnormally high    Home DM Meds: Jardiance 10 mg daily  Current Orders: Novolog Moderate Correction Scale/ SSI (0-15 units) Q4 hours    MD/Pharmacy- Note pt getting: Decadron 4 mg BID  TPN 55cc/hr (+25 units Insulin in the TPN solution)  Pt will get Decadron until 09/07  May consider Increasing the Insulin in the TPN slightly today  Or, could Increase the Novolog SSI to the 0-20 unit Resistant scale    --Will follow patient during hospitalization--  Wyn Quaker RN, MSN, [place] Diabetes Coordinator Inpatient Glycemic Control Team Team Pager: [phone] (8a-5p)

## 2022-06-02 NOTE — Progress Notes (Signed)
Mobility Specialist - Progress Note    06/02/22 1051  Mobility  HOB Elevated/Bed Position Self regulated  Activity Ambulated with assistance in hallway  Range of Motion/Exercises Active  Level of Assistance Independent after set-up  Assistive Device  (IV Pole)  Distance Ambulated (ft) 250 ft  Activity Response Tolerated well  Transport method Ambulatory  $Mobility charge 1 Mobility   Pt received in bathroom and agreeable to mobility. Pt to recliner after session with all needs met.    [facility]

## 2022-06-02 NOTE — Progress Notes (Signed)
Assessment & Plan: POD#11 - Incarcerated hiatal hernia with volvulus and obstruction - S/p robotic PEH with mesh reinforcement 8/25 Dr. Johney Maine  UGI series with very tight wrap on sequential studies  Tolerating full liquid diet without pain, nausea, emesis  Would discontinue TNA today  Probable discharge home tomorrow on full liquid diet  Will arrange follow up at [place] with Dr. Joya Martyr, [facility] [place] [facility] Office: [phone]        Chief Complaint: Paraesophageal hernia  Subjective: Patient in bed, comfortable, tolerating full liquid diet, having BM's.  Objective: Vital signs in last 24 hours: Temp:  [98.1 F (36.7 C)-98.3 F (36.8 C)] 98.3 F (36.8 C) (09/05 0502) Pulse Rate:  [51-60] 60 (09/05 0811) Resp:  [18] 18 (09/05 0502) BP: (158-169)/(72-86) 158/86 (09/05 0502) SpO2:  [96 %-97 %] 97 % (09/05 0502) Weight:  [116.6 kg] 116.6 kg (09/05 0500) Last BM Date : 06/01/22  Intake/Output from previous day: 09/04 0701 - 09/05 0700 In: 2087.3 [P.O.:1040; I.V.:1047.3] Out: 1350 [Urine:1350] Intake/Output this shift: No intake/output data recorded.  Physical Exam: HEENT - sclerae clear, mucous membranes moist Neck - soft Abdomen - soft without distension; minimal tenderness at incisions; active BS present Neuro - alert & oriented, no focal deficits  Lab Results:  Recent Labs    06/02/22 0434  WBC 7.1  HGB 11.4*  HCT 37.9  PLT 175   BMET Recent Labs    06/01/22 0304 06/02/22 0434  NA 131* 134*  K 4.8 5.1  CL 99 100  CO2 26 29  GLUCOSE 173* 207*  BUN 33* 40*  CREATININE 1.25* 1.28*  CALCIUM 8.6* 8.7*   PT/INR No results for input(s): "LABPROT", "INR" in the last 72 hours. Comprehensive Metabolic Panel:    Component Value Date/Time   NA 134 (L) 06/02/2022 0434   NA 131 (L) 06/01/2022 0304   NA 141 12/10/2020 1027   NA 144 11/18/2020 1544   NA 143 04/12/2017 0939   NA 141 02/14/2015 0811   K 5.1  06/02/2022 0434   K 4.8 06/01/2022 0304   K 4.2 04/12/2017 0939   K 3.9 02/14/2015 0811   CL 100 06/02/2022 0434   CL 99 06/01/2022 0304   CL 104 02/02/2013 1127   CL 102 10/06/2012 0919   CO2 29 06/02/2022 0434   CO2 26 06/01/2022 0304   CO2 30 (H) 04/12/2017 0939   CO2 25 02/14/2015 0811   BUN 40 (H) 06/02/2022 0434   BUN 33 (H) 06/01/2022 0304   BUN 25 12/10/2020 1027   BUN 22 11/18/2020 1544   BUN 20.5 04/12/2017 0939   BUN 22.1 02/14/2015 0811   CREATININE 1.28 (H) 06/02/2022 0434   CREATININE 1.25 (H) 06/01/2022 0304   CREATININE 1.2 (H) 04/12/2017 0939   CREATININE 1.4 (H) 02/14/2015 0811   GLUCOSE 207 (H) 06/02/2022 0434   GLUCOSE 173 (H) 06/01/2022 0304   GLUCOSE 70 04/12/2017 0939   GLUCOSE 108 02/14/2015 0811   GLUCOSE 95 02/02/2013 1127   GLUCOSE 102 (H) 10/06/2012 0919   CALCIUM 8.7 (L) 06/02/2022 0434   CALCIUM 8.6 (L) 06/01/2022 0304   CALCIUM 9.8 04/12/2017 0939   CALCIUM 9.7 02/14/2015 0811   AST 16 06/01/2022 0304   AST 18 05/30/2022 0304   AST 15 04/12/2017 0939   AST 13 02/14/2015 0811   ALT 46 (H) 06/01/2022 0304   ALT 64 (H) 05/30/2022 0304  ALT 25 04/12/2017 0939   ALT 10 02/14/2015 0811   ALKPHOS 34 (L) 06/01/2022 0304   ALKPHOS 34 (L) 05/30/2022 0304   ALKPHOS 67 04/12/2017 0939   ALKPHOS 80 02/14/2015 0811   BILITOT 0.6 06/01/2022 0304   BILITOT 0.7 05/30/2022 0304   BILITOT 0.2 12/10/2020 1027   BILITOT <0.2 02/20/2020 1855   BILITOT 0.53 04/12/2017 0939   BILITOT 0.34 02/14/2015 0811   PROT 5.6 (L) 06/01/2022 0304   PROT 5.8 (L) 05/30/2022 0304   PROT 6.7 12/10/2020 1027   PROT 6.8 02/20/2020 1855   PROT 7.0 04/12/2017 0939   PROT 7.2 02/14/2015 0811   ALBUMIN 2.7 (L) 06/01/2022 0304   ALBUMIN 2.7 (L) 05/30/2022 0304   ALBUMIN 3.8 12/10/2020 1027   ALBUMIN 3.9 02/20/2020 1855   ALBUMIN 3.3 (L) 04/12/2017 0939   ALBUMIN 3.4 (L) 02/14/2015 0811    Studies/Results: DG ESOPHAGUS W SINGLE CM (SOL OR THIN BA)  Result Date:  05/31/2022 CLINICAL DATA:  Patient with history of incarcerated hiatal hernia s/p Toupet fundoplication 11/22/47. Previous esophagram shows persistent narrowing at the GE junction. Request for repeat esophagram today to assess for improvement in swelling at the surgical site. Per patient swallowing has improved since last esophagram. EXAM: ESOPHAGUS/BARIUM SWALLOW/TABLET STUDY TECHNIQUE: Single contrast examination was performed using thin liquid barium. This exam was performed by Candiss Norse, PA-C, and was supervised and interpreted by Sabino Dick, MD. FLUOROSCOPY: Radiation Exposure Index (as provided by the fluoroscopic device): 41.30 mGy Kerma COMPARISON:  Esophagram 05/24/22, 05/27/22 FINDINGS: Swallowing: Appears normal. No vestibular penetration or aspiration seen. Pharynx: Unremarkable. Esophagus: Mildly dilated. Persistent distal narrowing of the esophagus at the gastroesophageal junction essentially unchanged compared to previous studies. Esophageal motility: Delayed emptying at the gastroesophageal junction. Hiatal Hernia: None. Gastroesophageal reflux: None visualized. Ingested 13 mm barium tablet: Not given Other: No contrast leak noted. IMPRESSION: Persistent distal narrowing of the esophagus at the GE junction essentially unchanged compared to previous studies. Electronically Signed   By: Marijo Conception M.D.   On: 05/31/2022 11:05      Armandina Gemma 06/02/2022   Patient ID: Hilaria Ota, female   DOB: April 22, 1951, 71 y.o.   MRN: [id number]

## 2022-06-02 NOTE — Discharge Summary (Signed)
Physician Discharge Summary  [name] KGY:[id number] DOB: 1951/09/14 DOA: 05/20/2022  PCP: Shary Key, DO  Admit date: 05/20/2022 Discharge date: 06/02/2022  Admitted From: home Disposition:  home  Recommendations for Outpatient Follow-up:  Follow up with PCP in 1-2 weeks Follow-up with general surgery in 1 week  Home Health: None Equipment/Devices: None  Discharge Condition: Stable CODE STATUS: Full code Diet recommendation: Full liquid + Glucerna TID  HPI: Per admitting MD, [name] is a 71 y.o. female with medical history significant of obesity, anemia, hypertension, diabetes, CKD 3A, asthma, hyperlipidemia, breast cancer, GERD, OSA presenting with abnormal lab value. Patient was in the process of being worked up for ongoing nausea and vomiting for the past week.  She was scheduled to have an outpatient CT performed but her labs came back showing an elevated creatinine and she was sent to the ED for further evaluation.  She reports productive cough in addition to her nausea vomiting for the last week.  Has had decreased p.o. intake and has continued to take antihypertensives. She denies fevers, chills, chest pain, shortness of breath, abdominal pain, constipation, diarrhea.  Hospital Course / Discharge diagnoses: Principal Problem:   Incarcerated hiatal hernia Active Problems:   Class 3 severe obesity with serious comorbidity and body mass index (BMI) of 45.0 to 49.9 in adult [facility])   Essential hypertension   History of breast cancer   Diabetes mellitus (HCC)   Allergic asthma, mild intermittent, uncomplicated   Hyperlipidemia associated with type 2 diabetes mellitus (HCC)   GERD (gastroesophageal reflux disease)   Obstructive sleep apnea   Acute kidney injury superimposed on chronic kidney disease ([place])   Abdominal mass   Toupet fundoplicatiopn 0/26/3785   Principal problem Incarcerated hiatal hernia with volvulus causing total obstruction-General surgery  consulted and s/p paraesophageal hernia repair on 8/25 by Dr. Johney Maine.  Postoperative course complicated by near complete obstruction/edema of the distal esophagus.  Repeat upper GI series on 8/30 as well as 9/3 shows persistent luminal narrowing at the GE junction with only a thin stream of contrast passing through the distal esophagus.  Despite imaging, she feels well and tolerating pills and a full liquid diet.  She is able to tolerate that, along with Glucerna for nutritional supplements, it may take 1 to 2 weeks to resolve, but she is stable for discharge and will be sent home in stable condition.  She was also placed on Decadron to help with inflammation, she will receive a quick taper.  She will have outpatient follow-up with surgery   Active problems LUE IV infiltration -resolved Acute kidney injury complicating stage IIIb (not 3a) CKD-Baseline creatinine in the 1.4-1.5 range since 2021.  Presented with creatinine of 3.73.  Likely due to dehydration from poor oral intake, GI losses. No hydronephrosis on CT. with IV fluids, creatinine has returned to baseline Acute transaminitis-LFTs were unremarkable preop and abruptly increased to AST of 360 and ALT of 319 on 8/26.  Unclear etiology.  Resolved Hypernatremia/hyponatremia-due to dehydration, IV fluids, fluctuated but overall stable, sodium 134 on discharge.  Repeat as an outpatient Hypomagnesemia-magnesium normal at 2.2 Hypophosphatemia-phosphorus now normalized Abdominal mass/possible teratoma -CT A/P 05/20/2022: "There is a complex, well encapsulated, partial wall calcified cystic mass in the right mid abdomen containing the fatty, soft tissue components and to a lesser extent small areas of calcifications consistent with mesenteric teratoma measuring approximately 12 x 15 x 15.7 cm". Per general surgery, incidental finding and no urgent need for surgery. Essential  hypertension-continue Coreg and losartan.  Renal function is stable Diabetes mellitus  type II with renal complication's-A1c 8.2 on 8/26.  Resume home medications Anemia-Stable. GERD-Management as above for hiatal hernia. Asthma-Stable without bronchospasm.  As needed albuterol nebs. OSA on CPAP-Patient declined CPAP. Breast cancer, s/p lumpectomy, s/p XRT -See below under CT incidental findings. Incidental findings on CT A/P 8/23 that need further evaluation, as outpatient as deemed necessary -Questionable mild thickening of the ascending and transverse colonic wall.  Severe diverticulosis of sigmoid colon.  Colonoscopy if not already done recently -Enlarged uterus for age containing multiple fibroids with some fibroid showing popcorn calcifications.  Outpatient GYN consultation -Apparent enlargement of pancreatic head measuring 4.9 x 4.1 cm.  CT/MRI of the abdomen per the pancreatic mass protocol. -1.2 cm nodular density in the retroareolar area lateral left breast.  Needs ultrasound/mammogram.  Sepsis ruled out   Discharge Instructions   Allergies as of 06/02/2022       Reactions   Nsaids Other (See Comments)   Chronic kidney disease   Diprivan [propofol]    Oxygen Levels Lowered        Medication List     STOP taking these medications    Vitamin D (Ergocalciferol) 1.25 MG (50000 UNIT) Caps capsule Commonly known as: DRISDOL       TAKE these medications    albuterol 108 (90 Base) MCG/ACT inhaler Commonly known as: ProAir HFA Inhale 2 puffs into the lungs every 6 (six) hours as needed for wheezing or shortness of breath.   Breo Ellipta 200-25 MCG/ACT Aepb Generic drug: fluticasone furoate-vilanterol TAKE 1 PUFF BY MOUTH EVERY DAY   carvedilol 25 MG tablet Commonly known as: COREG Take 1 tablet (25 mg total) by mouth 2 (two) times daily with a meal.   chlorthalidone 25 MG tablet Commonly known as: HYGROTON Take 25 mg by mouth daily.   cholecalciferol 1000 units tablet Commonly known as: VITAMIN D Take 1,000 Units by mouth daily.   Vitamin D  50 MCG (2000 UT) tablet Take 2,000 Units by mouth daily.   dexamethasone 2 MG tablet Commonly known as: DECADRON Take 2 tablets (4 mg total) by mouth daily for 3 days, THEN 1 tablet (2 mg total) daily for 3 days. Start taking on: June 02, 2022   feeding supplement (GLUCERNA SHAKE) Liqd Take 237 mLs by mouth 3 (three) times daily between meals.   GNP True Metrix Glucose Strips test strip Generic drug: glucose blood Use as instructed   Jardiance 10 MG Tabs tablet Generic drug: empagliflozin Take 10 mg by mouth daily.   Kerendia 20 MG Tabs Generic drug: Finerenone Take 1 tablet by mouth daily.   losartan 100 MG tablet Commonly known as: COZAAR Take 100 mg by mouth daily.   omeprazole 40 MG capsule Commonly known as: PRILOSEC Take 40 mg by mouth 2 (two) times daily.   ondansetron 4 MG tablet Commonly known as: ZOFRAN Take 4 mg by mouth 3 (three) times daily as needed for nausea or vomiting.               Durable Medical Equipment  (From admission, onward)           Start     Ordered   05/25/22 1229  For home use only DME Walker rolling  Once       Question Answer Comment  Walker: With 5 Inch Wheels   Patient needs a walker to treat with the following condition Weakness  05/25/22 1229            Follow-up Information     Michael Boston, MD. Go on 06/15/2022.   Specialties: General Surgery, Colon and Rectal Surgery Why: Your appointment is on 06/15/22, arrive at 1:45 pm Bring photo ID and insurance information Contact information: [place] [address] [phone]                 Consultations: General surgery  Procedures/Studies:  DG ESOPHAGUS W SINGLE CM (SOL OR THIN BA)  Result Date: 05/31/2022 CLINICAL DATA:  Patient with history of incarcerated hiatal hernia s/p Toupet fundoplication 3/66/29. Previous esophagram shows persistent narrowing at the GE junction. Request for repeat esophagram today to  assess for improvement in swelling at the surgical site. Per patient swallowing has improved since last esophagram. EXAM: ESOPHAGUS/BARIUM SWALLOW/TABLET STUDY TECHNIQUE: Single contrast examination was performed using thin liquid barium. This exam was performed by Candiss Norse, PA-C, and was supervised and interpreted by Sabino Dick, MD. FLUOROSCOPY: Radiation Exposure Index (as provided by the fluoroscopic device): 41.30 mGy Kerma COMPARISON:  Esophagram 05/24/22, 05/27/22 FINDINGS: Swallowing: Appears normal. No vestibular penetration or aspiration seen. Pharynx: Unremarkable. Esophagus: Mildly dilated. Persistent distal narrowing of the esophagus at the gastroesophageal junction essentially unchanged compared to previous studies. Esophageal motility: Delayed emptying at the gastroesophageal junction. Hiatal Hernia: None. Gastroesophageal reflux: None visualized. Ingested 13 mm barium tablet: Not given Other: No contrast leak noted. IMPRESSION: Persistent distal narrowing of the esophagus at the GE junction essentially unchanged compared to previous studies. Electronically Signed   By: Marijo Conception M.D.   On: 05/31/2022 11:05   DG ESOPHAGUS W SINGLE CM (SOL OR THIN BA)  Result Date: 05/27/2022 CLINICAL DATA:  Patient with history of repair of incarcerated hiatal hernia with volvulus/obstruction, toupet fundoplication on 4/76/54 EXAM: ESOPHAGUS WITH WATER SOLUBLE CONTRAST TECHNIQUE: Single contrast examination was performed using thin liquid barium. This exam was performed by Nash Mantis and was supervised and interpreted by Dr. Suzy Bouchard FLUOROSCOPY: Radiation Exposure Index (as provided by the fluoroscopic device): 21.80 mGy Kerma COMPARISON:  Esophagram on 05/24/22 FINDINGS: Swallowing: Appears normal. No vestibular penetration or aspiration seen. Pharynx: Unremarkable. Esophagus:mildly dilated ; persistent distal narrowing of the esophagus at the GE junction. There anchors over  approximately 5 cm segment with only a thin stream of contrast passing through the distal esophagus. Esophageal motility: Delayed emptying at GE junction Hiatal Hernia: None. Gastroesophageal reflux: None visualized. Ingested 58m barium tablet: Not given Other: No definitive evidence of contrast leak IMPRESSION: Persistent significant luminal narrowing at the GE junction with only a thin stream of contrast passing through distal esophagus. Electronically Signed   By: SSuzy BouchardM.D.   On: 05/27/2022 09:40   VAS [place]PPER EXTREMITY VENOUS DUPLEX  Result Date: 05/25/2022 UPPER VENOUS STUDY  Patient Name:  JRANIAH KARAN Date of Exam:   05/25/2022 Medical Rec #: [id number]     Accession #:    [id number] of Birth: [birth year]     Patient Gender: F Patient Age:   89years [facility]:      VAS [place]ER EXTREMITY VENOUS DUPLEX Referring Phys: BMargie Billet--------------------------------------------------------------------------------  Indications: Swelling Risk Factors: None identified. Comparison Study: No prior studies. Performing Technologist: GOliver HumRVT  Examination Guidelines: A complete evaluation includes B-mode imaging, spectral Doppler, color Doppler, and power Doppler as needed of all accessible portions of each vessel. Bilateral  testing is considered an integral part of a complete examination. Limited examinations for reoccurring indications may be performed as noted.  Right Findings: +----------+------------+---------+-----------+----------+-------+ RIGHT     CompressiblePhasicitySpontaneousPropertiesSummary +----------+------------+---------+-----------+----------+-------+ Subclavian    Full       Yes       Yes                      +----------+------------+---------+-----------+----------+-------+  Left Findings: +----------+------------+---------+-----------+----------+-------+ LEFT       CompressiblePhasicitySpontaneousPropertiesSummary +----------+------------+---------+-----------+----------+-------+ IJV           Full       Yes       Yes                      +----------+------------+---------+-----------+----------+-------+ Subclavian    Full       Yes       Yes                      +----------+------------+---------+-----------+----------+-------+ Axillary      Full       Yes       Yes                      +----------+------------+---------+-----------+----------+-------+ Brachial      Full       Yes       Yes                      +----------+------------+---------+-----------+----------+-------+ Radial        Full                                          +----------+------------+---------+-----------+----------+-------+ Ulnar         Full                                          +----------+------------+---------+-----------+----------+-------+ Cephalic      None                                   Acute  +----------+------------+---------+-----------+----------+-------+ Basilic       None                                   Acute  +----------+------------+---------+-----------+----------+-------+ Thrombus located in the cephalic vein is noted to be in the antecubital fossa. Thrombus located in the basilic vein is noted to be in the distal upper arm.  Summary:  Right: No evidence of thrombosis in the subclavian.  Left: No evidence of deep vein thrombosis in the upper extremity. Findings consistent with acute superficial vein thrombosis involving the left basilic vein and left cephalic vein.  *See table(s) above for measurements and observations.  Diagnosing physician: Servando Snare MD Electronically signed by Servando Snare MD on 05/25/2022 at 4:34:14 PM.    Final    DG Chest Port 1 View  Result Date: 05/25/2022 CLINICAL DATA:  Central line care EXAM: PORTABLE CHEST 1 VIEW COMPARISON:  May 25, 2022 FINDINGS: A left central line is  been placed in the interval. The distal tip is in the central SVC. No pneumothorax. Layering  effusion with underlying atelectasis on the left. Mild atelectasis in the right base. The cardiomediastinal silhouette is stable. IMPRESSION: Interval insertion of a left central line terminating in the central SVC without pneumothorax. Layering left effusion with underlying atelectasis. Mild right basilar atelectasis. Electronically Signed   By: Dorise Bullion III M.D.   On: 05/25/2022 15:13   DG CHEST PORT 1 VIEW  Result Date: 05/25/2022 CLINICAL DATA:  Shortness of breath. History of asthma. Recent fundoplication for hiatal hernia. EXAM: PORTABLE CHEST 1 VIEW COMPARISON:  Swallowing study done yesterday.  05/20/2022. FINDINGS: S heart size is normal. The patient has not taken a deep inspiration. Mild volume loss at the lung bases related to that. Surgical drain in the upper abdomen. There is some free air beneath the diaphragm, presumably secondary to the recent surgery. Previously administered contrast is visible in the distal esophagus. IMPRESSION: Previously administered oral contrast remains visible in the distal esophagus. Surgical drain in the upper abdomen. Some free intraperitoneal air remains visible, presumably subsequent to the previous surgery. Poor inspiration.  Mild volume loss at the lung bases. Electronically Signed   By: Nelson Chimes M.D.   On: 05/25/2022 08:51   DG ESOPHAGUS W SINGLE CM (SOL OR THIN BA)  Result Date: 05/24/2022 CLINICAL DATA:  Two days postop from fundoplication for large hiatal hernia. EXAM: ESOPHAGRAM WITH WATER-SOLUBLE CONTRAST TECHNIQUE: Single contrast examination was performed using water-soluble Omnipaque 300 contrast. This exam was performed by Durenda Guthrie, PA-C, and was supervised and interpreted by Earle Gell, MD. FLUOROSCOPY: Radiation Exposure Index (as provided by the fluoroscopic device): 22.6 mGy Kerma COMPARISON:  None Available. FINDINGS: Scout image shows a left  abdominal surgical drain, and pre-existing contrast material in the left colon which is attributable to prior CT. In addition, a nasogastric tube is seen which is looped back on itself in the proximal thoracic esophagus. The patient vomited during the exam, and the nasogastric tube came out. Patient drank 50 mL of Omnipaque 300. The esophagus is mildly dilated, and there is new complete obstruction of the distal esophagus with a "bird beak" appearance. Only a tiny amount of contrast passes into the stomach. No evidence of contrast leak or extravasation. Patient vomited, and the NG tube came into her mouth. The NG tube was therefore removed. IMPRESSION: Near-complete obstruction of the distal esophagus, with only a very small amount of contrast passing into the stomach. No evidence of contrast leak or extravasation. The patient vomited after drinking oral contrast contrast, and the nasogastric tube came out of her mouth, and was therefore removed. These results will be called to the ordering clinician or representative by the Radiologist Assistant, and communication documented in the PACS or [employer]. Electronically Signed   By: Marlaine Hind M.D.   On: 05/24/2022 11:29   CT ABDOMEN PELVIS WO CONTRAST  Result Date: 05/20/2022 CLINICAL DATA:  Nausea/vomiting, AKI EXAM: CT ABDOMEN AND PELVIS WITHOUT CONTRAST TECHNIQUE: Multidetector CT imaging of the abdomen and pelvis was performed following the standard protocol without IV contrast. RADIATION DOSE REDUCTION: This exam was performed according to the departmental dose-optimization program which includes automated exposure control, adjustment of the mA and/or kV according to patient size and/or use of iterative reconstruction technique. COMPARISON:  None Available. FINDINGS: Lower chest: Large sliding hiatal hernia containing the stomach. Left basilar atelectasis. Hepatobiliary: No focal liver abnormality is seen. Gallbladder is contracted containing  radiopaque gallstone. No radiographic evidence of cholecystitis. No biliary dilatation. Pancreas: Apparent prominence of the head of the  pancreas measuring 4.9 x 4.1 cm (image 36/2). Spleen: Normal in size without focal abnormality. Adrenals/Urinary Tract: Adrenal glands are unremarkable. Kidneys are normal, without renal calculi, focal lesion, or hydronephrosis. Bladder is unremarkable. Stomach/Bowel: Large sliding hiatal hernia containing part of the stomach. Appendix appears normal. Apparent mild thickening of the ascending and transverse colonic wall. Severe diverticulosis of the sigmoid colon without diverticulitis. Vascular/Lymphatic: Aortic atherosclerosis. No enlarged abdominal or pelvic lymph nodes. Reproductive: Multiple uterine fibroids. Some of the fibroids show popcorn calcifications. Other: There is a complex, well encapsulated, partial wall calcified mass at the right mid abdomen measuring 12 cm AP by 15 cm transverse by 15.7 cm craniocaudad. The mass contains soft tissue and lipomatous components. There are small areas of coarse calcifications seen in the posterior aspect of this cystic mass. There is course calcification of the posterior wall of the mass seen (images 41 through 70 of series 5). Musculoskeletal: There is 1.2 cm nodular soft tissue density seen in the retroareolar mid breast region (image 2/2). With loss of the disc space, endplate marginal osteophytes and endplate sclerosis. IMPRESSION: 1. There is a complex, well encapsulated, partial wall calcified cystic mass in the right mid abdomen containing the fatty, soft tissue components and to a lesser extent small areas of calcifications consistent with mesenteric teratoma measuring approximately 12 x 15 x 15.7 cm. Recommend surgical consultation. 2. Large sliding-type hiatal hernia containing part of the stomach. The herniated stomach shows some thickening of the gastric wall. 3. Questionable mild thickening of the ascending and  transverse colonic wall. Severe diverticulosis of the sigmoid colon without diverticulitis. 4. Uterus is enlarged for the patient's age containing multiple fibroids with some of the fibroids showing popcorn calcifications. 5. Cholelithiasis in the contracted gallbladder without cholecystitis. 6. Apparent enlargement of the head of the pancreas measuring 4.9 x 4.1 cm. Recommend CT/MRI of the abdomen per the pancreatic mass protocol. 7.  Aortic atherosclerosis (ICD10-I70.0) 8. 1.2 cm nodular density in the retroareolar left breast. Correlation with ultrasound/mammogram is suggested. 9. Thoracolumbar spondylosis and is most prominent at L5-S1. At L5-S1, there is loss of the disc space, endplate sclerosis with sclerosis extending into the adjacent vertebral bodies, and prominent endplate osteophytes seen. These findings most probably on the basis of degeneration less likely infection. Clinical and laboratory correlation is suggested in the appropriate clinical setting. Electronically Signed   By: Frazier Richards M.D.   On: 05/20/2022 17:58   DG Chest 2 View  Result Date: 05/20/2022 CLINICAL DATA:  Cough, hypoxia EXAM: CHEST - 2 VIEW COMPARISON:  03/06/2022 FINDINGS: Transverse diameter of heart is increased. Left hemidiaphragm is elevated. Central pulmonary vessels are prominent. There is poor inspiration. Linear densities are seen in the lower lung fields, more so on the left side. IMPRESSION: Central pulmonary vessels are prominent which may be due to poor inspiration. There are no signs of alveolar pulmonary edema. There are linear densities in both lower lung fields suggesting subsegmental atelectasis. Electronically Signed   By: Elmer Picker M.D.   On: 05/20/2022 17:40     Subjective: - no chest pain, shortness of breath, no abdominal pain, nausea or vomiting.   Discharge Exam: BP 138/61 (BP Location: Left Arm)   Pulse (!) 51   Temp 97.7 F (36.5 C) (Oral)   Resp 17   Ht 5' 2.5" (1.588 m)   Wt  116.6 kg   SpO2 99%   BMI 46.27 kg/m   General: Pt is alert, awake, not in acute distress Cardiovascular:  RRR, S1/S2 +, no rubs, no gallops Respiratory: CTA bilaterally, no wheezing, no rhonchi Abdominal: Soft, NT, ND, bowel sounds + Extremities: no edema, no cyanosis    The results of significant diagnostics from this hospitalization (including imaging, microbiology, ancillary and laboratory) are listed below for reference.     Microbiology: No results found for this or any previous visit (from the past 240 hour(s)).   Labs: Basic Metabolic Panel: Recent Labs  Lab 05/28/22 0334 05/29/22 0325 05/30/22 0304 05/31/22 0308 06/01/22 0304 06/02/22 0434  NA 142 134* 133* 132* 131* 134*  K 4.1 4.2 4.7 4.9 4.8 5.1  CL 103 96* 96* 97* 99 100  CO2 '31 31 29 28 26 29  '$ GLUCOSE 221* 278* 197* 231* 173* 207*  BUN 37* 33* 33* 35* 33* 40*  CREATININE 1.51* 1.33* 1.15* 1.27* 1.25* 1.28*  CALCIUM 8.9 8.5* 8.4* 8.7* 8.6* 8.7*  MG 1.6* 1.7 2.0 2.2 2.2  --   PHOS <1.0* 4.9* 3.6 3.8 3.5  --    Liver Function Tests: Recent Labs  Lab 05/27/22 0310 05/28/22 0334 05/29/22 0325 05/30/22 0304 06/01/22 0304  AST 52* '31 23 18 16  '$ ALT 162* 118* 88* 64* 46*  ALKPHOS 40 36* 36* 34* 34*  BILITOT 0.7 0.8 0.7 0.7 0.6  PROT 6.4* 6.2* 6.0* 5.8* 5.6*  ALBUMIN 2.9* 2.9* 2.8* 2.7* 2.7*   CBC: Recent Labs  Lab 05/27/22 0310 05/28/22 0334 05/29/22 0325 05/30/22 0304 06/02/22 0434  WBC 6.4 5.5 5.4 6.2 7.1  HGB 11.5* 11.9* 11.7* 11.7* 11.4*  HCT 39.1 39.5 38.4 37.9 37.9  MCV 82.5 81.4 79.7* 79.3* 80.5  PLT 162 162 152 157 175   CBG: Recent Labs  Lab 06/01/22 2015 06/02/22 0006 06/02/22 0430 06/02/22 0725 06/02/22 1149  GLUCAP 290* 206* 200* 196* 179*   Hgb A1c No results for input(s): "HGBA1C" in the last 72 hours. Lipid Profile Recent Labs    06/01/22 0304  TRIG 80   Thyroid function studies No results for input(s): "TSH", "T4TOTAL", "T3FREE", "THYROIDAB" in the last 72  hours.  Invalid input(s): "FREET3" Urinalysis    Component Value Date/Time   COLORURINE YELLOW 05/20/2022 1806   APPEARANCEUR HAZY (A) 05/20/2022 1806   LABSPEC 1.011 05/20/2022 1806   PHURINE 5.0 05/20/2022 1806   GLUCOSEU >=500 (A) 05/20/2022 1806   HGBUR SMALL (A) 05/20/2022 1806   BILIRUBINUR NEGATIVE 05/20/2022 1806   KETONESUR NEGATIVE 05/20/2022 1806   PROTEINUR NEGATIVE 05/20/2022 1806   UROBILINOGEN 1.0 05/10/2014 1010   NITRITE NEGATIVE 05/20/2022 1806   LEUKOCYTESUR NEGATIVE 05/20/2022 1806    FURTHER DISCHARGE INSTRUCTIONS:   Get Medicines reviewed and adjusted: Please take all your medications with you for your next visit with your Primary MD   Laboratory/radiological data: Please request your Primary MD to go over all hospital tests and procedure/radiological results at the follow up, please ask your Primary MD to get all Hospital records sent to his/her office.   In some cases, they will be blood work, cultures and biopsy results pending at the time of your discharge. Please request that your primary care M.D. goes through all the records of your hospital data and follows up on these results.   Also Note the following: If you experience worsening of your admission symptoms, develop shortness of breath, life threatening emergency, suicidal or homicidal thoughts you must seek medical attention immediately by calling 911 or calling your MD immediately  if symptoms less severe.   You must read complete instructions/literature along  with all the possible adverse reactions/side effects for all the Medicines you take and that have been prescribed to you. Take any new Medicines after you have completely understood and accpet all the possible adverse reactions/side effects.    Do not drive when taking Pain medications or sleeping medications (Benzodaizepines)   Do not take more than prescribed Pain, Sleep and Anxiety Medications. It is not advisable to combine anxiety,sleep  and pain medications without talking with your primary care practitioner   Special Instructions: If you have smoked or chewed Tobacco  in the last 2 yrs please stop smoking, stop any regular Alcohol  and or any Recreational drug use.   Wear Seat belts while driving.   Please note: You were cared for by a hospitalist during your hospital stay. Once you are discharged, your primary care physician will handle any further medical issues. Please note that NO REFILLS for any discharge medications will be authorized once you are discharged, as it is imperative that you return to your primary care physician (or establish a relationship with a primary care physician if you do not have one) for your post hospital discharge needs so that they can reassess your need for medications and monitor your lab values.  Time coordinating discharge: 40 minutes  SIGNED:  Marzetta Board, MD, PhD 06/02/2022, 3:25 PM

## 2022-06-19 ENCOUNTER — Encounter: Payer: Self-pay | Admitting: Family Medicine

## 2022-06-19 ENCOUNTER — Ambulatory Visit (INDEPENDENT_AMBULATORY_CARE_PROVIDER_SITE_OTHER): Payer: Medicare PPO | Admitting: Family Medicine

## 2022-06-19 VITALS — BP 124/57 | HR 88 | Ht 62.0 in | Wt 230.2 lb

## 2022-06-19 DIAGNOSIS — Z09 Encounter for follow-up examination after completed treatment for conditions other than malignant neoplasm: Secondary | ICD-10-CM | POA: Diagnosis not present

## 2022-06-19 DIAGNOSIS — Z23 Encounter for immunization: Secondary | ICD-10-CM

## 2022-06-19 NOTE — Progress Notes (Unsigned)
    SUBJECTIVE:   CHIEF COMPLAINT / HPI:   Presents for hospital follow up, was on triad service  Principal problem Incarcerated hiatal hernia with volvulus causing total obstruction-General surgery consulted and s/p paraesophageal hernia repair on 8/25 by Dr. Johney Maine.  Postoperative course complicated by near complete obstruction/edema of the distal esophagus.  Repeat upper GI series on 8/30 as well as 9/3 shows persistent luminal narrowing at the GE junction with only a thin stream of contrast passing through the distal esophagus.  Despite imaging, she feels well and tolerating pills and a full liquid diet.  She is able to tolerate that, along with Glucerna for nutritional supplements, it may take 1 to 2 weeks to resolve, but she is stable for discharge and will be sent home in stable condition.  She was also placed on Decadron to help with inflammation, she will receive a quick taper.  She will have outpatient follow-up with surgery  No Issues wallowing. Back to regular diet tolerating well. Normal bowel movements   [facility] 3 months for meds   Flu vaccine   PERTINENT  PMH / PSH: ***  OBJECTIVE:   BP (!) 124/57   Pulse 88   Ht '5\' 2"'$  (1.575 m)   Wt 230 lb 3.2 oz (104.4 kg)   SpO2 96%   BMI 42.10 kg/m    Physical exam General: well appearing, NAD Cardiovascular: RRR, no murmurs Lungs: CTAB. Normal WOB Abdomen: soft, non-distended, non-tender Skin: warm, dry. No edema  ASSESSMENT/PLAN:   No problem-specific Assessment & Plan notes found for this encounter.   Status post urgent reduction and robotic repair of incarcerated hiatal hernia Had moderate-sized paraesophageal hiatal hernia with all of the stomach in the mediastinum tolerated procedure well.  Had follow-up with surgeon this past week  Solid mass of pancreatic head  Health maintenance  Flu vaccine today  Did home FOBT in July which was fine  Health maintenance Received flu vaccine Follow-up scheduled for A1c  check, and physical exam to follow-up on other health maintenance items on addressed  [place]

## 2022-06-19 NOTE — Patient Instructions (Addendum)
It was great seeing you today!  You came in for hospital follow up and I am glad you are doing better!  Today you also received your flu vaccine. Call me back with any medications you need filled.  I have scheduled [place] for follow-up in 2 months for full physical and we will repeat blood work at that time but if you need anything before then just give me a call.  Feel free to call with any questions or concerns at any time, at [phone].   Take care,  Dr. Shary Key [facility]

## 2022-06-23 ENCOUNTER — Ambulatory Visit (HOSPITAL_COMMUNITY): Payer: Medicare PPO

## 2022-06-30 ENCOUNTER — Encounter (HOSPITAL_COMMUNITY): Payer: Self-pay

## 2022-06-30 ENCOUNTER — Ambulatory Visit (HOSPITAL_COMMUNITY): Payer: Medicare PPO

## 2022-07-23 ENCOUNTER — Ambulatory Visit (HOSPITAL_COMMUNITY)
Admission: RE | Admit: 2022-07-23 | Discharge: 2022-07-23 | Disposition: A | Discharge status: Home / Self Care | Payer: Medicare PPO | Source: Ambulatory Visit | Attending: Gastroenterology | Admitting: Gastroenterology

## 2022-07-23 DIAGNOSIS — R1112 Projectile vomiting: Secondary | ICD-10-CM | POA: Insufficient documentation

## 2022-07-23 DIAGNOSIS — R933 Abnormal findings on diagnostic imaging of other parts of digestive tract: Secondary | ICD-10-CM | POA: Insufficient documentation

## 2022-07-23 LAB — POCT I-STAT CREATININE: Creatinine, Ser: 1.3 mg/dL — ABNORMAL HIGH (ref 0.44–1.00)

## 2022-07-23 MED ORDER — IOHEXOL 300 MG/ML  SOLN
100.0000 mL | Freq: Once | INTRAMUSCULAR | Status: AC | PRN
  Administered 2022-07-23: 100 mL via INTRAVENOUS

## 2022-07-23 MED ORDER — SODIUM CHLORIDE (PF) 0.9 % IJ SOLN
INTRAMUSCULAR | Status: AC
  Filled 2022-07-23: qty 50

## 2022-08-12 ENCOUNTER — Other Ambulatory Visit: Payer: Self-pay

## 2022-08-12 NOTE — Progress Notes (Signed)
The proposed treatment discussed in conference is for discussion purpose only and is not a binding recommendation.  The patients have not been physically examined, or presented with their treatment options.  Therefore, final treatment plans cannot be decided.  

## 2022-08-25 ENCOUNTER — Encounter: Payer: Self-pay | Admitting: Family Medicine

## 2022-09-14 ENCOUNTER — Encounter: Payer: Self-pay | Admitting: Family Medicine

## 2022-09-14 ENCOUNTER — Ambulatory Visit (INDEPENDENT_AMBULATORY_CARE_PROVIDER_SITE_OTHER): Payer: Medicare PPO | Admitting: Family Medicine

## 2022-09-14 VITALS — BP 148/73 | HR 92 | Wt 229.2 lb

## 2022-09-14 DIAGNOSIS — E119 Type 2 diabetes mellitus without complications: Secondary | ICD-10-CM

## 2022-09-14 DIAGNOSIS — Z0000 Encounter for general adult medical examination without abnormal findings: Secondary | ICD-10-CM | POA: Diagnosis not present

## 2022-09-14 DIAGNOSIS — E1169 Type 2 diabetes mellitus with other specified complication: Secondary | ICD-10-CM

## 2022-09-14 DIAGNOSIS — I10 Essential (primary) hypertension: Secondary | ICD-10-CM

## 2022-09-14 LAB — POCT GLYCOSYLATED HEMOGLOBIN (HGB A1C): HbA1c, POC (controlled diabetic range): 7.3 % — AB (ref 0.0–7.0)

## 2022-09-14 MED ORDER — LOSARTAN POTASSIUM 100 MG PO TABS: 100.0000 mg | ORAL_TABLET | Freq: Every day | ORAL | 3 refills | Status: DC

## 2022-09-14 MED ORDER — CARVEDILOL 25 MG PO TABS: 25.0000 mg | ORAL_TABLET | Freq: Two times a day (BID) | ORAL | 3 refills | Status: DC

## 2022-09-14 MED ORDER — ALBUTEROL SULFATE HFA 108 (90 BASE) MCG/ACT IN AERS: 2.0000 | INHALATION_SPRAY | Freq: Four times a day (QID) | RESPIRATORY_TRACT | 5 refills | Status: DC | PRN

## 2022-09-14 MED ORDER — JARDIANCE 10 MG PO TABS: 10.0000 mg | ORAL_TABLET | Freq: Every day | ORAL | 3 refills | Status: DC

## 2022-09-14 MED ORDER — FLUTICASONE FUROATE-VILANTEROL 200-25 MCG/ACT IN AEPB: 1.0000 | INHALATION_SPRAY | Freq: Every day | RESPIRATORY_TRACT | 3 refills | Status: DC

## 2022-09-14 MED ORDER — CHLORTHALIDONE 25 MG PO TABS: 25.0000 mg | ORAL_TABLET | Freq: Every day | ORAL | 3 refills | Status: DC

## 2022-09-14 NOTE — Patient Instructions (Signed)
It was great seeing you today!  Today we did your physical and I am glad to see you are doing well!  Your A1c went down to 7.3, continue your current medications and watching your diet, incorporating more vegetables.     I have also completed your handicap placard and refilled your medi  You can get your COVID and RSV vaccines at the pharmacy   I will see you back in 3 months for your next diabetes check, but if you need to be seen earlier than that for any new issues we're happy to fit you in, just give [place] a call!  Feel free to call with any questions or concerns at any time, at [phone].   Take care,  Dr. Shary Key [facility]

## 2022-09-14 NOTE — Progress Notes (Unsigned)
    SUBJECTIVE:   Chief compliant/HPI: annual examination  [name] is a 71 y.o. who presents today for an annual exam.   DM2  A1c 7.3 down from 8.2 three months  Needs glucometer and strips. Has not been checking sugars lately  Had eye exam in April   HTN Did not bring log but states at home BP are typically 130s/80s   Requesting refill of albulterol inhaler and Breo Elipta.   Denies tobacco use, alcohol use, recreational drug use Exercises 30 minutes weekly   Needs handicap placard renewal completed    OBJECTIVE:   BP (!) 148/73   Pulse 92   Wt 229 lb 3.2 oz (104 kg)   SpO2 93%   BMI 41.92 kg/m   Physical exam General: well appearing, NAD Cardiovascular: RRR, no murmurs Lungs: CTAB. Normal WOB Abdomen: soft, non-distended, non-tender Skin: warm, dry. No edema   ASSESSMENT/PLAN:   No problem-specific Assessment & Plan notes found for this encounter.    Annual Examination  See AVS for age appropriate recommendations  PHQ score 2, reviewed and discussed.  BP reviewed and elevated  Considered the following items based upon USPSTF recommendations: Diabetes screening: ordered A1c 7.3 down from 8.2 in August. Will continue current regimen Screening for elevated cholesterol:  not ordered but should get at follow up Osteoporosis screening considered based upon risk of fracture from [facility] calculator but low risk Reviewed risk factors for latent tuberculosis and not indicated  Cervical cancer screening:  up to date  Breast cancer screening:  up to date Vaccines: out of COVID vaccine, recommended getting it at pharmacy  Refilled medications   Follow up in 3 months or sooner if indicated.    [place]

## 2022-09-15 MED ORDER — BLOOD GLUCOSE MONITOR KIT: PACK | 0 refills | Status: AC

## 2022-11-22 IMAGING — CT CT HEAD W/O CM
3 series · 12 of 33 positions shown, 14 images · non-contrast
Comparison: None Available.

CLINICAL DATA: Altered mental status



[Series 3: coronal · axial · 0.45mm/px · z∈[-103,-3]mm · 4 of 30 slices shown, 5 images (1 of 2)]
[im 5/30  soft-tissue]
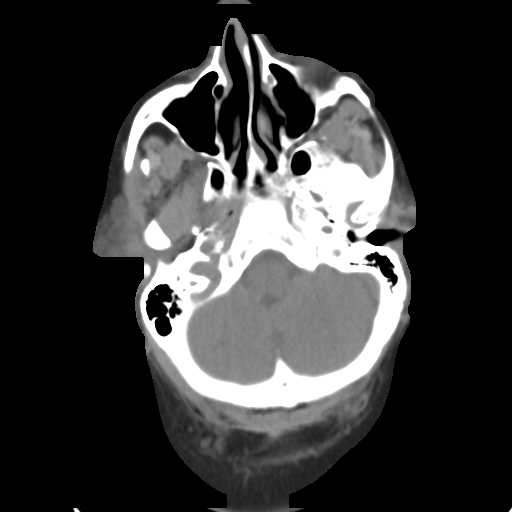
[im 5/30  bone]
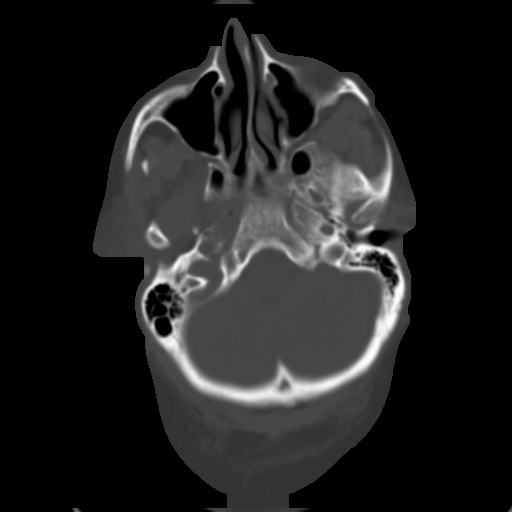
[im 12/30  bone]
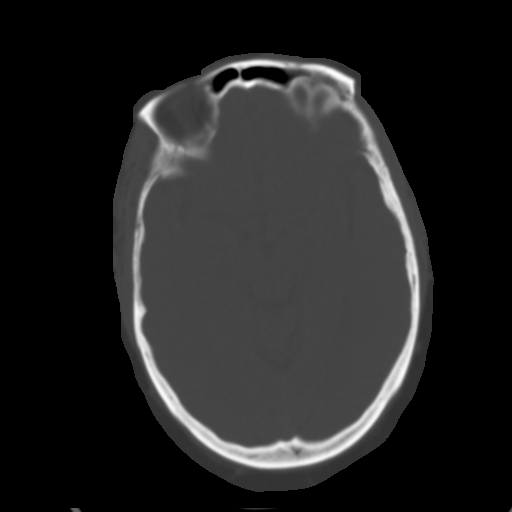
[im 18/30  bone]
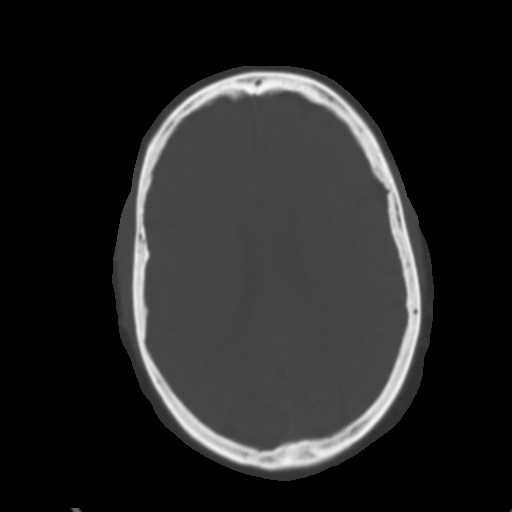
[im 25/30  bone]
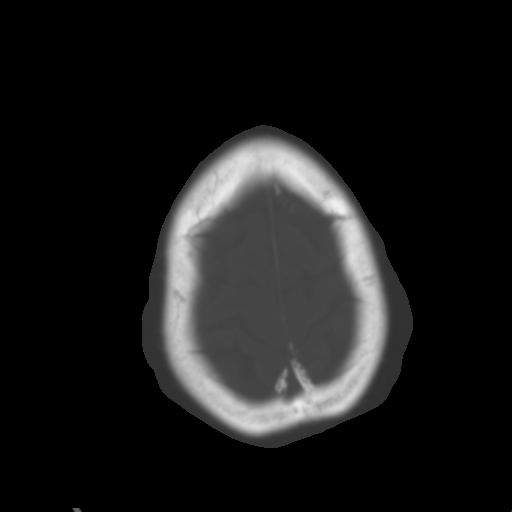

[Series 5: coronal · coronal · 0.29mm/px · 3 of 41 slices shown (2 of 2)]
[im 9/41  bone]
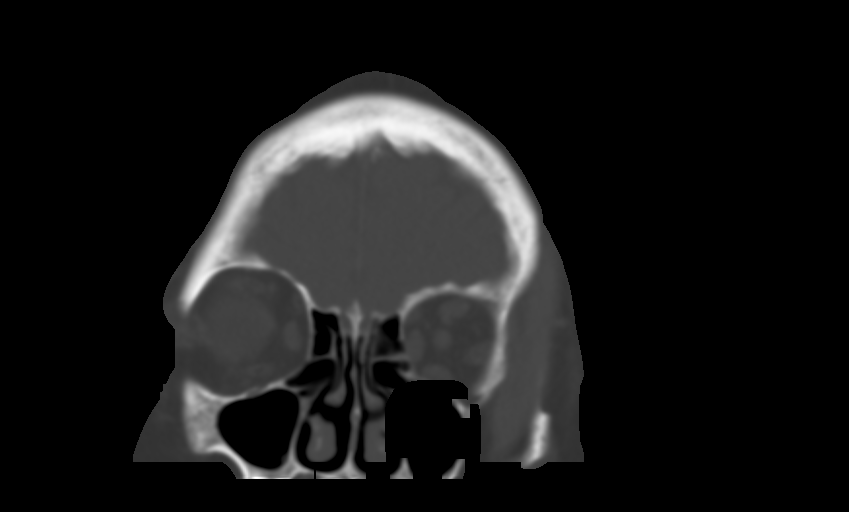
[im 17/41  bone]
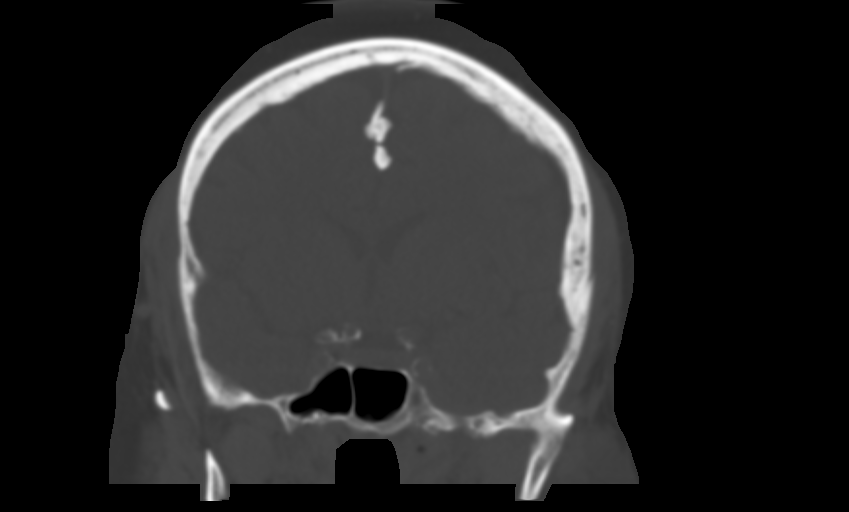
[im 25/41  bone]
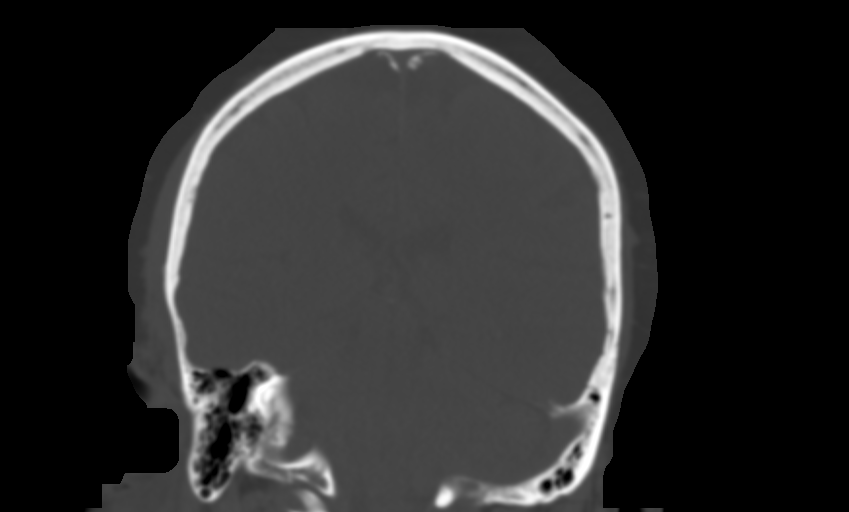

[Series 6: sagittal · sagittal · 0.29mm/px · 5 of 41 slices shown, 6 images]
[im 14/41  bone]
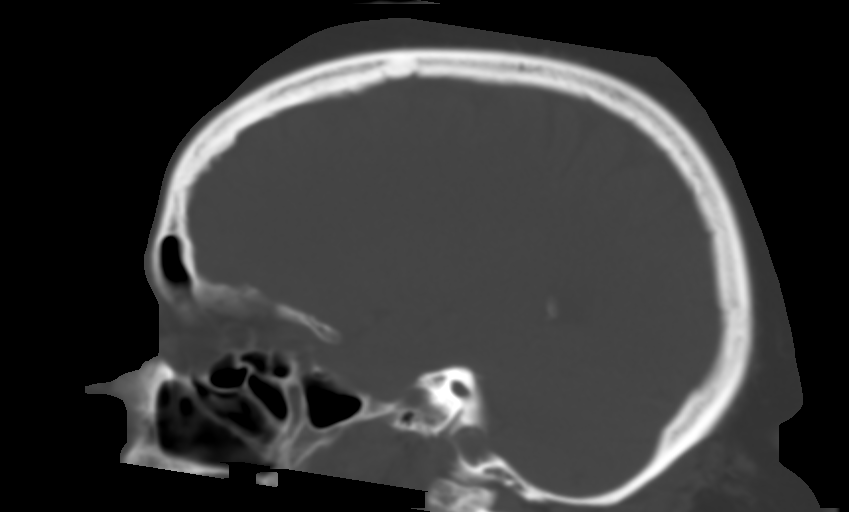
[im 17/41  bone]
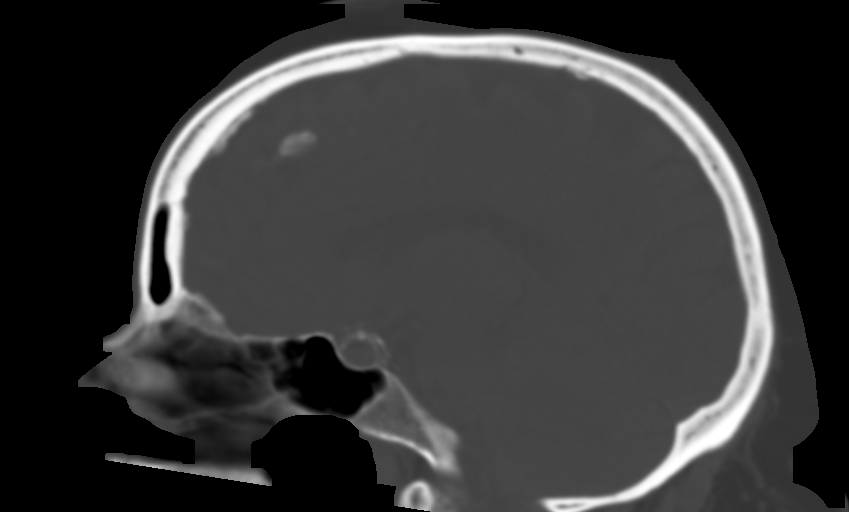
[im 21/41  soft-tissue]
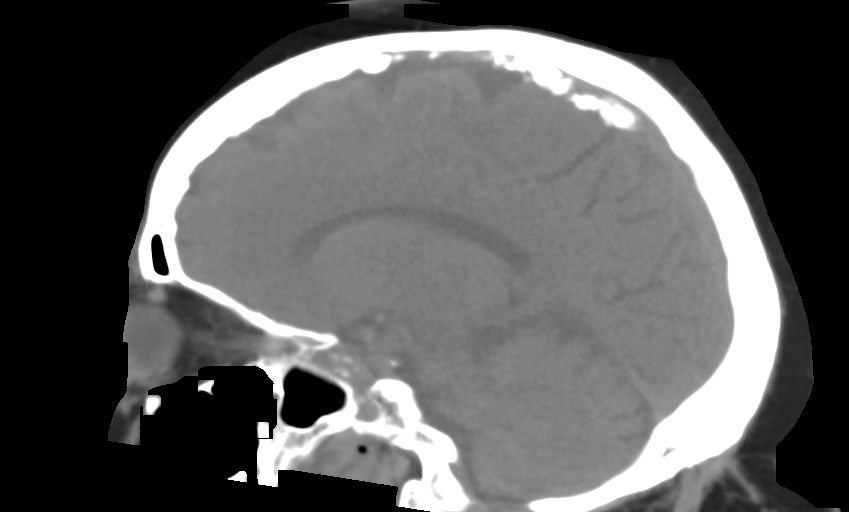
[im 21/41  bone]
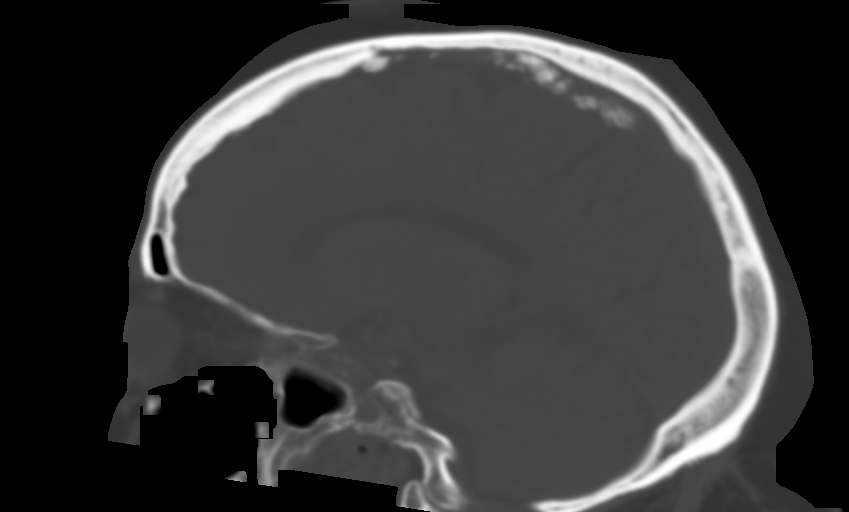
[im 24/41  bone]
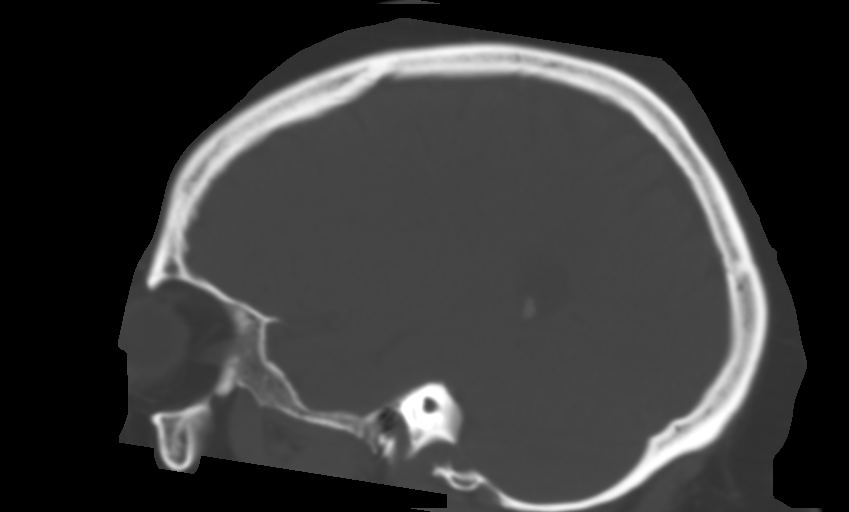
[im 27/41  bone]
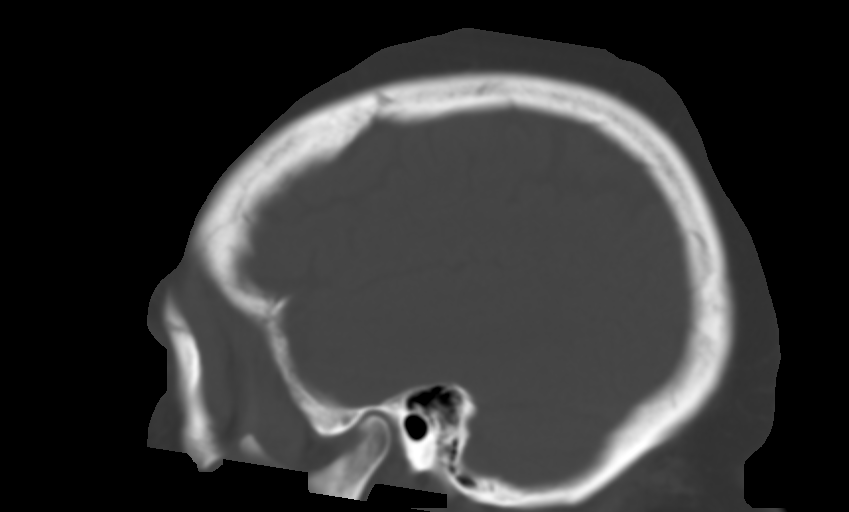

[12 of 33 positions shown; findings below may reference images not displayed]

FINDINGS: Brain: No evidence of acute infarction, hemorrhage, hydrocephalus,
extra-axial collection or mass lesion/mass effect.

Vascular: No hyperdense vessel or unexpected calcification.

Skull: Normal. Negative for fracture or focal lesion.

Sinuses/Orbits: No acute finding.

Other: None.
IMPRESSION: No acute intracranial pathology.

## 2023-01-13 ENCOUNTER — Other Ambulatory Visit: Payer: Self-pay | Admitting: Family Medicine

## 2023-01-13 DIAGNOSIS — Z1231 Encounter for screening mammogram for malignant neoplasm of breast: Secondary | ICD-10-CM

## 2023-01-22 ENCOUNTER — Telehealth: Payer: Self-pay | Admitting: Family Medicine

## 2023-01-22 NOTE — Telephone Encounter (Signed)
patient dropped off form at front desk for [facility].  Verified that patient section of form has been completed.  Last DOS/WCC with PCP was 09/14/22.  Placed form in re team folder to be completed by clinical staff.  [name]

## 2023-01-25 NOTE — Telephone Encounter (Signed)
Please complete form that patient has dropped off, I will place it in you box, thank you.

## 2023-01-26 ENCOUNTER — Encounter: Payer: Self-pay | Admitting: Physician Assistant

## 2023-01-28 NOTE — Telephone Encounter (Signed)
Form faxed to [facility].   Copy made for batch scanning.

## 2023-02-03 ENCOUNTER — Telehealth: Payer: Self-pay | Admitting: Family Medicine

## 2023-02-03 NOTE — Telephone Encounter (Signed)
Called patient to schedule Medicare Annual Wellness Visit (AWV). Left message for patient to call back and schedule Medicare Annual Wellness Visit (AWV).  Last date of AWV: 03/04/2021   Please schedule an AWVS appointment at any time with [facility]ISIT .  If any questions, please contact me at [phone].    Thank you,  [facility] Direct dial  [phone]

## 2023-02-18 ENCOUNTER — Encounter: Payer: Self-pay | Admitting: Physician Assistant

## 2023-02-25 ENCOUNTER — Ambulatory Visit
Admission: RE | Admit: 2023-02-25 | Discharge: 2023-02-25 | Disposition: A | Discharge status: Home / Self Care | Payer: Medicare HMO | Source: Ambulatory Visit | Attending: Family Medicine | Admitting: Family Medicine

## 2023-02-25 DIAGNOSIS — Z1231 Encounter for screening mammogram for malignant neoplasm of breast: Secondary | ICD-10-CM

## 2023-06-28 ENCOUNTER — Telehealth: Payer: Self-pay | Admitting: Pharmacist

## 2023-06-28 NOTE — Telephone Encounter (Signed)
Attempted to contact patient for follow-up of blood pressure/hypertension control.   Left HIPAA compliant voice mail requesting call back to direct phone: [phone] for scheduling blood pressure visit with clinic pharmacist.   Total time with patient call and documentation of interaction: 4 minutes.

## 2023-08-01 NOTE — Progress Notes (Deleted)
    SUBJECTIVE:   Chief compliant/HPI: annual examination  [name] is a 72 y.o. who presents today for an annual exam.    History tabs reviewed and updated ***.   Review of systems form reviewed and notable for ***.   OBJECTIVE:   There were no vitals taken for this visit.  ***  ASSESSMENT/PLAN:   No problem-specific Assessment & Plan notes found for this encounter.    Annual Examination  See AVS for age appropriate recommendations  PHQ score ***, reviewed and discussed.  BP reviewed and at goal ***.  Asked about intimate partner violence and resources given as appropriate  Advance directives discussion ***  Considered the following items based upon USPSTF recommendations: Diabetes screening: discussed and ordered Screening for elevated cholesterol: discussed and ordered Osteoporosis screening considered based upon risk of fracture from [facility] calculator. Major osteoporotic fracture risk is 8.5%. DEXA not ordered.  Reviewed risk factors for latent tuberculosis and {not indicated/requested/declined:14582}  Discussed family history, BRCA testing {not indicated/requested/declined:14582}. Tool used to risk stratify was ***.  Breast cancer screening: {mammoscreen:23820} Colorectal cancer screening: {crcscreen:23821::"discussed, colonoscopy ordered"} Lung cancer screening: {discussed/declined/written ZOXW:96045}. See documentation below regarding indications/risks/benefits.  Vaccinations ***.   Follow up in 1 *** year or sooner if indicated.    Bess Kinds, MD [facility]

## 2023-08-02 ENCOUNTER — Ambulatory Visit: Payer: Medicare HMO | Admitting: Student

## 2023-08-04 NOTE — Progress Notes (Addendum)
SUBJECTIVE:   Chief compliant/HPI: annual examination  [name] is a 72 y.o. who presents today for an annual exam.   Breathing / Energy / Sleep Having some SOB w/ exertion, goes away with rest. No cough, but appreciates some wheezing at times. Was seen by pulmonology in the past and told she had allergic asthma. She feels this is the same issues she's had before, and request a refill of albuterol. No fevers, or systemic symptoms and no swelling developing anywhere. No difficulty breathing when laying down.  Patient also having issues with energy, and sleep.  2/2 time, patient will follow up at following appointment.    DM No longer taking Jardiance because of the cost. Checks sugars sporadically in the morning, usually around low 100's.   HTN Taking BP meds every day.  BP Readings from Last 3 Encounters:  08/05/23 (!) 156/98  09/14/22 (!) 148/73  06/19/22 (!) 124/57    OBJECTIVE:   BP (!) 156/98   Pulse 79   Ht 5\' 2"  (1.575 m)   Wt 225 lb 9.6 oz (102.3 kg)   SpO2 99%   BMI 41.26 kg/m   Physical Exam Constitutional:      General: She is not in acute distress.    Appearance: Normal appearance. She is not ill-appearing.  Cardiovascular:     Rate and Rhythm: Normal rate and regular rhythm.     Pulses: Normal pulses.     Heart sounds: Normal heart sounds. No murmur heard.    No friction rub. No gallop.  Pulmonary:     Effort: Pulmonary effort is normal. No respiratory distress.     Breath sounds: Normal breath sounds. No stridor. No wheezing, rhonchi or rales.  Abdominal:     General: Bowel sounds are normal. There is no distension.     Palpations: Abdomen is soft. There is no mass.     Tenderness: There is no abdominal tenderness. There is no guarding.     Hernia: No hernia is present.  Skin:    Capillary Refill: Capillary refill takes less than 2 seconds.  Neurological:     Mental Status: She is alert.  Psychiatric:        Mood and Affect: Mood normal.         Behavior: Behavior normal.      ASSESSMENT/PLAN:   Diabetes mellitus (HCC) Comes in for follow-up of her diabetes.  Patient notes she is no longer taking Jardiance, secondary to cost.  Patient A1c 6.8.  Patient wanting to start semaglutide, denies any personal, or family history of thyroid cancer or MEN.  Will start for weight loss and, continued control of diabetes. - Semaglutide 0.25 mg weekly - Follow-up 1 month  Hypertension associated with diabetes (HCC) BP noted to be elevated today, and at last visit, patient hypertension poorly controlled.  Will increase chlorthalidone.  Patient reports good compliance with medication. - Chlorthalidone 50 mg daily - BMP - Follow-up 2 weeks  Dyspnea on exertion Patient comes in with complaint of dyspnea on exertion, that goes away with rest.  Patient denies any cough, and has been seen by pulmonology for this in the past, was told she has allergic asthma.  Patient requesting refill of albuterol medicine.  Patient denies any fevers or systemic symptoms.  Patient denies any difficulty when laying down, and no swelling in extremities, and appreciates some intermittent wheezing.  Patient lung exam benign, extremities without edema, SpO2 appropriate, will trial albuterol, to see if improvement  in breathing symptoms.  Secondary to time patient to follow-up at next appointment. - Refill albuterol    Annual Examination  See AVS for age appropriate recommendations  PHQ not completed, but mood is good and not down or depressed BP reviewed and not at goal.  Asked about intimate partner violence and resources given as appropriate   Considered the following items based upon USPSTF recommendations: Diabetes screening: discussed and ordered Screening for elevated cholesterol: discussed and ordered Hepatitis C:     Neg 02/20/20 Hepatitis B: discussed and ordered Osteoporosis screening considered based upon risk of fracture from [facility] calculator. Major  osteoporotic fracture risk is 8.7%. DEXA not ordered.  Reviewed risk factors for latent tuberculosis and not indicated   Discussed family history, BRCA testing not indicated. Tool used to risk stratify was Patient had hx of Breast Cx.  Breast cancer screening:  UTD Colorectal cancer screening: discussed and declined today.  Lung cancer screening: discussed, declined, and Never smoked . See documentation below regarding indications/risks/benefits.   Follow up in 1 year or sooner if indicated.    Bess Kinds, MD [facility]

## 2023-08-04 NOTE — Patient Instructions (Addendum)
It was great to see you! Thank you for allowing me to participate in your care!  I recommend that you always bring your medications to each appointment as this makes it easy to ensure we are on the correct medications and helps [place] not miss when refills are needed.  Our plans for today:  - Diabetes Your A1c is 6.8, it is at our goal for you. You do not need to take the jardiance at this time.  Follow up in 1 months for a recheck.  Make appointment to discuss starting ozempic with Dr. Madelon Lips  -High Blood Pressure  We are increasing the dose of your Chlorthalidone   Chlorthalidone 50 mg daily   F/u 2 weeks  - Check up  Checking your kidney function, and cholesterol, and Hepatitis C   I will reach out to you about your colon cancer screening  - Energy Issues, and Sleep  Make follow up appointment to discuss these, at your convenience    We are checking some labs today, I will call you if they are abnormal will send you a MyChart message or a letter if they are normal.  If you do not hear about your labs in the next 2 weeks please let us know.  Take care and seek immediate care sooner if you develop any concerns.   Dr. Bess Kinds, MD [facility]

## 2023-08-05 ENCOUNTER — Ambulatory Visit: Payer: Medicare HMO | Admitting: Student

## 2023-08-05 ENCOUNTER — Encounter: Payer: Self-pay | Admitting: Student

## 2023-08-05 VITALS — BP 156/98 | HR 79 | Ht 62.0 in | Wt 225.6 lb

## 2023-08-05 DIAGNOSIS — Z1159 Encounter for screening for other viral diseases: Secondary | ICD-10-CM | POA: Diagnosis not present

## 2023-08-05 DIAGNOSIS — Z23 Encounter for immunization: Secondary | ICD-10-CM

## 2023-08-05 DIAGNOSIS — E1159 Type 2 diabetes mellitus with other circulatory complications: Secondary | ICD-10-CM

## 2023-08-05 DIAGNOSIS — E1169 Type 2 diabetes mellitus with other specified complication: Secondary | ICD-10-CM

## 2023-08-05 DIAGNOSIS — Z6841 Body Mass Index (BMI) 40.0 and over, adult: Secondary | ICD-10-CM | POA: Diagnosis not present

## 2023-08-05 DIAGNOSIS — Z0000 Encounter for general adult medical examination without abnormal findings: Secondary | ICD-10-CM | POA: Diagnosis not present

## 2023-08-05 DIAGNOSIS — R0609 Other forms of dyspnea: Secondary | ICD-10-CM | POA: Diagnosis not present

## 2023-08-05 DIAGNOSIS — I152 Hypertension secondary to endocrine disorders: Secondary | ICD-10-CM

## 2023-08-05 LAB — POCT GLYCOSYLATED HEMOGLOBIN (HGB A1C): HbA1c, POC (controlled diabetic range): 6.8 % (ref 0.0–7.0)

## 2023-08-05 MED ORDER — CHLORTHALIDONE 50 MG PO TABS: 25.0000 mg | ORAL_TABLET | Freq: Every day | ORAL | 1 refills | Status: DC

## 2023-08-06 LAB — LIPID PANEL
Chol/HDL Ratio: 4.2 ratio (ref 0.0–4.4)
Cholesterol, Total: 217 mg/dL — ABNORMAL HIGH (ref 100–199)
HDL: 52 mg/dL (ref >39)
LDL Chol Calc (NIH): 144 mg/dL — ABNORMAL HIGH (ref 0–99)
Triglycerides: 120 mg/dL (ref 0–149)
VLDL Cholesterol Cal: 21 mg/dL (ref 5–40)

## 2023-08-06 LAB — BASIC METABOLIC PANEL
BUN/Creatinine Ratio: 16 (ref 12–28)
BUN: 26 mg/dL (ref 8–27)
CO2: 26 mmol/L (ref 20–29)
Calcium: 9.3 mg/dL (ref 8.7–10.3)
Chloride: 104 mmol/L (ref 96–106)
Creatinine, Ser: 1.58 mg/dL — ABNORMAL HIGH (ref 0.57–1.00)
Glucose: 139 mg/dL — ABNORMAL HIGH (ref 70–99)
Potassium: 4.3 mmol/L (ref 3.5–5.2)
Sodium: 143 mmol/L (ref 134–144)
eGFR: 34 mL/min/{1.73_m2} — ABNORMAL LOW (ref >59)

## 2023-08-06 LAB — HEPATITIS B SURFACE ANTIGEN: Hepatitis B Surface Ag: NEGATIVE

## 2023-08-07 NOTE — Assessment & Plan Note (Signed)
BP noted to be elevated today, and at last visit, patient hypertension poorly controlled.  Will increase chlorthalidone.  Patient reports good compliance with medication. - Chlorthalidone 50 mg daily - BMP - Follow-up 2 weeks

## 2023-08-07 NOTE — Assessment & Plan Note (Signed)
Comes in for follow-up of her diabetes.  Patient notes she is no longer taking Jardiance, secondary to cost.  Patient A1c 6.8.  Patient wanting to start semaglutide, denies any personal, or family history of thyroid cancer or MEN.  Will start for weight loss and, continued control of diabetes. - Semaglutide 0.25 mg weekly - Follow-up 1 month

## 2023-08-09 ENCOUNTER — Telehealth: Payer: Self-pay

## 2023-08-09 NOTE — Telephone Encounter (Signed)
Patient calls nurse line for issues with medication management.   She reports that she needs refills on Breo Ellipta inhaler, albuterol inhaler and losartan.   She also states that she was supposed to be getting started on Ozempic. I am unable to see where this was sent to the pharmacy. Patient states that she is supposed to schedule an appointment with Dr. Raymondo Band after getting this medication for education on administration. Advised patient that Ozempic requires prior authorization and that we can schedule follow up with Dr. Raymondo Band after medication is picked up from the pharmacy.   Please send rx to [facility] on [address].   I have pended all the other medications to this encounter.   Will forward to PCP.   Veronda Prude, RN

## 2023-08-11 MED ORDER — FLUTICASONE FUROATE-VILANTEROL 200-25 MCG/ACT IN AEPB: 1.0000 | INHALATION_SPRAY | Freq: Every day | RESPIRATORY_TRACT | 3 refills | Status: DC

## 2023-08-11 MED ORDER — SEMAGLUTIDE(0.25 OR 0.5MG/DOS) 2 MG/3ML ~~LOC~~ SOPN: 0.2500 mg | PEN_INJECTOR | SUBCUTANEOUS | 2 refills | Status: DC

## 2023-08-11 MED ORDER — ALBUTEROL SULFATE HFA 108 (90 BASE) MCG/ACT IN AERS
2.0000 | INHALATION_SPRAY | Freq: Four times a day (QID) | RESPIRATORY_TRACT | 5 refills | Status: DC | PRN
Start: 2023-08-11 — End: 2024-07-13

## 2023-08-11 MED ORDER — LOSARTAN POTASSIUM 100 MG PO TABS: 100.0000 mg | ORAL_TABLET | Freq: Every day | ORAL | 3 refills | Status: DC

## 2023-08-13 NOTE — Assessment & Plan Note (Addendum)
Patient comes in with complaint of dyspnea on exertion, that goes away with rest.  Patient denies any cough, and has been seen by pulmonology for this in the past, was told she has allergic asthma.  Patient requesting refill of albuterol medicine.  Patient denies any fevers or systemic symptoms.  Patient denies any difficulty when laying down, and no swelling in extremities, and appreciates some intermittent wheezing.  Patient lung exam benign, extremities without edema, SpO2 appropriate, will trial albuterol, to see if improvement in breathing symptoms.  Secondary to time patient to follow-up at next appointment. - Refill albuterol

## 2023-09-12 DIAGNOSIS — E119 Type 2 diabetes mellitus without complications: Secondary | ICD-10-CM | POA: Diagnosis not present

## 2023-11-26 ENCOUNTER — Other Ambulatory Visit: Payer: Self-pay

## 2023-11-30 MED ORDER — FLUTICASONE FUROATE-VILANTEROL 200-25 MCG/ACT IN AEPB
1.0000 | INHALATION_SPRAY | Freq: Every day | RESPIRATORY_TRACT | 3 refills | Status: AC
Start: 2023-11-30 — End: ?

## 2024-02-15 ENCOUNTER — Other Ambulatory Visit: Payer: Self-pay | Admitting: Student

## 2024-02-15 DIAGNOSIS — Z1231 Encounter for screening mammogram for malignant neoplasm of breast: Secondary | ICD-10-CM

## 2024-03-03 ENCOUNTER — Ambulatory Visit

## 2024-03-06 ENCOUNTER — Ambulatory Visit
Admission: RE | Admit: 2024-03-06 | Discharge: 2024-03-06 | Disposition: A | Discharge status: Home / Self Care | Source: Ambulatory Visit | Attending: Child and Adolescent Psychiatry | Admitting: Child and Adolescent Psychiatry

## 2024-03-06 DIAGNOSIS — Z1231 Encounter for screening mammogram for malignant neoplasm of breast: Secondary | ICD-10-CM | POA: Diagnosis not present

## 2024-03-07 ENCOUNTER — Encounter: Payer: Self-pay | Admitting: *Deleted

## 2024-06-10 ENCOUNTER — Ambulatory Visit (HOSPITAL_COMMUNITY)
Admission: EM | Admit: 2024-06-10 | Discharge: 2024-06-10 | Disposition: A | Discharge status: Home / Self Care | Attending: Emergency Medicine | Admitting: Emergency Medicine

## 2024-06-10 ENCOUNTER — Encounter (HOSPITAL_COMMUNITY): Payer: Self-pay

## 2024-06-10 DIAGNOSIS — M545 Low back pain, unspecified: Secondary | ICD-10-CM | POA: Diagnosis not present

## 2024-06-10 DIAGNOSIS — I10 Essential (primary) hypertension: Secondary | ICD-10-CM | POA: Diagnosis not present

## 2024-06-10 LAB — POCT URINALYSIS DIP (MANUAL ENTRY)
Bilirubin, UA: NEGATIVE
Blood, UA: NEGATIVE
Glucose, UA: NEGATIVE mg/dL
Ketones, POC UA: NEGATIVE mg/dL
Leukocytes, UA: NEGATIVE
Nitrite, UA: NEGATIVE
Protein Ur, POC: >=300 mg/dL — AB
Spec Grav, UA: 1.025 (ref 1.010–1.025)
Urobilinogen, UA: 1 U/dL
pH, UA: 5.5 (ref 5.0–8.0)

## 2024-06-10 MED ORDER — DICLOFENAC SODIUM 1 % EX GEL: 4.0000 g | Freq: Four times a day (QID) | CUTANEOUS | 0 refills | Status: AC

## 2024-06-10 MED ORDER — DEXAMETHASONE 10 MG/ML FOR PEDIATRIC ORAL USE
INTRAMUSCULAR | Status: AC
  Filled 2024-06-10: qty 1

## 2024-06-10 MED ORDER — DEXAMETHASONE SODIUM PHOSPHATE 10 MG/ML IJ SOLN
10.0000 mg | Freq: Once | INTRAMUSCULAR | Status: AC
Start: 2024-06-10 — End: 2024-06-10
  Administered 2024-06-10: 10 mg via INTRAMUSCULAR

## 2024-06-10 NOTE — Discharge Instructions (Signed)
 We have given you a steroid shot to help with your low back pain.  You can use topical Voltaren  gel to the area as well as heat and gentle stretching.  Your blood pressure was quite elevated today in clinic.  Please take your blood pressure medications as prescribed.  Seek immediate care at the nearest emergency department for any vision loss, severe headache, or new concerning symptoms, as these could be signs of a stroke.  Follow-up with your primary care provider for any continued concerns.

## 2024-06-10 NOTE — ED Provider Notes (Signed)
 [facility]    CSN: [id number] Arrival date & time: 06/10/24  1413      History   Chief Complaint Chief Complaint  Patient presents with   Back Pain    HPI [name] is a 73 y.o. female.   Patient presents to clinic over concern of right sided low back pain that has been present for the past week but has been gradually getting worse.  At home patient has been using ice, heat and Voltaren  gel with some relief.  Rest helps.  Going from sitting to standing and changing positions will trigger the pain.  Has not had any recent trauma, falls or injuries.  Has not had any numbness, tingling or radiation of the pain.  Denies urinary symptoms such as urgency or frequency.  Did not take her blood pressure medications today, reports she was running around and busy.  Does normally take them daily.  Is not having any vision changes or headache.  The history is provided by the patient and medical records.  Back Pain   Past Medical History:  Diagnosis Date   Abnormal electrocardiogram (ECG) (EKG)    Anemia    Arthritis    Asthma    Back pain    Bilateral swelling of feet    Blood transfusion 03/14/2012   Bradycardia    Breast cancer (HCC) 03/16/2012   right lumpectomy=high grade ductal ca in situ,ER/PR=positive   Breast mass, left 06/27/2020   Screening Mammogram 06/25/20:  FINDINGS: In the left breast, a possible mass warrants further evaluation. In the right breast, no findings suspicious for malignancy   Diagnostic mammogram +/- ultrasound (06/2020) Targeted ultrasound of the inner left breast was performed. No suspicious masses or abnormality seen, only multiple mildly dilated ducts identified compatible with benign ductal ectasia.     Cancer [facility])    right breast ductal carcinoma in situ   Chronic kidney disease    stage III    Diabetes mellitus    type 2   Diplopia 11/07/2014   Ductal carcinoma in situ (DCIS) of right breast 04/12/2017   History of ER+ PR+ Ductal  carcinoma in situ 2013, lumpectomy and radiation.  Follow-up mammograms have been normal.   Last mammogram 03/2019 negative. Followed by Hematologist Dr. Sandria Gell. Finished 5 year course of Arimidex  in (06/2012-06/2017)    Dyspnea    Edema    GERD (gastroesophageal reflux disease)    Hiatal hernia    large   History of breast cancer 05/18/2014   Hypercholesterolemia    Hypertension    Echocardiogram 03/2011: EF 55-60%, grade 1 diastolic dysfunction, mild MAC, mild LAE   Iron  deficiency anemia    Joint pain    Morbid obesity (HCC)    Other fatigue    Personal history of radiation therapy    S/P radiation therapy 05/16/12 - 06/29/12   Right Breast: 50 Gray/ 25 Fractions with Boost: 10 Gray/ 5 Fractions   Shortness of breath    with exertion   Shortness of breath on exertion    Sinus drainage    Sleep apnea    Total knee replacement status 05/15/2014   Use of anastrozole  (Arimidex ) Started 10/13   Vitamin D  deficiency     Patient Active Problem List   Diagnosis Date Noted   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 05/22/2022   Incarcerated hiatal hernia 05/21/2022   Acute kidney injury superimposed on chronic kidney disease (HCC) 05/20/2022   Abdominal mass 05/20/2022   Obstructive sleep  apnea 01/05/2020   GERD (gastroesophageal reflux disease)    Vitamin D  deficiency 12/29/2019   Hyperlipidemia associated with type 2 diabetes mellitus (HCC) 12/29/2019   Allergic asthma, mild intermittent, uncomplicated 07/05/2018   Dyspnea on exertion 11/12/2017   Moderate osteopenia 02/21/2015   Thalassemia 02/21/2015   Allergic rhinitis 05/18/2014   Diabetes mellitus (HCC) 05/18/2014   Class 3 severe obesity with serious comorbidity and body mass index (BMI) of 45.0 to 49.9 in adult 05/15/2014   Breast cancer, right breast (HCC) 03/16/2012   Iron  deficiency anemia 05/22/2011   Hypertension associated with diabetes (HCC) 05/22/2011    Past Surgical History:  Procedure Laterality Date    Biospy Right Breast  02/01/2012   Right Breast Needle Core Biopsy: ductal Carcinoma In situ with Papillary Features, ER/PR Positive   BREAST EXCISIONAL BIOPSY     left 1998   BREAST LUMPECTOMY Right    Lumpectomy Right Breast  03/16/2012   Ductal carcinoma In situ: clear margins: 0/1 Node Negative   TOTAL KNEE ARTHROPLASTY Right 05/15/2014   Procedure: RIGHT TOTAL KNEE ARTHROPLASTY;  Surgeon: Tanda DELENA Heading, MD;  Location: [facility];  Service: Orthopedics;  Laterality: Right;   XI ROBOTIC ASSISTED HIATAL HERNIA REPAIR N/A 05/22/2022   Procedure: XI ROBOTIC ASSISTED HIATAL HERNIA REPAIR WITH FUNDOPLICATION AND MESH REINFORCEMENT;  Surgeon: Sheldon Standing, MD;  Location: [facility];  Service: General;  Laterality: N/A;    OB History     Gravida  1   Para  1   Term  1   Preterm      AB      Living         SAB      IAB      Ectopic      Multiple      Live Births           Obstetric Comments  Menarche age 45, Parity age 88, G40,P1, Last cycle age 10          Home Medications    Prior to Admission medications   Medication Sig Start Date End Date Taking? Authorizing Provider  diclofenac  Sodium (VOLTAREN ) 1 % GEL Apply 4 g topically 4 (four) times daily. 06/10/24  Yes Christinna Sprung  N, FNP  albuterol  (PROAIR  HFA) 108 (90 Base) MCG/ACT inhaler Inhale 2 puffs into the lungs every 6 (six) hours as needed for wheezing or shortness of breath. 08/11/23   Jennelle Riis, MD  blood glucose meter kit and supplies KIT Dispense based on patient and insurance preference. Use up to four times daily as directed. 09/15/22   Paige, Victoria J, DO  carvedilol  (COREG ) 25 MG tablet Take 1 tablet (25 mg total) by mouth 2 (two) times daily with a meal. 09/14/22   Paige, Victoria J, DO  chlorthalidone  (HYGROTON ) 50 MG tablet Take 0.5 tablets (25 mg total) by mouth daily. 08/05/23   Sowell, Brandon, MD  Cholecalciferol (VITAMIN D ) 50 MCG (2000 UT) tablet Take 2,000 Units by mouth daily.     [provider]  fluticasone  furoate-vilanterol (BREO ELLIPTA ) 200-25 MCG/ACT AEPB Inhale 1 puff into the lungs daily. 11/30/23   Sowell, Brandon, MD  glucose blood (GNP TRUE METRIX GLUCOSE STRIPS) test strip Use as instructed 04/21/21   Paige, Victoria J, DO  JARDIANCE  10 MG TABS tablet Take 1 tablet (10 mg total) by mouth daily. Patient not taking: Reported on 06/10/2024 09/14/22   Paige, Victoria J, DO  losartan  (COZAAR ) 100 MG tablet Take 1 tablet (100  mg total) by mouth daily. 08/11/23   Jennelle Riis, MD  omeprazole (PRILOSEC) 40 MG capsule Take 40 mg by mouth 2 (two) times daily. 03/09/22   [provider]    Family History Family History  Problem Relation Age of Onset   Hypertension Mother    Diabetes Mother    Heart attack Father 36   Heart disease Father    Sudden death Father    Breast cancer Sister 60   Lymphoma Sister        Deceased at age 79    Social History Social History   Tobacco Use   Smoking status: Never   Smokeless tobacco: Never  Vaping Use   Vaping status: Never Used  Substance Use Topics   Alcohol use: No   Drug use: No     Allergies   Nsaids and Diprivan  [propofol ]   Review of Systems Review of Systems  Per HPI  Physical Exam Triage Vital Signs ED Triage Vitals  Encounter Vitals Group     BP 06/10/24 1612 (!) 199/135     Girls Systolic BP Percentile --      Girls Diastolic BP Percentile --      Boys Systolic BP Percentile --      Boys Diastolic BP Percentile --      Pulse Rate 06/10/24 1612 84     Resp 06/10/24 1612 16     Temp 06/10/24 1612 98 F (36.7 C)     Temp Source 06/10/24 1612 Oral     SpO2 06/10/24 1612 93 %     Weight --      Height --      Head Circumference --      Peak Flow --      Pain Score 06/10/24 1613 7     Pain Loc --      Pain Education --      Exclude from Growth Chart --    No data found.  Updated Vital Signs BP (!) 199/135 (BP Location: Right Arm)   Pulse 84   Temp 98 F (36.7  C) (Oral)   Resp 16   SpO2 93%   Visual Acuity Right Eye Distance:   Left Eye Distance:   Bilateral Distance:    Right Eye Near:   Left Eye Near:    Bilateral Near:     Physical Exam   UC Treatments / Results  Labs (all labs ordered are listed, but only abnormal results are displayed) Labs Reviewed  POCT URINALYSIS DIP (MANUAL ENTRY) - Abnormal; Notable for the following components:      Result Value   Protein Ur, POC >=300 (*)    All other components within normal limits    EKG   Radiology No results found.  Procedures Procedures (including critical care time)  Medications Ordered in UC Medications  dexamethasone  (DECADRON ) injection 10 mg (has no administration in time range)    Initial Impression / Assessment and Plan / UC Course  I have reviewed the triage vital signs and the nursing notes.  Pertinent labs & imaging results that were available during my care of the patient were reviewed by me and considered in my medical decision making (see chart for details).  Vitals and triage reviewed, patient is hemodynamically stable.  Hypertensive, asymptomatic.  Low concern for emergency without headache or vision changes.  Right-sided low back pain appears to be muscular in nature, reproducible with movement.  Atraumatic, imaging deferred.  Without urinary symptoms, UA unremarkable,  low concern for UTI.  Pain does not radiate, not consistent with sciatica.  Will treat with one-time dose of IM steroid here in clinic for inflammation.  Not a good candidate for oral NSAIDs.  History of type 2 diabetes, most recent A1c on file is well within normal limits.  Plan of care, follow-up care and strict emergency precautions given if symptoms evolve, no questions at this time.      Final Clinical Impressions(s) / UC Diagnoses   Final diagnoses:  Acute right-sided low back pain without sciatica  Essential hypertension     Discharge Instructions      We have given  you a steroid shot to help with your low back pain.  You can use topical Voltaren  gel to the area as well as heat and gentle stretching.  Your blood pressure was quite elevated today in clinic.  Please take your blood pressure medications as prescribed.  Seek immediate care at the nearest emergency department for any vision loss, severe headache, or new concerning symptoms, as these could be signs of a stroke.  Follow-up with your primary care provider for any continued concerns.      ED Prescriptions     Medication Sig Dispense Auth. Provider   diclofenac  Sodium (VOLTAREN ) 1 % GEL Apply 4 g topically 4 (four) times daily. 120 g Dreama, Novali Vollman  N, FNP      PDMP not reviewed this encounter.   Dreama Consuella SAILOR, FNP 06/10/24 [phone]

## 2024-06-10 NOTE — ED Triage Notes (Signed)
 Patient here today with c/o right side low back pain X 1 week. Patient has been using ice, heat, and Voltaren  gel with some relief. Rest also helps. Going from sitting to standing and bending aggravate it. No known injury. No h/o back pain.

## 2024-06-19 ENCOUNTER — Ambulatory Visit

## 2024-07-10 ENCOUNTER — Ambulatory Visit

## 2024-07-13 ENCOUNTER — Ambulatory Visit

## 2024-07-13 VITALS — BP 155/94 | HR 85 | Ht 62.5 in | Wt 231.0 lb

## 2024-07-13 DIAGNOSIS — Z0000 Encounter for general adult medical examination without abnormal findings: Secondary | ICD-10-CM

## 2024-07-13 DIAGNOSIS — I10 Essential (primary) hypertension: Secondary | ICD-10-CM | POA: Diagnosis not present

## 2024-07-13 DIAGNOSIS — E1169 Type 2 diabetes mellitus with other specified complication: Secondary | ICD-10-CM | POA: Diagnosis not present

## 2024-07-13 DIAGNOSIS — J069 Acute upper respiratory infection, unspecified: Secondary | ICD-10-CM | POA: Diagnosis not present

## 2024-07-13 DIAGNOSIS — Z23 Encounter for immunization: Secondary | ICD-10-CM | POA: Diagnosis not present

## 2024-07-13 LAB — POCT GLYCOSYLATED HEMOGLOBIN (HGB A1C): HbA1c, POC (controlled diabetic range): 7.4 % — AB (ref 0.0–7.0)

## 2024-07-13 LAB — POC SOFIA 2 FLU + SARS ANTIGEN FIA
Influenza A, POC: POSITIVE — AB
Influenza B, POC: NEGATIVE
SARS Coronavirus 2 Ag: NEGATIVE

## 2024-07-13 MED ORDER — JARDIANCE 10 MG PO TABS: 10.0000 mg | ORAL_TABLET | Freq: Every day | ORAL | 3 refills | Status: DC

## 2024-07-13 MED ORDER — CHLORTHALIDONE 50 MG PO TABS: 25.0000 mg | ORAL_TABLET | Freq: Every day | ORAL | 1 refills | Status: DC

## 2024-07-13 MED ORDER — OSELTAMIVIR PHOSPHATE 75 MG PO CAPS: 75.0000 mg | ORAL_CAPSULE | Freq: Two times a day (BID) | ORAL | 0 refills | Status: DC

## 2024-07-13 MED ORDER — ALBUTEROL SULFATE HFA 108 (90 BASE) MCG/ACT IN AERS: 2.0000 | INHALATION_SPRAY | Freq: Four times a day (QID) | RESPIRATORY_TRACT | 5 refills | Status: DC | PRN
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
## 2024-07-13 NOTE — Patient Instructions (Addendum)
 It was wonderful to see you today.  Please bring ALL of your medications with you to every visit.   Today we talked about:  Your annual visit. I will MyChart you with your lab results. We may need you to set up another appointment with your nephrologist. See me back on Thursday Nov 20, at 3:15pm.   Thank you for choosing [facility].   Please call [phone] with any questions about today's appointment.  Please arrive at least 15 minutes prior to your scheduled appointments.   If you had blood work today, I will send you a MyChart message or a letter if results are normal. Otherwise, I will give you a call.   If you had a referral placed, they will call you to set up an appointment. Please give us  a call if you don't hear back in the next 2 weeks.   If you need additional refills before your next appointment, please call your pharmacy first.   You should follow up in our clinic in 1 month.   Camie Dixons, DO Family Medicine

## 2024-07-14 ENCOUNTER — Ambulatory Visit: Payer: Self-pay

## 2024-07-14 ENCOUNTER — Other Ambulatory Visit: Payer: Self-pay

## 2024-07-14 DIAGNOSIS — I10 Essential (primary) hypertension: Secondary | ICD-10-CM

## 2024-07-14 MED ORDER — ROSUVASTATIN CALCIUM 5 MG PO TABS: 5.0000 mg | ORAL_TABLET | Freq: Every day | ORAL | 3 refills | Status: AC

## 2024-07-14 MED ORDER — JARDIANCE 10 MG PO TABS: 10.0000 mg | ORAL_TABLET | Freq: Every day | ORAL | 3 refills | Status: AC

## 2024-07-14 MED ORDER — LOSARTAN POTASSIUM 100 MG PO TABS: 100.0000 mg | ORAL_TABLET | Freq: Every day | ORAL | 3 refills | Status: DC

## 2024-07-14 MED ORDER — OSELTAMIVIR PHOSPHATE 30 MG PO CAPS: 30.0000 mg | ORAL_CAPSULE | Freq: Two times a day (BID) | ORAL | 0 refills | Status: AC

## 2024-07-14 MED ORDER — CARVEDILOL 25 MG PO TABS: 25.0000 mg | ORAL_TABLET | Freq: Two times a day (BID) | ORAL | 3 refills | Status: AC

## 2024-07-14 MED ORDER — ALBUTEROL SULFATE HFA 108 (90 BASE) MCG/ACT IN AERS: 2.0000 | INHALATION_SPRAY | Freq: Four times a day (QID) | RESPIRATORY_TRACT | 5 refills | Status: AC | PRN

## 2024-07-14 MED ORDER — CHLORTHALIDONE 50 MG PO TABS: 25.0000 mg | ORAL_TABLET | Freq: Every day | ORAL | 1 refills | Status: AC

## 2024-07-14 NOTE — Assessment & Plan Note (Signed)
 A1c 7.4 today, up from 6.8 1 year ago. Patient admits she has not been watching her diet.  - Microalbumin ordered - Continue Jardiance  10mg  daily  - Discussed diabetic diet and exercise - Keep diary of blood sugars at home  - Consider restarting metformin  at next visit

## 2024-07-14 NOTE — Telephone Encounter (Signed)
 Error

## 2024-07-15 LAB — BASIC METABOLIC PANEL WITH GFR
BUN/Creatinine Ratio: 15 (ref 12–28)
BUN: 23 mg/dL (ref 8–27)
CO2: 23 mmol/L (ref 20–29)
Calcium: 9.1 mg/dL (ref 8.7–10.3)
Chloride: 105 mmol/L (ref 96–106)
Creatinine, Ser: 1.57 mg/dL — ABNORMAL HIGH (ref 0.57–1.00)
Glucose: 87 mg/dL (ref 70–99)
Potassium: 4.3 mmol/L (ref 3.5–5.2)
Sodium: 141 mmol/L (ref 134–144)
eGFR: 34 mL/min/1.73 — ABNORMAL LOW (ref >59)

## 2024-07-15 LAB — MICROALBUMIN / CREATININE URINE RATIO
Creatinine, Urine: 142.2 mg/dL
Microalb/Creat Ratio: 333 mg/g{creat} — AB (ref 0–29)
Microalbumin, Urine: 474.2 ug/mL

## 2024-07-15 LAB — LIPID PANEL
Chol/HDL Ratio: 3.8 ratio (ref 0.0–4.4)
Cholesterol, Total: 192 mg/dL (ref 100–199)
HDL: 50 mg/dL (ref >39)
LDL Chol Calc (NIH): 118 mg/dL — ABNORMAL HIGH (ref 0–99)
Triglycerides: 137 mg/dL (ref 0–149)
VLDL Cholesterol Cal: 24 mg/dL (ref 5–40)

## 2024-07-18 ENCOUNTER — Ambulatory Visit: Admitting: Pharmacist

## 2024-07-18 ENCOUNTER — Other Ambulatory Visit: Payer: Self-pay

## 2024-07-18 DIAGNOSIS — N183 Chronic kidney disease, stage 3 unspecified: Secondary | ICD-10-CM

## 2024-08-14 ENCOUNTER — Other Ambulatory Visit: Payer: Self-pay | Admitting: *Deleted

## 2024-08-14 MED ORDER — LOSARTAN POTASSIUM 100 MG PO TABS: 100.0000 mg | ORAL_TABLET | Freq: Every day | ORAL | 3 refills | Status: AC

## 2024-08-17 ENCOUNTER — Ambulatory Visit: Payer: Self-pay

## 2024-09-08 ENCOUNTER — Ambulatory Visit

## 2024-09-08 NOTE — Progress Notes (Deleted)
    SUBJECTIVE:   CHIEF COMPLAINT / HPI:   ***  PERTINENT  PMH / PSH: ***  OBJECTIVE:   There were no vitals taken for this visit.  ***  ASSESSMENT/PLAN:   Assessment & Plan      Camie Dixons, DO [facility]
